# Patient Record
Sex: Female | Born: 1945
Health system: Southern US, Community
[De-identification: ages and names within clinical notes are randomized; demographics above are authoritative.]

## PROBLEM LIST (undated history)

## (undated) DIAGNOSIS — Z8719 Personal history of other diseases of the digestive system: Secondary | ICD-10-CM

## (undated) DIAGNOSIS — R112 Nausea with vomiting, unspecified: Secondary | ICD-10-CM

## (undated) DIAGNOSIS — E669 Obesity, unspecified: Secondary | ICD-10-CM

## (undated) DIAGNOSIS — G20A1 Parkinson's disease without dyskinesia, without mention of fluctuations: Secondary | ICD-10-CM

## (undated) DIAGNOSIS — K219 Gastro-esophageal reflux disease without esophagitis: Secondary | ICD-10-CM

## (undated) DIAGNOSIS — R911 Solitary pulmonary nodule: Secondary | ICD-10-CM

## (undated) DIAGNOSIS — R32 Unspecified urinary incontinence: Secondary | ICD-10-CM

## (undated) DIAGNOSIS — F419 Anxiety disorder, unspecified: Secondary | ICD-10-CM

## (undated) DIAGNOSIS — K224 Dyskinesia of esophagus: Secondary | ICD-10-CM

## (undated) DIAGNOSIS — E785 Hyperlipidemia, unspecified: Secondary | ICD-10-CM

## (undated) DIAGNOSIS — G20 Parkinson's disease: Secondary | ICD-10-CM

## (undated) DIAGNOSIS — F329 Major depressive disorder, single episode, unspecified: Secondary | ICD-10-CM

## (undated) DIAGNOSIS — I10 Essential (primary) hypertension: Secondary | ICD-10-CM

## (undated) DIAGNOSIS — J45909 Unspecified asthma, uncomplicated: Secondary | ICD-10-CM

## (undated) DIAGNOSIS — M1990 Unspecified osteoarthritis, unspecified site: Secondary | ICD-10-CM

## (undated) DIAGNOSIS — G14 Postpolio syndrome: Secondary | ICD-10-CM

## (undated) DIAGNOSIS — G4730 Sleep apnea, unspecified: Secondary | ICD-10-CM

## (undated) DIAGNOSIS — F32A Depression, unspecified: Secondary | ICD-10-CM

## (undated) DIAGNOSIS — Z9889 Other specified postprocedural states: Secondary | ICD-10-CM

## (undated) DIAGNOSIS — B029 Zoster without complications: Secondary | ICD-10-CM

## (undated) DIAGNOSIS — C801 Malignant (primary) neoplasm, unspecified: Secondary | ICD-10-CM

## (undated) HISTORY — DX: Anxiety disorder, unspecified: F41.9

## (undated) HISTORY — DX: Solitary pulmonary nodule: R91.1

## (undated) HISTORY — DX: Essential (primary) hypertension: I10

## (undated) HISTORY — PX: APPENDECTOMY: SHX54

## (undated) HISTORY — DX: Gastro-esophageal reflux disease without esophagitis: K21.9

## (undated) HISTORY — PX: CHOLECYSTECTOMY: SHX55

## (undated) HISTORY — DX: Parkinson's disease: G20

## (undated) HISTORY — DX: Parkinson's disease without dyskinesia, without mention of fluctuations: G20.A1

## (undated) HISTORY — DX: Zoster without complications: B02.9

## (undated) HISTORY — DX: Unspecified urinary incontinence: R32

## (undated) HISTORY — DX: Hyperlipidemia, unspecified: E78.5

## (undated) HISTORY — PX: HALLUX VALGUS CORRECTION: SUR315

## (undated) HISTORY — DX: Postpolio syndrome: G14

## (undated) HISTORY — DX: Unspecified osteoarthritis, unspecified site: M19.90

## (undated) HISTORY — PX: FOOT SURGERY: SHX648

## (undated) HISTORY — PX: ABDOMINAL HYSTERECTOMY: SHX81

## (undated) HISTORY — DX: Dyskinesia of esophagus: K22.4

## (undated) HISTORY — DX: Obesity, unspecified: E66.9

## (undated) HISTORY — PX: ROTATOR CUFF REPAIR: SHX139

## (undated) HISTORY — DX: Sleep apnea, unspecified: G47.30

---
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
## 2001-01-21 HISTORY — PX: COLONOSCOPY: SHX174

## 2003-06-12 ENCOUNTER — Other Ambulatory Visit: Payer: Self-pay

## 2003-11-03 ENCOUNTER — Ambulatory Visit: Payer: Self-pay | Admitting: Endocrinology

## 2004-03-22 ENCOUNTER — Ambulatory Visit: Payer: Self-pay | Admitting: Unknown Physician Specialty

## 2004-04-04 ENCOUNTER — Ambulatory Visit: Payer: Self-pay | Admitting: Physician Assistant

## 2004-05-23 ENCOUNTER — Ambulatory Visit: Payer: Self-pay | Admitting: Unknown Physician Specialty

## 2005-02-12 ENCOUNTER — Ambulatory Visit: Payer: Self-pay | Admitting: Gastroenterology

## 2005-03-12 ENCOUNTER — Ambulatory Visit: Payer: Self-pay | Admitting: Unknown Physician Specialty

## 2005-03-12 ENCOUNTER — Other Ambulatory Visit: Payer: Self-pay

## 2005-04-04 ENCOUNTER — Other Ambulatory Visit: Payer: Self-pay

## 2005-04-04 ENCOUNTER — Ambulatory Visit: Payer: Self-pay | Admitting: Unknown Physician Specialty

## 2005-04-05 ENCOUNTER — Inpatient Hospital Stay: Payer: Self-pay | Admitting: Internal Medicine

## 2005-04-18 ENCOUNTER — Ambulatory Visit: Payer: Self-pay | Admitting: Gastroenterology

## 2005-08-07 ENCOUNTER — Emergency Department: Payer: Self-pay

## 2005-08-07 ENCOUNTER — Other Ambulatory Visit: Payer: Self-pay

## 2006-04-08 ENCOUNTER — Ambulatory Visit: Payer: Self-pay | Admitting: Internal Medicine

## 2007-01-06 ENCOUNTER — Ambulatory Visit: Payer: Self-pay | Admitting: Specialist

## 2007-01-22 DIAGNOSIS — B029 Zoster without complications: Secondary | ICD-10-CM

## 2007-01-22 HISTORY — DX: Zoster without complications: B02.9

## 2007-05-20 ENCOUNTER — Ambulatory Visit: Payer: Self-pay | Admitting: Internal Medicine

## 2007-10-21 ENCOUNTER — Ambulatory Visit: Payer: Self-pay | Admitting: Orthopedic Surgery

## 2008-03-30 ENCOUNTER — Ambulatory Visit: Payer: Self-pay | Admitting: Internal Medicine

## 2008-06-15 ENCOUNTER — Ambulatory Visit: Payer: Self-pay | Admitting: Orthopedic Surgery

## 2008-06-23 ENCOUNTER — Ambulatory Visit: Payer: Self-pay | Admitting: Orthopedic Surgery

## 2008-06-23 HISTORY — PX: KNEE ARTHROSCOPY: SUR90

## 2009-02-24 ENCOUNTER — Observation Stay: Payer: Self-pay | Admitting: *Deleted

## 2009-02-27 ENCOUNTER — Ambulatory Visit: Payer: Self-pay | Admitting: Internal Medicine

## 2009-04-04 ENCOUNTER — Ambulatory Visit: Payer: Self-pay | Admitting: Internal Medicine

## 2010-04-17 ENCOUNTER — Ambulatory Visit: Payer: Self-pay | Admitting: Family Medicine

## 2011-01-22 DIAGNOSIS — C801 Malignant (primary) neoplasm, unspecified: Secondary | ICD-10-CM

## 2011-01-22 HISTORY — DX: Malignant (primary) neoplasm, unspecified: C80.1

## 2011-02-05 ENCOUNTER — Ambulatory Visit: Payer: Self-pay | Admitting: Orthopedic Surgery

## 2011-03-04 ENCOUNTER — Ambulatory Visit: Payer: Self-pay | Admitting: Orthopedic Surgery

## 2011-03-04 LAB — BASIC METABOLIC PANEL
Anion Gap: 5 — ABNORMAL LOW (ref 7–16)
BUN: 16 mg/dL (ref 7–18)
Calcium, Total: 9.1 mg/dL (ref 8.5–10.1)
Chloride: 99 mmol/L (ref 98–107)
Co2: 33 mmol/L — ABNORMAL HIGH (ref 21–32)
Creatinine: 0.75 mg/dL (ref 0.60–1.30)
EGFR (African American): >60
EGFR (Non-African Amer.): >60
Glucose: 91 mg/dL (ref 65–99)
Osmolality: 275 (ref 275–301)
Potassium: 3.3 mmol/L — ABNORMAL LOW (ref 3.5–5.1)
Sodium: 137 mmol/L (ref 136–145)

## 2011-03-04 LAB — SEDIMENTATION RATE: Erythrocyte Sed Rate: 23 mm/hr (ref 0–30)

## 2011-03-04 LAB — MRSA PCR SCREENING

## 2011-03-04 LAB — CBC
HCT: 38.2 % (ref 35.0–47.0)
HGB: 12.5 g/dL (ref 12.0–16.0)
MCH: 28.5 pg (ref 26.0–34.0)
MCHC: 32.7 g/dL (ref 32.0–36.0)
MCV: 87 fL (ref 80–100)
Platelet: 301 10*3/uL (ref 150–440)
RBC: 4.39 10*6/uL (ref 3.80–5.20)
RDW: 12.3 % (ref 11.5–14.5)
WBC: 5.1 10*3/uL (ref 3.6–11.0)

## 2011-03-04 LAB — APTT: Activated PTT: 30.9 secs (ref 23.6–35.9)

## 2011-03-04 LAB — PROTIME-INR
INR: 0.9
Prothrombin Time: 12.4 secs (ref 11.5–14.7)

## 2011-03-12 ENCOUNTER — Inpatient Hospital Stay: Payer: Self-pay | Admitting: Orthopedic Surgery

## 2011-03-12 LAB — CREATININE, SERUM
Creatinine: 0.78 mg/dL (ref 0.60–1.30)
EGFR (African American): >60
EGFR (Non-African Amer.): >60

## 2011-03-12 LAB — HEMOGLOBIN: HGB: 10.9 g/dL — ABNORMAL LOW (ref 12.0–16.0)

## 2011-03-12 LAB — POTASSIUM: Potassium: 3.9 mmol/L (ref 3.5–5.1)

## 2011-03-13 LAB — BASIC METABOLIC PANEL
Anion Gap: 8 (ref 7–16)
BUN: 10 mg/dL (ref 7–18)
Calcium, Total: 7.8 mg/dL — ABNORMAL LOW (ref 8.5–10.1)
Chloride: 101 mmol/L (ref 98–107)
Co2: 29 mmol/L (ref 21–32)
Creatinine: 0.65 mg/dL (ref 0.60–1.30)
EGFR (African American): >60
EGFR (Non-African Amer.): >60
Glucose: 119 mg/dL — ABNORMAL HIGH (ref 65–99)
Osmolality: 276 (ref 275–301)
Potassium: 3.2 mmol/L — ABNORMAL LOW (ref 3.5–5.1)
Sodium: 138 mmol/L (ref 136–145)

## 2011-03-13 LAB — PLATELET COUNT: Platelet: 200 10*3/uL (ref 150–440)

## 2011-03-14 ENCOUNTER — Encounter: Payer: Self-pay | Admitting: Internal Medicine

## 2011-03-14 LAB — PATHOLOGY REPORT

## 2011-03-14 LAB — POTASSIUM: Potassium: 3.4 mmol/L — ABNORMAL LOW (ref 3.5–5.1)

## 2011-03-15 LAB — POTASSIUM: Potassium: 3.8 mmol/L (ref 3.5–5.1)

## 2011-03-22 ENCOUNTER — Encounter: Payer: Self-pay | Admitting: Internal Medicine

## 2011-03-26 LAB — COMPREHENSIVE METABOLIC PANEL
Albumin: 3.2 g/dL — ABNORMAL LOW (ref 3.4–5.0)
Alkaline Phosphatase: 103 U/L (ref 50–136)
Anion Gap: 7 (ref 7–16)
BUN: 15 mg/dL (ref 7–18)
Bilirubin,Total: 0.3 mg/dL (ref 0.2–1.0)
Calcium, Total: 9.2 mg/dL (ref 8.5–10.1)
Chloride: 99 mmol/L (ref 98–107)
Co2: 32 mmol/L (ref 21–32)
Creatinine: 0.75 mg/dL (ref 0.60–1.30)
EGFR (African American): >60
EGFR (Non-African Amer.): >60
Glucose: 90 mg/dL (ref 65–99)
Osmolality: 276 (ref 275–301)
Potassium: 4 mmol/L (ref 3.5–5.1)
SGOT(AST): 20 U/L (ref 15–37)
SGPT (ALT): 20 U/L
Sodium: 138 mmol/L (ref 136–145)
Total Protein: 6.5 g/dL (ref 6.4–8.2)

## 2011-04-22 ENCOUNTER — Encounter: Payer: Self-pay | Admitting: Internal Medicine

## 2011-05-07 ENCOUNTER — Ambulatory Visit: Payer: Self-pay | Admitting: Family Medicine

## 2011-05-22 ENCOUNTER — Encounter: Payer: Self-pay | Admitting: Internal Medicine

## 2011-06-18 ENCOUNTER — Ambulatory Visit: Payer: Self-pay

## 2011-06-22 ENCOUNTER — Encounter: Payer: Self-pay | Admitting: Internal Medicine

## 2011-08-22 HISTORY — PX: ESOPHAGOGASTRODUODENOSCOPY: SHX1529

## 2011-08-30 ENCOUNTER — Ambulatory Visit: Payer: Self-pay | Admitting: Gastroenterology

## 2011-09-02 LAB — PATHOLOGY REPORT

## 2012-01-22 HISTORY — PX: URINARY SURGERY: SHX2626

## 2012-05-06 ENCOUNTER — Ambulatory Visit: Payer: Self-pay | Admitting: Family Medicine

## 2012-11-05 DIAGNOSIS — K219 Gastro-esophageal reflux disease without esophagitis: Secondary | ICD-10-CM

## 2012-11-05 DIAGNOSIS — R911 Solitary pulmonary nodule: Secondary | ICD-10-CM

## 2012-11-05 HISTORY — DX: Solitary pulmonary nodule: R91.1

## 2012-11-05 HISTORY — DX: Gastro-esophageal reflux disease without esophagitis: K21.9

## 2012-12-21 HISTORY — PX: CATARACT EXTRACTION: SUR2

## 2013-01-21 DIAGNOSIS — J45909 Unspecified asthma, uncomplicated: Secondary | ICD-10-CM

## 2013-01-21 HISTORY — DX: Unspecified asthma, uncomplicated: J45.909

## 2013-05-06 ENCOUNTER — Ambulatory Visit: Payer: Self-pay | Admitting: Family Medicine

## 2014-01-24 DIAGNOSIS — S82035D Nondisplaced transverse fracture of left patella, subsequent encounter for closed fracture with routine healing: Secondary | ICD-10-CM | POA: Diagnosis not present

## 2014-01-31 DIAGNOSIS — H1032 Unspecified acute conjunctivitis, left eye: Secondary | ICD-10-CM | POA: Diagnosis not present

## 2014-03-09 DIAGNOSIS — R000 Tachycardia, unspecified: Secondary | ICD-10-CM | POA: Diagnosis not present

## 2014-03-09 DIAGNOSIS — I10 Essential (primary) hypertension: Secondary | ICD-10-CM | POA: Diagnosis not present

## 2014-03-09 DIAGNOSIS — R002 Palpitations: Secondary | ICD-10-CM | POA: Diagnosis not present

## 2014-03-09 DIAGNOSIS — F331 Major depressive disorder, recurrent, moderate: Secondary | ICD-10-CM | POA: Diagnosis not present

## 2014-03-09 DIAGNOSIS — G4700 Insomnia, unspecified: Secondary | ICD-10-CM | POA: Diagnosis not present

## 2014-03-09 DIAGNOSIS — R5383 Other fatigue: Secondary | ICD-10-CM | POA: Diagnosis not present

## 2014-03-09 DIAGNOSIS — Z0000 Encounter for general adult medical examination without abnormal findings: Secondary | ICD-10-CM | POA: Diagnosis not present

## 2014-03-10 DIAGNOSIS — R002 Palpitations: Secondary | ICD-10-CM | POA: Diagnosis not present

## 2014-03-10 DIAGNOSIS — R0602 Shortness of breath: Secondary | ICD-10-CM | POA: Diagnosis not present

## 2014-03-10 DIAGNOSIS — R079 Chest pain, unspecified: Secondary | ICD-10-CM | POA: Diagnosis not present

## 2014-03-10 DIAGNOSIS — R000 Tachycardia, unspecified: Secondary | ICD-10-CM | POA: Diagnosis not present

## 2014-03-11 DIAGNOSIS — L570 Actinic keratosis: Secondary | ICD-10-CM | POA: Diagnosis not present

## 2014-03-11 DIAGNOSIS — Z85828 Personal history of other malignant neoplasm of skin: Secondary | ICD-10-CM | POA: Diagnosis not present

## 2014-03-11 DIAGNOSIS — L700 Acne vulgaris: Secondary | ICD-10-CM | POA: Diagnosis not present

## 2014-03-11 DIAGNOSIS — Z08 Encounter for follow-up examination after completed treatment for malignant neoplasm: Secondary | ICD-10-CM | POA: Diagnosis not present

## 2014-03-22 HISTORY — PX: RETINAL DETACHMENT SURGERY: SHX105

## 2014-03-28 DIAGNOSIS — I10 Essential (primary) hypertension: Secondary | ICD-10-CM | POA: Diagnosis not present

## 2014-03-28 DIAGNOSIS — M150 Primary generalized (osteo)arthritis: Secondary | ICD-10-CM | POA: Diagnosis not present

## 2014-03-28 DIAGNOSIS — G4700 Insomnia, unspecified: Secondary | ICD-10-CM | POA: Diagnosis not present

## 2014-03-28 DIAGNOSIS — F331 Major depressive disorder, recurrent, moderate: Secondary | ICD-10-CM | POA: Diagnosis not present

## 2014-03-28 DIAGNOSIS — R000 Tachycardia, unspecified: Secondary | ICD-10-CM | POA: Diagnosis not present

## 2014-03-28 DIAGNOSIS — N959 Unspecified menopausal and perimenopausal disorder: Secondary | ICD-10-CM | POA: Diagnosis not present

## 2014-04-01 DIAGNOSIS — L853 Xerosis cutis: Secondary | ICD-10-CM | POA: Diagnosis not present

## 2014-04-01 DIAGNOSIS — B078 Other viral warts: Secondary | ICD-10-CM | POA: Diagnosis not present

## 2014-04-01 DIAGNOSIS — S20409A Unspecified superficial injuries of unspecified back wall of thorax, initial encounter: Secondary | ICD-10-CM | POA: Diagnosis not present

## 2014-04-01 DIAGNOSIS — Z08 Encounter for follow-up examination after completed treatment for malignant neoplasm: Secondary | ICD-10-CM | POA: Diagnosis not present

## 2014-04-01 DIAGNOSIS — D2271 Melanocytic nevi of right lower limb, including hip: Secondary | ICD-10-CM | POA: Diagnosis not present

## 2014-04-01 DIAGNOSIS — L570 Actinic keratosis: Secondary | ICD-10-CM | POA: Diagnosis not present

## 2014-04-01 DIAGNOSIS — Z85828 Personal history of other malignant neoplasm of skin: Secondary | ICD-10-CM | POA: Diagnosis not present

## 2014-04-01 DIAGNOSIS — L578 Other skin changes due to chronic exposure to nonionizing radiation: Secondary | ICD-10-CM | POA: Diagnosis not present

## 2014-04-01 DIAGNOSIS — L821 Other seborrheic keratosis: Secondary | ICD-10-CM | POA: Diagnosis not present

## 2014-04-20 DIAGNOSIS — H43811 Vitreous degeneration, right eye: Secondary | ICD-10-CM | POA: Diagnosis not present

## 2014-04-29 DIAGNOSIS — H338 Other retinal detachments: Secondary | ICD-10-CM | POA: Diagnosis not present

## 2014-05-02 DIAGNOSIS — H3322 Serous retinal detachment, left eye: Secondary | ICD-10-CM | POA: Diagnosis not present

## 2014-05-02 DIAGNOSIS — H33051 Total retinal detachment, right eye: Secondary | ICD-10-CM | POA: Diagnosis not present

## 2014-05-02 DIAGNOSIS — H3341 Traction detachment of retina, right eye: Secondary | ICD-10-CM | POA: Diagnosis not present

## 2014-05-02 DIAGNOSIS — I10 Essential (primary) hypertension: Secondary | ICD-10-CM | POA: Diagnosis not present

## 2014-05-15 NOTE — Op Note (Signed)
PATIENT NAME:  Susan Shah, Susan Shah MR#:  [id number] DATE OF BIRTH:  04-02-1945  DATE OF PROCEDURE:  03/12/2011  PREOPERATIVE DIAGNOSIS: Severe left knee osteoarthritis.   POSTOPERATIVE DIAGNOSIS: Severe left knee osteoarthritis.   PROCEDURE: Left total knee replacement.   SURGEON: Laurene Footman, MD  ASSISTANT: Dorthula Matas, PA-C.   ANESTHESIA: General.   IMPLANTS: [place] system with a GMK primary tibial tray size 2, a left PS femoral component with a size 1 patella and a size 2, 14 mm PS tibial insert. All components cemented with Simplex P bone cement.   DESCRIPTION OF PROCEDURE: Patient was brought to the Operating Room and after adequate anesthesia was obtained the leg was prepped and draped in the usual sterile fashion with tourniquet applied to the upper thigh. After appropriate patient identification and timeout procedures were completed, the leg was exsanguinated with use of an Esmarch and the tourniquet raised. Following this, a midline skin incision was made with the knee in flexion and a medial parapatellar arthrotomy carried out. Inspection of the knee revealed sclerosis of the medial compartment and extensive wear and exposed bone on both the lateral facet of the patella and trochlea. The lateral compartment also had significant involvement and there was moderate synovitis within the joint. After having completed this initial evaluation the anterior cruciate ligament was excised along with the infrapatellar fat pad and the tibial cut carried out after obtaining adequate exposure releasing the anterior horn of the menisci. The proximal tibial cutting guide from the MyKnee system was utilized and based on bony landmarks off the template drill holes were made proximally and pins placed. The proximal tibia cut was carried out after checking it against the bone model and appeared appropriate. Proximal tibia cut carried out and the tibial bone removed. The femur was approached in a  similar fashion exposing the proximal femur, again with moderate synovitis present. The distal femoral pins were placed as well as anterior and distal and the distal femoral cut carried out. The femur was preoperatively templated to a 3 and this appeared appropriate. The 3 cutting block was applied. Anterior and posterior and chamfer cuts carried out. At this point, the residual posterior horns of the menisci were excised along with the posterior cruciate ligament which appeared somewhat deficient. The tibia sized to a size 2 consistent with templating and the trial placed with the appropriate rotation based on pin left in place from the initial cutting block. With the release of the posterior cruciate ligament and a pre-existing significant lateral collateral laxity a 14 mm spacer was utilized and this gave good stability with trialing. The notch cut was made for the trochlea of the femur and distal femoral drill holes made. At this point, these trial components were removed. The patella was cut with the patellar guide and sized to a size 1 with the trial fitting well. All trial components having been removed at this point the tourniquet was let down to check for hemostasis. Capsular bleeding was noted and cauterized. The tourniquet was raised again for cement fixation. The bony surfaces were thoroughly irrigated and dried. The tibial component was cemented into place first with excess cement removed followed by placing of the 14 mm PS tibial insert with a setscrew with the system. The femoral component was then cemented into place with that leg held in extension. The cement set with the patellar button clamped in place again using bone cement. After the cement was completely hard excess cement was removed with  use of a small osteotome and the knee again thoroughly irrigated. The knee was stable through a range of motion. Full extension was obtained just passively. At the start of the case there had been some  hyperextension of the knee and the knee was out to full extension but not hyperextending. The patella tracked well and the arthrotomy was then closed using a heavy quill suture followed by 2-0 quill subcutaneously and skin staples. Xeroform, 4 x 4's, Webril, Polar Care, Ace wrap, and immobilizer were applied and the patient was sent to recovery in stable condition.   TOTAL TOURNIQUET TIME: 88 minutes.   ESTIMATED BLOOD LOSS: 50 mL.  IV FLUID: 1000.   URINE OUTPUT: 130.   COMPLICATIONS: None.   SPECIMEN: Cut ends of bone. ____________________________ Laurene Footman, MD mjm:cms D: 03/12/2011 20:41:06 ET T: 03/13/2011 08:46:20 ET  JOB#: [id number] cc: Laurene Footman, MD, <Dictator> Laurene Footman MD ELECTRONICALLY SIGNED 03/13/2011 12:39

## 2014-05-15 NOTE — Discharge Summary (Signed)
PATIENT NAME:  Susan Shah, Susan Shah MR#:  [id number] DATE OF BIRTH:  07-Feb-1945  DATE OF ADMISSION:  03/12/2011 DATE OF DISCHARGE:  03/15/2011  ADMITTING DIAGNOSIS: Status post left total knee replacement for degenerative arthritis.   DISCHARGE DIAGNOSES:  1. Status post left total knee replacement for degenerative arthritis.  2. Sore throat.  3. Hypokalemia, improved.   ATTENDING: Dr. Hessie Knows with [facility] orthopedics.   PROCEDURE: On 02/19 the patient underwent left total knee arthroplasty by Dr. Rudene Christians with assistant Dorthula Matas, PA-C.   ANESTHESIA: General with femoral nerve block.   ESTIMATED BLOOD LOSS: 50 mL.  TOURNIQUET TIME: 88 minutes.   SPECIMENS SENT: Cut ends of bone.   OPERATIVE FINDINGS: Severe tricompartmental degenerative joint disease with sclerosis medially. Also inflammatory component.   IMPLANTS: [place] MyKnee .  DRAINS: No drains were placed.   COMPLICATIONS: No complications occurred.   PATIENT HISTORY: Susan Shah is a 69 year old with persistent left knee pain secondary to osteoarthritis. She has had steroid injection and Synvisc in the past without relief as well as physical therapy. Again, she had only minimal relief although she states she feels like she is getting somewhat stronger. Dr. Rudene Christians had discussed with physical therapist that Susan Shah has a total knee replacement. She will probably need to go to rehab because of her history of polio with some profound right leg weakness and hip abduction weakness on the left. She may need parallel bar training to be able to get ambulatory again with crutches.   ALLERGIES AND ADVERSE REACTIONS: [place].   PAST MEDICAL HISTORY:  1. Hypertension. 2. Anxiety.  3. Hyperlipidemia.  4. Shingles.  5. History of polio with profound right leg weakness.   PHYSICAL EXAMINATION: HEART: Regular rate and rhythm. LUNGS: Clear to auscultation. LEFT KNEE: Range of motion 0 to 90 degrees with significant pain. She  has varus deformity that is passively correctable. She has intact distal pulses of the left foot that are 1+. No clonus sign. No unnecessary edema. Sensation to the foot is intact. Of note, she has no pain with hip rotation.   HOSPITAL COURSE: Patient underwent aforementioned procedure on 02/19 and was transferred to the postanesthesia care unit then the orthopedic floor in stable condition. On first day postop patient was tolerating her diet already. She was treated with Xarelto, TED hose and compression boots for deep vein thrombosis prophylaxis. Patient's potassium was somewhat low and she had to be supplemented  couple of days with some more potassium. She is regularly on 10 mEq a day at home. Her potassium would improve to within normal limits and would be 3.8 on the 22nd. Her hemoglobin had been 10.9 on the 19th but this was not followed because the patient is a Jehovah's Witness and would refuse a blood transfusion. Patient really had a stable hospital course. She passed some stool on the 22nd. She worked multiple times with physical therapy while here and ambulated up to 150 feet but was not able to do stairs.   I did see the patient on the 22nd. She was in no acute distress, awake, alert, lying in bed. Her left knee dressing was intact with only a little bit of bloody drainage. Her calf was not significantly tender. Distally she was able to move her foot well but had a bit of numbness which she does have a history of.   CONDITION AT DISCHARGE: Stable.   DISPOSITION: [facility].   DISCHARGE MEDICATIONS:  1. Oxycodone 5 to 10 mg  every four hours as needed for pain.  2. Ambien 5 mg oral at bedtime as needed for insomnia.  3. Dulcolax 10 mg rectal daily as needed for constipation.  4. Milk of magnesia 30 mL oral twice a day as needed for constipation.  5. Antacid DS suspension 30 mL oral every six hours as needed for heartburn.  6. Senokot-S 1 tablet oral twice a day. 7. Oxybutynin ER  tablet 10 mg oral daily. 8. Lopressor 25 mg oral twice a day. 9. Hydrochlorothiazide/losartan 12.5/50 mg 2 tablets per oral every morning. 10. Simvastatin 20 mg oral at bedtime. 11. Vitamin B complex with iron and intrinsic factor 1 capsule oral twice a day with meals.  12. Citalopram 20 mg oral daily. 13. Zetia 10 mg oral at bedtime.  14. Celebrex 200 mg per oral twice a day as needed for pain.  15. Nexium 40 mg oral daily  16. Lotrisone cream apply to affected area twice a day as needed for rash.  17. Tylenol 500 to 1000 mg every six hours as needed for pain or fever.  18. K-Dur 10 mEq daily per oral. 19. Xarelto 10 mg daily, stop in 10 days from 02/21.  20. Menthol lozenge 1 to 2 q.6 hours as needed for cough or sore throat.   DISCHARGE INSTRUCTIONS AND FOLLOW UP:  1. Sterile dressing changes to the left knee as needed for drainage.  2. Universal skin precautions.  3. TED hose thigh-high are to be worn daily four weeks from surgery date.  4. Apply Polar Care to left knee while patient is sitting or lying down.  5. Regular diet.  6. Physical therapy consult. Weight-bearing as tolerated on the surgical leg.  7. The patient is to follow up at [facility] on 03/05 at 2:15 p.m. for left knee staple removal. If there are any questions, please call our office.  ____________________________ Jerrel Ivory. Jadie Comas, [place] jrp:cms D: 03/15/2011 08:00:12 ET T: 03/15/2011 08:13:53 ET JOB#: [id number] cc: Jerrel Ivory. Charlett Nose, [place], <Dictator> [place] ELECTRONICALLY SIGNED 03/15/2011 15:41

## 2014-05-23 DIAGNOSIS — I10 Essential (primary) hypertension: Secondary | ICD-10-CM | POA: Diagnosis not present

## 2014-05-23 DIAGNOSIS — F331 Major depressive disorder, recurrent, moderate: Secondary | ICD-10-CM | POA: Diagnosis not present

## 2014-05-23 DIAGNOSIS — G479 Sleep disorder, unspecified: Secondary | ICD-10-CM | POA: Diagnosis not present

## 2014-05-23 DIAGNOSIS — Z0001 Encounter for general adult medical examination with abnormal findings: Secondary | ICD-10-CM | POA: Diagnosis not present

## 2014-05-23 DIAGNOSIS — E782 Mixed hyperlipidemia: Secondary | ICD-10-CM | POA: Diagnosis not present

## 2014-05-23 DIAGNOSIS — K219 Gastro-esophageal reflux disease without esophagitis: Secondary | ICD-10-CM | POA: Diagnosis not present

## 2014-05-24 DIAGNOSIS — H33001 Unspecified retinal detachment with retinal break, right eye: Secondary | ICD-10-CM | POA: Diagnosis not present

## 2014-05-25 DIAGNOSIS — H33051 Total retinal detachment, right eye: Secondary | ICD-10-CM | POA: Diagnosis not present

## 2014-05-25 DIAGNOSIS — H3521 Other non-diabetic proliferative retinopathy, right eye: Secondary | ICD-10-CM | POA: Diagnosis not present

## 2014-05-25 DIAGNOSIS — Z961 Presence of intraocular lens: Secondary | ICD-10-CM | POA: Diagnosis not present

## 2014-06-01 DIAGNOSIS — Z1231 Encounter for screening mammogram for malignant neoplasm of breast: Secondary | ICD-10-CM | POA: Diagnosis not present

## 2014-06-07 DIAGNOSIS — G479 Sleep disorder, unspecified: Secondary | ICD-10-CM | POA: Diagnosis not present

## 2014-06-07 DIAGNOSIS — G4733 Obstructive sleep apnea (adult) (pediatric): Secondary | ICD-10-CM | POA: Diagnosis not present

## 2014-06-15 DIAGNOSIS — M898X1 Other specified disorders of bone, shoulder: Secondary | ICD-10-CM | POA: Diagnosis not present

## 2014-06-15 DIAGNOSIS — M25521 Pain in right elbow: Secondary | ICD-10-CM | POA: Diagnosis not present

## 2014-06-15 DIAGNOSIS — Z961 Presence of intraocular lens: Secondary | ICD-10-CM | POA: Diagnosis not present

## 2014-06-15 DIAGNOSIS — H33051 Total retinal detachment, right eye: Secondary | ICD-10-CM | POA: Diagnosis not present

## 2014-06-15 DIAGNOSIS — H26491 Other secondary cataract, right eye: Secondary | ICD-10-CM | POA: Diagnosis not present

## 2014-06-15 DIAGNOSIS — M19021 Primary osteoarthritis, right elbow: Secondary | ICD-10-CM | POA: Diagnosis not present

## 2014-06-16 DIAGNOSIS — F331 Major depressive disorder, recurrent, moderate: Secondary | ICD-10-CM | POA: Diagnosis not present

## 2014-06-16 DIAGNOSIS — I10 Essential (primary) hypertension: Secondary | ICD-10-CM | POA: Diagnosis not present

## 2014-06-16 DIAGNOSIS — G4733 Obstructive sleep apnea (adult) (pediatric): Secondary | ICD-10-CM | POA: Diagnosis not present

## 2014-06-16 DIAGNOSIS — M19011 Primary osteoarthritis, right shoulder: Secondary | ICD-10-CM | POA: Diagnosis not present

## 2014-06-21 DIAGNOSIS — M7581 Other shoulder lesions, right shoulder: Secondary | ICD-10-CM | POA: Diagnosis not present

## 2014-06-21 DIAGNOSIS — M7701 Medial epicondylitis, right elbow: Secondary | ICD-10-CM | POA: Diagnosis not present

## 2014-07-05 DIAGNOSIS — G4733 Obstructive sleep apnea (adult) (pediatric): Secondary | ICD-10-CM | POA: Diagnosis not present

## 2014-07-07 DIAGNOSIS — F331 Major depressive disorder, recurrent, moderate: Secondary | ICD-10-CM | POA: Diagnosis not present

## 2014-07-07 DIAGNOSIS — G4700 Insomnia, unspecified: Secondary | ICD-10-CM | POA: Diagnosis not present

## 2014-07-07 DIAGNOSIS — G4733 Obstructive sleep apnea (adult) (pediatric): Secondary | ICD-10-CM | POA: Diagnosis not present

## 2014-07-07 DIAGNOSIS — I10 Essential (primary) hypertension: Secondary | ICD-10-CM | POA: Diagnosis not present

## 2014-07-07 DIAGNOSIS — M150 Primary generalized (osteo)arthritis: Secondary | ICD-10-CM | POA: Diagnosis not present

## 2014-07-15 DIAGNOSIS — Z961 Presence of intraocular lens: Secondary | ICD-10-CM | POA: Diagnosis not present

## 2014-07-15 DIAGNOSIS — H33051 Total retinal detachment, right eye: Secondary | ICD-10-CM | POA: Diagnosis not present

## 2014-07-20 DIAGNOSIS — R0602 Shortness of breath: Secondary | ICD-10-CM | POA: Diagnosis not present

## 2014-07-27 DIAGNOSIS — F329 Major depressive disorder, single episode, unspecified: Secondary | ICD-10-CM | POA: Diagnosis not present

## 2014-07-27 DIAGNOSIS — I10 Essential (primary) hypertension: Secondary | ICD-10-CM | POA: Diagnosis not present

## 2014-07-27 DIAGNOSIS — E669 Obesity, unspecified: Secondary | ICD-10-CM | POA: Diagnosis not present

## 2014-07-27 DIAGNOSIS — K219 Gastro-esophageal reflux disease without esophagitis: Secondary | ICD-10-CM | POA: Diagnosis not present

## 2014-08-01 DIAGNOSIS — G4733 Obstructive sleep apnea (adult) (pediatric): Secondary | ICD-10-CM | POA: Diagnosis not present

## 2014-08-08 DIAGNOSIS — Z961 Presence of intraocular lens: Secondary | ICD-10-CM | POA: Diagnosis not present

## 2014-08-08 DIAGNOSIS — H26491 Other secondary cataract, right eye: Secondary | ICD-10-CM | POA: Diagnosis not present

## 2014-08-08 DIAGNOSIS — H338 Other retinal detachments: Secondary | ICD-10-CM | POA: Diagnosis not present

## 2014-08-18 DIAGNOSIS — I10 Essential (primary) hypertension: Secondary | ICD-10-CM | POA: Diagnosis not present

## 2014-08-18 DIAGNOSIS — G4733 Obstructive sleep apnea (adult) (pediatric): Secondary | ICD-10-CM | POA: Diagnosis not present

## 2014-08-18 DIAGNOSIS — G4700 Insomnia, unspecified: Secondary | ICD-10-CM | POA: Diagnosis not present

## 2014-09-13 DIAGNOSIS — H26491 Other secondary cataract, right eye: Secondary | ICD-10-CM | POA: Diagnosis not present

## 2014-10-31 DIAGNOSIS — Z23 Encounter for immunization: Secondary | ICD-10-CM | POA: Diagnosis not present

## 2014-12-08 DIAGNOSIS — J45991 Cough variant asthma: Secondary | ICD-10-CM | POA: Diagnosis not present

## 2014-12-08 DIAGNOSIS — G14 Postpolio syndrome: Secondary | ICD-10-CM | POA: Diagnosis not present

## 2014-12-08 DIAGNOSIS — G4700 Insomnia, unspecified: Secondary | ICD-10-CM | POA: Diagnosis not present

## 2014-12-08 DIAGNOSIS — G4733 Obstructive sleep apnea (adult) (pediatric): Secondary | ICD-10-CM | POA: Diagnosis not present

## 2014-12-28 DIAGNOSIS — I10 Essential (primary) hypertension: Secondary | ICD-10-CM | POA: Diagnosis not present

## 2014-12-28 DIAGNOSIS — F331 Major depressive disorder, recurrent, moderate: Secondary | ICD-10-CM | POA: Diagnosis not present

## 2014-12-28 DIAGNOSIS — E782 Mixed hyperlipidemia: Secondary | ICD-10-CM | POA: Diagnosis not present

## 2014-12-28 DIAGNOSIS — G4700 Insomnia, unspecified: Secondary | ICD-10-CM | POA: Diagnosis not present

## 2015-01-10 DIAGNOSIS — H903 Sensorineural hearing loss, bilateral: Secondary | ICD-10-CM | POA: Diagnosis not present

## 2015-02-01 DIAGNOSIS — Z0001 Encounter for general adult medical examination with abnormal findings: Secondary | ICD-10-CM | POA: Diagnosis not present

## 2015-02-01 DIAGNOSIS — I10 Essential (primary) hypertension: Secondary | ICD-10-CM | POA: Diagnosis not present

## 2015-02-01 DIAGNOSIS — E782 Mixed hyperlipidemia: Secondary | ICD-10-CM | POA: Diagnosis not present

## 2015-02-01 DIAGNOSIS — E559 Vitamin D deficiency, unspecified: Secondary | ICD-10-CM | POA: Diagnosis not present

## 2015-02-06 DIAGNOSIS — R399 Unspecified symptoms and signs involving the genitourinary system: Secondary | ICD-10-CM | POA: Diagnosis not present

## 2015-02-06 DIAGNOSIS — N390 Urinary tract infection, site not specified: Secondary | ICD-10-CM | POA: Diagnosis not present

## 2015-02-15 DIAGNOSIS — G4733 Obstructive sleep apnea (adult) (pediatric): Secondary | ICD-10-CM | POA: Diagnosis not present

## 2015-02-22 DIAGNOSIS — R399 Unspecified symptoms and signs involving the genitourinary system: Secondary | ICD-10-CM | POA: Diagnosis not present

## 2015-04-13 DIAGNOSIS — G4733 Obstructive sleep apnea (adult) (pediatric): Secondary | ICD-10-CM | POA: Diagnosis not present

## 2015-04-13 DIAGNOSIS — G14 Postpolio syndrome: Secondary | ICD-10-CM | POA: Diagnosis not present

## 2015-04-13 DIAGNOSIS — J45991 Cough variant asthma: Secondary | ICD-10-CM | POA: Diagnosis not present

## 2015-05-30 DIAGNOSIS — Z0001 Encounter for general adult medical examination with abnormal findings: Secondary | ICD-10-CM | POA: Diagnosis not present

## 2015-05-30 DIAGNOSIS — E782 Mixed hyperlipidemia: Secondary | ICD-10-CM | POA: Diagnosis not present

## 2015-05-30 DIAGNOSIS — G4700 Insomnia, unspecified: Secondary | ICD-10-CM | POA: Diagnosis not present

## 2015-05-30 DIAGNOSIS — I10 Essential (primary) hypertension: Secondary | ICD-10-CM | POA: Diagnosis not present

## 2015-05-30 DIAGNOSIS — N393 Stress incontinence (female) (male): Secondary | ICD-10-CM | POA: Diagnosis not present

## 2015-05-30 DIAGNOSIS — F331 Major depressive disorder, recurrent, moderate: Secondary | ICD-10-CM | POA: Diagnosis not present

## 2015-07-28 DIAGNOSIS — M25542 Pain in joints of left hand: Secondary | ICD-10-CM | POA: Diagnosis not present

## 2015-07-28 DIAGNOSIS — M1812 Unilateral primary osteoarthritis of first carpometacarpal joint, left hand: Secondary | ICD-10-CM | POA: Diagnosis not present

## 2015-07-28 DIAGNOSIS — G5602 Carpal tunnel syndrome, left upper limb: Secondary | ICD-10-CM | POA: Diagnosis not present

## 2015-08-04 DIAGNOSIS — M545 Low back pain: Secondary | ICD-10-CM | POA: Diagnosis not present

## 2015-08-07 DIAGNOSIS — E786 Lipoprotein deficiency: Secondary | ICD-10-CM | POA: Diagnosis not present

## 2015-08-07 DIAGNOSIS — M1990 Unspecified osteoarthritis, unspecified site: Secondary | ICD-10-CM | POA: Diagnosis not present

## 2015-08-07 DIAGNOSIS — E669 Obesity, unspecified: Secondary | ICD-10-CM | POA: Diagnosis not present

## 2015-08-07 DIAGNOSIS — G14 Postpolio syndrome: Secondary | ICD-10-CM | POA: Diagnosis not present

## 2015-08-07 DIAGNOSIS — K219 Gastro-esophageal reflux disease without esophagitis: Secondary | ICD-10-CM | POA: Diagnosis not present

## 2015-08-07 DIAGNOSIS — E785 Hyperlipidemia, unspecified: Secondary | ICD-10-CM | POA: Diagnosis not present

## 2015-08-07 DIAGNOSIS — I10 Essential (primary) hypertension: Secondary | ICD-10-CM | POA: Diagnosis not present

## 2015-08-07 DIAGNOSIS — F329 Major depressive disorder, single episode, unspecified: Secondary | ICD-10-CM | POA: Diagnosis not present

## 2015-08-11 DIAGNOSIS — S233XXA Sprain of ligaments of thoracic spine, initial encounter: Secondary | ICD-10-CM | POA: Diagnosis not present

## 2015-08-11 DIAGNOSIS — M9903 Segmental and somatic dysfunction of lumbar region: Secondary | ICD-10-CM | POA: Diagnosis not present

## 2015-08-11 DIAGNOSIS — S335XXA Sprain of ligaments of lumbar spine, initial encounter: Secondary | ICD-10-CM | POA: Diagnosis not present

## 2015-08-11 DIAGNOSIS — M9902 Segmental and somatic dysfunction of thoracic region: Secondary | ICD-10-CM | POA: Diagnosis not present

## 2015-08-15 DIAGNOSIS — M9903 Segmental and somatic dysfunction of lumbar region: Secondary | ICD-10-CM | POA: Diagnosis not present

## 2015-08-15 DIAGNOSIS — S233XXA Sprain of ligaments of thoracic spine, initial encounter: Secondary | ICD-10-CM | POA: Diagnosis not present

## 2015-08-15 DIAGNOSIS — M9902 Segmental and somatic dysfunction of thoracic region: Secondary | ICD-10-CM | POA: Diagnosis not present

## 2015-08-15 DIAGNOSIS — S335XXA Sprain of ligaments of lumbar spine, initial encounter: Secondary | ICD-10-CM | POA: Diagnosis not present

## 2015-08-29 DIAGNOSIS — M9903 Segmental and somatic dysfunction of lumbar region: Secondary | ICD-10-CM | POA: Diagnosis not present

## 2015-08-29 DIAGNOSIS — S335XXA Sprain of ligaments of lumbar spine, initial encounter: Secondary | ICD-10-CM | POA: Diagnosis not present

## 2015-08-29 DIAGNOSIS — S233XXA Sprain of ligaments of thoracic spine, initial encounter: Secondary | ICD-10-CM | POA: Diagnosis not present

## 2015-08-29 DIAGNOSIS — M9902 Segmental and somatic dysfunction of thoracic region: Secondary | ICD-10-CM | POA: Diagnosis not present

## 2015-09-04 DIAGNOSIS — R1310 Dysphagia, unspecified: Secondary | ICD-10-CM | POA: Diagnosis not present

## 2015-09-04 DIAGNOSIS — I10 Essential (primary) hypertension: Secondary | ICD-10-CM | POA: Diagnosis not present

## 2015-09-04 DIAGNOSIS — N393 Stress incontinence (female) (male): Secondary | ICD-10-CM | POA: Diagnosis not present

## 2015-09-04 DIAGNOSIS — K219 Gastro-esophageal reflux disease without esophagitis: Secondary | ICD-10-CM | POA: Diagnosis not present

## 2015-09-05 ENCOUNTER — Other Ambulatory Visit: Payer: Self-pay | Admitting: Nurse Practitioner

## 2015-09-05 DIAGNOSIS — S233XXA Sprain of ligaments of thoracic spine, initial encounter: Secondary | ICD-10-CM | POA: Diagnosis not present

## 2015-09-05 DIAGNOSIS — M9902 Segmental and somatic dysfunction of thoracic region: Secondary | ICD-10-CM | POA: Diagnosis not present

## 2015-09-05 DIAGNOSIS — S335XXA Sprain of ligaments of lumbar spine, initial encounter: Secondary | ICD-10-CM | POA: Diagnosis not present

## 2015-09-05 DIAGNOSIS — M9903 Segmental and somatic dysfunction of lumbar region: Secondary | ICD-10-CM | POA: Diagnosis not present

## 2015-09-05 DIAGNOSIS — K219 Gastro-esophageal reflux disease without esophagitis: Secondary | ICD-10-CM

## 2015-09-11 ENCOUNTER — Ambulatory Visit: Payer: Self-pay

## 2015-09-12 DIAGNOSIS — E2839 Other primary ovarian failure: Secondary | ICD-10-CM | POA: Diagnosis not present

## 2015-09-12 DIAGNOSIS — M79645 Pain in left finger(s): Secondary | ICD-10-CM | POA: Diagnosis not present

## 2015-09-12 DIAGNOSIS — S60052A Contusion of left little finger without damage to nail, initial encounter: Secondary | ICD-10-CM | POA: Diagnosis not present

## 2015-09-12 DIAGNOSIS — Z1231 Encounter for screening mammogram for malignant neoplasm of breast: Secondary | ICD-10-CM | POA: Diagnosis not present

## 2015-09-18 ENCOUNTER — Ambulatory Visit: Admission: RE | Admit: 2015-09-18 | Payer: Medicare Other | Source: Ambulatory Visit

## 2015-09-18 ENCOUNTER — Ambulatory Visit
Admission: RE | Admit: 2015-09-18 | Discharge: 2015-09-18 | Disposition: A | Discharge status: Home / Self Care | Payer: Medicare Other | Source: Ambulatory Visit | Attending: Nurse Practitioner | Admitting: Nurse Practitioner

## 2015-09-18 DIAGNOSIS — K219 Gastro-esophageal reflux disease without esophagitis: Secondary | ICD-10-CM | POA: Diagnosis not present

## 2015-09-18 DIAGNOSIS — R05 Cough: Secondary | ICD-10-CM | POA: Diagnosis not present

## 2015-09-18 DIAGNOSIS — K228 Other specified diseases of esophagus: Secondary | ICD-10-CM | POA: Insufficient documentation

## 2015-09-18 DIAGNOSIS — K449 Diaphragmatic hernia without obstruction or gangrene: Secondary | ICD-10-CM | POA: Insufficient documentation

## 2015-09-19 ENCOUNTER — Other Ambulatory Visit: Payer: Self-pay

## 2015-09-20 DIAGNOSIS — G4733 Obstructive sleep apnea (adult) (pediatric): Secondary | ICD-10-CM | POA: Diagnosis not present

## 2015-09-29 DIAGNOSIS — R262 Difficulty in walking, not elsewhere classified: Secondary | ICD-10-CM | POA: Diagnosis not present

## 2015-09-29 DIAGNOSIS — K219 Gastro-esophageal reflux disease without esophagitis: Secondary | ICD-10-CM | POA: Diagnosis not present

## 2015-09-29 DIAGNOSIS — K5900 Constipation, unspecified: Secondary | ICD-10-CM | POA: Diagnosis not present

## 2015-09-29 DIAGNOSIS — R1310 Dysphagia, unspecified: Secondary | ICD-10-CM | POA: Diagnosis not present

## 2015-09-29 DIAGNOSIS — I10 Essential (primary) hypertension: Secondary | ICD-10-CM | POA: Diagnosis not present

## 2015-10-12 DIAGNOSIS — Z23 Encounter for immunization: Secondary | ICD-10-CM | POA: Diagnosis not present

## 2015-10-12 DIAGNOSIS — G4733 Obstructive sleep apnea (adult) (pediatric): Secondary | ICD-10-CM | POA: Diagnosis not present

## 2015-12-27 DIAGNOSIS — G4733 Obstructive sleep apnea (adult) (pediatric): Secondary | ICD-10-CM | POA: Diagnosis not present

## 2016-01-02 DIAGNOSIS — I10 Essential (primary) hypertension: Secondary | ICD-10-CM | POA: Diagnosis not present

## 2016-01-02 DIAGNOSIS — J45991 Cough variant asthma: Secondary | ICD-10-CM | POA: Diagnosis not present

## 2016-01-02 DIAGNOSIS — K219 Gastro-esophageal reflux disease without esophagitis: Secondary | ICD-10-CM | POA: Diagnosis not present

## 2016-01-02 DIAGNOSIS — F331 Major depressive disorder, recurrent, moderate: Secondary | ICD-10-CM | POA: Diagnosis not present

## 2016-01-02 DIAGNOSIS — J309 Allergic rhinitis, unspecified: Secondary | ICD-10-CM | POA: Diagnosis not present

## 2016-01-05 DIAGNOSIS — Z961 Presence of intraocular lens: Secondary | ICD-10-CM | POA: Diagnosis not present

## 2016-01-05 DIAGNOSIS — H35371 Puckering of macula, right eye: Secondary | ICD-10-CM | POA: Diagnosis not present

## 2016-01-05 DIAGNOSIS — H338 Other retinal detachments: Secondary | ICD-10-CM | POA: Diagnosis not present

## 2016-01-19 DIAGNOSIS — Z961 Presence of intraocular lens: Secondary | ICD-10-CM | POA: Diagnosis not present

## 2016-01-19 DIAGNOSIS — H33051 Total retinal detachment, right eye: Secondary | ICD-10-CM | POA: Diagnosis not present

## 2016-01-22 DIAGNOSIS — M791 Myalgia: Secondary | ICD-10-CM | POA: Diagnosis not present

## 2016-01-22 DIAGNOSIS — J101 Influenza due to other identified influenza virus with other respiratory manifestations: Secondary | ICD-10-CM | POA: Diagnosis not present

## 2016-02-09 DIAGNOSIS — J0100 Acute maxillary sinusitis, unspecified: Secondary | ICD-10-CM | POA: Diagnosis not present

## 2016-02-15 DIAGNOSIS — R0602 Shortness of breath: Secondary | ICD-10-CM | POA: Diagnosis not present

## 2016-02-15 DIAGNOSIS — J45991 Cough variant asthma: Secondary | ICD-10-CM | POA: Diagnosis not present

## 2016-02-15 DIAGNOSIS — G4733 Obstructive sleep apnea (adult) (pediatric): Secondary | ICD-10-CM | POA: Diagnosis not present

## 2016-02-28 DIAGNOSIS — R0602 Shortness of breath: Secondary | ICD-10-CM | POA: Diagnosis not present

## 2016-04-09 DIAGNOSIS — K449 Diaphragmatic hernia without obstruction or gangrene: Secondary | ICD-10-CM | POA: Diagnosis not present

## 2016-04-09 DIAGNOSIS — I10 Essential (primary) hypertension: Secondary | ICD-10-CM | POA: Diagnosis not present

## 2016-04-09 DIAGNOSIS — F331 Major depressive disorder, recurrent, moderate: Secondary | ICD-10-CM | POA: Diagnosis not present

## 2016-04-09 DIAGNOSIS — E782 Mixed hyperlipidemia: Secondary | ICD-10-CM | POA: Diagnosis not present

## 2016-04-09 DIAGNOSIS — G14 Postpolio syndrome: Secondary | ICD-10-CM | POA: Diagnosis not present

## 2016-04-10 DIAGNOSIS — I10 Essential (primary) hypertension: Secondary | ICD-10-CM

## 2016-04-10 DIAGNOSIS — E669 Obesity, unspecified: Secondary | ICD-10-CM

## 2016-04-10 DIAGNOSIS — K219 Gastro-esophageal reflux disease without esophagitis: Secondary | ICD-10-CM

## 2016-04-10 DIAGNOSIS — G14 Postpolio syndrome: Secondary | ICD-10-CM

## 2016-04-10 DIAGNOSIS — F419 Anxiety disorder, unspecified: Secondary | ICD-10-CM

## 2016-04-10 DIAGNOSIS — B029 Zoster without complications: Secondary | ICD-10-CM

## 2016-04-10 DIAGNOSIS — E7800 Pure hypercholesterolemia, unspecified: Secondary | ICD-10-CM

## 2016-04-10 DIAGNOSIS — M1990 Unspecified osteoarthritis, unspecified site: Secondary | ICD-10-CM

## 2016-04-10 DIAGNOSIS — E785 Hyperlipidemia, unspecified: Secondary | ICD-10-CM | POA: Insufficient documentation

## 2016-04-10 DIAGNOSIS — R911 Solitary pulmonary nodule: Secondary | ICD-10-CM

## 2016-04-10 DIAGNOSIS — K224 Dyskinesia of esophagus: Secondary | ICD-10-CM

## 2016-04-10 DIAGNOSIS — R32 Unspecified urinary incontinence: Secondary | ICD-10-CM

## 2016-04-11 ENCOUNTER — Telehealth: Payer: Self-pay

## 2016-04-11 ENCOUNTER — Encounter: Payer: Self-pay | Admitting: General Surgery

## 2016-04-11 ENCOUNTER — Ambulatory Visit (INDEPENDENT_AMBULATORY_CARE_PROVIDER_SITE_OTHER): Payer: Medicare Other | Admitting: General Surgery

## 2016-04-11 VITALS — BP 172/77 | HR 91 | Temp 98.3°F | Ht 63.0 in | Wt 186.6 lb

## 2016-04-11 DIAGNOSIS — K449 Diaphragmatic hernia without obstruction or gangrene: Secondary | ICD-10-CM | POA: Diagnosis not present

## 2016-04-11 DIAGNOSIS — K224 Dyskinesia of esophagus: Secondary | ICD-10-CM | POA: Diagnosis not present

## 2016-04-11 DIAGNOSIS — K219 Gastro-esophageal reflux disease without esophagitis: Secondary | ICD-10-CM | POA: Diagnosis not present

## 2016-04-11 DIAGNOSIS — G8929 Other chronic pain: Secondary | ICD-10-CM

## 2016-04-11 DIAGNOSIS — R1013 Epigastric pain: Secondary | ICD-10-CM

## 2016-04-11 NOTE — Patient Instructions (Signed)
We will schedule the Esophageal Manometry study and call you with the date and time of the appointment. Later today. We will also send a referral to Dr Clydene Laming at [facility] from their office will call you to make an appointment. We will call you with a follow up appointment pending the results of your study. Please call our office if you have questions or concerns.

## 2016-04-11 NOTE — Progress Notes (Signed)
Patient ID: Susan Shah, female   DOB: Jul 08, 1945, 71 y.o.   MRN: [id number]  CC: Epigastric Pain  HPI Susan Shah is a 71 y.o. female who presents to clinic today for evaluation of epigastric pain and a hiatal hernia. Patient has had a long workup over many years and by many providers for her epigastric pain. She's been managed for her GERD. [facility]. Patient reports that over the last many months the intensity and frequency of her epigastric pain has worsened. Her usual PPI stopped providing relief and she was transitioned to a new her PPI. She states she cannot afford the new her PPI. She has occasional vomiting associated with her chest pain with a burning sensation through the back of her throat when that happens. She is currently symptom free without any pain, fevers, chills, nausea, vomiting, chest pain, shortness of breath, diarrhea, constipation. She does have a significant past medical history of polio. Patient had a previous workup for this done approximately 5 years ago which showed some questionable esophageal dysmotility thought to be related to her polio. She's here today because she wants to be fixed and she is tired of dealing with the chest pains. She's also had numerous cardiac workups that it been negative.  HPI  Past Medical History:  Diagnosis Date  . Anxiety   . Esophageal dysmotility   . GERD (gastroesophageal reflux disease) 11/05/2012  . Hyperlipidemia   . Hypertension   . Incontinence of urine   . Lung nodule   . OA (osteoarthritis)   . Obesity   . Post-polio syndrome   . Pulmonary nodule 11/05/2012   [facility]  . Shingles 2009    Past Surgical History:  Procedure Laterality Date  . ABDOMINAL HYSTERECTOMY     Total  . APPENDECTOMY    . CATARACT EXTRACTION Bilateral 12/2012  . CHOLECYSTECTOMY    . COLONOSCOPY  2003  . ESOPHAGOGASTRODUODENOSCOPY  08/2011  . FOOT SURGERY    . HALLUX VALGUS CORRECTION    . KNEE ARTHROSCOPY Left 06/23/2008  . RETINAL  DETACHMENT SURGERY Right 03/2014  . ROTATOR CUFF REPAIR Bilateral   . URINARY SURGERY  2014   [place]    Family History  Problem Relation Age of Onset  . Heart disease Mother   . Heart disease Father   . Heart disease Brother   . Stroke Maternal Grandmother   . Heart disease Maternal Grandfather   . Heart attack Brother     Social History Social History  Substance Use Topics  . Smoking status: Former Research scientist (life sciences)  . Smokeless tobacco: Never Used  . Alcohol use No    Allergies  Allergen Reactions  . Carisoprodol Hives    Current Outpatient Prescriptions  Medication Sig Dispense Refill  . aspirin EC 81 MG tablet Take 81 mg by mouth daily.    . calcium-vitamin D (OSCAL WITH D) 500-200 MG-UNIT tablet Take 1 tablet by mouth daily with breakfast.    . citalopram (CELEXA) 20 MG tablet Take 20 mg by mouth daily.    Marland Kitchen dexlansoprazole (DEXILANT) 60 MG capsule Take 60 mg by mouth daily.    Marland Kitchen losartan-hydrochlorothiazide (HYZAAR) 100-25 MG tablet Take 1 tablet by mouth daily.    . metoCLOPramide (REGLAN) 5 MG/5ML solution Take by mouth 4 (four) times daily -  before meals and at bedtime.    . metoprolol tartrate (LOPRESSOR) 25 MG tablet Take 25 mg by mouth 2 (two) times daily.    Mar[place] Kitchen oxybutynin (DITROPAN-XL) 10 MG  24 hr tablet Take 10 mg by mouth at bedtime.    . potassium chloride SA (K-DUR,KLOR-CON) 20 MEQ tablet Take 20 mEq by mouth daily.    . ranitidine (ZANTAC) 150 MG tablet Take 150 mg by mouth 2 (two) times daily.    . simvastatin (ZOCOR) 40 MG tablet Take 40 mg by mouth daily at 6 PM.     No current facility-administered medications for this visit.      Review of Systems A Multi-point review of systems was asked and was negative except for the findings documented in the history of present illness  Physical Exam Blood pressure (!) 172/77, pulse 91, temperature 98.3 F (36.8 C), temperature source Oral, height 5\' 3"  (1.6 m), weight 84.6 kg (186 lb 9.6 oz). CONSTITUTIONAL:  No acute distress. EYES: Pupils are equal, round, and reactive to light, Sclera are non-icteric. EARS, NOSE, MOUTH AND THROAT: The oropharynx is clear. The oral mucosa is pink and moist. Hearing is intact to voice. LYMPH NODES:  Lymph nodes in the neck are normal. RESPIRATORY:  Lungs are clear. There is normal respiratory effort, with equal breath sounds bilaterally, and without pathologic use of accessory muscles. CARDIOVASCULAR: Heart is regular without murmurs, gallops, or rubs. GI: The abdomen is soft, nontender, and nondistended. There are no palpable masses. There is no hepatosplenomegaly. There are normal bowel sounds in all quadrants. GU: Rectal deferred.   MUSCULOSKELETAL: Right lower extremity in a brace secondary to her polio   SKIN: Turgor is good and there are no pathologic skin lesions or ulcers. NEUROLOGIC: Motor and sensation is grossly normal. Cranial nerves are grossly intact. PSYCH:  Oriented to person, place and time. Affect is normal.  Data Reviewed Most recent imaging is from 6 months ago which shows evidence of a moderate sized hiatal hernia but without reflux seen on the exam. She's not had any labs for over a year. I have personally reviewed the patient's imaging, laboratory findings and medical records.    Assessment    Chronic epigastric pain    Plan    71 year old female with chronic epigastric pain in the setting of gastroesophageal reflux disease, hiatal hernia, polio. Discussed at length with the patient the perceived problem of reflux disease and hiatal hernia and the possible surgeries for fixing it. Also discussed at length that the hiatal hernia repair with a Nissen fundoplication that I am trying to do requires a normal functioning esophagus. Discussed that her most recent manometry, that was years ago, did not show normal functioning esophagus. At this point discussed the importance of repeating her esophageal evaluation to determine whether or not that is  part of the problem. Discussed that should he be found that all she needs a hiatal hernia repair and antireflux surgery but I am more than capable of performing it here. However, if she has any esophageal dysmotility as part of the problem she needs to be evaluated and treated at a tertiary care center. From today's clinic she'll be set up for repeat manometry, pH study, referral to [facility] surgeons for further evaluation as to whether or not surgery can be of benefit or not. All questions answered to the patient's satisfaction. She'll follow up on an as-needed basis pending manometry studies.     Time spent with the patient was 45 minutes, with more than 50% of the time spent in face-to-face education, counseling and care coordination.     Clayburn Pert, MD FACS General Surgeon 04/11/2016, 11:23 AM

## 2016-04-11 NOTE — Telephone Encounter (Signed)
Spoke with patient at this time. Esophageal Manometry with PH study is scheduled 04/24/16. The prep instructions were discussed and number was provided to call the day before for her arrival time. Patient verbalized all instructions.

## 2016-04-16 ENCOUNTER — Telehealth: Payer: Self-pay

## 2016-04-16 NOTE — Telephone Encounter (Signed)
I have faxed a referral to Dr. Rodney Cruise. [facility] at [place] Phone #: [phone] Fax #: [phone] & received a confirmation.    I will follow up within 3-5 days to make sure the appointments have been scheduled.

## 2016-04-17 DIAGNOSIS — M1812 Unilateral primary osteoarthritis of first carpometacarpal joint, left hand: Secondary | ICD-10-CM | POA: Diagnosis not present

## 2016-04-22 NOTE — Telephone Encounter (Signed)
I have faxed a referral to [facility] Phone #: [phone] Fax #: [phone] & received a confirmation.    I will follow up within 3-5 days to make sure the appointments have been scheduled.

## 2016-04-24 ENCOUNTER — Encounter
Admission: RE | Disposition: A | Discharge status: Home / Self Care | Payer: Self-pay | Source: Ambulatory Visit | Attending: Gastroenterology

## 2016-04-24 ENCOUNTER — Ambulatory Visit
Admission: RE | Admit: 2016-04-24 | Discharge: 2016-04-24 | Disposition: A | Discharge status: Home / Self Care | Payer: Medicare Other | Source: Ambulatory Visit | Attending: Gastroenterology | Admitting: Gastroenterology

## 2016-04-24 DIAGNOSIS — K219 Gastro-esophageal reflux disease without esophagitis: Secondary | ICD-10-CM | POA: Diagnosis not present

## 2016-04-24 HISTORY — PX: 24 HOUR PH STUDY: SHX5419

## 2016-04-24 HISTORY — PX: ESOPHAGEAL MANOMETRY: SHX5429

## 2016-04-24 SURGERY — MONITORING, ESOPHAGEAL PH, 24 HOUR

## 2016-04-24 MED ORDER — LIDOCAINE HCL 2 % EX GEL: 1.0000 "application " | Freq: Once | CUTANEOUS | Status: DC

## 2016-04-24 MED ORDER — LIDOCAINE HCL 2 % EX GEL
CUTANEOUS | Status: DC | PRN
  Administered 2016-04-24: 1

## 2016-04-24 MED ORDER — BUTAMBEN-TETRACAINE-BENZOCAINE 2-2-14 % EX AERO
1.0000 | INHALATION_SPRAY | Freq: Once | CUTANEOUS | Status: AC
  Administered 2016-04-24: 1 via TOPICAL

## 2016-04-24 SURGICAL SUPPLY — 2 items
FACESHIELD LNG OPTICON STERILE (SAFETY) IMPLANT
GLOVE BIO SURGEON STRL SZ8 (GLOVE) ×4 IMPLANT

## 2016-04-24 NOTE — Telephone Encounter (Signed)
Appointment has been scheduled for 05/28/16 at 10:20

## 2016-04-25 ENCOUNTER — Encounter: Payer: Self-pay | Admitting: Gastroenterology

## 2016-05-03 ENCOUNTER — Telehealth: Payer: Self-pay | Admitting: General Practice

## 2016-05-03 NOTE — Telephone Encounter (Signed)
Patient is calling to get her results for her Esophageal Manometry that was done on 04/24/16. Please call patient and advice.

## 2016-05-09 ENCOUNTER — Encounter
Admission: RE | Admit: 2016-05-09 | Discharge: 2016-05-09 | Disposition: A | Discharge status: Home / Self Care | Payer: Medicare Other | Source: Ambulatory Visit | Attending: Orthopedic Surgery | Admitting: Orthopedic Surgery

## 2016-05-09 DIAGNOSIS — R911 Solitary pulmonary nodule: Secondary | ICD-10-CM | POA: Insufficient documentation

## 2016-05-09 DIAGNOSIS — R9431 Abnormal electrocardiogram [ECG] [EKG]: Secondary | ICD-10-CM | POA: Insufficient documentation

## 2016-05-09 DIAGNOSIS — Z01810 Encounter for preprocedural cardiovascular examination: Secondary | ICD-10-CM | POA: Diagnosis not present

## 2016-05-09 DIAGNOSIS — K224 Dyskinesia of esophagus: Secondary | ICD-10-CM | POA: Insufficient documentation

## 2016-05-09 DIAGNOSIS — M1990 Unspecified osteoarthritis, unspecified site: Secondary | ICD-10-CM | POA: Insufficient documentation

## 2016-05-09 DIAGNOSIS — G14 Postpolio syndrome: Secondary | ICD-10-CM | POA: Diagnosis not present

## 2016-05-09 DIAGNOSIS — Z01812 Encounter for preprocedural laboratory examination: Secondary | ICD-10-CM | POA: Diagnosis not present

## 2016-05-09 DIAGNOSIS — E669 Obesity, unspecified: Secondary | ICD-10-CM | POA: Diagnosis not present

## 2016-05-09 DIAGNOSIS — R32 Unspecified urinary incontinence: Secondary | ICD-10-CM | POA: Diagnosis not present

## 2016-05-09 DIAGNOSIS — E785 Hyperlipidemia, unspecified: Secondary | ICD-10-CM | POA: Insufficient documentation

## 2016-05-09 DIAGNOSIS — K449 Diaphragmatic hernia without obstruction or gangrene: Secondary | ICD-10-CM | POA: Insufficient documentation

## 2016-05-09 DIAGNOSIS — I10 Essential (primary) hypertension: Secondary | ICD-10-CM | POA: Diagnosis not present

## 2016-05-09 HISTORY — DX: Nausea with vomiting, unspecified: R11.2

## 2016-05-09 HISTORY — DX: Malignant (primary) neoplasm, unspecified: C80.1

## 2016-05-09 HISTORY — DX: Depression, unspecified: F32.A

## 2016-05-09 HISTORY — DX: Other specified postprocedural states: Z98.890

## 2016-05-09 HISTORY — DX: Personal history of other diseases of the digestive system: Z87.19

## 2016-05-09 HISTORY — DX: Major depressive disorder, single episode, unspecified: F32.9

## 2016-05-09 HISTORY — DX: Unspecified asthma, uncomplicated: J45.909

## 2016-05-09 LAB — CBC
HCT: 35.2 % (ref 35.0–47.0)
Hemoglobin: 11.8 g/dL — ABNORMAL LOW (ref 12.0–16.0)
MCH: 28.5 pg (ref 26.0–34.0)
MCHC: 33.7 g/dL (ref 32.0–36.0)
MCV: 84.7 fL (ref 80.0–100.0)
Platelets: 333 10*3/uL (ref 150–440)
RBC: 4.16 MIL/uL (ref 3.80–5.20)
RDW: 13.8 % (ref 11.5–14.5)
WBC: 6.9 10*3/uL (ref 3.6–11.0)

## 2016-05-09 LAB — BASIC METABOLIC PANEL
Anion gap: 7 (ref 5–15)
BUN: 13 mg/dL (ref 6–20)
CO2: 28 mmol/L (ref 22–32)
Calcium: 9 mg/dL (ref 8.9–10.3)
Chloride: 101 mmol/L (ref 101–111)
Creatinine, Ser: 0.64 mg/dL (ref 0.44–1.00)
GFR calc Af Amer: >60 mL/min (ref >60)
GFR calc non Af Amer: >60 mL/min (ref >60)
Glucose, Bld: 83 mg/dL (ref 65–99)
Potassium: 3.6 mmol/L (ref 3.5–5.1)
Sodium: 136 mmol/L (ref 135–145)

## 2016-05-09 NOTE — Patient Instructions (Signed)
  Your procedure is scheduled on: Thursday May 16, 2016. Report to Same Day Surgery. To find out your arrival time please call [phone] between 1PM - 3PM on Wednesday May 15, 2016.  Remember: Instructions that are not followed completely may result in serious medical risk, up to and including death, or upon the discretion of your surgeon and anesthesiologist your surgery may need to be rescheduled.    _x___ 1. Do not eat food or drink liquids after midnight. No gum chewing or hard candies.     ____ 2. No Alcohol for 24 hours before or after surgery.   ____ 3. Bring all medications with you on the day of surgery if instructed.    __x__ 4. Notify your doctor if there is any change in your medical condition     (cold, fever, infections).    _____ 5. No smoking 24 hours prior to surgery.     Do not wear jewelry, make-up, hairpins, clips or nail polish.  Do not wear lotions, powders, or perfumes.   Do not shave 48 hours prior to surgery. Men may shave face and neck.  Do not bring valuables to the hospital.    [facility] is not responsible for any belongings or valuables.               Contacts, dentures or bridgework may not be worn into surgery.  Leave your suitcase in the car. After surgery it may be brought to your room.  For patients admitted to the hospital, discharge time is determined by your treatment team.   Patients discharged the day of surgery will not be allowed to drive home.    Please read over the following fact sheets that you were given:   [facility] Preparing for Surgery  _x_ Take these medicines the morning of surgery with A SIP OF WATER:    1. buPROPion (WELLBUTRIN XL)   2. metoCLOPramide (REGLAN)  3. metoprolol (LOPRESSOR)  4. pantoprazole (PROTONIX)    ____ Fleet Enema (as directed)   _x___ Use CHG Soap as directed on instruction sheet  ____ Use inhalers on the day of surgery and bring to hospital day of surgery  ____ Stop metformin 2 days  prior to surgery    ____ Take 1/2 of usual insulin dose the night before surgery and none on the morning of  surgery.   _x___ Stop aspirin today.  _x___ Stop Anti-inflammatories such DB:ZMCEYEMVVK (VOLTAREN) Advil, Aleve, Ibuprofen, Motrin, Naproxen, Naprosyn, Goodies powders or aspirin  products. OK to take Tylenol.   ____ Stop supplements until after surgery.    _x___ Bring C-Pap to the hospital.

## 2016-05-09 NOTE — Pre-Procedure Instructions (Signed)
EKG COMPARED WITH 2013 

## 2016-05-15 MED ORDER — SCOPOLAMINE 1 MG/3DAYS TD PT72
1.0000 | MEDICATED_PATCH | Freq: Once | TRANSDERMAL | Status: DC
  Administered 2016-05-16: 1.5 mg via TRANSDERMAL

## 2016-05-15 MED ORDER — CEFAZOLIN SODIUM-DEXTROSE 2-4 GM/100ML-% IV SOLN
2.0000 g | Freq: Once | INTRAVENOUS | Status: AC
  Administered 2016-05-16: 2 g via INTRAVENOUS

## 2016-05-16 ENCOUNTER — Encounter
Admission: RE | Disposition: A | Discharge status: Home / Self Care | Payer: Self-pay | Source: Ambulatory Visit | Attending: Orthopedic Surgery

## 2016-05-16 ENCOUNTER — Encounter: Payer: Self-pay | Admitting: *Deleted

## 2016-05-16 ENCOUNTER — Ambulatory Visit
Admission: RE | Admit: 2016-05-16 | Discharge: 2016-05-16 | Disposition: A | Discharge status: Home / Self Care | Payer: Medicare Other | Source: Ambulatory Visit | Attending: Orthopedic Surgery | Admitting: Orthopedic Surgery

## 2016-05-16 ENCOUNTER — Ambulatory Visit: Payer: Medicare Other | Admitting: Anesthesiology

## 2016-05-16 DIAGNOSIS — Z8612 Personal history of poliomyelitis: Secondary | ICD-10-CM | POA: Diagnosis not present

## 2016-05-16 DIAGNOSIS — Z79899 Other long term (current) drug therapy: Secondary | ICD-10-CM | POA: Diagnosis not present

## 2016-05-16 DIAGNOSIS — F419 Anxiety disorder, unspecified: Secondary | ICD-10-CM | POA: Diagnosis not present

## 2016-05-16 DIAGNOSIS — Z87891 Personal history of nicotine dependence: Secondary | ICD-10-CM | POA: Insufficient documentation

## 2016-05-16 DIAGNOSIS — K219 Gastro-esophageal reflux disease without esophagitis: Secondary | ICD-10-CM | POA: Diagnosis not present

## 2016-05-16 DIAGNOSIS — M1812 Unilateral primary osteoarthritis of first carpometacarpal joint, left hand: Secondary | ICD-10-CM | POA: Insufficient documentation

## 2016-05-16 DIAGNOSIS — F329 Major depressive disorder, single episode, unspecified: Secondary | ICD-10-CM | POA: Insufficient documentation

## 2016-05-16 DIAGNOSIS — Z7982 Long term (current) use of aspirin: Secondary | ICD-10-CM | POA: Diagnosis not present

## 2016-05-16 DIAGNOSIS — R079 Chest pain, unspecified: Secondary | ICD-10-CM | POA: Diagnosis not present

## 2016-05-16 DIAGNOSIS — I10 Essential (primary) hypertension: Secondary | ICD-10-CM | POA: Diagnosis not present

## 2016-05-16 DIAGNOSIS — E7800 Pure hypercholesterolemia, unspecified: Secondary | ICD-10-CM | POA: Insufficient documentation

## 2016-05-16 DIAGNOSIS — R05 Cough: Secondary | ICD-10-CM | POA: Diagnosis not present

## 2016-05-16 HISTORY — PX: CARPOMETACARPAL (CMC) FUSION OF THUMB: SHX6290

## 2016-05-16 SURGERY — CARPOMETACARPAL (CMC) FUSION OF THUMB
Anesthesia: General | Site: Thumb | Laterality: Left | Wound class: Clean

## 2016-05-16 MED ORDER — HYDROCODONE-ACETAMINOPHEN 5-325 MG PO TABS
ORAL_TABLET | ORAL | Status: DC
Start: 2016-05-16 — End: 2016-05-16
  Filled 2016-05-16: qty 1

## 2016-05-16 MED ORDER — ONDANSETRON HCL 4 MG/2ML IJ SOLN: 4.0000 mg | Freq: Once | INTRAMUSCULAR | Status: DC | PRN

## 2016-05-16 MED ORDER — HYDROCODONE-ACETAMINOPHEN 5-325 MG PO TABS: 1.0000 | ORAL_TABLET | Freq: Four times a day (QID) | ORAL | 0 refills | Status: DC | PRN

## 2016-05-16 MED ORDER — HYDROMORPHONE HCL 1 MG/ML IJ SOLN
0.2500 mg | INTRAMUSCULAR | Status: DC | PRN
  Administered 2016-05-16 (×4): 0.25 mg via INTRAVENOUS

## 2016-05-16 MED ORDER — NEOMYCIN-POLYMYXIN B GU 40-200000 IR SOLN
Status: AC
  Filled 2016-05-16: qty 2

## 2016-05-16 MED ORDER — FENTANYL CITRATE (PF) 100 MCG/2ML IJ SOLN
INTRAMUSCULAR | Status: AC
  Filled 2016-05-16: qty 2

## 2016-05-16 MED ORDER — GELATIN ABSORBABLE 12-7 MM EX MISC
CUTANEOUS | Status: DC | PRN
  Administered 2016-05-16: 1

## 2016-05-16 MED ORDER — FENTANYL CITRATE (PF) 100 MCG/2ML IJ SOLN
INTRAMUSCULAR | Status: AC
  Administered 2016-05-16: 25 ug via INTRAVENOUS
  Filled 2016-05-16: qty 2

## 2016-05-16 MED ORDER — SEVOFLURANE IN SOLN
RESPIRATORY_TRACT | Status: AC
  Filled 2016-05-16: qty 250

## 2016-05-16 MED ORDER — HYDROCODONE-ACETAMINOPHEN 5-325 MG PO TABS
1.0000 | ORAL_TABLET | ORAL | Status: DC | PRN
  Administered 2016-05-16: 1 via ORAL

## 2016-05-16 MED ORDER — GELATIN ABSORBABLE 12-7 MM EX MISC
CUTANEOUS | Status: AC
  Filled 2016-05-16: qty 1

## 2016-05-16 MED ORDER — BUPIVACAINE HCL (PF) 0.5 % IJ SOLN
INTRAMUSCULAR | Status: AC
  Filled 2016-05-16: qty 30

## 2016-05-16 MED ORDER — ONDANSETRON HCL 4 MG PO TABS: 4.0000 mg | ORAL_TABLET | Freq: Four times a day (QID) | ORAL | Status: DC | PRN

## 2016-05-16 MED ORDER — ONDANSETRON HCL 4 MG/2ML IJ SOLN
INTRAMUSCULAR | Status: DC | PRN
  Administered 2016-05-16: 4 mg via INTRAVENOUS

## 2016-05-16 MED ORDER — FENTANYL CITRATE (PF) 100 MCG/2ML IJ SOLN
INTRAMUSCULAR | Status: DC | PRN
  Administered 2016-05-16: 25 ug via INTRAVENOUS
  Administered 2016-05-16: 50 ug via INTRAVENOUS
  Administered 2016-05-16: 25 ug via INTRAVENOUS

## 2016-05-16 MED ORDER — CEFAZOLIN SODIUM-DEXTROSE 2-4 GM/100ML-% IV SOLN
INTRAVENOUS | Status: AC
  Filled 2016-05-16: qty 100

## 2016-05-16 MED ORDER — FENTANYL CITRATE (PF) 100 MCG/2ML IJ SOLN
25.0000 ug | INTRAMUSCULAR | Status: DC | PRN
  Administered 2016-05-16: 50 ug via INTRAVENOUS
  Administered 2016-05-16: 25 ug via INTRAVENOUS
  Administered 2016-05-16: 50 ug via INTRAVENOUS
  Administered 2016-05-16: 25 ug via INTRAVENOUS
  Administered 2016-05-16: 50 ug via INTRAVENOUS

## 2016-05-16 MED ORDER — EPHEDRINE SULFATE 50 MG/ML IJ SOLN
INTRAMUSCULAR | Status: DC | PRN
  Administered 2016-05-16: 10 mg via INTRAVENOUS

## 2016-05-16 MED ORDER — LIDOCAINE HCL (CARDIAC) 20 MG/ML IV SOLN
INTRAVENOUS | Status: DC | PRN
  Administered 2016-05-16: 60 mg via INTRAVENOUS

## 2016-05-16 MED ORDER — DEXAMETHASONE SODIUM PHOSPHATE 10 MG/ML IJ SOLN
INTRAMUSCULAR | Status: DC | PRN
  Administered 2016-05-16: 10 mg via INTRAVENOUS

## 2016-05-16 MED ORDER — PROPOFOL 10 MG/ML IV BOLUS
INTRAVENOUS | Status: AC
  Filled 2016-05-16: qty 20

## 2016-05-16 MED ORDER — FENTANYL CITRATE (PF) 100 MCG/2ML IJ SOLN
INTRAMUSCULAR | Status: AC
  Administered 2016-05-16: 50 ug via INTRAVENOUS
  Filled 2016-05-16: qty 2

## 2016-05-16 MED ORDER — LIDOCAINE HCL (PF) 2 % IJ SOLN
INTRAMUSCULAR | Status: AC
  Filled 2016-05-16: qty 2

## 2016-05-16 MED ORDER — BUPIVACAINE HCL (PF) 0.5 % IJ SOLN
INTRAMUSCULAR | Status: DC | PRN
  Administered 2016-05-16: 8 mL

## 2016-05-16 MED ORDER — PROPOFOL 10 MG/ML IV BOLUS
INTRAVENOUS | Status: DC | PRN
  Administered 2016-05-16: 150 mg via INTRAVENOUS

## 2016-05-16 MED ORDER — ACETAMINOPHEN 10 MG/ML IV SOLN
INTRAVENOUS | Status: DC | PRN
  Administered 2016-05-16: 1000 mg via INTRAVENOUS

## 2016-05-16 MED ORDER — METOCLOPRAMIDE HCL 10 MG PO TABS: 5.0000 mg | ORAL_TABLET | Freq: Three times a day (TID) | ORAL | Status: DC | PRN

## 2016-05-16 MED ORDER — FENTANYL CITRATE (PF) 100 MCG/2ML IJ SOLN: 50.0000 ug | Freq: Once | INTRAMUSCULAR | Status: DC

## 2016-05-16 MED ORDER — DEXAMETHASONE SODIUM PHOSPHATE 10 MG/ML IJ SOLN
INTRAMUSCULAR | Status: AC
  Filled 2016-05-16: qty 1

## 2016-05-16 MED ORDER — NEOMYCIN-POLYMYXIN B GU 40-200000 IR SOLN
Status: DC | PRN
  Administered 2016-05-16: 2 mL

## 2016-05-16 MED ORDER — LACTATED RINGERS IV SOLN
INTRAVENOUS | Status: DC
  Administered 2016-05-16: 10:00:00 via INTRAVENOUS

## 2016-05-16 MED ORDER — ONDANSETRON HCL 4 MG/2ML IJ SOLN: 4.0000 mg | Freq: Four times a day (QID) | INTRAMUSCULAR | Status: DC | PRN

## 2016-05-16 MED ORDER — SCOPOLAMINE 1 MG/3DAYS TD PT72
MEDICATED_PATCH | TRANSDERMAL | Status: AC
  Administered 2016-05-16: 1.5 mg via TRANSDERMAL
  Filled 2016-05-16: qty 1

## 2016-05-16 MED ORDER — METOCLOPRAMIDE HCL 5 MG/ML IJ SOLN: 5.0000 mg | Freq: Three times a day (TID) | INTRAMUSCULAR | Status: DC | PRN

## 2016-05-16 MED ORDER — EPHEDRINE SULFATE 50 MG/ML IJ SOLN
INTRAMUSCULAR | Status: AC
  Filled 2016-05-16: qty 1

## 2016-05-16 MED ORDER — ACETAMINOPHEN 10 MG/ML IV SOLN
INTRAVENOUS | Status: AC
  Filled 2016-05-16: qty 100

## 2016-05-16 MED ORDER — SODIUM CHLORIDE 0.9 % IV SOLN: INTRAVENOUS | Status: DC

## 2016-05-16 MED ORDER — HYDROMORPHONE HCL 1 MG/ML IJ SOLN
INTRAMUSCULAR | Status: AC
  Administered 2016-05-16: 0.25 mg via INTRAVENOUS
  Filled 2016-05-16: qty 1

## 2016-05-16 SURGICAL SUPPLY — 37 items
BANDAGE ELASTIC 3 LF NS (GAUZE/BANDAGES/DRESSINGS) ×2 IMPLANT
BANDAGE STRETCH 3X4.1 STRL (GAUZE/BANDAGES/DRESSINGS) ×2 IMPLANT
BLADE OSC/SAGITTAL 5.5X25 (BLADE) ×2 IMPLANT
CAST PADDING 3X4FT ST 30246 (SOFTGOODS) ×1
CUFF TOURN 18 STER (MISCELLANEOUS) ×2 IMPLANT
DRAPE FLUOR MINI C-ARM 54X84 (DRAPES) ×2 IMPLANT
ELECT CAUTERY BLADE 6.4 (BLADE) ×2 IMPLANT
GAUZE PETRO XEROFOAM 1X8 (MISCELLANEOUS) ×2 IMPLANT
GAUZE SPONGE 4X4 12PLY STRL (GAUZE/BANDAGES/DRESSINGS) ×2 IMPLANT
GAUZE XEROFORM 4X4 STRL (GAUZE/BANDAGES/DRESSINGS) ×2 IMPLANT
GLOVE BIOGEL PI IND STRL 6.5 (GLOVE) ×2 IMPLANT
GLOVE BIOGEL PI IND STRL 9 (GLOVE) ×1 IMPLANT
GLOVE BIOGEL PI INDICATOR 6.5 (GLOVE) ×2
GLOVE BIOGEL PI INDICATOR 9 (GLOVE) ×1
GLOVE SURG SYN 9.0  PF PI (GLOVE) ×1
GLOVE SURG SYN 9.0 PF PI (GLOVE) ×1 IMPLANT
GOWN SRG 2XL LVL 4 RGLN SLV (GOWNS) ×1 IMPLANT
GOWN STRL NON-REIN 2XL LVL4 (GOWNS) ×1
GOWN STRL REUS W/ TWL LRG LVL3 (GOWN DISPOSABLE) ×1 IMPLANT
GOWN STRL REUS W/TWL LRG LVL3 (GOWN DISPOSABLE) ×1
KIT RM TURNOVER STRD PROC AR (KITS) ×2 IMPLANT
NDL SAFETY 25GX1.5 (NEEDLE) ×2 IMPLANT
NS IRRIG 500ML POUR BTL (IV SOLUTION) ×2 IMPLANT
PACK EXTREMITY ARMC (MISCELLANEOUS) ×2 IMPLANT
PAD CAST CTTN 3X4 STRL (SOFTGOODS) ×1 IMPLANT
SPLINT CAST 1 STEP 3X12 (MISCELLANEOUS) ×2 IMPLANT
SPLINT WRIST M LT TX990308 (SOFTGOODS) IMPLANT
SPONGE SURGIFOAM ABS GEL 12-7 (HEMOSTASIS) ×2 IMPLANT
SUT ETHILON 4-0 (SUTURE) ×1
SUT ETHILON 4-0 FS2 18XMFL BLK (SUTURE) ×1
SUT VIC AB 0 CT2 27 (SUTURE) ×2 IMPLANT
SUT VIC AB 3-0 SH 27 (SUTURE) ×1
SUT VIC AB 3-0 SH 27X BRD (SUTURE) ×1 IMPLANT
SUTURE ETHLN 4-0 FS2 18XMF BLK (SUTURE) ×1 IMPLANT
SYSTEM IMPLANT TIGHTROPE MINI (Anchor) ×2 IMPLANT
WIRE Z .035 C-WIRE SPADE TIP (WIRE) IMPLANT
WIRE Z .062 C-WIRE SPADE TIP (WIRE) IMPLANT

## 2016-05-16 NOTE — Anesthesia Procedure Notes (Signed)
Procedure Name: LMA Insertion Date/Time: 05/16/2016 11:30 AM Performed by: Dionne Bucy Pre-anesthesia Checklist: Patient identified, Patient being monitored, Timeout performed, Emergency Drugs available and Suction available Patient Re-evaluated:Patient Re-evaluated prior to inductionOxygen Delivery Method: Circle system utilized Preoxygenation: Pre-oxygenation with 100% oxygen Intubation Type: IV induction Ventilation: Mask ventilation without difficulty LMA: LMA inserted LMA Size: 4.0 Tube type: Oral Number of attempts: 1 Placement Confirmation: positive ETCO2 and breath sounds checked- equal and bilateral Tube secured with: Tape Dental Injury: Teeth and Oropharynx as per pre-operative assessment

## 2016-05-16 NOTE — Anesthesia Post-op Follow-up Note (Cosign Needed)
Anesthesia QCDR form completed.        

## 2016-05-16 NOTE — H&P (Signed)
Reviewed paper H+P, will be scanned into chart. Patient examined No changes noted.  

## 2016-05-16 NOTE — Discharge Instructions (Addendum)
Keep arm elevated as much as possible. Pain medicine as directed. Work on finger range of motion but don't worry about moving the thumb this weekend. If any problems, call physician office.  Numbing medicine was injected at the end of surgery and  some numbness to the thumb   AMBULATORY SURGERY  DISCHARGE INSTRUCTIONS   1) The drugs that you were given will stay in your system until tomorrow so for the next 24 hours you should not:  A) Drive an automobile B) Make any legal decisions C) Drink any alcoholic beverage   2) You may resume regular meals tomorrow.  Today it is better to start with liquids and gradually work up to solid foods.  You may eat anything you prefer, but it is better to start with liquids, then soup and crackers, and gradually work up to solid foods.   3) Please notify your doctor immediately if you have any unusual bleeding, trouble breathing, redness and pain at the surgery site, drainage, fever, or pain not relieved by medication.    4) Additional Instructions: STOOL SOFTENERS IF TAKING PAIN MEDICATION ON ANY REGULAR BASIS. May use Tylenol/ibuprofen in between doses or along with dose of pain  Medication to enhance the effects.   Please contact your physician with any problems or Same Day Surgery at [phone], Monday through Friday 6 am to 4 pm, or [place] at [facility] number at [phone].

## 2016-05-16 NOTE — Anesthesia Preprocedure Evaluation (Signed)
Anesthesia Evaluation  Patient identified by MRN, date of birth, ID band Patient awake    Reviewed: Allergy & Precautions, H&P , NPO status , Patient's Chart, lab work & pertinent test results, reviewed documented beta blocker date and time   History of Anesthesia Complications (+) PONV and history of anesthetic complications  Airway Mallampati: III  TM Distance: >3 FB Neck ROM: full    Dental  (+) Caps, Missing, Chipped, Poor Dentition   Pulmonary neg shortness of breath, asthma , neg sleep apnea, neg COPD, neg recent URI, former smoker,  RML nodule, being watched          Cardiovascular Exercise Tolerance: Good hypertension, (-) angina(-) CAD, (-) Past MI, (-) Cardiac Stents and (-) CABG (-) dysrhythmias (-) Valvular Problems/Murmurs     Neuro/Psych PSYCHIATRIC DISORDERS (Depression) negative neurological ROS     GI/Hepatic Neg liver ROS, hiatal hernia, GERD  ,  Endo/Other  negative endocrine ROS  Renal/GU negative Renal ROS  negative genitourinary   Musculoskeletal   Abdominal   Peds  Hematology negative hematology ROS (+)   Anesthesia Other Findings Past Medical History: No date: Anxiety 2015: Asthma     Comment: mild, seasonal allergy triggered. 2013: Cancer [facility])     Comment: skin cancer  on left hand No date: Depression No date: Esophageal dysmotility 11/05/2012: GERD (gastroesophageal reflux disease) No date: History of hiatal hernia No date: Hyperlipidemia No date: Hypertension No date: Incontinence of urine No date: Lung nodule No date: OA (osteoarthritis) No date: Obesity No date: PONV (postoperative nausea and vomiting) No date: Post-polio syndrome     Comment: contracted at 64 months old 11/05/2012: Pulmonary nodule     Comment: RML 2009: Shingles   Reproductive/Obstetrics negative OB ROS                             Anesthesia Physical Anesthesia Plan  ASA:  II  Anesthesia Plan: General   Post-op Pain Management:    Induction:   Airway Management Planned:   Additional Equipment:   Intra-op Plan:   Post-operative Plan:   Informed Consent: I have reviewed the patients History and Physical, chart, labs and discussed the procedure including the risks, benefits and alternatives for the proposed anesthesia with the patient or authorized representative who has indicated his/her understanding and acceptance.   Dental Advisory Given  Plan Discussed with: Anesthesiologist, CRNA and Surgeon  Anesthesia Plan Comments:         Anesthesia Quick Evaluation

## 2016-05-16 NOTE — OR Nursing (Signed)
Worked with patient to determine if she could use her brace/crutches on both arms. She was able to maneuver keeping the left brace/crutch just under her left arm and not holding on with her surgical hand.

## 2016-05-16 NOTE — Op Note (Signed)
05/16/2016  12:52 PM  PATIENT:  Susan Shah  71 y.o. female  PRE-OPERATIVE DIAGNOSIS:  PRIMARY OSTEOARTHRITIS OF FIRST CARPOMETACARPAL JOINT OF LEFT HAND  POST-OPERATIVE DIAGNOSIS:  PRIMARY OSTEOARTHRITIS OF FIRST CARPOMETACARPAL JOINT OF LEFT HAND  PROCEDURE:  Procedure(s): CARPOMETACARPAL (CMC) FUSION OF THUMB (Left) arthroplasty not fusion  SURGEON: Laurene Footman, MD  ASSISTANTS: none  ANESTHESIA:   general  EBL:  Total I/O In: -  Out: 3 [Blood:3]  BLOOD ADMINISTERED:none  DRAINS: none   LOCAL MEDICATIONS USED:  MARCAINE     SPECIMEN:  No Specimen  DISPOSITION OF SPECIMEN:  N/A  COUNTS:  YES  TOURNIQUET:   60 minutes at 250 mmHg  IMPLANTS: Mini tight rope  DICTATION: .Dragon Dictation patient brought the operating room and after adequate anesthesia was obtained the left arm was prepped and draped in sterile fashion. After patient identification and timeout procedures were completed tourniquet was raised. Incision was made on the radial side of the wrist at the base of the first metacarpal extending proximally. Care was taken to preserve skin nurse. The capsule was identified and opened and the trapezium exposed. There is a great deal deformity present and spurring. The trapezium was quartered and removed with large spurs still left and these were removed before the close the case. With the trapezium removed and in particular large spur between the first and second metacarpals the wire could be passed for tight rope fixation. A small incision was made at the distal second metacarpal and a start hole made in the bone in the midportion for application of the guide within C-clamp this was applied and the wire passed without difficulty from the base of the first metacarpal into the second metacarpal the midline the implant was then passed and the tight rope anchor sutures placed and tightened proximally at the distal second metacarpal this was done with the thumb in about 45  of abduction and extension after modifying this to get the appropriate soft tissue tension the sutures were tightened and multiple sutures placed with the sutures cut distally the stable medical CMC joint was stable to the traction and compression under fluoroscopic views. The defect from the trapezium to be was filled with Gelfoam capsule was repaired with 3-0 Vicryl followed by 4-0 nylon for the skin. 8 cc of half percent Sensorcaine were infiltrated around the incision for postop analgesia. Xeroform 4 x 4 web roll and a thumb spica splint were then applied and tourniquet let down.  PLAN OF CARE: Discharge to home after PACU  PATIENT DISPOSITION:  PACU - hemodynamically stable.

## 2016-05-16 NOTE — Transfer of Care (Signed)
Immediate Anesthesia Transfer of Care Note  Patient: Susan Shah  Procedure(s) Performed: Procedure(s): CARPOMETACARPAL ([place]) FUSION OF THUMB (Left)  Patient Location: PACU  Anesthesia Type:General  Level of Consciousness: awake  Airway & Oxygen Therapy: Patient Spontanous Breathing and Patient connected to face mask oxygen  Post-op Assessment: Report given to RN and Post -op Vital signs reviewed and stable  Post vital signs: Reviewed and stable  Last Vitals:  Vitals:   05/16/16 0909  BP: (!) 106/91  Pulse: 76  Resp: 20  Temp: 36.1 C    Last Pain:  Vitals:   05/16/16 0909  TempSrc: Tympanic  PainSc: 4          Complications: No apparent anesthesia complications

## 2016-05-17 ENCOUNTER — Encounter: Payer: Self-pay | Admitting: Orthopedic Surgery

## 2016-05-17 NOTE — Anesthesia Postprocedure Evaluation (Signed)
Anesthesia Post Note  Patient: Susan Shah  Procedure(s) Performed: Procedure(s) (LRB): CARPOMETACARPAL ([place]) FUSION OF THUMB (Left)  Patient location during evaluation: PACU Anesthesia Type: General Level of consciousness: awake and alert Pain management: pain level controlled Vital Signs Assessment: post-procedure vital signs reviewed and stable Respiratory status: spontaneous breathing, nonlabored ventilation, respiratory function stable and patient connected to nasal cannula oxygen Cardiovascular status: blood pressure returned to baseline and stable Postop Assessment: no signs of nausea or vomiting Anesthetic complications: no     Last Vitals:  Vitals:   05/16/16 1428 05/16/16 1442  BP: (!) 161/80 (!) 166/58  Pulse: 76 81  Resp: 14 16  Temp: 37.1 C 37.3 C    Last Pain:  Vitals:   05/17/16 0830  TempSrc:   PainSc: 1                  Martha Clan

## 2016-05-21 DIAGNOSIS — K224 Dyskinesia of esophagus: Secondary | ICD-10-CM | POA: Diagnosis not present

## 2016-05-28 DIAGNOSIS — G14 Postpolio syndrome: Secondary | ICD-10-CM | POA: Diagnosis not present

## 2016-05-28 DIAGNOSIS — K449 Diaphragmatic hernia without obstruction or gangrene: Secondary | ICD-10-CM | POA: Diagnosis not present

## 2016-05-28 DIAGNOSIS — K219 Gastro-esophageal reflux disease without esophagitis: Secondary | ICD-10-CM | POA: Diagnosis not present

## 2016-05-28 DIAGNOSIS — Z87891 Personal history of nicotine dependence: Secondary | ICD-10-CM | POA: Diagnosis not present

## 2016-05-30 ENCOUNTER — Telehealth: Payer: Self-pay

## 2016-05-30 NOTE — Telephone Encounter (Signed)
Left message for patient to call office.  Per Dr.Woodham- patient is not a candidate for surgery, however he would like for her to see her Gastroenterologist due to her esophagus issues.  We will need to send the referral once she lets [place] know who she has seen in the past.

## 2016-05-30 NOTE — Telephone Encounter (Signed)
Spoke with patient at this time. She is currently seeing Dr.Kounor Physiological scientist at [facility]) at this time.   Patient stated she will be follow with the above doctor.

## 2016-06-13 ENCOUNTER — Ambulatory Visit: Payer: Medicare Other | Admitting: Occupational Therapy

## 2016-06-18 ENCOUNTER — Ambulatory Visit: Payer: Medicare Other | Admitting: Occupational Therapy

## 2016-06-19 ENCOUNTER — Ambulatory Visit: Payer: Medicare Other | Attending: Orthopedic Surgery | Admitting: Occupational Therapy

## 2016-06-19 DIAGNOSIS — M25632 Stiffness of left wrist, not elsewhere classified: Secondary | ICD-10-CM

## 2016-06-19 DIAGNOSIS — M79642 Pain in left hand: Secondary | ICD-10-CM | POA: Diagnosis not present

## 2016-06-19 DIAGNOSIS — M25642 Stiffness of left hand, not elsewhere classified: Secondary | ICD-10-CM | POA: Diagnosis not present

## 2016-06-19 DIAGNOSIS — M6281 Muscle weakness (generalized): Secondary | ICD-10-CM | POA: Insufficient documentation

## 2016-06-19 NOTE — Therapy (Signed)
[facility] [address] [place], [address] Phone: [phone]   Fax:  [phone]  Occupational Therapy Evaluation  Patient Details  Name: Susan Shah MRN: [id number] Date of Birth: 12-31-1945 Referring Provider: Rudene Christians  Encounter Date: 06/19/2016      OT End of Session - 06/19/16 2132    Visit Number 1   Number of Visits 12   Date for OT Re-Evaluation 07/31/16   OT Start Time 1330   OT Stop Time 1416   OT Time Calculation (min) 46 min   Activity Tolerance Patient tolerated treatment well   Behavior During Therapy [facility] for tasks assessed/performed      Past Medical History:  Diagnosis Date  . Anxiety   . Asthma 2015   mild, seasonal allergy triggered.  . Cancer ([place]) 2013   skin cancer  on left hand  . Depression   . Esophageal dysmotility   . GERD (gastroesophageal reflux disease) 11/05/2012  . History of hiatal hernia   . Hyperlipidemia   . Hypertension   . Incontinence of urine   . Lung nodule   . OA (osteoarthritis)   . Obesity   . PONV (postoperative nausea and vomiting)   . Post-polio syndrome    contracted at 71 months old  . Pulmonary nodule 11/05/2012   RML  . Shingles 2009    Past Surgical History:  Procedure Laterality Date  . [place] STUDY N/A 04/24/2016   Procedure: 98 HOUR PH STUDY;  Surgeon: Lucilla Lame, MD;  Location: [facility] ENDOSCOPY;  Service: Endoscopy;  Laterality: N/A;  . ABDOMINAL HYSTERECTOMY     Total  . APPENDECTOMY    . CARPOMETACARPAL ([place]) FUSION OF THUMB Left 05/16/2016   Procedure: CARPOMETACARPAL [facility]) FUSION OF THUMB;  Surgeon: Hessie Knows, MD;  Location: [facility] ORS;  Service: Orthopedics;  Laterality: Left;  . CATARACT EXTRACTION Bilateral 12/2012  . CHOLECYSTECTOMY    . COLONOSCOPY  2003  . ESOPHAGEAL MANOMETRY N/A 04/24/2016   Procedure: ESOPHAGEAL MANOMETRY (EM);  Surgeon: Lucilla Lame, MD;  Location: [facility] ENDOSCOPY;  Service: Endoscopy;  Laterality:  N/A;  . ESOPHAGOGASTRODUODENOSCOPY  08/2011  . FOOT SURGERY    . HALLUX VALGUS CORRECTION    . HIP SURGERY Right 1950   tendon release r/t polio  . KNEE ARTHROSCOPY Left 06/23/2008  . RETINAL DETACHMENT SURGERY Right 03/2014  . ROTATOR CUFF REPAIR Bilateral   . URINARY SURGERY  2014   [place]    There were no vitals filed for this visit.      Subjective Assessment - 06/19/16 1340    Subjective  Surgery on 4/26 - stitches come out on 11th - scar doing okay - started some scar massage- had therapy a lot - some nerve pain   Patient Stated Goals Want to get the use of my L hand -  and pain better - need to get better thing , using crutches , open packages, tying my shoe on my R foot   Currently in Pain? No/denies           [facility] OT Assessment - 06/19/16 0001      Assessment   Diagnosis L thumb arthroplasty    Referring Provider menz   Onset Date 05/16/16     Precautions   Required Braces or Orthoses Other Brace/Splint  prefabe thumb spica      Home  Environment   Lives With Spouse     Prior Function  Vocation Retired   Paramedic dominant , yard work , shopping, tablet, crossword puzzle ,  cooking,      AROM   Right Wrist Extension 75 Degrees   Right Wrist Flexion 80 Degrees   Right Wrist Ulnar Deviation 40 Degrees   Left Wrist Extension 60 Degrees   Left Wrist Flexion 55 Degrees   Left Wrist Radial Deviation 10 Degrees   Left Wrist Ulnar Deviation 33 Degrees     Right Hand AROM   R Thumb MCP 0-60 45 Degrees   R Thumb IP 0-80 70 Degrees   R Thumb Radial ABduction/ADduction 0-55 45   R Thumb Palmar ABduction/ADduction 0-45 46     Left Hand AROM   L Thumb MCP 0-60 20 Degrees   L Thumb IP 0-80 50 Degrees   L Thumb Radial ADduction/ABduction 0-55 33   L Thumb Palmar ADduction/ABduction 0-45 35   L Thumb Opposition to Index --  opposition to DIP of 5th         Fluido therapy done - AROM for thumb and wrist -  L hand to increase ROM prior to  review of HEP   Heat   AROM for thumb in all planes , circle  Opposition - picking up 1 cm foam block - alternate digits Wrist AROM flexion , ext, RD and UD  10 reps   2 -3 x day  Splint when up and about                OT Education - 06/19/16 2132    Education provided Yes   Education Details findings of eval and HEP    Person(s) Educated Patient   Methods Explanation;Demonstration;Tactile cues;Verbal cues;Handout   Comprehension Verbal cues required;Returned demonstration;Verbalized understanding          OT Short Term Goals - 06/19/16 2143      OT SHORT TERM GOAL #1   Title Pain on PRWHE improve with more than 12 points o   Baseline Pain on PRWHE at eval 22/50    Time 4   Period Weeks   Status New     OT SHORT TERM GOAL #2   Title AROM for L thumb MP and IP flexion , PA and RA improve with more than 5 degrees to do buttons and shoelaces with more ease    Baseline see flowsheet    Time 4   Period Weeks   Status New     OT SHORT TERM GOAL #3   Title L wrist AROM improve to WNL to push up from chair, turn doorknob , use hand in bathing more than 50%    Baseline see flowsheet    Time 4   Period Weeks   Status New     OT SHORT TERM GOAL #4   Title Pt to be ind in HEP to increase ROM , decrease pain , and be able to wean out of  prefab thumb spica    Baseline thumb spica in most all the time - except when sitting    Time 2   Period Weeks   Status New           OT Long Term Goals - 06/19/16 2145      OT LONG TERM GOAL #1   Title L thumb strength increase for pt to use thumb in ADL's with no increase in symptoms    Baseline in prefab thumb spica at the moment - not using thumb  Time 6   Period Weeks   Status New     OT LONG TERM GOAL #2   Title Grip in L hand increase to more than 50% compare to R to carry more than 5 lbs , squeeze washcloth    Baseline NT - pt 5 wks s/p   Time 6   Period Weeks   Status New     OT LONG TERM GOAL #3    Title L prehension strength increase to more than 50% compare to R hand to carry plate, cut with knife and fork, ty shoes   Baseline NT - pt 5 wks s/p    Time 6   Period Weeks   Status New               Plan - 06/19/16 2133    Clinical Impression Statement Pt present 5 wks s/p L thumb arthroplasty , scar healing very well - pt show decrease AROM and PROM in all planes for thumb , hyper extention at MP with thumb RA and PA , decrease strength in L hand and increase pain - limitiing her functional use of L hand in ADL' and IADLs - pt do use bilateral crutches - at moment using prefab thumb spica - would need to be really carefull weaning pt out of that in next 7 wks    Occupational performance deficits (Please refer to evaluation for details): ADL's;IADL's;Play;Leisure;Other  using crutches   Rehab Potential Good   OT Frequency 2x / week   OT Duration 6 weeks   OT Treatment/Interventions Self-care/ADL training;Fluidtherapy;Splinting;Patient/family education;Therapeutic exercises;Ultrasound;Passive range of motion;Manual Therapy;Parrafin   Plan assess progress with HEP    Clinical Decision Making Multiple treatment options, significant modification of task necessary   OT Home Exercise Plan see pt instruction   Consulted and Agree with Plan of Care Patient      Patient will benefit from skilled therapeutic intervention in order to improve the following deficits and impairments:  Impaired flexibility, Decreased range of motion, Decreased coordination, Increased edema, Pain, Impaired UE functional use, Decreased strength, Decreased scar mobility, Decreased knowledge of precautions  Visit Diagnosis: Stiffness of left hand, not elsewhere classified - Plan: Ot plan of care cert/re-cert  Stiffness of left wrist, not elsewhere classified - Plan: Ot plan of care cert/re-cert  Pain in left hand - Plan: Ot plan of care cert/re-cert  Muscle weakness (generalized) - Plan: Ot plan of care  cert/re-cert      G-Codes - 19/14/78 2140    Functional Assessment Tool Used (Outpatient only) PRWHE , ROM , grip and prehension strength - clinical judgement   Functional Limitation Self care   Self Care Current Status (G9562) At least 40 percent but less than 60 percent impaired, limited or restricted   Self Care Goal Status (Z3086) At least 1 percent but less than 20 percent impaired, limited or restricted      Problem List Patient Active Problem List   Diagnosis Date Noted  . Hiatal hernia 04/11/2016  . Anxiety   . OA (osteoarthritis)   . Hyperlipidemia   . Hypertension   . Post-polio syndrome   . Lung nodule   . Esophageal dysmotility   . Incontinence of urine   . Obesity   . GERD (gastroesophageal reflux disease) 11/05/2012  . Pulmonary nodule 11/05/2012  . Shingles 01/22/2007    Rosalyn Gess OTR/L,CLT 06/19/2016, 9:53 PM  [facility] [address], [place], [address]  Phone: [phone]   Fax:  [phone]  Name: Susan Shah MRN: [id number] Date of Birth: 24-Aug-1945

## 2016-06-19 NOTE — Patient Instructions (Signed)
HEat   AROM for thumb in all planes , circle  Opposition - picking up 1 cm foam block - alternate digits Wrist AROM flexion , ext, RD and UD  10 reps   2 -3 x day  Splint when up and about

## 2016-06-26 ENCOUNTER — Ambulatory Visit: Payer: Medicare Other | Attending: Orthopedic Surgery | Admitting: Occupational Therapy

## 2016-06-26 DIAGNOSIS — M6281 Muscle weakness (generalized): Secondary | ICD-10-CM

## 2016-06-26 DIAGNOSIS — M79642 Pain in left hand: Secondary | ICD-10-CM | POA: Insufficient documentation

## 2016-06-26 DIAGNOSIS — M25642 Stiffness of left hand, not elsewhere classified: Secondary | ICD-10-CM | POA: Diagnosis not present

## 2016-06-26 DIAGNOSIS — M25632 Stiffness of left wrist, not elsewhere classified: Secondary | ICD-10-CM | POA: Insufficient documentation

## 2016-06-26 NOTE — Patient Instructions (Signed)
Same HEP add  Pt to do at home massage webspace prior to thumb CMC PA and RA  PROM for thumb IP and MP flexion   gentle pull only    cont with Wrist AROM flexion , ext, RD and UD  Add isometric strengthening for wrist neutral  Wrist extention , flexion ,RD , UD  10 reps   2 -3 x day  Splint when up and about

## 2016-06-26 NOTE — Therapy (Signed)
[place] [facility] [address], [place], [address] Phone: [phone]   Fax:  [phone]  Occupational Therapy Treatment  Patient Details  Name: Susan Shah MRN: [id number] Date of Birth: Jun 15, 1945 Referring Provider: Rudene Christians  Encounter Date: 06/26/2016      OT End of Session - 06/26/16 1459    Visit Number 2   Number of Visits 12   Date for OT Re-Evaluation 07/31/16   OT Start Time 1335   OT Stop Time 1425   OT Time Calculation (min) 50 min   Activity Tolerance Patient tolerated treatment well   Behavior During Therapy [facility] for tasks assessed/performed      Past Medical History:  Diagnosis Date  . Anxiety   . Asthma 2015   mild, seasonal allergy triggered.  . Cancer ([place]) 2013   skin cancer  on left hand  . Depression   . Esophageal dysmotility   . GERD (gastroesophageal reflux disease) 11/05/2012  . History of hiatal hernia   . Hyperlipidemia   . Hypertension   . Incontinence of urine   . Lung nodule   . OA (osteoarthritis)   . Obesity   . PONV (postoperative nausea and vomiting)   . Post-polio syndrome    contracted at 80 months old  . Pulmonary nodule 11/05/2012   RML  . Shingles 2009    Past Surgical History:  Procedure Laterality Date  . [place] STUDY N/A 04/24/2016   Procedure: 46 HOUR PH STUDY;  Surgeon: Lucilla Lame, MD;  Location: [facility] ENDOSCOPY;  Service: Endoscopy;  Laterality: N/A;  . ABDOMINAL HYSTERECTOMY     Total  . APPENDECTOMY    . CARPOMETACARPAL ([place]) FUSION OF THUMB Left 05/16/2016   Procedure: CARPOMETACARPAL [facility]) FUSION OF THUMB;  Surgeon: Hessie Knows, MD;  Location: [facility] ORS;  Service: Orthopedics;  Laterality: Left;  . CATARACT EXTRACTION Bilateral 12/2012  . CHOLECYSTECTOMY    . COLONOSCOPY  2003  . ESOPHAGEAL MANOMETRY N/A 04/24/2016   Procedure: ESOPHAGEAL MANOMETRY (EM);  Surgeon: Lucilla Lame, MD;  Location: [facility] ENDOSCOPY;  Service: Endoscopy;  Laterality:  N/A;  . ESOPHAGOGASTRODUODENOSCOPY  08/2011  . FOOT SURGERY    . HALLUX VALGUS CORRECTION    . HIP SURGERY Right 1950   tendon release r/t polio  . KNEE ARTHROSCOPY Left 06/23/2008  . RETINAL DETACHMENT SURGERY Right 03/2014  . ROTATOR CUFF REPAIR Bilateral   . URINARY SURGERY  2014   [place]    There were no vitals filed for this visit.      Subjective Assessment - 06/26/16 1457    Subjective  Doing okay - I am this Thursday 6 wks s/p - pain only little with exercises - but then goes away - still weargin splint when up and about - use crutch - need to be carefull   Patient Stated Goals Want to get the use of my L hand -  and pain better - need to get better thing , using crutches , open packages, tying my shoe on my R foot   Currently in Pain? No/denies            [facility] OT Assessment - 06/26/16 0001      AROM   Left Wrist Extension 70 Degrees   Left Wrist Flexion 55 Degrees  70 after heat     Left Hand AROM   L Thumb MCP 0-60 25 Degrees   L Thumb IP 0-80 60 Degrees  L Thumb Radial ADduction/ABduction 0-55 35   L Thumb Palmar ADduction/ABduction 0-45 38                  OT Treatments/Exercises (OP) - 06/26/16 0001      LUE Fluidotherapy   Number Minutes Fluidotherapy 10 Minutes   LUE Fluidotherapy Location Hand;Wrist   Comments AT SOC to increase ROM and decrease pain - AROM for wrist nad thumb in all planes       Assess progress in thumb and wrist AROM   Fluido therapy done - AROM for thumb and wrist in all planes   -  L hand wrist flexion increase greatly   Soft tissue mobs done to thumb webspace - and gentle PA and RA of thumb CMC  MC spread and CT spread  Pt to do at home massage webspace prior to thumb CMC PA and RA  PROM for thumb IP and MP flexion  Add to HEP - gentle pull only    AROM for thumb blocked IP and MP flexion  10 reps  Opposition - picking up 1 cm foam block - alternate digits Wrist AROM flexion , ext, RD and UD  Add  isometric strengthening for wrist neutral  Wrist extention , flexion ,RD , UD  10 reps   2 -3 x day  Splint when up and about             OT Education - 06/26/16 1459    Education provided Yes   Education Details HEP update   Person(s) Educated Patient   Methods Explanation;Demonstration;Tactile cues;Verbal cues   Comprehension Verbal cues required          OT Short Term Goals - 06/19/16 2143      OT SHORT TERM GOAL #1   Title Pain on PRWHE improve with more than 12 points o   Baseline Pain on PRWHE at eval 22/50    Time 4   Period Weeks   Status New     OT SHORT TERM GOAL #2   Title AROM for L thumb MP and IP flexion , PA and RA improve with more than 5 degrees to do buttons and shoelaces with more ease    Baseline see flowsheet    Time 4   Period Weeks   Status New     OT SHORT TERM GOAL #3   Title L wrist AROM improve to WNL to push up from chair, turn doorknob , use hand in bathing more than 50%    Baseline see flowsheet    Time 4   Period Weeks   Status New     OT SHORT TERM GOAL #4   Title Pt to be ind in HEP to increase ROM , decrease pain , and be able to wean out of  prefab thumb spica    Baseline thumb spica in most all the time - except when sitting    Time 2   Period Weeks   Status New           OT Long Term Goals - 06/19/16 2145      OT LONG TERM GOAL #1   Title L thumb strength increase for pt to use thumb in ADL's with no increase in symptoms    Baseline in prefab thumb spica at the moment - not using thumb    Time 6   Period Weeks   Status New     OT LONG TERM GOAL #2   Title  Grip in L hand increase to more than 50% compare to R to carry more than 5 lbs , squeeze washcloth    Baseline NT - pt 5 wks s/p   Time 6   Period Weeks   Status New     OT LONG TERM GOAL #3   Title L prehension strength increase to more than 50% compare to R hand to carry plate, cut with knife and fork, ty shoes   Baseline NT - pt 5 wks s/p    Time 6    Period Weeks   Status New               Plan - 06/26/16 1459    Clinical Impression Statement Pt is about 6 wks s/p from L thumb arthrplasty -AROM in thumb and wrist improving - intiated isometric strengthening to wrist this date - thumb PROM initiated - pt to return next week for inititiating strengthening to thumb - pt to cont to keep pain under control - work on ROM and strength -thumb prefabe thumb spica on with crutch use    Occupational performance deficits (Please refer to evaluation for details): ADL's;IADL's;Play;Leisure;Other   OT Frequency 2x / week   OT Duration 6 weeks   OT Treatment/Interventions Self-care/ADL training;Fluidtherapy;Splinting;Patient/family education;Therapeutic exercises;Ultrasound;Passive range of motion;Manual Therapy;Parrafin   Plan intiate isometric strengthening to thumb - assess progress    OT Home Exercise Plan see pt instruction   Consulted and Agree with Plan of Care Patient      Patient will benefit from skilled therapeutic intervention in order to improve the following deficits and impairments:  Impaired flexibility, Decreased range of motion, Decreased coordination, Increased edema, Pain, Impaired UE functional use, Decreased strength, Decreased scar mobility, Decreased knowledge of precautions  Visit Diagnosis: Stiffness of left hand, not elsewhere classified  Stiffness of left wrist, not elsewhere classified  Pain in left hand  Muscle weakness (generalized)    Problem List Patient Active Problem List   Diagnosis Date Noted  . Hiatal hernia 04/11/2016  . Anxiety   . OA (osteoarthritis)   . Hyperlipidemia   . Hypertension   . Post-polio syndrome   . Lung nodule   . Esophageal dysmotility   . Incontinence of urine   . Obesity   . GERD (gastroesophageal reflux disease) 11/05/2012  . Pulmonary nodule 11/05/2012  . Shingles 01/22/2007    Rosalyn Gess OTR/L,CLT 06/26/2016, 3:05 PM  [place] [facility] [address], [place], [address] Phone: [phone]   Fax:  [phone]  Name: Susan Shah MRN: [id number] Date of Birth: August 09, 1945

## 2016-06-27 ENCOUNTER — Ambulatory Visit: Payer: Medicare Other | Admitting: Occupational Therapy

## 2016-07-04 ENCOUNTER — Ambulatory Visit: Payer: Medicare Other | Admitting: Occupational Therapy

## 2016-07-04 DIAGNOSIS — M25632 Stiffness of left wrist, not elsewhere classified: Secondary | ICD-10-CM

## 2016-07-04 DIAGNOSIS — M25642 Stiffness of left hand, not elsewhere classified: Secondary | ICD-10-CM

## 2016-07-04 DIAGNOSIS — M6281 Muscle weakness (generalized): Secondary | ICD-10-CM

## 2016-07-04 DIAGNOSIS — M79642 Pain in left hand: Secondary | ICD-10-CM | POA: Diagnosis not present

## 2016-07-04 NOTE — Patient Instructions (Signed)
Cont still with HEP  Heat   PROM for thumb MP flexion   Add rubber band for thumb PA and RA - 10 reps no pain  Light blue easy putty for grip , lat and 3 point grip  10 reps  No pain   Hammer 16oz hold close to head or 1 lbs for wrist in all planes   no  Pain   10 reps  Add to HEP for 10 reps 2-3 x day  In 3 days can do 2 sets of all - 2 x day if not increase pain  And in 5-6 days if no pain 3 sets  Wean in [facility] neoprene splint using hand in  House - no with crutch - then thumb spica

## 2016-07-04 NOTE — Therapy (Signed)
[place] [facility] [address] [place], [address] Phone: [phone]   Fax:  [phone]  Occupational Therapy Treatment  Patient Details  Name: Susan Shah MRN: [id number] Date of Birth: 1945/09/15 Referring Provider: Rudene Christians  Encounter Date: 07/04/2016      OT End of Session - 07/04/16 1821    Visit Number 3   Number of Visits 12   Date for OT Re-Evaluation 07/31/16   OT Start Time 1779   OT Stop Time 1558   OT Time Calculation (min) 42 min   Activity Tolerance Patient tolerated treatment well   Behavior During Therapy [facility] for tasks assessed/performed      Past Medical History:  Diagnosis Date  . Anxiety   . Asthma 2015   mild, seasonal allergy triggered.  . Cancer ([place]) 2013   skin cancer  on left hand  . Depression   . Esophageal dysmotility   . GERD (gastroesophageal reflux disease) 11/05/2012  . History of hiatal hernia   . Hyperlipidemia   . Hypertension   . Incontinence of urine   . Lung nodule   . OA (osteoarthritis)   . Obesity   . PONV (postoperative nausea and vomiting)   . Post-polio syndrome    contracted at 63 months old  . Pulmonary nodule 11/05/2012   RML  . Shingles 2009    Past Surgical History:  Procedure Laterality Date  . [place] STUDY N/A 04/24/2016   Procedure: 38 HOUR PH STUDY;  Surgeon: Lucilla Lame, MD;  Location: [facility] ENDOSCOPY;  Service: Endoscopy;  Laterality: N/A;  . ABDOMINAL HYSTERECTOMY     Total  . APPENDECTOMY    . CARPOMETACARPAL ([place]) FUSION OF THUMB Left 05/16/2016   Procedure: CARPOMETACARPAL [facility]) FUSION OF THUMB;  Surgeon: Hessie Knows, MD;  Location: [facility] ORS;  Service: Orthopedics;  Laterality: Left;  . CATARACT EXTRACTION Bilateral 12/2012  . CHOLECYSTECTOMY    . COLONOSCOPY  2003  . ESOPHAGEAL MANOMETRY N/A 04/24/2016   Procedure: ESOPHAGEAL MANOMETRY (EM);  Surgeon: Lucilla Lame, MD;  Location: [facility] ENDOSCOPY;  Service: Endoscopy;  Laterality:  N/A;  . ESOPHAGOGASTRODUODENOSCOPY  08/2011  . FOOT SURGERY    . HALLUX VALGUS CORRECTION    . HIP SURGERY Right 1950   tendon release r/t polio  . KNEE ARTHROSCOPY Left 06/23/2008  . RETINAL DETACHMENT SURGERY Right 03/2014  . ROTATOR CUFF REPAIR Bilateral   . URINARY SURGERY  2014   [place]    There were no vitals filed for this visit.      Subjective Assessment - 07/04/16 1539    Subjective  Seen Dr Rudene Christians - release and to cont with therapy with you - doing okay - still not trusting my hand  - using most of the time when up my hard splint - do use crutches in the house - more weakness than pain    Patient Stated Goals Want to get the use of my L hand -  and pain better - need to get better thing , using crutches , open packages, tying my shoe on my R foot   Currently in Pain? No/denies            [facility] OT Assessment - 07/04/16 0001      AROM   Left Wrist Extension 75 Degrees   Left Wrist Flexion 68 Degrees   Left Wrist Radial Deviation 15 Degrees   Left Wrist Ulnar Deviation 33 Degrees  Strength   Right Hand Grip (lbs) 38   Right Hand Lateral Pinch 12 lbs   Right Hand 3 Point Pinch 13 lbs   Left Hand Grip (lbs) 25   Left Hand Lateral Pinch 5 lbs   Left Hand 3 Point Pinch 6 lbs     Left Hand AROM   L Thumb MCP 0-60 32 Degrees                  OT Treatments/Exercises (OP) - 07/04/16 0001      LUE Paraffin   Number Minutes Paraffin 10 Minutes   LUE Paraffin Location Hand   Comments prior to manual and ther ext to increase MP flexion of thumb        Assess progress in thumb and wrist AROM  As  Well as grip and prehension strength   paraffin done for decrease stiffness , increase MP flexion of thumb   Soft tissue mobs done to thumb webspace - and gentle PA and RA of thumb CMC  MC spread and CT spread  Pt to do at home massage webspace prior to thumb CMC PA and RA  PROM for thumb MP flexion    Opposition - picking up 1 cm foam block -  alternate digits Add rubber band for thumb PA and RA - 10 reps no pain  Light blue easy putty for grip , lat and 3 point grip  10 reps  No pain   Hammer 16oz hold close to head or 1 lbs for wrist in all planes   no  Pain   10 reps  Add to HEP for 10 reps 2-3 x day  In 3 days can do 2 sets of all - 2 x day if not increase pain  And in 5-6 days if no pain 3 sets  Wean in [facility] neoprene splint using hand in  House - no with crutch - then thumb spica            OT Education - 07/04/16 1821    Education provided Yes   Education Details HEP update - more strengthening    Person(s) Educated Patient   Methods Explanation;Demonstration;Tactile cues;Verbal cues;Handout   Comprehension Verbal cues required;Returned demonstration;Verbalized understanding          OT Short Term Goals - 06/19/16 2143      OT SHORT TERM GOAL #1   Title Pain on PRWHE improve with more than 12 points o   Baseline Pain on PRWHE at eval 22/50    Time 4   Period Weeks   Status New     OT SHORT TERM GOAL #2   Title AROM for L thumb MP and IP flexion , PA and RA improve with more than 5 degrees to do buttons and shoelaces with more ease    Baseline see flowsheet    Time 4   Period Weeks   Status New     OT SHORT TERM GOAL #3   Title L wrist AROM improve to WNL to push up from chair, turn doorknob , use hand in bathing more than 50%    Baseline see flowsheet    Time 4   Period Weeks   Status New     OT SHORT TERM GOAL #4   Title Pt to be ind in HEP to increase ROM , decrease pain , and be able to wean out of  prefab thumb spica    Baseline thumb spica in most all the time -  except when sitting    Time 2   Period Weeks   Status New           OT Long Term Goals - 06/19/16 2145      OT LONG TERM GOAL #1   Title L thumb strength increase for pt to use thumb in ADL's with no increase in symptoms    Baseline in prefab thumb spica at the moment - not using thumb    Time 6   Period Weeks    Status New     OT LONG TERM GOAL #2   Title Grip in L hand increase to more than 50% compare to R to carry more than 5 lbs , squeeze washcloth    Baseline NT - pt 5 wks s/p   Time 6   Period Weeks   Status New     OT LONG TERM GOAL #3   Title L prehension strength increase to more than 50% compare to R hand to carry plate, cut with knife and fork, ty shoes   Baseline NT - pt 5 wks s/p    Time 6   Period Weeks   Status New               Plan - 07/04/16 1822    Clinical Impression Statement Pt cont to make progress in ROM at wrist and thumb - MP flexion still impaired - no pain at rest - add strengthening in all planes for thumb and wrist - able to do putty , PA and RA , and 1 lbs for wrist - no pain - pt to work on increase sets over next week - and return for reassement and upgrade of HEP    Occupational performance deficits (Please refer to evaluation for details): ADL's;IADL's;Play;Leisure   Rehab Potential Good   OT Frequency 1x / week   OT Duration 4 weeks   OT Treatment/Interventions Self-care/ADL training;Fluidtherapy;Splinting;Patient/family education;Therapeutic exercises;Ultrasound;Passive range of motion;Manual Therapy;Parrafin   Plan assess how did with strengthenign at home - pain    OT Home Exercise Plan see pt instruction   Consulted and Agree with Plan of Care Patient      Patient will benefit from skilled therapeutic intervention in order to improve the following deficits and impairments:  Impaired flexibility, Decreased range of motion, Decreased coordination, Increased edema, Pain, Impaired UE functional use, Decreased strength, Decreased scar mobility, Decreased knowledge of precautions  Visit Diagnosis: Stiffness of left hand, not elsewhere classified  Stiffness of left wrist, not elsewhere classified  Pain in left hand  Muscle weakness (generalized)    Problem List Patient Active Problem List   Diagnosis Date Noted  . Hiatal hernia 04/11/2016   . Anxiety   . OA (osteoarthritis)   . Hyperlipidemia   . Hypertension   . Post-polio syndrome   . Lung nodule   . Esophageal dysmotility   . Incontinence of urine   . Obesity   . GERD (gastroesophageal reflux disease) 11/05/2012  . Pulmonary nodule 11/05/2012  . Shingles 01/22/2007    Rosalyn Gess OTR/L,CLT 07/04/2016, 6:24 PM  [place] [facility] [address], [place], [address] Phone: [phone]   Fax:  [phone]  Name: IVYANNA SIBERT MRN: [id number] Date of Birth: 1945/07/29

## 2016-07-08 DIAGNOSIS — K449 Diaphragmatic hernia without obstruction or gangrene: Secondary | ICD-10-CM | POA: Diagnosis not present

## 2016-07-08 DIAGNOSIS — K219 Gastro-esophageal reflux disease without esophagitis: Secondary | ICD-10-CM | POA: Diagnosis not present

## 2016-07-12 ENCOUNTER — Ambulatory Visit: Payer: Medicare Other | Admitting: Occupational Therapy

## 2016-07-12 DIAGNOSIS — M79642 Pain in left hand: Secondary | ICD-10-CM | POA: Diagnosis not present

## 2016-07-12 DIAGNOSIS — M25632 Stiffness of left wrist, not elsewhere classified: Secondary | ICD-10-CM | POA: Diagnosis not present

## 2016-07-12 DIAGNOSIS — M25642 Stiffness of left hand, not elsewhere classified: Secondary | ICD-10-CM | POA: Diagnosis not present

## 2016-07-12 DIAGNOSIS — M6281 Muscle weakness (generalized): Secondary | ICD-10-CM | POA: Diagnosis not present

## 2016-07-12 NOTE — Patient Instructions (Signed)
  rubber band for thumb PA and RA - 10 reps no pain  Upgrade to teal putty  for grip , lat and 3 point grip  10 reps   Hammer 16oz hold close to head or 1 lbs for wrist in all planes   10 reps   Pt to do 10 reps 2 x day - for hammer or 1 lbs can do 2 sets and increase to 3 sets in 3 days  But putty and thumb do 1 set 2 x day  And in  3 days can do 2 sets - 2 x day if not increase pain    Wean in [facility] neoprene splint using hand in  House - not with crutch

## 2016-07-12 NOTE — Therapy (Signed)
[place] [address], [place], [address] Phone: [phone]   Fax:  [phone]  Occupational Therapy Treatment  Patient Details  Name: Susan Shah MRN: [id number] Date of Birth: 1945/10/26 Referring Provider: Rudene Christians  Encounter Date: 07/12/2016      OT End of Session - 07/12/16 1255    Visit Number 4   Number of Visits 12   Date for OT Re-Evaluation 07/31/16   OT Start Time 1155   OT Stop Time 1238   OT Time Calculation (min) 43 min   Activity Tolerance Patient tolerated treatment well   Behavior During Therapy [facility] for tasks assessed/performed      Past Medical History:  Diagnosis Date  . Anxiety   . Asthma 2015   mild, seasonal allergy triggered.  . Cancer ([place]) 2013   skin cancer  on left hand  . Depression   . Esophageal dysmotility   . GERD (gastroesophageal reflux disease) 11/05/2012  . History of hiatal hernia   . Hyperlipidemia   . Hypertension   . Incontinence of urine   . Lung nodule   . OA (osteoarthritis)   . Obesity   . PONV (postoperative nausea and vomiting)   . Post-polio syndrome    contracted at 54 months old  . Pulmonary nodule 11/05/2012   RML  . Shingles 2009    Past Surgical History:  Procedure Laterality Date  . [place] STUDY N/A 04/24/2016   Procedure: 31 HOUR PH STUDY;  Surgeon: Lucilla Lame, MD;  Location: [facility] ENDOSCOPY;  Service: Endoscopy;  Laterality: N/A;  . ABDOMINAL HYSTERECTOMY     Total  . APPENDECTOMY    . CARPOMETACARPAL ([place]) FUSION OF THUMB Left 05/16/2016   Procedure: CARPOMETACARPAL [facility]) FUSION OF THUMB;  Surgeon: Hessie Knows, MD;  Location: [facility] ORS;  Service: Orthopedics;  Laterality: Left;  . CATARACT EXTRACTION Bilateral 12/2012  . CHOLECYSTECTOMY    . COLONOSCOPY  2003  . ESOPHAGEAL MANOMETRY N/A 04/24/2016   Procedure: ESOPHAGEAL MANOMETRY (EM);  Surgeon: Lucilla Lame, MD;  Location: [facility] ENDOSCOPY;  Service: Endoscopy;  Laterality:  N/A;  . ESOPHAGOGASTRODUODENOSCOPY  08/2011  . FOOT SURGERY    . HALLUX VALGUS CORRECTION    . HIP SURGERY Right 1950   tendon release r/t polio  . KNEE ARTHROSCOPY Left 06/23/2008  . RETINAL DETACHMENT SURGERY Right 03/2014  . ROTATOR CUFF REPAIR Bilateral   . URINARY SURGERY  2014   [place]    There were no vitals filed for this visit.      Subjective Assessment - 07/12/16 1248    Subjective  Doing okay - can cut my food now, use it some in kitchen - nothing heavy - do laundry - tried my crutch the other day with soft splint but it hurt - so still hard one with crutch - did not do the 1 lbs weigth HEP  - forgot    Patient Stated Goals Want to get the use of my L hand -  and pain better - need to get better thing , using crutches , open packages, tying my shoe on my R foot   Currently in Pain? No/denies            [facility] OT Assessment - 07/12/16 0001      Strength   Left Hand Grip (lbs) 30   Left Hand Lateral Pinch 6 lbs   Left Hand 3 Point Pinch 6 lbs  Measured  grip and prehension strength - see flowsheet Assess scar and ROM in wrist and thumb   Opposition - to base of 5th this date  rubber band for thumb PA and RA - 10 reps no pain - pt did not do RA  Upgrade to teal putty  for grip , lat and 3 point grip  10 reps  No pain   Hammer 16oz hold close to head or 1 lbs for wrist in all planes   no  Pain   10 reps  Review HEP again - because pt did not do RA for rubberband or 1 lbs weigth/hammer   Pt to do 10 reps 2 x day - for hammer or 1 lbs can do 2 sets and increase to 3 sets in 3 days  But putty and thumb do 1 set 2 x day  And in  3 days can do 2 sets - 2 x day if not increase pain    Wean in [facility] neoprene splint using hand in  House - not with crutch                       OT Education - 07/12/16 1254    Education provided Yes   Education Details HEP upgrade and review   Person(s) Educated Patient   Methods  Explanation;Demonstration;Tactile cues;Verbal cues;Handout   Comprehension Verbal cues required;Returned demonstration          OT Short Term Goals - 07/12/16 1257      OT SHORT TERM GOAL #1   Title Pain on PRWHE improve with more than 12 points o   Baseline Pain on PRWHE at eval 22/50 - no pain at rest    Time 2   Period Weeks   Status On-going     OT SHORT TERM GOAL #2   Title AROM for L thumb MP and IP flexion , PA and RA improve with more than 5 degrees to do buttons and shoelaces with more ease    Baseline progress in ROM - assess next session use    Time 2   Period Weeks   Status On-going     OT SHORT TERM GOAL #3   Title L wrist AROM improve to WNL to push up from chair, turn doorknob , use hand in bathing more than 50%    Baseline will assess push up from chair - others she did meet    Time 2   Period Weeks   Status On-going     OT SHORT TERM GOAL #4   Title Pt to be ind in HEP to increase ROM , decrease pain , and be able to wean out of  prefab thumb spica    Status Achieved           OT Long Term Goals - 07/12/16 1258      OT LONG TERM GOAL #1   Title L thumb strength increase for pt to use thumb in ADL's with no increase in symptoms    Baseline progressing    Time 3   Period Weeks   Status On-going     OT LONG TERM GOAL #2   Title Grip in L hand increase to more than 50% compare to R to carry more than 5 lbs , squeeze washcloth    Baseline progressing - 30 L , R 38   Time 3   Period Weeks   Status On-going     OT LONG TERM  GOAL #3   Title L prehension strength increase to more than 50% compare to R hand to carry plate, cut with knife and fork, ty shoes   Baseline see flowsheet   Time 3   Period Weeks   Status On-going               Plan - 07/12/16 1255    Clinical Impression Statement Pt showed good progress in ROM thumb and wrist - increase grip - but did not do strengthning for wrist - review again and pt to do at home and increase  sets if not pain - cont to increase strength - cont thumb spica with crutch use    Occupational performance deficits (Please refer to evaluation for details): ADL's;IADL's;Play;Leisure   Rehab Potential Good   OT Frequency 1x / week   OT Duration 4 weeks   OT Treatment/Interventions Self-care/ADL training;Fluidtherapy;Splinting;Patient/family education;Therapeutic exercises;Ultrasound;Passive range of motion;Manual Therapy;Parrafin   Plan assess progress    Clinical Decision Making Several treatment options, min-mod task modification necessary   OT Home Exercise Plan see pt instruction   Consulted and Agree with Plan of Care Patient      Patient will benefit from skilled therapeutic intervention in order to improve the following deficits and impairments:  Impaired flexibility, Decreased range of motion, Decreased coordination, Increased edema, Pain, Impaired UE functional use, Decreased strength, Decreased scar mobility, Decreased knowledge of precautions  Visit Diagnosis: Stiffness of left hand, not elsewhere classified  Stiffness of left wrist, not elsewhere classified  Pain in left hand  Muscle weakness (generalized)    Problem List Patient Active Problem List   Diagnosis Date Noted  . Hiatal hernia 04/11/2016  . Anxiety   . OA (osteoarthritis)   . Hyperlipidemia   . Hypertension   . Post-polio syndrome   . Lung nodule   . Esophageal dysmotility   . Incontinence of urine   . Obesity   . GERD (gastroesophageal reflux disease) 11/05/2012  . Pulmonary nodule 11/05/2012  . Shingles 01/22/2007    Rosalyn Gess OTR/L,CLT 07/12/2016, 12:59 PM  [place] [facility] [address], [place], [address] Phone: [phone]   Fax:  [phone]  Name: Susan Shah MRN: [id number] Date of Birth: [birth year]

## 2016-07-19 ENCOUNTER — Ambulatory Visit: Payer: Medicare Other | Admitting: Occupational Therapy

## 2016-07-19 DIAGNOSIS — M25632 Stiffness of left wrist, not elsewhere classified: Secondary | ICD-10-CM | POA: Diagnosis not present

## 2016-07-19 DIAGNOSIS — M6281 Muscle weakness (generalized): Secondary | ICD-10-CM | POA: Diagnosis not present

## 2016-07-19 DIAGNOSIS — M25642 Stiffness of left hand, not elsewhere classified: Secondary | ICD-10-CM | POA: Diagnosis not present

## 2016-07-19 DIAGNOSIS — M79642 Pain in left hand: Secondary | ICD-10-CM

## 2016-07-19 NOTE — Therapy (Signed)
[place] [facility] [address], [place], [address] Phone: [phone]   Fax:  [phone]  Occupational Therapy Treatment  Patient Details  Name: Susan Shah MRN: [id number] Date of Birth: December 08, 1945 Referring Provider: Rudene Christians  Encounter Date: 07/19/2016      OT End of Session - 07/19/16 1544    Visit Number 5   Number of Visits 12   Date for OT Re-Evaluation 07/31/16   OT Start Time 0942   OT Stop Time 1030   OT Time Calculation (min) 48 min   Activity Tolerance Patient tolerated treatment well   Behavior During Therapy [facility] for tasks assessed/performed      Past Medical History:  Diagnosis Date  . Anxiety   . Asthma 2015   mild, seasonal allergy triggered.  . Cancer ([place]) 2013   skin cancer  on left hand  . Depression   . Esophageal dysmotility   . GERD (gastroesophageal reflux disease) 11/05/2012  . History of hiatal hernia   . Hyperlipidemia   . Hypertension   . Incontinence of urine   . Lung nodule   . OA (osteoarthritis)   . Obesity   . PONV (postoperative nausea and vomiting)   . Post-polio syndrome    contracted at 36 months old  . Pulmonary nodule 11/05/2012   RML  . Shingles 2009    Past Surgical History:  Procedure Laterality Date  . [place] STUDY N/A 04/24/2016   Procedure: 72 HOUR PH STUDY;  Surgeon: Lucilla Lame, MD;  Location: [facility] ENDOSCOPY;  Service: Endoscopy;  Laterality: N/A;  . ABDOMINAL HYSTERECTOMY     Total  . APPENDECTOMY    . CARPOMETACARPAL ([place]) FUSION OF THUMB Left 05/16/2016   Procedure: CARPOMETACARPAL [facility]) FUSION OF THUMB;  Surgeon: Hessie Knows, MD;  Location: [facility] ORS;  Service: Orthopedics;  Laterality: Left;  . CATARACT EXTRACTION Bilateral 12/2012  . CHOLECYSTECTOMY    . COLONOSCOPY  2003  . ESOPHAGEAL MANOMETRY N/A 04/24/2016   Procedure: ESOPHAGEAL MANOMETRY (EM);  Surgeon: Lucilla Lame, MD;  Location: [facility] ENDOSCOPY;  Service: Endoscopy;  Laterality:  N/A;  . ESOPHAGOGASTRODUODENOSCOPY  08/2011  . FOOT SURGERY    . HALLUX VALGUS CORRECTION    . HIP SURGERY Right 1950   tendon release r/t polio  . KNEE ARTHROSCOPY Left 06/23/2008  . RETINAL DETACHMENT SURGERY Right 03/2014  . ROTATOR CUFF REPAIR Bilateral   . URINARY SURGERY  2014   [place]    There were no vitals filed for this visit.      Subjective Assessment - 07/19/16 1538    Subjective  I can use my crutches with the soft splint - and am using my hand more - not really any pain or issues using it - little spot in my palm at base of 4th digit - do you know what it is    Patient Stated Goals Want to get the use of my L hand -  and pain better - need to get better thing , using crutches , open packages, tying my shoe on my R foot   Currently in Pain? No/denies            [facility] OT Assessment - 07/19/16 0001      Strength   Right Hand Grip (lbs) 38   Right Hand Lateral Pinch 12 lbs   Right Hand 3 Point Pinch 13 lbs   Left Hand Grip (lbs) 35   Left Hand Lateral  Pinch 7 lbs   Left Hand 3 Point Pinch 9 lbs     Left Hand AROM   L Thumb MCP 0-60 45 Degrees          Measured  grip and prehension strength, ROM for thumb and wrist  - see flowsheet Assess scar and ROM in wrist and thumb   Opposition - to base of 5th  rubber band for thumb PA and RA - 10 reps no pain - cont while doing putty HEP  Teal putty  for grip , lat and 3 point grip  10 reps  No pain  Upgrade to 2 lbs  For wrist ext, flexion , RD , UD ,sup ,pro   no Pain  10 reps    Pt to do 10 reps 2 x day -  And can increase to  2 sets in  3 days if no increase pain  2 x day                    OT Education - 07/19/16 1544    Education provided Yes   Education Details discharge instructions    Person(s) Educated Patient   Methods Explanation;Demonstration;Tactile cues;Verbal cues   Comprehension Verbal cues required;Returned demonstration;Verbalized understanding           OT Short Term Goals - 07/19/16 1545      OT SHORT TERM GOAL #1   Title Pain on PRWHE improve with more than 12 points o   Baseline Pain on PRWHE at eval 22/50 - and now 5/50   Status Achieved     OT SHORT TERM GOAL #2   Title AROM for L thumb MP and IP flexion , PA and RA improve with more than 5 degrees to do buttons and shoelaces with more ease    Status Achieved     OT SHORT TERM GOAL #3   Title L wrist AROM improve to WNL to push up from chair, turn doorknob , use hand in bathing more than 50%    Status Achieved     OT SHORT TERM GOAL #4   Title Pt to be ind in HEP to increase ROM , decrease pain , and be able to wean out of  prefab thumb spica    Status Achieved           OT Long Term Goals - 07/19/16 1546      OT LONG TERM GOAL #1   Title L thumb strength increase for pt to use thumb in ADL's with no increase in symptoms    Status Achieved     OT LONG TERM GOAL #2   Title Grip in L hand increase to more than 50% compare to R to carry more than 5 lbs , squeeze washcloth    Baseline see flowsheet    Status Achieved     OT LONG TERM GOAL #3   Title L prehension strength increase to more than 50% compare to R hand to carry plate, cut with knife and fork, ty shoes   Baseline see flowsheet   Status Achieved               Plan - 07/19/16 1545    Clinical Impression Statement Pt made great progress since start- show better strength , increase ROM - no pain report - and able to do most all tasks - she able to use [facility] neoprene splint with crutch - did pad her handle - pt had some irritations  over 4th [facility] - pt can be discharge  with HEP    OT Treatment/Interventions Self-care/ADL training;Fluidtherapy;Splinting;Patient/family education;Therapeutic exercises;Ultrasound;Passive range of motion;Manual Therapy;Parrafin   Plan discharge with HEP    OT Home Exercise Plan see pt instruction   Consulted and Agree with Plan of Care Patient      Patient will benefit from  skilled therapeutic intervention in order to improve the following deficits and impairments:     Visit Diagnosis: Stiffness of left hand, not elsewhere classified  Stiffness of left wrist, not elsewhere classified  Pain in left hand  Muscle weakness (generalized)    Problem List Patient Active Problem List   Diagnosis Date Noted  . Hiatal hernia 04/11/2016  . Anxiety   . OA (osteoarthritis)   . Hyperlipidemia   . Hypertension   . Post-polio syndrome   . Lung nodule   . Esophageal dysmotility   . Incontinence of urine   . Obesity   . GERD (gastroesophageal reflux disease) 11/05/2012  . Pulmonary nodule 11/05/2012  . Shingles 01/22/2007    Rosalyn Gess OTR/L,CLT 07/19/2016, 3:57 PM  [place] [facility] [address] [place], [address] Phone: [phone]   Fax:  [phone]  Name: Susan Shah MRN: [id number] Date of Birth: [birth year]

## 2016-07-19 NOTE — Patient Instructions (Signed)
Pt to cont with  2 lbs weight for wrist in all planes  And teal putty for gripping , 3 point and lat grip  -  2 x day - can increase in 3 days to 2 sets  Cont with HEP for another 2 wks - and then stop gradually - Cont with joint protection

## 2016-07-22 DIAGNOSIS — K440 Diaphragmatic hernia with obstruction, without gangrene: Secondary | ICD-10-CM | POA: Diagnosis not present

## 2016-07-22 DIAGNOSIS — K219 Gastro-esophageal reflux disease without esophagitis: Secondary | ICD-10-CM | POA: Diagnosis not present

## 2016-07-22 DIAGNOSIS — K449 Diaphragmatic hernia without obstruction or gangrene: Secondary | ICD-10-CM | POA: Diagnosis not present

## 2016-07-30 DIAGNOSIS — Z87891 Personal history of nicotine dependence: Secondary | ICD-10-CM | POA: Diagnosis not present

## 2016-07-30 DIAGNOSIS — I959 Hypotension, unspecified: Secondary | ICD-10-CM | POA: Diagnosis not present

## 2016-07-30 DIAGNOSIS — K219 Gastro-esophageal reflux disease without esophagitis: Secondary | ICD-10-CM | POA: Diagnosis not present

## 2016-07-30 DIAGNOSIS — K224 Dyskinesia of esophagus: Secondary | ICD-10-CM | POA: Diagnosis not present

## 2016-07-30 DIAGNOSIS — R1012 Left upper quadrant pain: Secondary | ICD-10-CM | POA: Diagnosis not present

## 2016-07-30 DIAGNOSIS — K449 Diaphragmatic hernia without obstruction or gangrene: Secondary | ICD-10-CM | POA: Diagnosis not present

## 2016-08-14 DIAGNOSIS — G4733 Obstructive sleep apnea (adult) (pediatric): Secondary | ICD-10-CM | POA: Diagnosis not present

## 2016-08-19 DIAGNOSIS — G4733 Obstructive sleep apnea (adult) (pediatric): Secondary | ICD-10-CM | POA: Diagnosis not present

## 2016-08-19 DIAGNOSIS — K449 Diaphragmatic hernia without obstruction or gangrene: Secondary | ICD-10-CM | POA: Diagnosis not present

## 2016-08-19 DIAGNOSIS — J45991 Cough variant asthma: Secondary | ICD-10-CM | POA: Diagnosis not present

## 2016-08-19 DIAGNOSIS — K219 Gastro-esophageal reflux disease without esophagitis: Secondary | ICD-10-CM | POA: Diagnosis not present

## 2016-08-20 DIAGNOSIS — E669 Obesity, unspecified: Secondary | ICD-10-CM | POA: Diagnosis not present

## 2016-08-20 DIAGNOSIS — G14 Postpolio syndrome: Secondary | ICD-10-CM | POA: Diagnosis not present

## 2016-08-20 DIAGNOSIS — M1990 Unspecified osteoarthritis, unspecified site: Secondary | ICD-10-CM | POA: Diagnosis not present

## 2016-08-20 DIAGNOSIS — E785 Hyperlipidemia, unspecified: Secondary | ICD-10-CM | POA: Diagnosis not present

## 2016-08-20 DIAGNOSIS — K219 Gastro-esophageal reflux disease without esophagitis: Secondary | ICD-10-CM | POA: Diagnosis not present

## 2016-08-20 DIAGNOSIS — F329 Major depressive disorder, single episode, unspecified: Secondary | ICD-10-CM | POA: Diagnosis not present

## 2016-08-20 DIAGNOSIS — E786 Lipoprotein deficiency: Secondary | ICD-10-CM | POA: Diagnosis not present

## 2016-08-20 DIAGNOSIS — I10 Essential (primary) hypertension: Secondary | ICD-10-CM | POA: Diagnosis not present

## 2016-09-03 DIAGNOSIS — K219 Gastro-esophageal reflux disease without esophagitis: Secondary | ICD-10-CM | POA: Diagnosis not present

## 2016-09-03 DIAGNOSIS — R1012 Left upper quadrant pain: Secondary | ICD-10-CM | POA: Diagnosis not present

## 2016-09-03 DIAGNOSIS — Z01818 Encounter for other preprocedural examination: Secondary | ICD-10-CM | POA: Diagnosis not present

## 2016-09-03 DIAGNOSIS — K449 Diaphragmatic hernia without obstruction or gangrene: Secondary | ICD-10-CM | POA: Diagnosis not present

## 2016-09-10 DIAGNOSIS — G4730 Sleep apnea, unspecified: Secondary | ICD-10-CM | POA: Diagnosis present

## 2016-09-10 DIAGNOSIS — Z87891 Personal history of nicotine dependence: Secondary | ICD-10-CM | POA: Diagnosis not present

## 2016-09-10 DIAGNOSIS — J45909 Unspecified asthma, uncomplicated: Secondary | ICD-10-CM | POA: Diagnosis present

## 2016-09-10 DIAGNOSIS — K219 Gastro-esophageal reflux disease without esophagitis: Secondary | ICD-10-CM | POA: Diagnosis not present

## 2016-09-10 DIAGNOSIS — Z6832 Body mass index (BMI) 32.0-32.9, adult: Secondary | ICD-10-CM | POA: Diagnosis not present

## 2016-09-10 DIAGNOSIS — E669 Obesity, unspecified: Secondary | ICD-10-CM | POA: Diagnosis present

## 2016-09-10 DIAGNOSIS — K449 Diaphragmatic hernia without obstruction or gangrene: Secondary | ICD-10-CM | POA: Diagnosis not present

## 2016-09-10 DIAGNOSIS — E7800 Pure hypercholesterolemia, unspecified: Secondary | ICD-10-CM | POA: Diagnosis present

## 2016-09-10 DIAGNOSIS — I10 Essential (primary) hypertension: Secondary | ICD-10-CM | POA: Diagnosis present

## 2016-09-17 ENCOUNTER — Encounter: Payer: Self-pay | Admitting: Emergency Medicine

## 2016-09-17 ENCOUNTER — Emergency Department
Admission: EM | Admit: 2016-09-17 | Discharge: 2016-09-17 | Disposition: A | Discharge status: Home / Self Care | Payer: Medicare Other | Attending: Emergency Medicine | Admitting: Emergency Medicine

## 2016-09-17 ENCOUNTER — Emergency Department: Payer: Medicare Other

## 2016-09-17 DIAGNOSIS — Z87891 Personal history of nicotine dependence: Secondary | ICD-10-CM | POA: Diagnosis not present

## 2016-09-17 DIAGNOSIS — J45909 Unspecified asthma, uncomplicated: Secondary | ICD-10-CM | POA: Insufficient documentation

## 2016-09-17 DIAGNOSIS — Z7982 Long term (current) use of aspirin: Secondary | ICD-10-CM | POA: Insufficient documentation

## 2016-09-17 DIAGNOSIS — I10 Essential (primary) hypertension: Secondary | ICD-10-CM | POA: Diagnosis not present

## 2016-09-17 DIAGNOSIS — Z79899 Other long term (current) drug therapy: Secondary | ICD-10-CM | POA: Diagnosis not present

## 2016-09-17 DIAGNOSIS — M79605 Pain in left leg: Secondary | ICD-10-CM | POA: Diagnosis not present

## 2016-09-17 DIAGNOSIS — M79662 Pain in left lower leg: Secondary | ICD-10-CM | POA: Diagnosis not present

## 2016-09-17 NOTE — Discharge Instructions (Signed)
Continue to monitor left leg pain if symptoms persist follow up with her primary care doctor if symptoms acutely worsen return to the emergency department.  The results of your ultrasound were negative. There were no findings of a DVT in her left lower extremity.

## 2016-09-17 NOTE — ED Provider Notes (Signed)
[facility] Emergency Department Provider Note   ____________________________________________   I have reviewed the triage vital signs and the nursing notes.   HISTORY  Chief Complaint Leg Pain    HPI Susan Shah is a 71 y.o. female presents to the emergency department with left lower leg and calf pain. Patient was seen at the [facility] Urgent care where she was referred to the emergency department to be evaluated for possible DVT. Patient recently admitted at [facility] for hernia repair. Patient offers no other risk factors contributing to likelihood of DVT in the left lower extremity.   Patient denies fever, chills, headache, vision changes, chest pain, chest tightness, shortness of breath, abdominal pain, nausea and vomiting.  Past Medical History:  Diagnosis Date  . Anxiety   . Asthma 2015   mild, seasonal allergy triggered.  . Cancer ([place]) 2013   skin cancer  on left hand  . Depression   . Esophageal dysmotility   . GERD (gastroesophageal reflux disease) 11/05/2012  . History of hiatal hernia   . Hyperlipidemia   . Hypertension   . Incontinence of urine   . Lung nodule   . OA (osteoarthritis)   . Obesity   . PONV (postoperative nausea and vomiting)   . Post-polio syndrome    contracted at 33 months old  . Pulmonary nodule 11/05/2012   RML  . Shingles 2009    Patient Active Problem List   Diagnosis Date Noted  . Hiatal hernia 04/11/2016  . Anxiety   . OA (osteoarthritis)   . Hyperlipidemia   . Hypertension   . Post-polio syndrome   . Lung nodule   . Esophageal dysmotility   . Incontinence of urine   . Obesity   . GERD (gastroesophageal reflux disease) 11/05/2012  . Pulmonary nodule 11/05/2012  . Shingles 01/22/2007    Past Surgical History:  Procedure Laterality Date  . [place] STUDY N/A 04/24/2016   Procedure: 71 HOUR PH STUDY;  Surgeon: Lucilla Lame, MD;  Location: [facility] ENDOSCOPY;  Service: Endoscopy;   Laterality: N/A;  . ABDOMINAL HYSTERECTOMY     Total  . APPENDECTOMY    . CARPOMETACARPAL ([place]) FUSION OF THUMB Left 05/16/2016   Procedure: CARPOMETACARPAL [facility]) FUSION OF THUMB;  Surgeon: Hessie Knows, MD;  Location: [facility] ORS;  Service: Orthopedics;  Laterality: Left;  . CATARACT EXTRACTION Bilateral 12/2012  . CHOLECYSTECTOMY    . COLONOSCOPY  2003  . ESOPHAGEAL MANOMETRY N/A 04/24/2016   Procedure: ESOPHAGEAL MANOMETRY (EM);  Surgeon: Lucilla Lame, MD;  Location: [facility] ENDOSCOPY;  Service: Endoscopy;  Laterality: N/A;  . ESOPHAGOGASTRODUODENOSCOPY  08/2011  . FOOT SURGERY    . HALLUX VALGUS CORRECTION    . HIP SURGERY Right 1950   tendon release r/t polio  . KNEE ARTHROSCOPY Left 06/23/2008  . RETINAL DETACHMENT SURGERY Right 03/2014  . ROTATOR CUFF REPAIR Bilateral   . URINARY SURGERY  2014   [place]    Prior to Admission medications   Medication Sig Start Date End Date Taking? Authorizing Provider  aspirin EC 81 MG tablet Take 81 mg by mouth at bedtime.     [provider]  buPROPion (WELLBUTRIN XL) 150 MG 24 hr tablet Take 150 mg by mouth daily.    [provider]  Cholecalciferol (VITAMIN D) 2000 units tablet Take 2,000 Units by mouth daily.    [provider]  cyanocobalamin 500 MCG tablet Take 500 mcg by mouth daily.    [provider]  diclofenac (VOLTAREN) 50 MG EC tablet Take 50 mg by mouth 2 (two) times daily as needed for mild pain.    [provider]  escitalopram (LEXAPRO) 10 MG tablet Take 10 mg by mouth at bedtime.    [provider]  HYDROcodone-acetaminophen (NORCO) 5-325 MG tablet Take 1-2 tablets by mouth every 6 (six) hours as needed. 05/16/16   Hessie Knows, MD  HYDROcodone-acetaminophen (NORCO) 5-325 MG tablet Take 1-2 tablets by mouth every 6 (six) hours as needed for moderate pain. 05/16/16   Hessie Knows, MD  loratadine (CLARITIN) 10 MG tablet Take 10 mg by mouth daily as needed for allergies.     [provider]  losartan-hydrochlorothiazide (HYZAAR) 100-25 MG tablet Take 1 tablet by mouth daily. In am.    [provider]  metoCLOPramide (REGLAN) 5 MG tablet Take 5 mg by mouth 2 (two) times daily.    [provider]  metoprolol (LOPRESSOR) 50 MG tablet Take 50 mg by mouth 2 (two) times daily with a meal.    [provider]  oxybutynin (DITROPAN-XL) 10 MG 24 hr tablet Take 20 mg by mouth daily.     [provider]  pantoprazole (PROTONIX) 40 MG tablet Take 40 mg by mouth 2 (two) times daily before a meal.    [provider]  potassium chloride SA (K-DUR,KLOR-CON) 20 MEQ tablet Take 20 mEq by mouth daily.    [provider]  ranitidine (ZANTAC) 150 MG tablet Take 150 mg by mouth daily as needed for heartburn.     [provider]  simvastatin (ZOCOR) 20 MG tablet Take 20 mg by mouth daily at 6 PM.    [provider]  traZODone (DESYREL) 50 MG tablet Take 50 mg by mouth at bedtime. 1-2 tablets as needed for sleep.    [provider]    Allergies Soma [carisoprodol]  Family History  Problem Relation Age of Onset  . Heart disease Mother   . Heart disease Father   . Heart disease Brother   . Stroke Maternal Grandmother   . Heart disease Maternal Grandfather   . Heart attack Brother     Social History Social History  Substance Use Topics  . Smoking status: Former Smoker    Quit date: 05/09/1968  . Smokeless tobacco: Never Used  . Alcohol use No    Review of Systems Constitutional: Negative for fever/chills Eyes: No visual changes. ENT:  Negative for sore throat and for difficulty swallowing Cardiovascular: Denies chest pain. Respiratory: Denies cough. Denies shortness of breath. Gastrointestinal: No abdominal pain.  No nausea, vomiting, diarrhea. Genitourinary: Negative for dysuria. Musculoskeletal: Positive for left lower leg pain. Skin: Negative for rash. Neurological: Negative for  headaches.  ____________________________________________   PHYSICAL EXAM:  VITAL SIGNS: ED Triage Vitals  Enc Vitals Group     BP 09/17/16 1318 (!) 174/65     Pulse Rate 09/17/16 1317 65     Resp 09/17/16 1317 18     Temp 09/17/16 1317 98.5 F (36.9 C)     Temp Source 09/17/16 1317 Oral     SpO2 09/17/16 1317 96 %     Weight 09/17/16 1316 180 lb (81.6 kg)     Height 09/17/16 1316 5\' 2"  (1.575 m)     Head Circumference --      Peak Flow --      Pain Score 09/17/16 1316 0     Pain Loc --      Pain Edu? --  Excl. in [place]? --     Constitutional: Alert and oriented. Well appearing and in no acute distress.  Eyes: Conjunctivae are normal. PERRL. EOMI  Head: Normocephalic and atraumatic. ENT:      Ears: Canals clear. TMs intact bilaterally.      Nose: No congestion/rhinnorhea.      Mouth/Throat: Mucous membranes are moist.  Neck:Supple. No thyromegaly. No stridor.  Cardiovascular: Normal rate, regular rhythm. Normal S1 and S2.  Good peripheral circulation. Respiratory: Normal respiratory effort without tachypnea or retractions. Lungs CTAB. No wheezes/rales/rhonchi. Good air entry to the bases with no decreased or absent breath sounds. Hematological/Lymphatic/Immunological: No cervical lymphadenopathy. Cardiovascular: Normal rate, regular rhythm. Normal distal pulses. Gastrointestinal: Bowel sounds 4 quadrants. Soft and nontender to palpation. Musculoskeletal:  Left lower leg and calf pain. Intact sensation and movement of the left lower extremity. Nontender with normal range of motion in all extremities. Neurologic: Normal speech and language. No gross focal neurologic deficits are appreciated. Skin:  Skin is warm, dry and intact. No rash noted. Psychiatric: Mood and affect are normal. Speech and behavior are normal. Patient exhibits appropriate insight and judgement.  ____________________________________________   LABS (all labs ordered are listed, but only abnormal results  are displayed)  Labs Reviewed - No data to display ____________________________________________  EKG  none  ____________________________________________  RADIOLOGY [place] IMG LOWER UNILATERAL LEFT  FINDINGS: Contralateral Common Femoral Vein: Respiratory phasicity is normal and symmetric with the symptomatic side. No evidence of thrombus. Normal compressibility.  Common Femoral Vein: No evidence of thrombus. Normal compressibility, respiratory phasicity and response to augmentation.  Saphenofemoral Junction: No evidence of thrombus. Normal compressibility and flow on color Doppler imaging.  Profunda Femoral Vein: No evidence of thrombus. Normal compressibility and flow on color Doppler imaging.  Femoral Vein: No evidence of thrombus. Normal compressibility, respiratory phasicity and response to augmentation.  Popliteal Vein: No evidence of thrombus. Normal compressibility, respiratory phasicity and response to augmentation.  Calf Veins: No evidence of thrombus. Normal compressibility and flow on color Doppler imaging.  Venous Reflux: None.  Other Findings: None.  IMPRESSION: No evidence of left lower extremity deep venous thrombosis. ____________________________________________   PROCEDURES  Procedure(s) performed: no    Critical Care performed: no ____________________________________________   INITIAL IMPRESSION / ASSESSMENT AND PLAN / ED COURSE  Pertinent labs & imaging results that were available during my care of the patient were reviewed by me and considered in my medical decision making (see chart for details).  Patient presents to emergency department with left lower leg and calf pain that developed after having hernia repair. History, physical exam findings and imaging are reassuring left lower extreme pain is not associated with a DVT. Ultrasound imaging was negative for DVT. Recommended patient continue to monitor left lower extreme pain, advised her  to follow-up with her primary care if pain persisted. If pain significantly worsened return to the emergency department. Patient informed of clinical course, understand medical decision-making process, and agree with plan. ____________________________________________   FINAL CLINICAL IMPRESSION(S) / ED DIAGNOSES  Final diagnoses:  Left leg pain       NEW MEDICATIONS STARTED DURING THIS VISIT:  New Prescriptions   No medications on file     Note:  This document was prepared using Dragon voice recognition software and may include unintentional dictation errors.    Jerolyn Shin, PA-C 09/17/16 1501    Earleen Newport, MD 09/17/16 [phone]

## 2016-09-17 NOTE — ED Triage Notes (Signed)
Pt went to [facility] for left calf pain. Surgery 2 days ago for hernia. Sent for r/o DVt. No pain at all currently. Unlabored respirations. Skin warm and dry.

## 2016-09-17 NOTE — ED Notes (Signed)
See triage note  Developed pain to left calf area  States she had recent surgery  Denies any pain at present  No redness

## 2016-09-17 NOTE — ED Triage Notes (Signed)
FIRST NURSE NOTE-sent for r/o dvt.

## 2016-09-24 DIAGNOSIS — Z8719 Personal history of other diseases of the digestive system: Secondary | ICD-10-CM | POA: Diagnosis not present

## 2016-09-24 DIAGNOSIS — K5900 Constipation, unspecified: Secondary | ICD-10-CM | POA: Diagnosis not present

## 2016-09-24 DIAGNOSIS — Z48815 Encounter for surgical aftercare following surgery on the digestive system: Secondary | ICD-10-CM | POA: Diagnosis not present

## 2016-10-24 DIAGNOSIS — Z23 Encounter for immunization: Secondary | ICD-10-CM | POA: Diagnosis not present

## 2016-11-12 DIAGNOSIS — N393 Stress incontinence (female) (male): Secondary | ICD-10-CM | POA: Diagnosis not present

## 2016-11-12 DIAGNOSIS — I10 Essential (primary) hypertension: Secondary | ICD-10-CM | POA: Diagnosis not present

## 2016-11-12 DIAGNOSIS — K449 Diaphragmatic hernia without obstruction or gangrene: Secondary | ICD-10-CM | POA: Diagnosis not present

## 2016-11-12 DIAGNOSIS — K219 Gastro-esophageal reflux disease without esophagitis: Secondary | ICD-10-CM | POA: Diagnosis not present

## 2016-11-25 DIAGNOSIS — R5383 Other fatigue: Secondary | ICD-10-CM | POA: Diagnosis not present

## 2016-11-25 DIAGNOSIS — I10 Essential (primary) hypertension: Secondary | ICD-10-CM | POA: Diagnosis not present

## 2016-11-25 DIAGNOSIS — E782 Mixed hyperlipidemia: Secondary | ICD-10-CM | POA: Diagnosis not present

## 2016-11-25 DIAGNOSIS — Z0001 Encounter for general adult medical examination with abnormal findings: Secondary | ICD-10-CM | POA: Diagnosis not present

## 2016-12-09 DIAGNOSIS — K449 Diaphragmatic hernia without obstruction or gangrene: Secondary | ICD-10-CM | POA: Diagnosis not present

## 2016-12-09 DIAGNOSIS — K219 Gastro-esophageal reflux disease without esophagitis: Secondary | ICD-10-CM | POA: Diagnosis not present

## 2016-12-09 DIAGNOSIS — G4733 Obstructive sleep apnea (adult) (pediatric): Secondary | ICD-10-CM | POA: Diagnosis not present

## 2016-12-09 DIAGNOSIS — J45991 Cough variant asthma: Secondary | ICD-10-CM | POA: Diagnosis not present

## 2016-12-27 DIAGNOSIS — Z1231 Encounter for screening mammogram for malignant neoplasm of breast: Secondary | ICD-10-CM | POA: Diagnosis not present

## 2017-01-22 ENCOUNTER — Ambulatory Visit: Payer: Self-pay

## 2017-01-29 ENCOUNTER — Ambulatory Visit (INDEPENDENT_AMBULATORY_CARE_PROVIDER_SITE_OTHER): Payer: Medicare Other

## 2017-01-29 DIAGNOSIS — G4733 Obstructive sleep apnea (adult) (pediatric): Secondary | ICD-10-CM

## 2017-02-06 ENCOUNTER — Other Ambulatory Visit: Payer: Self-pay

## 2017-02-06 MED ORDER — SIMVASTATIN 20 MG PO TABS: 20.0000 mg | ORAL_TABLET | Freq: Every day | ORAL | 1 refills | Status: DC

## 2017-02-07 ENCOUNTER — Other Ambulatory Visit: Payer: Self-pay | Admitting: Internal Medicine

## 2017-02-24 DIAGNOSIS — S67194A Crushing injury of right ring finger, initial encounter: Secondary | ICD-10-CM | POA: Diagnosis not present

## 2017-02-24 DIAGNOSIS — M189 Osteoarthritis of first carpometacarpal joint, unspecified: Secondary | ICD-10-CM | POA: Diagnosis not present

## 2017-02-24 DIAGNOSIS — S60041A Contusion of right ring finger without damage to nail, initial encounter: Secondary | ICD-10-CM | POA: Diagnosis not present

## 2017-03-04 ENCOUNTER — Other Ambulatory Visit: Payer: Self-pay | Admitting: Internal Medicine

## 2017-03-05 ENCOUNTER — Other Ambulatory Visit: Payer: Self-pay

## 2017-03-05 MED ORDER — METOPROLOL TARTRATE 50 MG PO TABS: 50.0000 mg | ORAL_TABLET | Freq: Two times a day (BID) | ORAL | 3 refills | Status: DC

## 2017-03-05 NOTE — Progress Notes (Signed)
95 percentile pressure 10   95th percentile leak .21    apnea-hypopnea index  3.7 /hr   total days used  >4 hr 62 days  total days used <4 hr 71 days  Total compliance 47.2 percent  Pt stated she hears music thru the cpap.

## 2017-03-07 ENCOUNTER — Other Ambulatory Visit: Payer: Self-pay

## 2017-03-07 MED ORDER — BUPROPION HCL ER (XL) 150 MG PO TB24: 150.0000 mg | ORAL_TABLET | ORAL | 1 refills | Status: DC

## 2017-03-13 ENCOUNTER — Ambulatory Visit (INDEPENDENT_AMBULATORY_CARE_PROVIDER_SITE_OTHER): Payer: Medicare Other | Admitting: Nurse Practitioner

## 2017-03-13 ENCOUNTER — Encounter: Payer: Self-pay | Admitting: Nurse Practitioner

## 2017-03-13 VITALS — BP 135/66 | HR 69 | Resp 16 | Ht 63.0 in | Wt 176.4 lb

## 2017-03-13 DIAGNOSIS — F329 Major depressive disorder, single episode, unspecified: Secondary | ICD-10-CM | POA: Diagnosis not present

## 2017-03-13 DIAGNOSIS — F32A Depression, unspecified: Secondary | ICD-10-CM

## 2017-03-13 DIAGNOSIS — I10 Essential (primary) hypertension: Secondary | ICD-10-CM | POA: Diagnosis not present

## 2017-03-13 DIAGNOSIS — R32 Unspecified urinary incontinence: Secondary | ICD-10-CM | POA: Diagnosis not present

## 2017-03-13 DIAGNOSIS — K219 Gastro-esophageal reflux disease without esophagitis: Secondary | ICD-10-CM

## 2017-03-13 NOTE — Progress Notes (Signed)
[facility] [place], [place] [address]  Internal MEDICINE  Office Visit Note  Patient Name: Susan Shah  [id number]  [id number]  Date of Service: 03/23/2017  Chief Complaint  Patient presents with  . Hypertension    Hypertension  This is a chronic problem. The current episode started more than 1 year ago. The problem is unchanged. The problem is controlled. Associated symptoms include anxiety. Pertinent negatives include no chest pain, headaches, neck pain, palpitations or shortness of breath. There are no associated agents to hypertension. Risk factors for coronary artery disease include dyslipidemia and post-menopausal state. Past treatments include angiotensin blockers, diuretics and lifestyle changes. The current treatment provides moderate improvement. There are no compliance problems.     Pt is here for routine follow up.    Current Medication: Outpatient Encounter Medications as of 03/13/2017  Medication Sig  . aspirin EC 81 MG tablet Take 81 mg by mouth at bedtime.   Marland Kitchen buPROPion (WELLBUTRIN XL) 150 MG 24 hr tablet Take 1 tablet (150 mg total) by mouth every morning.  . Cholecalciferol (VITAMIN D) 2000 units tablet Take 2,000 Units by mouth daily.  . cyanocobalamin 500 MCG tablet Take 500 mcg by mouth daily.  . diclofenac (VOLTAREN) 50 MG EC tablet Take 50 mg by mouth 2 (two) times daily as needed for mild pain.  Marland Kitchen escitalopram (LEXAPRO) 10 MG tablet Take 10 mg by mouth at bedtime.  Marland Kitchen loratadine (CLARITIN) 10 MG tablet Take 10 mg by mouth daily as needed for allergies.  Marland Kitchen losartan-hydrochlorothiazide (HYZAAR) 100-25 MG tablet Take 1 tablet by mouth daily. In am.  . meclizine (ANTIVERT) 25 MG tablet Take 25 mg by mouth 3 (three) times daily.  . metoCLOPramide (REGLAN) 5 MG tablet Take 5 mg by mouth 2 (two) times daily.  . metoprolol tartrate (LOPRESSOR) 50 MG tablet Take 1 tablet (50 mg total) by mouth 2 (two) times daily with a meal.  . oxybutynin  (DITROPAN-XL) 10 MG 24 hr tablet TAKE TWO TABLETS BY MOUTH DAILY  . pantoprazole (PROTONIX) 40 MG tablet TAKE ONE TABLET BY MOUTH TWICE DAILY  . potassium chloride SA (K-DUR,KLOR-CON) 20 MEQ tablet Take 20 mEq by mouth daily.  . ranitidine (ZANTAC) 150 MG tablet Take 150 mg by mouth daily as needed for heartburn.   . simvastatin (ZOCOR) 20 MG tablet Take 1 tablet (20 mg total) by mouth daily at 6 PM.  . traZODone (DESYREL) 50 MG tablet Take 50 mg by mouth at bedtime. 1-2 tablets as needed for sleep.  . [DISCONTINUED] HYDROcodone-acetaminophen (NORCO) 5-325 MG tablet Take 1-2 tablets by mouth every 6 (six) hours as needed. (Patient not taking: Reported on 03/13/2017)  . [DISCONTINUED] HYDROcodone-acetaminophen (NORCO) 5-325 MG tablet Take 1-2 tablets by mouth every 6 (six) hours as needed for moderate pain. (Patient not taking: Reported on 03/13/2017)   No facility-administered encounter medications on file as of 03/13/2017.     Surgical History: Past Surgical History:  Procedure Laterality Date  . [place] STUDY N/A 04/24/2016   Procedure: 92 HOUR PH STUDY;  Surgeon: Lucilla Lame, MD;  Location: [facility] ENDOSCOPY;  Service: Endoscopy;  Laterality: N/A;  . ABDOMINAL HYSTERECTOMY     Total  . APPENDECTOMY    . CARPOMETACARPAL ([place]) FUSION OF THUMB Left 05/16/2016   Procedure: CARPOMETACARPAL [employer] [facility] [employer]) FUSION OF THUMB;  Surgeon: Hessie Knows, MD;  Location: [facility] ORS;  Service: Orthopedics;  Laterality: Left;  . CATARACT EXTRACTION Bilateral 12/2012  . CHOLECYSTECTOMY    .  COLONOSCOPY  2003  . ESOPHAGEAL MANOMETRY N/A 04/24/2016   Procedure: ESOPHAGEAL MANOMETRY (EM);  Surgeon: Lucilla Lame, MD;  Location: [facility] ENDOSCOPY;  Service: Endoscopy;  Laterality: N/A;  . ESOPHAGOGASTRODUODENOSCOPY  08/2011  . FOOT SURGERY    . HALLUX VALGUS CORRECTION    . HIP SURGERY Right 1950   tendon release r/t polio  . KNEE ARTHROSCOPY Left 06/23/2008  . RETINAL DETACHMENT SURGERY Right 03/2014  . ROTATOR CUFF REPAIR  Bilateral   . URINARY SURGERY  2014   [place]    Medical History: Past Medical History:  Diagnosis Date  . Anxiety   . Asthma 2015   mild, seasonal allergy triggered.  . Cancer ([place]) 2013   skin cancer  on left hand  . Depression   . Esophageal dysmotility   . GERD (gastroesophageal reflux disease) 11/05/2012  . History of hiatal hernia   . Hyperlipidemia   . Hypertension   . Incontinence of urine   . Lung nodule   . OA (osteoarthritis)   . Obesity   . PONV (postoperative nausea and vomiting)   . Post-polio syndrome    contracted at 27 months old  . Pulmonary nodule 11/05/2012   RML  . Shingles 2009    Family History: Family History  Problem Relation Age of Onset  . Heart disease Mother   . Heart disease Father   . Heart disease Brother   . Stroke Maternal Grandmother   . Heart disease Maternal Grandfather   . Heart attack Brother     Social History   Socioeconomic History  . Marital status: Married    Spouse name: Not on file  . Number of children: Not on file  . Years of education: Not on file  . Highest education level: Not on file  Social Needs  . Financial resource strain: Not on file  . Food insecurity - worry: Not on file  . Food insecurity - inability: Not on file  . Transportation needs - medical: Not on file  . Transportation needs - non-medical: Not on file  Occupational History  . Not on file  Tobacco Use  . Smoking status: Former Smoker    Last attempt to quit: 05/09/1968    Years since quitting: 48.9  . Smokeless tobacco: Never Used  Substance and Sexual Activity  . Alcohol use: No  . Drug use: No  . Sexual activity: Not on file  Other Topics Concern  . Not on file  Social History Narrative  . Not on file      Review of Systems  Constitutional: Negative for activity change, chills, fatigue and unexpected weight change.  HENT: Negative for congestion, postnasal drip, rhinorrhea, sneezing and sore throat.   Eyes: Negative.   Negative for redness.  Respiratory: Negative for cough, chest tightness and shortness of breath.   Cardiovascular: Negative for chest pain and palpitations.  Gastrointestinal: Negative for abdominal distention, abdominal pain, constipation, diarrhea, nausea and vomiting.  Endocrine: Negative for cold intolerance, heat intolerance, polydipsia, polyphagia and polyuria.  Genitourinary: Negative for dysuria and frequency.  Musculoskeletal: Negative for arthralgias, back pain, joint swelling and neck pain.  Skin: Negative for rash.  Allergic/Immunologic: Negative for environmental allergies.  Neurological: Negative for tremors, numbness and headaches.  Hematological: Negative for adenopathy. Does not bruise/bleed easily.  Psychiatric/Behavioral: Negative for behavioral problems (Depression), sleep disturbance and suicidal ideas. The patient is not nervous/anxious.     Today's Vitals   03/13/17 1119  BP: 135/66  Pulse: 69  Resp:  16  SpO2: 94%  Weight: 176 lb 6.4 oz (80 kg)  Height: 5\' 3"  (1.6 m)    Physical Exam  Constitutional: She is oriented to person, place, and time. She appears well-developed and well-nourished. No distress.  HENT:  Head: Normocephalic and atraumatic.  Mouth/Throat: Oropharynx is clear and moist. No oropharyngeal exudate.  Eyes: EOM are normal. Pupils are equal, round, and reactive to light.  Neck: Normal range of motion. Neck supple. No JVD present. No tracheal deviation present. No thyromegaly present.  Cardiovascular: Normal rate, regular rhythm and normal heart sounds. Exam reveals no gallop and no friction rub.  No murmur heard. Pulmonary/Chest: Effort normal and breath sounds normal. No respiratory distress. She has no wheezes. She has no rales. She exhibits no tenderness.  Abdominal: Soft. Bowel sounds are normal. There is no tenderness.  Musculoskeletal: Normal range of motion.  Lymphadenopathy:    She has no cervical adenopathy.  Neurological: She is  alert and oriented to person, place, and time. No cranial nerve deficit.  Skin: Skin is warm and dry. She is not diaphoretic.  Psychiatric: She has a normal mood and affect. Her behavior is normal. Judgment and thought content normal.  Nursing note and vitals reviewed.  Assessment/Plan: 1. Essential hypertension Stable. Continue bp medication as prescribed.   2. Gastroesophageal reflux disease without esophagitis Stable. Continue pantoprazole as prescribed.   3. Urinary incontinence, unspecified type Doing well on oxybutinin. Continue as prescribed .  4. Depression, unspecified depression type Stable. Continue taking lexapro as prescribed.   General Counseling: Susan Shah verbalizes understanding of the findings of todays visit and agrees with plan of treatment. I have discussed any further diagnostic evaluation that may be needed or ordered today. We also reviewed her medications today. she has been encouraged to call the office with any questions or concerns that should arise related to todays visit.  This patient was seen by Leretha Pol, FNP- C in Collaboration with Dr Lavera Guise as a part of collaborative care agreement    Time spent: 74 Minutes   Dr Lavera Guise Internal medicine

## 2017-03-23 DIAGNOSIS — F32A Depression, unspecified: Secondary | ICD-10-CM | POA: Insufficient documentation

## 2017-03-23 DIAGNOSIS — F329 Major depressive disorder, single episode, unspecified: Secondary | ICD-10-CM | POA: Insufficient documentation

## 2017-04-10 ENCOUNTER — Telehealth: Payer: Self-pay

## 2017-04-10 NOTE — Telephone Encounter (Signed)
Patient called stating her cpap machine was making loud noises. I advised pt to call [facility] [phone] and schd appt for next Wednesday If needed/ [place]

## 2017-04-14 DIAGNOSIS — H9319 Tinnitus, unspecified ear: Secondary | ICD-10-CM | POA: Diagnosis not present

## 2017-04-14 DIAGNOSIS — H903 Sensorineural hearing loss, bilateral: Secondary | ICD-10-CM | POA: Diagnosis not present

## 2017-04-14 DIAGNOSIS — R296 Repeated falls: Secondary | ICD-10-CM | POA: Diagnosis not present

## 2017-04-23 ENCOUNTER — Telehealth: Payer: Self-pay | Admitting: Internal Medicine

## 2017-04-23 NOTE — Telephone Encounter (Signed)
Per [facility], patient bipap machine has been damaged and insurance will not cover cost and per pt will pay out of pocket expenses for repair/ [facility]

## 2017-04-29 ENCOUNTER — Other Ambulatory Visit: Payer: Self-pay | Admitting: Internal Medicine

## 2017-06-09 ENCOUNTER — Ambulatory Visit (INDEPENDENT_AMBULATORY_CARE_PROVIDER_SITE_OTHER): Payer: Medicare Other | Admitting: Internal Medicine

## 2017-06-09 ENCOUNTER — Encounter: Payer: Self-pay | Admitting: Internal Medicine

## 2017-06-09 VITALS — BP 144/71 | HR 62 | Resp 16 | Ht 63.0 in | Wt 170.0 lb

## 2017-06-09 DIAGNOSIS — J4520 Mild intermittent asthma, uncomplicated: Secondary | ICD-10-CM

## 2017-06-09 DIAGNOSIS — K219 Gastro-esophageal reflux disease without esophagitis: Secondary | ICD-10-CM | POA: Diagnosis not present

## 2017-06-09 DIAGNOSIS — Z9989 Dependence on other enabling machines and devices: Secondary | ICD-10-CM

## 2017-06-09 DIAGNOSIS — G4733 Obstructive sleep apnea (adult) (pediatric): Secondary | ICD-10-CM | POA: Diagnosis not present

## 2017-06-09 NOTE — Patient Instructions (Signed)

## 2017-06-09 NOTE — Progress Notes (Signed)
[facility] [place], [place] [address]  Pulmonary Sleep Medicine   Office Visit Note  Patient Name: Susan Shah DOB: 02-Sep-1945 MRN [id number]  Date of Service: 06/09/2017  Complaints/HPI:   Doing very well no admissions to the hospital.  The last CPAP download that she had actually was not as good as previously.  Her numbers were down to 47% compliance.  She states this is because she had a problem with the machine which had to be sent back for repairs.  She states she is going to get the machine back now and she is going to use it more frequently.  Her asthma has been under fairly good control.  She has not had any flare-ups she has not been admitted to the hospital.  The reflux is also under control  ROS  General: (-) fever, (-) chills, (-) night sweats, (-) weakness Skin: (-) rashes, (-) itching,. Eyes: (-) visual changes, (-) redness, (-) itching. Nose and Sinuses: (-) nasal stuffiness or itchiness, (-) postnasal drip, (-) nosebleeds, (-) sinus trouble. Mouth and Throat: (-) sore throat, (-) hoarseness. Neck: (-) swollen glands, (-) enlarged thyroid, (-) neck pain. Respiratory: - cough, (-) bloody sputum, + shortness of breath, - wheezing. Cardiovascular: - ankle swelling, (-) chest pain. Lymphatic: (-) lymph node enlargement. Neurologic: (-) numbness, (-) tingling. Psychiatric: (-) anxiety, (-) depression   Current Medication: Outpatient Encounter Medications as of 06/09/2017  Medication Sig  . aspirin EC 81 MG tablet Take 81 mg by mouth at bedtime.   Marland Kitchen buPROPion (WELLBUTRIN XL) 150 MG 24 hr tablet Take 1 tablet (150 mg total) by mouth every morning.  . Cholecalciferol (VITAMIN D) 2000 units tablet Take 2,000 Units by mouth daily.  . cyanocobalamin 500 MCG tablet Take 500 mcg by mouth daily.  . diclofenac (VOLTAREN) 50 MG EC tablet Take 50 mg by mouth 2 (two) times daily as needed for mild pain.  Marland Kitchen escitalopram (LEXAPRO) 10 MG tablet Take 10 mg  by mouth at bedtime.  Marland Kitchen loratadine (CLARITIN) 10 MG tablet Take 10 mg by mouth daily as needed for allergies.  Marland Kitchen losartan-hydrochlorothiazide (HYZAAR) 100-25 MG tablet Take 1 tablet by mouth daily. In am.  . meclizine (ANTIVERT) 25 MG tablet Take 25 mg by mouth 3 (three) times daily.  . metoCLOPramide (REGLAN) 5 MG tablet TAKE 1 TABLET BY MOUTH TWICE DAILY BEFORE A MEAL AND ONE AT BEDTIME  . metoprolol tartrate (LOPRESSOR) 50 MG tablet Take 1 tablet (50 mg total) by mouth 2 (two) times daily with a meal.  . oxybutynin (DITROPAN-XL) 10 MG 24 hr tablet TAKE TWO TABLETS BY MOUTH DAILY  . pantoprazole (PROTONIX) 40 MG tablet TAKE ONE TABLET BY MOUTH TWICE DAILY  . potassium chloride SA (K-DUR,KLOR-CON) 20 MEQ tablet Take 20 mEq by mouth daily.  . ranitidine (ZANTAC) 150 MG tablet Take 150 mg by mouth daily as needed for heartburn.   . simvastatin (ZOCOR) 20 MG tablet Take 1 tablet (20 mg total) by mouth daily at 6 PM.  . traZODone (DESYREL) 50 MG tablet Take 50 mg by mouth at bedtime. 1-2 tablets as needed for sleep.   No facility-administered encounter medications on file as of 06/09/2017.     Surgical History: Past Surgical History:  Procedure Laterality Date  . [place] STUDY N/A 04/24/2016   Procedure: 22 HOUR PH STUDY;  Surgeon: Lucilla Lame, MD;  Location: [facility] ENDOSCOPY;  Service: Endoscopy;  Laterality: N/A;  . ABDOMINAL HYSTERECTOMY  Total  . APPENDECTOMY    . CARPOMETACARPAL ([place]) FUSION OF THUMB Left 05/16/2016   Procedure: CARPOMETACARPAL [facility]) FUSION OF THUMB;  Surgeon: Hessie Knows, MD;  Location: [facility] ORS;  Service: Orthopedics;  Laterality: Left;  . CATARACT EXTRACTION Bilateral 12/2012  . CHOLECYSTECTOMY    . COLONOSCOPY  2003  . ESOPHAGEAL MANOMETRY N/A 04/24/2016   Procedure: ESOPHAGEAL MANOMETRY (EM);  Surgeon: Lucilla Lame, MD;  Location: [facility] ENDOSCOPY;  Service: Endoscopy;  Laterality: N/A;  . ESOPHAGOGASTRODUODENOSCOPY  08/2011  . FOOT SURGERY    . HALLUX VALGUS  CORRECTION    . HIP SURGERY Right 1950   tendon release r/t polio  . KNEE ARTHROSCOPY Left 06/23/2008  . RETINAL DETACHMENT SURGERY Right 03/2014  . ROTATOR CUFF REPAIR Bilateral   . URINARY SURGERY  2014   [place]    Medical History: Past Medical History:  Diagnosis Date  . Anxiety   . Asthma 2015   mild, seasonal allergy triggered.  . Cancer ([place]) 2013   skin cancer  on left hand  . Depression   . Esophageal dysmotility   . GERD (gastroesophageal reflux disease) 11/05/2012  . History of hiatal hernia   . Hyperlipidemia   . Hypertension   . Incontinence of urine   . Lung nodule   . OA (osteoarthritis)   . Obesity   . PONV (postoperative nausea and vomiting)   . Post-polio syndrome    contracted at 12 months old  . Pulmonary nodule 11/05/2012   RML  . Shingles 2009    Family History: Family History  Problem Relation Age of Onset  . Heart disease Mother   . Heart disease Father   . Heart disease Brother   . Stroke Maternal Grandmother   . Heart disease Maternal Grandfather   . Heart attack Brother     Social History: Social History   Socioeconomic History  . Marital status: Married    Spouse name: Not on file  . Number of children: Not on file  . Years of education: Not on file  . Highest education level: Not on file  Occupational History  . Not on file  Social Needs  . Financial resource strain: Not on file  . Food insecurity:    Worry: Not on file    Inability: Not on file  . Transportation needs:    Medical: Not on file    Non-medical: Not on file  Tobacco Use  . Smoking status: Former Smoker    Last attempt to quit: 05/09/1968    Years since quitting: 49.1  . Smokeless tobacco: Never Used  Substance and Sexual Activity  . Alcohol use: No  . Drug use: No  . Sexual activity: Not on file  Lifestyle  . Physical activity:    Days per week: Not on file    Minutes per session: Not on file  . Stress: Not on file  Relationships  . Social  connections:    Talks on phone: Not on file    Gets together: Not on file    Attends religious service: Not on file    Active member of club or organization: Not on file    Attends meetings of clubs or organizations: Not on file    Relationship status: Not on file  . Intimate partner violence:    Fear of current or ex partner: Not on file    Emotionally abused: Not on file    Physically abused: Not on file    Forced sexual  activity: Not on file  Other Topics Concern  . Not on file  Social History Narrative  . Not on file    Vital Signs: Blood pressure (!) 144/71, pulse 62, resp. rate 16, height 5\' 3"  (1.6 m), weight 170 lb (77.1 kg), SpO2 98 %.  Examination: General Appearance: The patient is well-developed, well-nourished, and in no distress. Skin: Gross inspection of skin unremarkable. Head: normocephalic, no gross deformities. Eyes: no gross deformities noted. ENT: ears appear grossly normal no exudates. Neck: Supple. No thyromegaly. No LAD. Respiratory: no rhonchi noted. Cardiovascular: Normal S1 and S2 without murmur or rub. Extremities: No cyanosis. pulses are equal. Neurologic: Alert and oriented. No involuntary movements.  LABS: No results found for this or any previous visit (from the past 2160 hour(s)).  Radiology: US Venous Img Lower Unilateral Left  Result Date: 09/17/2016 CLINICAL DATA:  72 year old female with acute calf pain since last night. Recent hernia surgery. Varicose veins EXAM: LEFT LOWER EXTREMITY VENOUS DOPPLER ULTRASOUND TECHNIQUE: Gray-scale sonography with graded compression, as well as color Doppler and duplex ultrasound were performed to evaluate the lower extremity deep venous systems from the level of the common femoral vein and including the common femoral, femoral, profunda femoral, popliteal and calf veins including the posterior tibial, peroneal and gastrocnemius veins when visible. The superficial great saphenous vein was also interrogated.  Spectral Doppler was utilized to evaluate flow at rest and with distal augmentation maneuvers in the common femoral, femoral and popliteal veins. COMPARISON:  Left knee radiographs 03/12/2011 FINDINGS: Contralateral Common Femoral Vein: Respiratory phasicity is normal and symmetric with the symptomatic side. No evidence of thrombus. Normal compressibility. Common Femoral Vein: No evidence of thrombus. Normal compressibility, respiratory phasicity and response to augmentation. Saphenofemoral Junction: No evidence of thrombus. Normal compressibility and flow on color Doppler imaging. Profunda Femoral Vein: No evidence of thrombus. Normal compressibility and flow on color Doppler imaging. Femoral Vein: No evidence of thrombus. Normal compressibility, respiratory phasicity and response to augmentation. Popliteal Vein: No evidence of thrombus. Normal compressibility, respiratory phasicity and response to augmentation. Calf Veins: No evidence of thrombus. Normal compressibility and flow on color Doppler imaging. Venous Reflux:  None. Other Findings:  None. IMPRESSION: No evidence of left lower extremity deep venous thrombosis. Electronically Signed   By: Genevie Ann M.D.   On: 09/17/2016 14:24    No results found.  No results found.    Assessment and Plan: Patient Active Problem List   Diagnosis Date Noted  . Depression 03/23/2017  . Hiatal hernia 04/11/2016  . Anxiety   . OA (osteoarthritis)   . Hyperlipidemia   . Hypertension   . Post-polio syndrome   . Lung nodule   . Esophageal dysmotility   . Incontinence of urine   . Obesity   . GERD (gastroesophageal reflux disease) 11/05/2012  . Pulmonary nodule 11/05/2012  . Shingles 01/22/2007    1. OSA on CPAP therapy which will be continued hopefully the next compliance report will be better.  Continue current pressures at this time.  And as already mentioned follow-up on down blood for 2. ASthma will schedule for follow-up pulmonary function study to  be done.  Patient is overdue since her last study was done. 3. GERD stable controlled at this time   General Counseling: I have discussed the findings of the evaluation and examination with [facility].  I have also discussed any further diagnostic evaluation thatmay be needed or ordered today. Susan Shah verbalizes understanding of the findings of todays visit. We also  reviewed her medications today and discussed drug interactions and side effects including but not limited excessive drowsiness and altered mental states. We also discussed that there is always a risk not just to her but also people around her. she has been encouraged to call the office with any questions or concerns that should arise related to todays visit.    Time spent: 49min  I have personally obtained a history, examined the patient, evaluated laboratory and imaging results, formulated the assessment and plan and placed orders.    [name], MD [facility] Pulmonary and Critical Care Sleep medicine

## 2017-06-18 ENCOUNTER — Ambulatory Visit: Payer: Self-pay | Admitting: Internal Medicine

## 2017-06-23 ENCOUNTER — Other Ambulatory Visit: Payer: Self-pay | Admitting: Internal Medicine

## 2017-07-02 ENCOUNTER — Ambulatory Visit (INDEPENDENT_AMBULATORY_CARE_PROVIDER_SITE_OTHER): Payer: Medicare Other | Admitting: Internal Medicine

## 2017-07-02 DIAGNOSIS — J4520 Mild intermittent asthma, uncomplicated: Secondary | ICD-10-CM

## 2017-07-02 LAB — PULMONARY FUNCTION TEST

## 2017-07-08 ENCOUNTER — Other Ambulatory Visit: Payer: Self-pay | Admitting: Internal Medicine

## 2017-07-15 ENCOUNTER — Other Ambulatory Visit: Payer: Self-pay | Admitting: Neurology

## 2017-07-15 ENCOUNTER — Ambulatory Visit: Payer: Self-pay | Admitting: Nurse Practitioner

## 2017-07-15 DIAGNOSIS — R29898 Other symptoms and signs involving the musculoskeletal system: Secondary | ICD-10-CM | POA: Diagnosis not present

## 2017-07-15 DIAGNOSIS — G8311 Monoplegia of lower limb affecting right dominant side: Secondary | ICD-10-CM | POA: Insufficient documentation

## 2017-07-15 DIAGNOSIS — Z8679 Personal history of other diseases of the circulatory system: Secondary | ICD-10-CM

## 2017-07-15 DIAGNOSIS — E519 Thiamine deficiency, unspecified: Secondary | ICD-10-CM | POA: Diagnosis not present

## 2017-07-15 DIAGNOSIS — Z79899 Other long term (current) drug therapy: Secondary | ICD-10-CM | POA: Diagnosis not present

## 2017-07-15 DIAGNOSIS — M5412 Radiculopathy, cervical region: Secondary | ICD-10-CM | POA: Diagnosis not present

## 2017-07-15 DIAGNOSIS — E559 Vitamin D deficiency, unspecified: Secondary | ICD-10-CM | POA: Diagnosis not present

## 2017-07-15 DIAGNOSIS — E538 Deficiency of other specified B group vitamins: Secondary | ICD-10-CM | POA: Diagnosis not present

## 2017-07-15 DIAGNOSIS — R51 Headache: Secondary | ICD-10-CM | POA: Diagnosis not present

## 2017-07-17 ENCOUNTER — Other Ambulatory Visit: Payer: Self-pay

## 2017-07-17 MED ORDER — OXYBUTYNIN CHLORIDE ER 10 MG PO TB24: 20.0000 mg | ORAL_TABLET | Freq: Every day | ORAL | 3 refills | Status: DC

## 2017-07-22 ENCOUNTER — Ambulatory Visit
Admission: RE | Admit: 2017-07-22 | Discharge: 2017-07-22 | Disposition: A | Discharge status: Home / Self Care | Payer: Medicare Other | Source: Ambulatory Visit | Attending: Neurology | Admitting: Neurology

## 2017-07-22 DIAGNOSIS — R51 Headache: Secondary | ICD-10-CM | POA: Diagnosis not present

## 2017-07-22 DIAGNOSIS — Z8679 Personal history of other diseases of the circulatory system: Secondary | ICD-10-CM | POA: Diagnosis not present

## 2017-07-22 DIAGNOSIS — I672 Cerebral atherosclerosis: Secondary | ICD-10-CM | POA: Diagnosis not present

## 2017-07-22 MED ORDER — IOHEXOL 350 MG/ML SOLN
75.0000 mL | Freq: Once | INTRAVENOUS | Status: AC | PRN
  Administered 2017-07-22: 75 mL via INTRAVENOUS

## 2017-07-22 NOTE — Procedures (Signed)
[place], [address]  DATE OF SERVICE: July 02, 2017  Complete Pulmonary Function Testing Interpretation:  FINDINGS:   the forced vital capacity is normal.  The FEV1 is normal.  The FEV1 FVC ratio is normal.  Post bronchodilator there is no significant change in the FEV1.  Of the total lung capacity is normal as measured by plethysmography.  Residual volume is decreased residual volume total lung capacity ratio is decreased the FRC is decreased the DLCO is severely decreased.  IMPRESSION:    Spirometry and lung volumes are within normal limits.  DLCO is severely decreased.  Factors affecting the DLCO include ongoing tobacco use interstitial lung disease COPD and pulmonary circulatory disease.  Clinical correlation is recommended  [name], MD [facility] Pulmonary Critical Care Medicine Sleep Medicine

## 2017-07-23 ENCOUNTER — Ambulatory Visit: Payer: Self-pay

## 2017-07-26 DIAGNOSIS — L298 Other pruritus: Secondary | ICD-10-CM | POA: Diagnosis not present

## 2017-07-26 DIAGNOSIS — L309 Dermatitis, unspecified: Secondary | ICD-10-CM | POA: Diagnosis not present

## 2017-07-30 ENCOUNTER — Ambulatory Visit: Payer: Self-pay

## 2017-07-30 ENCOUNTER — Ambulatory Visit: Payer: Medicare Other

## 2017-07-30 DIAGNOSIS — E786 Lipoprotein deficiency: Secondary | ICD-10-CM | POA: Diagnosis not present

## 2017-07-30 DIAGNOSIS — R29898 Other symptoms and signs involving the musculoskeletal system: Secondary | ICD-10-CM | POA: Diagnosis not present

## 2017-07-30 DIAGNOSIS — G14 Postpolio syndrome: Secondary | ICD-10-CM | POA: Diagnosis not present

## 2017-07-30 DIAGNOSIS — M1990 Unspecified osteoarthritis, unspecified site: Secondary | ICD-10-CM | POA: Diagnosis not present

## 2017-07-30 DIAGNOSIS — K219 Gastro-esophageal reflux disease without esophagitis: Secondary | ICD-10-CM | POA: Diagnosis not present

## 2017-07-30 DIAGNOSIS — I10 Essential (primary) hypertension: Secondary | ICD-10-CM | POA: Diagnosis not present

## 2017-07-30 DIAGNOSIS — F329 Major depressive disorder, single episode, unspecified: Secondary | ICD-10-CM | POA: Diagnosis not present

## 2017-07-30 DIAGNOSIS — E785 Hyperlipidemia, unspecified: Secondary | ICD-10-CM | POA: Diagnosis not present

## 2017-07-30 DIAGNOSIS — E669 Obesity, unspecified: Secondary | ICD-10-CM | POA: Diagnosis not present

## 2017-08-01 ENCOUNTER — Other Ambulatory Visit: Payer: Self-pay | Admitting: Internal Medicine

## 2017-08-05 ENCOUNTER — Encounter: Payer: Self-pay | Admitting: Occupational Therapy

## 2017-08-05 ENCOUNTER — Ambulatory Visit: Payer: Medicare Other | Attending: Neurology | Admitting: Occupational Therapy

## 2017-08-05 ENCOUNTER — Ambulatory Visit
Admission: RE | Admit: 2017-08-05 | Discharge: 2017-08-05 | Disposition: A | Discharge status: Home / Self Care | Payer: Medicare Other | Source: Ambulatory Visit | Attending: Neurology | Admitting: Neurology

## 2017-08-05 ENCOUNTER — Other Ambulatory Visit: Payer: Self-pay

## 2017-08-05 DIAGNOSIS — R29898 Other symptoms and signs involving the musculoskeletal system: Secondary | ICD-10-CM | POA: Diagnosis present

## 2017-08-05 DIAGNOSIS — R208 Other disturbances of skin sensation: Secondary | ICD-10-CM | POA: Insufficient documentation

## 2017-08-05 DIAGNOSIS — M5412 Radiculopathy, cervical region: Secondary | ICD-10-CM | POA: Diagnosis present

## 2017-08-05 DIAGNOSIS — M542 Cervicalgia: Secondary | ICD-10-CM | POA: Diagnosis not present

## 2017-08-05 DIAGNOSIS — M4802 Spinal stenosis, cervical region: Secondary | ICD-10-CM | POA: Insufficient documentation

## 2017-08-05 DIAGNOSIS — M6281 Muscle weakness (generalized): Secondary | ICD-10-CM | POA: Diagnosis not present

## 2017-08-05 NOTE — Patient Instructions (Signed)
Modify padding on crutches  Cubital tunnel elbow sleeve for when sitting and when sleeping  Wrist splint for night time   Contrast to be done 2 x day  And Med and Ulnar N glides - 5 reps - 2 x day

## 2017-08-05 NOTE — Therapy (Signed)
[place] [facility] [address], [place], [address] Phone: [phone]   Fax:  [phone]  Occupational Therapy Evaluation  Patient Details  Name: Susan Shah MRN: [id number] Date of Birth: 10/30/1945 Referring Provider: Manuella Ghazi   Encounter Date: 08/05/2017  OT End of Session - 08/05/17 1047    Visit Number  1    Number of Visits  4    Date for OT Re-Evaluation  09/02/17    OT Start Time  0952    OT Stop Time  1042    OT Time Calculation (min)  50 min    Activity Tolerance  Patient tolerated treatment well    Behavior During Therapy  [facility] for tasks assessed/performed       Past Medical History:  Diagnosis Date  . Anxiety   . Asthma 2015   mild, seasonal allergy triggered.  . Cancer ([place]) 2013   skin cancer  on left hand  . Depression   . Esophageal dysmotility   . GERD (gastroesophageal reflux disease) 11/05/2012  . History of hiatal hernia   . Hyperlipidemia   . Hypertension   . Incontinence of urine   . Lung nodule   . OA (osteoarthritis)   . Obesity   . PONV (postoperative nausea and vomiting)   . Post-polio syndrome    contracted at 69 months old  . Pulmonary nodule 11/05/2012   RML  . Shingles 2009    Past Surgical History:  Procedure Laterality Date  . [place] STUDY N/A 04/24/2016   Procedure: 46 HOUR PH STUDY;  Surgeon: Lucilla Lame, MD;  Location: [facility] ENDOSCOPY;  Service: Endoscopy;  Laterality: N/A;  . ABDOMINAL HYSTERECTOMY     Total  . APPENDECTOMY    . CARPOMETACARPAL ([place]) FUSION OF THUMB Left 05/16/2016   Procedure: CARPOMETACARPAL [facility]) FUSION OF THUMB;  Surgeon: Hessie Knows, MD;  Location: [facility] ORS;  Service: Orthopedics;  Laterality: Left;  . CATARACT EXTRACTION Bilateral 12/2012  . CHOLECYSTECTOMY    . COLONOSCOPY  2003  . ESOPHAGEAL MANOMETRY N/A 04/24/2016   Procedure: ESOPHAGEAL MANOMETRY (EM);  Surgeon: Lucilla Lame, MD;  Location: [facility] ENDOSCOPY;  Service: Endoscopy;   Laterality: N/A;  . ESOPHAGOGASTRODUODENOSCOPY  08/2011  . FOOT SURGERY    . HALLUX VALGUS CORRECTION    . HIP SURGERY Right 1950   tendon release r/t polio  . KNEE ARTHROSCOPY Left 06/23/2008  . RETINAL DETACHMENT SURGERY Right 03/2014  . ROTATOR CUFF REPAIR Bilateral   . URINARY SURGERY  2014   [place]    There were no vitals filed for this visit.  Subjective Assessment - 08/05/17 1048    Subjective   I had polio for years- and using my crutches for about the last 15-20 yrs- my arms got gradually weaker the last year or 2 - dropping things- or open jars - realing on my hands more - numbness in the morning in my hands - R worse and in the pinkie -     Patient Stated Goals  I want to keep my arm and hand strength - need it because my L leg is getting worse and depend more on my crutches    Currently in Pain?  No/denies        [facility] OT Assessment - 08/05/17 0001      Assessment   Medical Diagnosis  Bilateral UE weaknes     Referring Provider  Manuella Ghazi    Onset Date/Surgical Date  --  gradually     Hand Dominance  Right    Next MD Visit  -- 6 months     Prior Therapy  -- L thumb [facility] arthroplasty 5/18      Precautions   Required Braces or Orthoses  -- Polio as child- braces on R leg      Home  Environment   Lives With  Spouse      Prior Function   Vocation  Retired    Leisure  R hand dominant - cook, Haematologist , read,       Consulting civil engineer   Overall Strength Comments  Bilateral UE AROM and strength WNL - 5/5 shoulders thru wrist  in all planes     Right Hand Grip (lbs)  46    Right Hand Lateral Pinch  12 lbs    Right Hand 3 Point Pinch  15 lbs    Left Hand Grip (lbs)  49    Left Hand Lateral Pinch  10 lbs    Left Hand 3 Point Pinch  10 lbs           Modify padding on crutches  Cubital tunnel elbow sleeve made for pt - to wear  when sitting and when sleeping  Wrist splint for night time- fitted for R hand - prefab pt ed on wearing    Contrast to be done 2 x day  And  Med and Ulnar N glides - 5 reps - 2 x day  - ed pt on performing             OT Education - 08/05/17 1047    Education Details  findings , modifications     Person(s) Educated  Patient    Methods  Explanation;Demonstration;Tactile cues    Comprehension  Returned demonstration          OT Long Term Goals - 08/05/17 1148      OT LONG TERM GOAL #1   Title  Pt to be independent in homeprogram and modifications to report decrease symptoms in pins and needles/numbness in am     Baseline  see eval for findings     Time  4    Period  Weeks    Status  New    Target Date  09/02/17            Plan - 08/05/17 1140    Clinical Impression Statement  Pt present this date to OT eval with referral for increase bilateral UE weakness- upon evaluation - pt AROM and strength in bilateral UE shoulder to wrist is WNL and 5/5 - and her grip is above her age on L and midrange on R  - prehension decrease on L but pt had last year [facility] arthroplast - pt report some numbness or pins and needles in am -and  R hand worse than L - and 5th digit - appear pt do sleep in fetal position with arms , and lean on her R elbow a lot - seen in clinic - and then pushing thru her crutches over CT and [place] - tender over CT and [place] - R worse than L -and Tender over Cubital tunnel on R - but negative Tinel - discuss with pt posiitioning of arms when sleeping , wearing sleeve on R elbow and wrist splint - and no lean on elbows - also applied extra padding on crutches - - she is getting cervical MRI later today - Never conduction did show mod CTS bilateral -  pt to do HEP and modifications for week and follow up next week with me     Occupational performance deficits (Please refer to evaluation for details):  ADL's;IADL's    Rehab Potential  Fair    Current Impairments/barriers affecting progress:  polio- long term crutch use more than 15-20 yrs - pressure thru CT and [place]     OT Frequency  1x /  week    OT Duration  4 weeks    OT Treatment/Interventions  Self-care/ADL training;Patient/family education;Splinting;Ultrasound;Contrast Bath;Manual Therapy    Plan  reassess progress with homeprogram after week     Clinical Decision Making  Limited treatment options, no task modification necessary    OT Home Exercise Plan  see pt instruction    Consulted and Agree with Plan of Care  Patient       Patient will benefit from skilled therapeutic intervention in order to improve the following deficits and impairments:  Decreased strength, Impaired sensation, Impaired UE functional use  Visit Diagnosis: Other disturbances of skin sensation - Plan: Ot plan of care cert/re-cert    Problem List Patient Active Problem List   Diagnosis Date Noted  . Depression 03/23/2017  . Hiatal hernia 04/11/2016  . Anxiety   . OA (osteoarthritis)   . Hyperlipidemia   . Hypertension   . Post-polio syndrome   . Lung nodule   . Esophageal dysmotility   . Incontinence of urine   . Obesity   . GERD (gastroesophageal reflux disease) 11/05/2012  . Pulmonary nodule 11/05/2012  . Shingles 01/22/2007    Rosalyn Gess OTR/l,CLT 08/05/2017, 11:53 AM  [place] [facility] [address], [place], [address] Phone: [phone]   Fax:  [phone]  Name: DALYAH PLA MRN: [id number] Date of Birth: 09-May-1945

## 2017-08-06 ENCOUNTER — Ambulatory Visit: Payer: Self-pay

## 2017-08-12 ENCOUNTER — Ambulatory Visit: Payer: Medicare Other | Admitting: Occupational Therapy

## 2017-08-12 DIAGNOSIS — R208 Other disturbances of skin sensation: Secondary | ICD-10-CM | POA: Diagnosis not present

## 2017-08-12 DIAGNOSIS — M6281 Muscle weakness (generalized): Secondary | ICD-10-CM

## 2017-08-12 NOTE — Patient Instructions (Signed)
Pt to cont with same HEP for 3 wks

## 2017-08-12 NOTE — Therapy (Signed)
[facility] [address] [place], [address] Phone: [phone]   Fax:  [phone]  Occupational Therapy Treatment  Patient Details  Name: Susan Shah MRN: [id number] Date of Birth: June 27, 1945 Referring Provider: Manuella Ghazi   Encounter Date: 08/12/2017  OT End of Session - 08/12/17 1115    Visit Number  2    Number of Visits  4    Date for OT Re-Evaluation  09/02/17    OT Start Time  1038    OT Stop Time  1102    OT Time Calculation (min)  24 min    Activity Tolerance  Patient tolerated treatment well    Behavior During Therapy  [facility] for tasks assessed/performed       Past Medical History:  Diagnosis Date  . Anxiety   . Asthma 2015   mild, seasonal allergy triggered.  . Cancer ([place]) 2013   skin cancer  on left hand  . Depression   . Esophageal dysmotility   . GERD (gastroesophageal reflux disease) 11/05/2012  . History of hiatal hernia   . Hyperlipidemia   . Hypertension   . Incontinence of urine   . Lung nodule   . OA (osteoarthritis)   . Obesity   . PONV (postoperative nausea and vomiting)   . Post-polio syndrome    contracted at 58 months old  . Pulmonary nodule 11/05/2012   RML  . Shingles 2009    Past Surgical History:  Procedure Laterality Date  . [place] STUDY N/A 04/24/2016   Procedure: 6 HOUR PH STUDY;  Surgeon: Lucilla Lame, MD;  Location: [facility] ENDOSCOPY;  Service: Endoscopy;  Laterality: N/A;  . ABDOMINAL HYSTERECTOMY     Total  . APPENDECTOMY    . CARPOMETACARPAL ([place]) FUSION OF THUMB Left 05/16/2016   Procedure: CARPOMETACARPAL [facility]) FUSION OF THUMB;  Surgeon: Hessie Knows, MD;  Location: [facility] ORS;  Service: Orthopedics;  Laterality: Left;  . CATARACT EXTRACTION Bilateral 12/2012  . CHOLECYSTECTOMY    . COLONOSCOPY  2003  . ESOPHAGEAL MANOMETRY N/A 04/24/2016   Procedure: ESOPHAGEAL MANOMETRY (EM);  Surgeon: Lucilla Lame, MD;  Location: [facility] ENDOSCOPY;  Service: Endoscopy;   Laterality: N/A;  . ESOPHAGOGASTRODUODENOSCOPY  08/2011  . FOOT SURGERY    . HALLUX VALGUS CORRECTION    . HIP SURGERY Right 1950   tendon release r/t polio  . KNEE ARTHROSCOPY Left 06/23/2008  . RETINAL DETACHMENT SURGERY Right 03/2014  . ROTATOR CUFF REPAIR Bilateral   . URINARY SURGERY  2014   [place]    There were no vitals filed for this visit.  Subjective Assessment - 08/12/17 1113    Subjective   Doing better- my numbness in the R hand and L better in my pinkie - more strength - and tenderness in hands better- the xtra padding on crutches helps, wrist splint at night time -and the elbow pad you made for my R elbow - sleeping with it and then trying not to lean on my elbow when sitting - all is helping - did not hear results from MRI yet     Patient Stated Goals  I want to keep my arm and hand strength - need it because my L leg is getting worse and depend more on my crutches    Currently in Pain?  No/denies         [facility] OT Assessment - 08/12/17 0001      Strength   Right Hand Grip (  lbs)  59    Right Hand Lateral Pinch  13 lbs    Right Hand 3 Point Pinch  16 lbs    Left Hand Grip (lbs)  52    Left Hand Lateral Pinch  10 lbs    Left Hand 3 Point Pinch  12 lbs      Pt still positive Tinel on R cubital tunnel -but less per pt and less tenderness  cont with elbow pads  Tenderness over CT and Guyons canal improve - still little bit  Grip and prehension - improve on R more than L  Great progress    cont with xtra padding on crutches  - weight more thru palm now and not guyons canal and CT  Cubital tunnel elbow sleeve to cont with on R elbow  - to wear  when sitting and when sleeping  Wrist splint for night time-- prefab pt ed on wearing  - night time R hand   Contrast to be done 2 x day still  And Med and Ulnar N glides - 5 reps - 2 x day  - ed pt on performing  Review with pt - needed min A -                OT Education - 08/12/17 1114     Education Details  Review HEP again for modifications, nerve glides and wearing of splints     Person(s) Educated  Patient    Methods  Explanation;Demonstration    Comprehension  Verbalized understanding;Returned demonstration          OT Long Term Goals - 08/05/17 1148      OT LONG TERM GOAL #1   Title  Pt to be independent in homeprogram and modifications to report decrease symptoms in pins and needles/numbness in am     Baseline  see eval for findings     Time  4    Period  Weeks    Status  New    Target Date  09/02/17            Plan - 08/12/17 1115    Clinical Impression Statement  Pt made great progress this past week in numbness, weakness and tenderness  in R hand /elbow more than L hand  -pt crutches handles was padded to protect Guyons canal, pt wearing R wrist splint for CT  and elbow pad for ulnar N compression  - and then cont with nerve glides- pt to cont with same HEP for 3 wks and will return for reassessment     Occupational performance deficits (Please refer to evaluation for details):  ADL's;IADL's    Rehab Potential  Fair    Current Impairments/barriers affecting progress:  polio- long term crutch use more than 15-20 yrs - pressure thru CT and Guyons canal     OT Frequency  Biweekly    OT Duration  4 weeks    OT Treatment/Interventions  Self-care/ADL training;Patient/family education;Splinting;Ultrasound;Contrast Bath;Manual Therapy    Plan  reassess progress with homeprogram after 3  weeks    Clinical Decision Making  Limited treatment options, no task modification necessary    OT Home Exercise Plan  see pt instruction    Consulted and Agree with Plan of Care  Patient       Patient will benefit from skilled therapeutic intervention in order to improve the following deficits and impairments:  Decreased strength, Impaired sensation, Impaired UE functional use  Visit Diagnosis: Other disturbances of skin sensation  Muscle weakness  (generalized)    Problem List Patient Active Problem List   Diagnosis Date Noted  . Depression 03/23/2017  . Hiatal hernia 04/11/2016  . Anxiety   . OA (osteoarthritis)   . Hyperlipidemia   . Hypertension   . Post-polio syndrome   . Lung nodule   . Esophageal dysmotility   . Incontinence of urine   . Obesity   . GERD (gastroesophageal reflux disease) 11/05/2012  . Pulmonary nodule 11/05/2012  . Shingles 01/22/2007    Rosalyn Gess OTR/L,CLT 08/12/2017, 11:18 AM  [facility] [address], [place], [address] Phone: [phone]   Fax:  [phone]  Name: Susan Shah MRN: [id number] Date of Birth: Jun 06, 1945

## 2017-08-13 ENCOUNTER — Ambulatory Visit (INDEPENDENT_AMBULATORY_CARE_PROVIDER_SITE_OTHER): Payer: Medicare Other

## 2017-08-13 DIAGNOSIS — G4733 Obstructive sleep apnea (adult) (pediatric): Secondary | ICD-10-CM

## 2017-08-13 NOTE — Progress Notes (Signed)
Pt wants cpap changed to 10 cwp on auto and states she cannot use I looked back on last readings and changed to 10 cwp she will come back for follow up of that pressure

## 2017-08-17 DIAGNOSIS — R300 Dysuria: Secondary | ICD-10-CM | POA: Diagnosis not present

## 2017-08-24 ENCOUNTER — Observation Stay
Admission: EM | Admit: 2017-08-24 | Discharge: 2017-08-25 | Disposition: A | Discharge status: Home / Self Care | Payer: Medicare Other | Attending: Internal Medicine | Admitting: Internal Medicine

## 2017-08-24 ENCOUNTER — Encounter: Payer: Self-pay | Admitting: Emergency Medicine

## 2017-08-24 ENCOUNTER — Emergency Department: Payer: Medicare Other

## 2017-08-24 ENCOUNTER — Other Ambulatory Visit: Payer: Self-pay

## 2017-08-24 DIAGNOSIS — R079 Chest pain, unspecified: Secondary | ICD-10-CM | POA: Diagnosis present

## 2017-08-24 DIAGNOSIS — J45909 Unspecified asthma, uncomplicated: Secondary | ICD-10-CM | POA: Insufficient documentation

## 2017-08-24 DIAGNOSIS — K224 Dyskinesia of esophagus: Secondary | ICD-10-CM | POA: Diagnosis not present

## 2017-08-24 DIAGNOSIS — D649 Anemia, unspecified: Secondary | ICD-10-CM | POA: Diagnosis not present

## 2017-08-24 DIAGNOSIS — M1990 Unspecified osteoarthritis, unspecified site: Secondary | ICD-10-CM | POA: Insufficient documentation

## 2017-08-24 DIAGNOSIS — E785 Hyperlipidemia, unspecified: Secondary | ICD-10-CM | POA: Diagnosis not present

## 2017-08-24 DIAGNOSIS — Z79899 Other long term (current) drug therapy: Secondary | ICD-10-CM | POA: Insufficient documentation

## 2017-08-24 DIAGNOSIS — E860 Dehydration: Secondary | ICD-10-CM | POA: Insufficient documentation

## 2017-08-24 DIAGNOSIS — F419 Anxiety disorder, unspecified: Secondary | ICD-10-CM | POA: Diagnosis not present

## 2017-08-24 DIAGNOSIS — I10 Essential (primary) hypertension: Secondary | ICD-10-CM | POA: Diagnosis not present

## 2017-08-24 DIAGNOSIS — E876 Hypokalemia: Secondary | ICD-10-CM | POA: Insufficient documentation

## 2017-08-24 DIAGNOSIS — R0789 Other chest pain: Secondary | ICD-10-CM | POA: Diagnosis not present

## 2017-08-24 DIAGNOSIS — Z85828 Personal history of other malignant neoplasm of skin: Secondary | ICD-10-CM | POA: Insufficient documentation

## 2017-08-24 DIAGNOSIS — K219 Gastro-esophageal reflux disease without esophagitis: Secondary | ICD-10-CM | POA: Diagnosis not present

## 2017-08-24 DIAGNOSIS — Z7982 Long term (current) use of aspirin: Secondary | ICD-10-CM | POA: Diagnosis not present

## 2017-08-24 DIAGNOSIS — I200 Unstable angina: Secondary | ICD-10-CM

## 2017-08-24 DIAGNOSIS — R0602 Shortness of breath: Secondary | ICD-10-CM | POA: Diagnosis not present

## 2017-08-24 DIAGNOSIS — F329 Major depressive disorder, single episode, unspecified: Secondary | ICD-10-CM | POA: Insufficient documentation

## 2017-08-24 DIAGNOSIS — Z87891 Personal history of nicotine dependence: Secondary | ICD-10-CM | POA: Diagnosis not present

## 2017-08-24 LAB — CBC WITH DIFFERENTIAL/PLATELET
Basophils Absolute: 0 10*3/uL (ref 0–0.1)
Basophils Relative: 1 %
Eosinophils Absolute: 0.2 10*3/uL (ref 0–0.7)
Eosinophils Relative: 2 %
HCT: 33.8 % — ABNORMAL LOW (ref 35.0–47.0)
Hemoglobin: 11.4 g/dL — ABNORMAL LOW (ref 12.0–16.0)
Lymphocytes Relative: 30 %
Lymphs Abs: 2.7 10*3/uL (ref 1.0–3.6)
MCH: 27.8 pg (ref 26.0–34.0)
MCHC: 33.8 g/dL (ref 32.0–36.0)
MCV: 82.2 fL (ref 80.0–100.0)
Monocytes Absolute: 0.9 10*3/uL (ref 0.2–0.9)
Monocytes Relative: 10 %
Neutro Abs: 5.3 10*3/uL (ref 1.4–6.5)
Neutrophils Relative %: 57 %
Platelets: 352 10*3/uL (ref 150–440)
RBC: 4.11 MIL/uL (ref 3.80–5.20)
RDW: 14 % (ref 11.5–14.5)
WBC: 9.1 10*3/uL (ref 3.6–11.0)

## 2017-08-24 NOTE — ED Triage Notes (Signed)
EMS pt to rm 26 from home with report of chest pain that stared earlier in the day in her epigastric region. Now has moved in to both her arms. + nausea, +shob, +diaphoresis with the pain.

## 2017-08-24 NOTE — ED Provider Notes (Signed)
[facility] Emergency Department Provider Note  ____________________________________________   First MD Initiated Contact with Patient 08/24/17 2309     (approximate)  I have reviewed the triage vital signs and the nursing notes.   HISTORY  Chief Complaint Chest Pain   HPI Susan Shah is a 72 y.o. female who comes to the emergency department via EMS with substernal pressure-like chest pain radiating to right and left shoulders into her jaw that began several hours ago.  Associated with shortness of breath and nausea.  She has no cardiac history although she does have a history of hypertension and dyslipidemia.  She took 324 mg of aspirin prior to EMS arrival.  EMS gave her 1 dose of nitroglycerin which did improve her symptoms.  Some nausea but no vomiting.  Pain is nonexertional.  It was not ripping or tearing and did not go straight to her back.  She does have a history of a hiatal hernia and gastric reflux.  Nothing seems to make the symptoms better or worse.  There are moderate severity.    Past Medical History:  Diagnosis Date  . Anxiety   . Asthma 2015   mild, seasonal allergy triggered.  . Cancer ([place]) 2013   skin cancer  on left hand  . Depression   . Esophageal dysmotility   . GERD (gastroesophageal reflux disease) 11/05/2012  . History of hiatal hernia   . Hyperlipidemia   . Hypertension   . Incontinence of urine   . Lung nodule   . OA (osteoarthritis)   . Obesity   . PONV (postoperative nausea and vomiting)   . Post-polio syndrome    contracted at 40 months old  . Pulmonary nodule 11/05/2012   RML  . Shingles 2009    Patient Active Problem List   Diagnosis Date Noted  . Chest pain 08/25/2017  . Depression 03/23/2017  . Hiatal hernia 04/11/2016  . Anxiety   . OA (osteoarthritis)   . Hyperlipidemia   . Hypertension   . Post-polio syndrome   . Lung nodule   . Esophageal dysmotility   . Incontinence of urine   . Obesity   .  GERD (gastroesophageal reflux disease) 11/05/2012  . Pulmonary nodule 11/05/2012  . Shingles 01/22/2007    Past Surgical History:  Procedure Laterality Date  . [place] STUDY N/A 04/24/2016   Procedure: 60 HOUR PH STUDY;  Surgeon: Lucilla Lame, MD;  Location: [facility] ENDOSCOPY;  Service: Endoscopy;  Laterality: N/A;  . ABDOMINAL HYSTERECTOMY     Total  . APPENDECTOMY    . CARPOMETACARPAL ([place]) FUSION OF THUMB Left 05/16/2016   Procedure: CARPOMETACARPAL [facility]) FUSION OF THUMB;  Surgeon: Hessie Knows, MD;  Location: [facility] ORS;  Service: Orthopedics;  Laterality: Left;  . CATARACT EXTRACTION Bilateral 12/2012  . CHOLECYSTECTOMY    . COLONOSCOPY  2003  . ESOPHAGEAL MANOMETRY N/A 04/24/2016   Procedure: ESOPHAGEAL MANOMETRY (EM);  Surgeon: Lucilla Lame, MD;  Location: [facility] ENDOSCOPY;  Service: Endoscopy;  Laterality: N/A;  . ESOPHAGOGASTRODUODENOSCOPY  08/2011  . FOOT SURGERY    . HALLUX VALGUS CORRECTION    . HIP SURGERY Right 1950   tendon release r/t polio  . KNEE ARTHROSCOPY Left 06/23/2008  . RETINAL DETACHMENT SURGERY Right 03/2014  . ROTATOR CUFF REPAIR Bilateral   . URINARY SURGERY  2014   [place]    Prior to Admission medications   Medication Sig Start Date End Date Taking? Authorizing Provider  aspirin EC 81 MG  tablet Take 81 mg by mouth at bedtime.    Yes [provider]  buPROPion (WELLBUTRIN XL) 150 MG 24 hr tablet Take 1 tablet (150 mg total) by mouth every morning. 03/07/17  Yes Boscia, Greer Ee, NP  cetirizine (ZYRTEC) 10 MG tablet Take 10 mg by mouth daily.   Yes [provider]  Cholecalciferol (VITAMIN D) 2000 units tablet Take 2,000 Units by mouth daily.   Yes [provider]  cyanocobalamin 500 MCG tablet Take 500 mcg by mouth daily.   Yes [provider]  diclofenac (VOLTAREN) 50 MG EC tablet Take 50 mg by mouth 2 (two) times daily as needed for mild pain.   Yes [provider]  escitalopram (LEXAPRO) 10 MG tablet TAKE ONE  TABLET BY MOUTH EVERY DAY AT 5-7 PM 07/08/17  Yes Boscia, Heather E, NP  losartan-hydrochlorothiazide (HYZAAR) 100-25 MG tablet Take 1 tablet by mouth daily. In am.   Yes [provider]  meclizine (ANTIVERT) 25 MG tablet Take 25 mg by mouth 3 (three) times daily.   Yes [provider]  metoCLOPramide (REGLAN) 5 MG tablet TAKE ONE TABLET BY MOUTH TWICE DAILY BEFORE MEALS AND ONE AT BEDTIME 08/01/17  Yes Lavera Guise, MD  metoprolol tartrate (LOPRESSOR) 50 MG tablet Take 1 tablet (50 mg total) by mouth 2 (two) times daily with a meal. 03/05/17  Yes Boscia, Heather E, NP  oxybutynin (DITROPAN-XL) 10 MG 24 hr tablet Take 2 tablets (20 mg total) by mouth daily. 07/17/17  Yes Boscia, Heather E, NP  pantoprazole (PROTONIX) 40 MG tablet TAKE ONE TABLET BY MOUTH TWICE DAILY 03/04/17  Yes Boscia, Heather E, NP  potassium chloride SA (K-DUR,KLOR-CON) 20 MEQ tablet TAKE ONE TABLET BY MOUTH EVERY DAY 06/23/17  Yes Boscia, Heather E, NP  simvastatin (ZOCOR) 20 MG tablet Take 1 tablet (20 mg total) by mouth daily at 6 PM. 02/06/17  Yes Boscia, Heather E, NP  traZODone (DESYREL) 50 MG tablet Take 50 mg by mouth at bedtime. 1-2 tablets as needed for sleep.   Yes [provider]  loratadine (CLARITIN) 10 MG tablet Take 10 mg by mouth daily as needed for allergies.    [provider]  ranitidine (ZANTAC) 150 MG tablet Take 150 mg by mouth daily as needed for heartburn.     [provider]    Allergies Soma [carisoprodol] and Iodinated diagnostic agents  Family History  Problem Relation Age of Onset  . Heart disease Mother   . Heart disease Father   . Heart disease Brother   . Stroke Maternal Grandmother   . Heart disease Maternal Grandfather   . Heart attack Brother     Social History Social History   Tobacco Use  . Smoking status: Former Smoker    Last attempt to quit: 05/09/1968    Years since quitting: 49.3  . Smokeless tobacco: Never Used  Substance Use  Topics  . Alcohol use: No  . Drug use: No    Review of Systems Constitutional: No fever/chills Eyes: No visual changes. ENT: No sore throat. Cardiovascular: Positive for chest pain. Respiratory: Positive for shortness of breath. Gastrointestinal: No abdominal pain.  Positive for nausea, no vomiting.  No diarrhea.  No constipation. Genitourinary: Negative for dysuria. Musculoskeletal: Negative for back pain. Skin: Negative for rash. Neurological: Negative for headaches, focal weakness or numbness.   ____________________________________________   PHYSICAL EXAM:  VITAL SIGNS: ED Triage Vitals  Enc Vitals Group     BP  Pulse      Resp      Temp      Temp src      SpO2      Weight      Height      Head Circumference      Peak Flow      Pain Score      Pain Loc      Pain Edu?      Excl. in [place]?     Constitutional: Alert and oriented x4 somewhat anxious appearing nontoxic no diaphoresis Eyes: PERRL EOMI. Head: Atraumatic. Nose: No congestion/rhinnorhea. Mouth/Throat: No trismus Neck: No stridor.   Cardiovascular: Normal rate, regular rhythm. Grossly normal heart sounds.  Good peripheral circulation. Respiratory: Normal respiratory effort.  No retractions. Lungs CTAB and moving good air Gastrointestinal: Soft nontender Musculoskeletal: No lower extremity edema   Neurologic:  Normal speech and language. No gross focal neurologic deficits are appreciated. Skin:  Skin is warm, dry and intact. No rash noted. Psychiatric: Mood and affect are normal. Speech and behavior are normal.    ____________________________________________   DIFFERENTIAL includes but not limited to  Acute coronary syndrome, gastritis, gastric reflux, pulmonary embolism, pancreatitis ____________________________________________   LABS (all labs ordered are listed, but only abnormal results are displayed)  Labs Reviewed  COMPREHENSIVE METABOLIC PANEL - Abnormal; Notable for the following  components:      Result Value   Potassium 3.1 (*)    Glucose, Bld 105 (*)    Calcium 8.7 (*)    Total Protein 6.4 (*)    Albumin 3.4 (*)    All other components within normal limits  CBC WITH DIFFERENTIAL/PLATELET - Abnormal; Notable for the following components:   Hemoglobin 11.4 (*)    HCT 33.8 (*)    All other components within normal limits  BRAIN NATRIURETIC PEPTIDE  TROPONIN I  LIPASE, BLOOD  TROPONIN I  TROPONIN I  MAGNESIUM  CALCIUM, IONIZED  PREALBUMIN    Lab work reviewed by me with no signs of acute ischemia x1 __________________________________________  EKG  ED ECG REPORT I, Darel Hong, the attending physician, personally viewed and interpreted this ECG.  Date: 08/25/2017 EKG Time:  Rate: 55 Rhythm: Sinus bradycardia QRS Axis: normal Intervals: normal ST/T Wave abnormalities: T wave inversion V2 V3 and biphasic T wave in V4 which is consistent with old EKG Narrative Interpretation: no evidence of acute ischemia  ____________________________________________  RADIOLOGY  Chest x-ray reviewed by me with no acute disease ____________________________________________   PROCEDURES  Procedure(s) performed: no  Procedures  Critical Care performed: no  ____________________________________________   INITIAL IMPRESSION / ASSESSMENT AND PLAN / ED COURSE  Pertinent labs & imaging results that were available during my care of the patient were reviewed by me and considered in my medical decision making (see chart for details).   As part of my medical decision making, I reviewed the following data within the [place] History obtained from family if available, nursing notes, old chart and ekg, as well as notes from prior ED visits.  Patient is 50 with hypertension and dyslipidemia and pressure-like chest pain radiating to her jaw and bilateral shoulders associated with nausea.  She has a high pretest probability for acute coronary  syndrome.  Already given aspirin at home.  As nitroglycerin did work will put on a nitroglycerin patch now.  EKG is abnormal but unchanged from previous.  At this point I do believe the patient requires inpatient admission for full cardiac  rule out and risk stratification.  Defer heparin at this time.      ____________________________________________   FINAL CLINICAL IMPRESSION(S) / ED DIAGNOSES  Final diagnoses:  Unstable angina pectoris ([place])      NEW MEDICATIONS STARTED DURING THIS VISIT:  New Prescriptions   No medications on file     Note:  This document was prepared using Dragon voice recognition software and may include unintentional dictation errors.     Darel Hong, MD 08/25/17 [phone]

## 2017-08-25 ENCOUNTER — Encounter: Payer: Self-pay | Admitting: Internal Medicine

## 2017-08-25 DIAGNOSIS — R0789 Other chest pain: Secondary | ICD-10-CM | POA: Diagnosis not present

## 2017-08-25 DIAGNOSIS — R079 Chest pain, unspecified: Secondary | ICD-10-CM | POA: Diagnosis not present

## 2017-08-25 DIAGNOSIS — E669 Obesity, unspecified: Secondary | ICD-10-CM | POA: Diagnosis not present

## 2017-08-25 DIAGNOSIS — K219 Gastro-esophageal reflux disease without esophagitis: Secondary | ICD-10-CM | POA: Diagnosis not present

## 2017-08-25 DIAGNOSIS — I959 Hypotension, unspecified: Secondary | ICD-10-CM | POA: Diagnosis not present

## 2017-08-25 LAB — COMPREHENSIVE METABOLIC PANEL
ALT: 11 U/L (ref 0–44)
AST: 18 U/L (ref 15–41)
Albumin: 3.4 g/dL — ABNORMAL LOW (ref 3.5–5.0)
Alkaline Phosphatase: 87 U/L (ref 38–126)
Anion gap: 7 (ref 5–15)
BUN: 16 mg/dL (ref 8–23)
CO2: 28 mmol/L (ref 22–32)
Calcium: 8.7 mg/dL — ABNORMAL LOW (ref 8.9–10.3)
Chloride: 102 mmol/L (ref 98–111)
Creatinine, Ser: 0.66 mg/dL (ref 0.44–1.00)
GFR calc Af Amer: >60 mL/min (ref >60)
GFR calc non Af Amer: >60 mL/min (ref >60)
Glucose, Bld: 105 mg/dL — ABNORMAL HIGH (ref 70–99)
Potassium: 3.1 mmol/L — ABNORMAL LOW (ref 3.5–5.1)
Sodium: 137 mmol/L (ref 135–145)
Total Bilirubin: 0.3 mg/dL (ref 0.3–1.2)
Total Protein: 6.4 g/dL — ABNORMAL LOW (ref 6.5–8.1)

## 2017-08-25 LAB — NM MYOCAR MULTI W/SPECT W/WALL MOTION / EF
Estimated workload: 1 METS
Exercise duration (min): 1 min
Exercise duration (sec): 1 s
LV dias vol: 38 mL (ref 46–106)
LV sys vol: 10 mL
MPHR: 149 {beats}/min
Peak HR: 109 {beats}/min
Percent HR: 73 %
Rest HR: 84 {beats}/min
SDS: 0
SRS: 0
SSS: 0
TID: 0.9

## 2017-08-25 LAB — TROPONIN I
Troponin I: <0.03 ng/mL (ref <0.03)
Troponin I: <0.03 ng/mL (ref <0.03)
Troponin I: <0.03 ng/mL (ref <0.03)

## 2017-08-25 LAB — MAGNESIUM: Magnesium: 1.9 mg/dL (ref 1.7–2.4)

## 2017-08-25 LAB — LIPID PANEL
Cholesterol: 126 mg/dL (ref 0–200)
HDL: 50 mg/dL (ref >40)
LDL Cholesterol: 56 mg/dL (ref 0–99)
Total CHOL/HDL Ratio: 2.5 RATIO
Triglycerides: 98 mg/dL (ref <150)
VLDL: 20 mg/dL (ref 0–40)

## 2017-08-25 LAB — BRAIN NATRIURETIC PEPTIDE: B Natriuretic Peptide: 65 pg/mL (ref 0.0–100.0)

## 2017-08-25 LAB — PREALBUMIN: Prealbumin: 18.3 mg/dL (ref 18–38)

## 2017-08-25 LAB — LIPASE, BLOOD: Lipase: 22 U/L (ref 11–51)

## 2017-08-25 MED ORDER — TRAZODONE HCL 50 MG PO TABS: 50.0000 mg | ORAL_TABLET | Freq: Every evening | ORAL | Status: DC | PRN

## 2017-08-25 MED ORDER — METOPROLOL TARTRATE 50 MG PO TABS: 50.0000 mg | ORAL_TABLET | Freq: Two times a day (BID) | ORAL | Status: DC

## 2017-08-25 MED ORDER — GI COCKTAIL ~~LOC~~
30.0000 mL | Freq: Once | ORAL | Status: AC
  Administered 2017-08-25: 30 mL via ORAL
  Filled 2017-08-25: qty 30

## 2017-08-25 MED ORDER — TECHNETIUM TC 99M TETROFOSMIN IV KIT
13.9800 | PACK | Freq: Once | INTRAVENOUS | Status: AC | PRN
  Administered 2017-08-25: 13.98 via INTRAVENOUS

## 2017-08-25 MED ORDER — TECHNETIUM TC 99M TETROFOSMIN IV KIT
30.0000 | PACK | Freq: Once | INTRAVENOUS | Status: AC | PRN
  Administered 2017-08-25: 32.65 via INTRAVENOUS

## 2017-08-25 MED ORDER — LOSARTAN POTASSIUM 50 MG PO TABS: 100.0000 mg | ORAL_TABLET | Freq: Every day | ORAL | Status: DC

## 2017-08-25 MED ORDER — POTASSIUM CHLORIDE CRYS ER 20 MEQ PO TBCR
20.0000 meq | EXTENDED_RELEASE_TABLET | Freq: Every day | ORAL | Status: DC
  Administered 2017-08-25: 20 meq via ORAL
  Filled 2017-08-25: qty 1

## 2017-08-25 MED ORDER — ESCITALOPRAM OXALATE 10 MG PO TABS
10.0000 mg | ORAL_TABLET | Freq: Every day | ORAL | Status: DC
  Filled 2017-08-25: qty 1

## 2017-08-25 MED ORDER — LACTATED RINGERS IV SOLN
INTRAVENOUS | Status: AC
  Administered 2017-08-25: 03:00:00 via INTRAVENOUS

## 2017-08-25 MED ORDER — METOPROLOL TARTRATE 25 MG PO TABS: 25.0000 mg | ORAL_TABLET | Freq: Two times a day (BID) | ORAL | 0 refills | Status: DC

## 2017-08-25 MED ORDER — LOSARTAN POTASSIUM-HCTZ 100-25 MG PO TABS: 1.0000 | ORAL_TABLET | Freq: Every day | ORAL | Status: DC

## 2017-08-25 MED ORDER — PANTOPRAZOLE SODIUM 40 MG PO TBEC
40.0000 mg | DELAYED_RELEASE_TABLET | Freq: Two times a day (BID) | ORAL | Status: DC
  Administered 2017-08-25: 40 mg via ORAL
  Filled 2017-08-25: qty 1

## 2017-08-25 MED ORDER — NITROGLYCERIN 0.4 MG SL SUBL: 0.4000 mg | SUBLINGUAL_TABLET | SUBLINGUAL | Status: DC | PRN

## 2017-08-25 MED ORDER — SENNOSIDES-DOCUSATE SODIUM 8.6-50 MG PO TABS: 1.0000 | ORAL_TABLET | Freq: Every evening | ORAL | Status: DC | PRN

## 2017-08-25 MED ORDER — BUPROPION HCL ER (XL) 150 MG PO TB24
150.0000 mg | ORAL_TABLET | ORAL | Status: DC
  Administered 2017-08-25: 150 mg via ORAL
  Filled 2017-08-25: qty 1

## 2017-08-25 MED ORDER — HYDROCHLOROTHIAZIDE 25 MG PO TABS: 25.0000 mg | ORAL_TABLET | Freq: Every day | ORAL | Status: DC

## 2017-08-25 MED ORDER — OXYBUTYNIN CHLORIDE ER 10 MG PO TB24
20.0000 mg | ORAL_TABLET | Freq: Every day | ORAL | Status: DC
  Filled 2017-08-25: qty 2

## 2017-08-25 MED ORDER — MECLIZINE HCL 25 MG PO TABS
25.0000 mg | ORAL_TABLET | Freq: Three times a day (TID) | ORAL | Status: DC | PRN
  Filled 2017-08-25: qty 1

## 2017-08-25 MED ORDER — NITROGLYCERIN 2 % TD OINT
1.0000 [in_us] | TOPICAL_OINTMENT | Freq: Once | TRANSDERMAL | Status: AC
  Administered 2017-08-25: 1 [in_us] via TOPICAL
  Filled 2017-08-25: qty 1

## 2017-08-25 MED ORDER — PREDNISONE 5 MG PO TABS: ORAL_TABLET | ORAL | 0 refills | Status: DC

## 2017-08-25 MED ORDER — FAMOTIDINE 20 MG PO TABS
20.0000 mg | ORAL_TABLET | Freq: Two times a day (BID) | ORAL | Status: DC
  Administered 2017-08-25: 20 mg via ORAL
  Filled 2017-08-25: qty 1

## 2017-08-25 MED ORDER — ASPIRIN 81 MG PO CHEW
81.0000 mg | CHEWABLE_TABLET | Freq: Every day | ORAL | Status: DC
  Administered 2017-08-25: 81 mg via ORAL
  Filled 2017-08-25: qty 1

## 2017-08-25 MED ORDER — VITAMIN D3 25 MCG (1000 UNIT) PO TABS
2000.0000 [IU] | ORAL_TABLET | Freq: Every day | ORAL | Status: DC
  Administered 2017-08-25: 2000 [IU] via ORAL
  Filled 2017-08-25: qty 2

## 2017-08-25 MED ORDER — METOCLOPRAMIDE HCL 5 MG PO TABS
5.0000 mg | ORAL_TABLET | Freq: Three times a day (TID) | ORAL | Status: DC
  Administered 2017-08-25: 5 mg via ORAL
  Filled 2017-08-25: qty 1

## 2017-08-25 MED ORDER — METHYLPREDNISOLONE SODIUM SUCC 40 MG IJ SOLR
40.0000 mg | INTRAMUSCULAR | Status: AC
  Administered 2017-08-25: 40 mg via INTRAVENOUS
  Filled 2017-08-25: qty 1

## 2017-08-25 MED ORDER — ONDANSETRON HCL 4 MG/2ML IJ SOLN: 4.0000 mg | Freq: Four times a day (QID) | INTRAMUSCULAR | Status: DC | PRN

## 2017-08-25 MED ORDER — MORPHINE SULFATE (PF) 2 MG/ML IV SOLN
2.0000 mg | INTRAVENOUS | Status: DC | PRN
Start: 2017-08-25 — End: 2017-08-25
  Administered 2017-08-25 (×2): 2 mg via INTRAVENOUS
  Filled 2017-08-25 (×2): qty 1

## 2017-08-25 MED ORDER — POTASSIUM CHLORIDE CRYS ER 20 MEQ PO TBCR
40.0000 meq | EXTENDED_RELEASE_TABLET | Freq: Once | ORAL | Status: AC
  Administered 2017-08-25: 40 meq via ORAL
  Filled 2017-08-25: qty 2

## 2017-08-25 MED ORDER — KETOROLAC TROMETHAMINE 30 MG/ML IJ SOLN
30.0000 mg | INTRAMUSCULAR | Status: AC
  Administered 2017-08-25: 30 mg via INTRAVENOUS
  Filled 2017-08-25: qty 1

## 2017-08-25 MED ORDER — ENOXAPARIN SODIUM 40 MG/0.4ML ~~LOC~~ SOLN
40.0000 mg | SUBCUTANEOUS | Status: DC
  Administered 2017-08-25: 40 mg via SUBCUTANEOUS
  Filled 2017-08-25: qty 0.4

## 2017-08-25 MED ORDER — ENOXAPARIN SODIUM 40 MG/0.4ML ~~LOC~~ SOLN: 40.0000 mg | SUBCUTANEOUS | Status: DC

## 2017-08-25 MED ORDER — VITAMIN B-12 1000 MCG PO TABS
500.0000 ug | ORAL_TABLET | Freq: Every day | ORAL | Status: DC
  Administered 2017-08-25: 500 ug via ORAL
  Filled 2017-08-25: qty 1

## 2017-08-25 MED ORDER — SIMVASTATIN 20 MG PO TABS: 20.0000 mg | ORAL_TABLET | Freq: Every day | ORAL | Status: DC

## 2017-08-25 MED ORDER — ACETAMINOPHEN 325 MG PO TABS: 650.0000 mg | ORAL_TABLET | ORAL | Status: DC | PRN

## 2017-08-25 MED ORDER — REGADENOSON 0.4 MG/5ML IV SOLN
0.4000 mg | Freq: Once | INTRAVENOUS | Status: AC
  Administered 2017-08-25: 0.4 mg via INTRAVENOUS
  Filled 2017-08-25: qty 5

## 2017-08-25 MED ORDER — BISACODYL 5 MG PO TBEC: 5.0000 mg | DELAYED_RELEASE_TABLET | Freq: Every day | ORAL | Status: DC | PRN

## 2017-08-25 MED ORDER — METOPROLOL TARTRATE 25 MG PO TABS
25.0000 mg | ORAL_TABLET | Freq: Two times a day (BID) | ORAL | Status: DC
  Administered 2017-08-25: 25 mg via ORAL
  Filled 2017-08-25: qty 1

## 2017-08-25 NOTE — Care Management Obs Status (Signed)
[facility] NOTIFICATION   Patient Details  Name: Susan Shah MRN: [id number] Date of Birth: 04-26-1945   Medicare Observation Status Notification Given:  No Discharge order placed in < 24hr of being placed on observation    Katrina Stack, RN 08/25/2017, 4:53 PM

## 2017-08-25 NOTE — Progress Notes (Addendum)
Looked up Definity (perflutren) since patient has a dye allergy. Definity is derived from human plasma. Patient is a Restaurant manager, fast food.  On-call MS paged for advisement. Echo order changed for no Definity.

## 2017-08-25 NOTE — Discharge Summary (Signed)
[facility] [place] NAME: Susan Shah    MR#:  [id number]  DATE OF BIRTH:  10-Apr-1945  DATE OF ADMISSION:  08/24/2017 ADMITTING PHYSICIAN: Arta Silence, MD  DATE OF DISCHARGE: 08/25/2017  PRIMARY CARE PHYSICIAN: Ronnell Freshwater, NP    ADMISSION DIAGNOSIS:  Unstable angina pectoris ([place]) [I20.0]  DISCHARGE DIAGNOSIS:  Active Problems:   Chest pain   SECONDARY DIAGNOSIS:   Past Medical History:  Diagnosis Date  . Anxiety   . Asthma 2015   mild, seasonal allergy triggered.  . Cancer ([place]) 2013   skin cancer  on left hand  . Depression   . Esophageal dysmotility   . GERD (gastroesophageal reflux disease) 11/05/2012  . History of hiatal hernia   . Hyperlipidemia   . Hypertension   . Incontinence of urine   . Lung nodule   . OA (osteoarthritis)   . Obesity   . PONV (postoperative nausea and vomiting)   . Post-polio syndrome    contracted at 15 months old  . Pulmonary nodule 11/05/2012   RML  . Shingles 2009    HOSPITAL COURSE:   1.  Chest wall pain.  Pain over the sternum with palpation.  Patient stress test was a low risk scan.  Cardiac enzymes were negative x3.  I gave a dose of Toradol and pain subsided a little bit.  Give a dose of Solu-Medrol give a quick prednisone taper. 2.  Relative hypotension cut back on metoprolol on hold lisinopril HCT until follow-up appointment. 3.  GERD continue medications 4.  Obesity.  Weight loss needed 5.  Depression continue psychiatric medications 6.  Hyperlipidemia unspecified on Zocor   DISCHARGE CONDITIONS:   Satisfactory  CONSULTS OBTAINED:  Treatment Team:  Arta Silence, MD  DRUG ALLERGIES:   Allergies  Allergen Reactions  . Soma [Carisoprodol] Hives  . Iodinated Diagnostic Agents Rash    DISCHARGE MEDICATIONS:   Allergies as of 08/25/2017      Reactions   Soma [carisoprodol] Hives   Iodinated Diagnostic Agents Rash      Medication List    STOP  taking these medications   losartan-hydrochlorothiazide 100-25 MG tablet Commonly known as:  HYZAAR     TAKE these medications   aspirin EC 81 MG tablet Take 81 mg by mouth at bedtime.   buPROPion 150 MG 24 hr tablet Commonly known as:  WELLBUTRIN XL Take 1 tablet (150 mg total) by mouth every morning.   cetirizine 10 MG tablet Commonly known as:  ZYRTEC Take 10 mg by mouth daily.   diclofenac 50 MG EC tablet Commonly known as:  VOLTAREN Take 50 mg by mouth 2 (two) times daily as needed for mild pain.   escitalopram 10 MG tablet Commonly known as:  LEXAPRO TAKE ONE TABLET BY MOUTH EVERY DAY AT 5-7 PM   loratadine 10 MG tablet Commonly known as:  CLARITIN Take 10 mg by mouth daily as needed for allergies.   meclizine 25 MG tablet Commonly known as:  ANTIVERT Take 25 mg by mouth 3 (three) times daily.   metoCLOPramide 5 MG tablet Commonly known as:  REGLAN TAKE ONE TABLET BY MOUTH TWICE DAILY BEFORE MEALS AND ONE AT BEDTIME   metoprolol tartrate 25 MG tablet Commonly known as:  LOPRESSOR Take 1 tablet (25 mg total) by mouth 2 (two) times daily with a meal. What changed:    medication strength  how much to take   oxybutynin 10 MG 24  hr tablet Commonly known as:  DITROPAN-XL Take 2 tablets (20 mg total) by mouth daily.   pantoprazole 40 MG tablet Commonly known as:  PROTONIX TAKE ONE TABLET BY MOUTH TWICE DAILY   potassium chloride SA 20 MEQ tablet Commonly known as:  K-DUR,KLOR-CON TAKE ONE TABLET BY MOUTH EVERY DAY   predniSONE 5 MG tablet Commonly known as:  DELTASONE Four tabs po day 1; 3 tabs po day2; 2 tabs po day3; 1 tab po day4   ranitidine 150 MG tablet Commonly known as:  ZANTAC Take 150 mg by mouth daily as needed for heartburn.   simvastatin 20 MG tablet Commonly known as:  ZOCOR Take 1 tablet (20 mg total) by mouth daily at 6 PM.   traZODone 50 MG tablet Commonly known as:  DESYREL Take 50 mg by mouth at bedtime. 1-2 tablets as needed  for sleep.   vitamin B-12 500 MCG tablet Commonly known as:  CYANOCOBALAMIN Take 500 mcg by mouth daily.   Vitamin D 2000 units tablet Take 2,000 Units by mouth daily.        DISCHARGE INSTRUCTIONS:    Follow-up PMD 6 days   If you experience worsening of your admission symptoms, develop shortness of breath, life threatening emergency, suicidal or homicidal thoughts you must seek medical attention immediately by calling 911 or calling your MD immediately  if symptoms less severe.  You Must read complete instructions/literature along with all the possible adverse reactions/side effects for all the Medicines you take and that have been prescribed to you. Take any new Medicines after you have completely understood and accept all the possible adverse reactions/side effects.   Please note  You were cared for by a hospitalist during your hospital stay. If you have any questions about your discharge medications or the care you received while you were in the hospital after you are discharged, you can call the unit and asked to speak with the hospitalist on call if the hospitalist that took care of you is not available. Once you are discharged, your primary care physician will handle any further medical issues. Please note that NO REFILLS for any discharge medications will be authorized once you are discharged, as it is imperative that you return to your primary care physician (or establish a relationship with a primary care physician if you do not have one) for your aftercare needs so that they can reassess your need for medications and monitor your lab values.    Today   CHIEF COMPLAINT:   Chief Complaint  Patient presents with  . Chest Pain    HISTORY OF PRESENT ILLNESS:  Susan Shah  is a 72 y.o. female came in with chest pain   VITAL SIGNS:  Blood pressure (!) 100/48, pulse 89, temperature 98.5 F (36.9 C), temperature source Oral, resp. rate 18, height 5\' 3"  (1.6 m), weight  81.6 kg (180 lb), SpO2 93 %.   PHYSICAL EXAMINATION:  GENERAL:  72 y.o.-year-old patient lying in the bed with no acute distress.  EYES: Pupils equal, round, reactive to light and accommodation. No scleral icterus. Extraocular muscles intact.  HEENT: Head atraumatic, normocephalic. Oropharynx and nasopharynx clear.  NECK:  Supple, no jugular venous distention. No thyroid enlargement, no tenderness.  LUNGS: Normal breath sounds bilaterally, no wheezing, rales,rhonchi or crepitation. No use of accessory muscles of respiration.  CARDIOVASCULAR: S1, S2 normal. No murmurs, rubs, or gallops.  ABDOMEN: Soft, non-tender, non-distended. Bowel sounds present. No organomegaly or mass.  EXTREMITIES: 2+ pedal edema,  no cyanosis, or clubbing.  NEUROLOGIC: Cranial nerves II through XII are intact. Muscle strength 5/5 in all extremities. Sensation intact. Gait not checked.  PSYCHIATRIC: The patient is alert and oriented x 3.  SKIN: No obvious rash, lesion, or ulcer.   DATA REVIEW:   CBC Recent Labs  Lab 08/24/17 2331  WBC 9.1  HGB 11.4*  HCT 33.8*  PLT 352    Chemistries  Recent Labs  Lab 08/24/17 2331 08/25/17 0637  NA 137  --   K 3.1*  --   CL 102  --   CO2 28  --   GLUCOSE 105*  --   BUN 16  --   CREATININE 0.66  --   CALCIUM 8.7*  --   MG  --  1.9  AST 18  --   ALT 11  --   ALKPHOS 87  --   BILITOT 0.3  --     Cardiac Enzymes Recent Labs  Lab 08/25/17 1302  TROPONINI <0.03     RADIOLOGY:  Nm Myocar Multi W/spect W/wall Motion / Ef  Result Date: 08/25/2017  The study is normal.  This is a low risk study.  The left ventricular ejection fraction is normal (55-65%).  There was no ST segment deviation noted during stress.  Negative lexiscan stress LV appears well preserved. No evidence of ischemia. Low risk study.   Dg Chest Port 1 View  Result Date: 08/24/2017 CLINICAL DATA:  Acute onset of generalized chest pain. EXAM: PORTABLE CHEST 1 VIEW COMPARISON:  Chest  radiograph performed 02/24/2009 FINDINGS: The lungs are well-aerated and clear. There is no evidence of focal opacification, pleural effusion or pneumothorax. The cardiomediastinal silhouette is within normal limits. No acute osseous abnormalities are seen. IMPRESSION: No acute cardiopulmonary process seen. Electronically Signed   By: Garald Balding M.D.   On: 08/24/2017 23:30      Management plans discussed with the patient, family and they are in agreement.  CODE STATUS:     Code Status Orders  (From admission, onward)        Start     Ordered   08/25/17 0212  Full code  Continuous     08/25/17 0211    Code Status History    Date Active Date Inactive Code Status Order ID Comments User Context   05/16/2016 1455 05/16/2016 1851 Full Code [id number]  Hessie Knows, MD Inpatient   05/16/2016 1455 05/16/2016 1455 Full Code [id number]  Hessie Knows, MD Inpatient    Advance Directive Documentation     Most Recent Value  Type of Advance Directive  Healthcare Power of Attorney  Pre-existing out of facility DNR order (yellow form or pink MOST form)  -  "MOST" Form in Place?  -      TOTAL TIME TAKING CARE OF THIS PATIENT: 35 minutes.    Loletha Grayer M.D on 08/25/2017 at 3:31 PM  Between 7am to 6pm - Pager - [phone]  After 6pm go to [email or web address] - password [email or web address]  [facility] Office  [phone]  CC: Primary care physician; Ronnell Freshwater, NP

## 2017-08-25 NOTE — H&P (Signed)
[facility] [place] NAME: Susan Shah    MR#:  [id number]  DATE OF BIRTH:  October 03, 1945  DATE OF ADMISSION:  08/24/2017  PRIMARY CARE PHYSICIAN: Ronnell Freshwater, NP   REQUESTING/REFERRING PHYSICIAN: Darel Hong, MD  CHIEF COMPLAINT:   Chief Complaint  Patient presents with  . Chest Pain    HISTORY OF PRESENT ILLNESS:  Susan Shah  is a 72 y.o. female with a known history of HTN, HLD p/w 1d Hx CP. Pt states she developed CP after waking up on Sunday (08/24/2017) morning. She states she had woken up, but was still resting in bed when the pain started (~1000AM). Pain characterized as a midchest ache, constant, w/ radiation to the L jaw, as well as soreness of the B/L shoulders. Denies radiation to arm or back. Pain was initially mild, but became progressively worse over the course of the day. She states she spent most of the day resting. She states the pain was worse with deep inspiration, and increased when she was lying back. She denies associated SOB, palpitations, diaphoresis, N/V, LH/LOC. Pain had no relation to PO intake. It became severe in the evening (~2230PM), prompting pt's husband to call EMS. Pt still c/o pain at the time of my assessment, but is in no distress.  PAST MEDICAL HISTORY:   Past Medical History:  Diagnosis Date  . Anxiety   . Asthma 2015   mild, seasonal allergy triggered.  . Cancer ([place]) 2013   skin cancer  on left hand  . Depression   . Esophageal dysmotility   . GERD (gastroesophageal reflux disease) 11/05/2012  . History of hiatal hernia   . Hyperlipidemia   . Hypertension   . Incontinence of urine   . Lung nodule   . OA (osteoarthritis)   . Obesity   . PONV (postoperative nausea and vomiting)   . Post-polio syndrome    contracted at 3 months old  . Pulmonary nodule 11/05/2012   RML  . Shingles 2009    PAST SURGICAL HISTORY:   Past Surgical History:  Procedure Laterality Date  . [place] STUDY N/A 04/24/2016   Procedure: 87 HOUR PH STUDY;  Surgeon: Lucilla Lame, MD;  Location: [facility] ENDOSCOPY;  Service: Endoscopy;  Laterality: N/A;  . ABDOMINAL HYSTERECTOMY     Total  . APPENDECTOMY    . CARPOMETACARPAL ([place]) FUSION OF THUMB Left 05/16/2016   Procedure: CARPOMETACARPAL [facility]) FUSION OF THUMB;  Surgeon: Hessie Knows, MD;  Location: [facility] ORS;  Service: Orthopedics;  Laterality: Left;  . CATARACT EXTRACTION Bilateral 12/2012  . CHOLECYSTECTOMY    . COLONOSCOPY  2003  . ESOPHAGEAL MANOMETRY N/A 04/24/2016   Procedure: ESOPHAGEAL MANOMETRY (EM);  Surgeon: Lucilla Lame, MD;  Location: [facility] ENDOSCOPY;  Service: Endoscopy;  Laterality: N/A;  . ESOPHAGOGASTRODUODENOSCOPY  08/2011  . FOOT SURGERY    . HALLUX VALGUS CORRECTION    . HIP SURGERY Right 1950   tendon release r/t polio  . KNEE ARTHROSCOPY Left 06/23/2008  . RETINAL DETACHMENT SURGERY Right 03/2014  . ROTATOR CUFF REPAIR Bilateral   . URINARY SURGERY  2014   [place]    SOCIAL HISTORY:   Social History   Tobacco Use  . Smoking status: Former Smoker    Last attempt to quit: 05/09/1968    Years since quitting: 49.3  . Smokeless tobacco: Never Used  Substance Use Topics  . Alcohol use: No    FAMILY HISTORY:  Family History  Problem Relation Age of Onset  . Heart disease Mother   . Heart disease Father   . Heart disease Brother   . Stroke Maternal Grandmother   . Heart disease Maternal Grandfather   . Heart attack Brother     DRUG ALLERGIES:   Allergies  Allergen Reactions  . Soma [Carisoprodol] Hives  . Iodinated Diagnostic Agents Rash    REVIEW OF SYSTEMS:   Review of Systems  Constitutional: Negative for chills, diaphoresis, fever, malaise/fatigue and weight loss.  HENT: Negative for congestion, ear pain, hearing loss, nosebleeds, sinus pain, sore throat and tinnitus.   Eyes: Negative for blurred vision, double vision and photophobia.  Respiratory: Negative for cough, hemoptysis, sputum  production, shortness of breath and wheezing.   Cardiovascular: Positive for chest pain. Negative for palpitations, orthopnea, claudication, leg swelling and PND.  Gastrointestinal: Negative for abdominal pain, blood in stool, constipation, diarrhea, heartburn, melena, nausea and vomiting.  Genitourinary: Negative for dysuria, frequency, hematuria and urgency.  Musculoskeletal: Negative for back pain, joint pain, myalgias and neck pain.  Skin: Negative for itching and rash.  Neurological: Negative for dizziness, tingling, tremors, sensory change, speech change, focal weakness, seizures, loss of consciousness, weakness and headaches.  Psychiatric/Behavioral: Negative for memory loss. The patient does not have insomnia.    MEDICATIONS AT HOME:   Prior to Admission medications   Medication Sig Start Date End Date Taking? Authorizing Provider  aspirin EC 81 MG tablet Take 81 mg by mouth at bedtime.    Yes [provider]  buPROPion (WELLBUTRIN XL) 150 MG 24 hr tablet Take 1 tablet (150 mg total) by mouth every morning. 03/07/17  Yes Boscia, Greer Ee, NP  cetirizine (ZYRTEC) 10 MG tablet Take 10 mg by mouth daily.   Yes [provider]  Cholecalciferol (VITAMIN D) 2000 units tablet Take 2,000 Units by mouth daily.   Yes [provider]  cyanocobalamin 500 MCG tablet Take 500 mcg by mouth daily.   Yes [provider]  diclofenac (VOLTAREN) 50 MG EC tablet Take 50 mg by mouth 2 (two) times daily as needed for mild pain.   Yes [provider]  escitalopram (LEXAPRO) 10 MG tablet TAKE ONE TABLET BY MOUTH EVERY DAY AT 5-7 PM 07/08/17  Yes Boscia, Heather E, NP  losartan-hydrochlorothiazide (HYZAAR) 100-25 MG tablet Take 1 tablet by mouth daily. In am.   Yes [provider]  meclizine (ANTIVERT) 25 MG tablet Take 25 mg by mouth 3 (three) times daily.   Yes [provider]  metoCLOPramide (REGLAN) 5 MG tablet TAKE ONE TABLET BY MOUTH TWICE DAILY  BEFORE MEALS AND ONE AT BEDTIME 08/01/17  Yes Lavera Guise, MD  metoprolol tartrate (LOPRESSOR) 50 MG tablet Take 1 tablet (50 mg total) by mouth 2 (two) times daily with a meal. 03/05/17  Yes Boscia, Heather E, NP  oxybutynin (DITROPAN-XL) 10 MG 24 hr tablet Take 2 tablets (20 mg total) by mouth daily. 07/17/17  Yes Boscia, Heather E, NP  pantoprazole (PROTONIX) 40 MG tablet TAKE ONE TABLET BY MOUTH TWICE DAILY 03/04/17  Yes Boscia, Heather E, NP  potassium chloride SA (K-DUR,KLOR-CON) 20 MEQ tablet TAKE ONE TABLET BY MOUTH EVERY DAY 06/23/17  Yes Boscia, Heather E, NP  simvastatin (ZOCOR) 20 MG tablet Take 1 tablet (20 mg total) by mouth daily at 6 PM. 02/06/17  Yes Boscia, Heather E, NP  traZODone (DESYREL) 50 MG tablet Take 50 mg by mouth at bedtime. 1-2 tablets as needed  for sleep.   Yes [provider]  loratadine (CLARITIN) 10 MG tablet Take 10 mg by mouth daily as needed for allergies.    [provider]  ranitidine (ZANTAC) 150 MG tablet Take 150 mg by mouth daily as needed for heartburn.     [provider]      VITAL SIGNS:  Blood pressure (!) 93/54, pulse 72, temperature 98.4 F (36.9 C), temperature source Oral, resp. rate 16, height 5\' 3"  (1.6 m), weight 81.6 kg (180 lb), SpO2 92 %.  PHYSICAL EXAMINATION:  Physical Exam  Constitutional: She is oriented to person, place, and time. She appears well-developed and well-nourished. She is active and cooperative.  Non-toxic appearance. She does not have a sickly appearance. She does not appear ill. No distress. She is not intubated.  HENT:  Head: Normocephalic and atraumatic.  Mouth/Throat: Oropharynx is clear and moist. No oropharyngeal exudate.  Eyes: Conjunctivae, EOM and lids are normal. No scleral icterus.  Neck: Neck supple. No JVD present. No thyromegaly present.  Cardiovascular: Normal rate, regular rhythm, S1 normal, S2 normal and normal heart sounds.  No extrasystoles are present. Exam reveals no gallop,  no S3, no S4, no distant heart sounds and no friction rub.  No murmur heard. Pulmonary/Chest: Effort normal. No accessory muscle usage or stridor. No apnea, no tachypnea and no bradypnea. She is not intubated. No respiratory distress. She has decreased breath sounds in the right upper field, the right middle field, the right lower field, the left upper field, the left middle field and the left lower field. She has no wheezes. She has no rhonchi. She has no rales.  Abdominal: Soft. Bowel sounds are normal. She exhibits no distension. There is no tenderness. There is no rebound and no guarding.  Musculoskeletal: Normal range of motion.       Right lower leg: Normal. She exhibits no tenderness and no edema.       Left lower leg: Normal. She exhibits no tenderness and no edema.  Lymphadenopathy:    She has no cervical adenopathy.  Neurological: She is alert and oriented to person, place, and time. She is not disoriented.  Skin: Skin is warm, dry and intact. No rash noted. She is not diaphoretic. No erythema. No pallor.  Psychiatric: She has a normal mood and affect. Her speech is normal and behavior is normal. Judgment and thought content normal. Her mood appears not anxious. She is not agitated. Cognition and memory are normal.   LABORATORY PANEL:   CBC Recent Labs  Lab 08/24/17 2331  WBC 9.1  HGB 11.4*  HCT 33.8*  PLT 352   ------------------------------------------------------------------------------------------------------------------  Chemistries  Recent Labs  Lab 08/24/17 2331  NA 137  K 3.1*  CL 102  CO2 28  GLUCOSE 105*  BUN 16  CREATININE 0.66  CALCIUM 8.7*  AST 18  ALT 11  ALKPHOS 87  BILITOT 0.3   ------------------------------------------------------------------------------------------------------------------  Cardiac Enzymes Recent Labs  Lab 08/24/17 2331  TROPONINI <0.03    ------------------------------------------------------------------------------------------------------------------  RADIOLOGY:  Dg Chest Port 1 View  Result Date: 08/24/2017 CLINICAL DATA:  Acute onset of generalized chest pain. EXAM: PORTABLE CHEST 1 VIEW COMPARISON:  Chest radiograph performed 02/24/2009 FINDINGS: The lungs are well-aerated and clear. There is no evidence of focal opacification, pleural effusion or pneumothorax. The cardiomediastinal silhouette is within normal limits. No acute osseous abnormalities are seen. IMPRESSION: No acute cardiopulmonary process seen. Electronically Signed   By: Garald Balding M.D.   On: 08/24/2017 23:30  IMPRESSION AND PLAN:   A/P: 75F CP, suspect unstable angina. Dehydration, hypokalemia, hyperglycemia, hypocalcemia, hypoalbuminemia, normocytic anemia. -Trop-I (-) x1, 2x rpt pending -CXR (-) acute cardiopulmonary process -EKG (-) acute ST elevation -Obs Tele, continuous cardiac monitoring -Continuous pulse ox -ASA81 -O2 -Morphine, NTG PRN -NPO for non-ambulatory nuclear pharmacological stress test -Echo pending -Already on statin, beta blocker (lopressor not cardioprotective though), ARB -IVF -Replete K+ -Mag level pending -Ionized calcium pending -Prealbumin pending -Normocytic anemia likely anemia of chronic disease; no evidence of acute blood loss at present time -c/w home meds/formulary subs -NPO for stress -Lovenox -Full code -Observation, < 2 midnights   All the records are reviewed and case discussed with ED provider. Management plans discussed with the patient, family and they are in agreement.  CODE STATUS: Full code  TOTAL TIME TAKING CARE OF THIS PATIENT: 60 minutes.    Arta Silence M.D on 08/25/2017 at 2:44 AM  Between 7am to 6pm - Pager - [phone]  After 6pm go to [email or web address] - Proofreader  [facility] [place] [facility]  Office  [phone]  CC: Primary care physician; Ronnell Freshwater, NP   Note: This dictation was prepared with Dragon dictation along with smaller phrase technology. Any transcriptional errors that result from this process are unintentional.

## 2017-08-26 LAB — CALCIUM, IONIZED: Calcium, Ionized, Serum: 4.7 mg/dL (ref 4.5–5.6)

## 2017-08-28 ENCOUNTER — Encounter: Payer: Self-pay | Admitting: Nurse Practitioner

## 2017-08-28 ENCOUNTER — Ambulatory Visit (INDEPENDENT_AMBULATORY_CARE_PROVIDER_SITE_OTHER): Payer: Medicare Other | Admitting: Nurse Practitioner

## 2017-08-28 VITALS — BP 123/67 | HR 76 | Resp 16 | Ht 63.0 in | Wt 187.0 lb

## 2017-08-28 DIAGNOSIS — I10 Essential (primary) hypertension: Secondary | ICD-10-CM

## 2017-08-28 DIAGNOSIS — D649 Anemia, unspecified: Secondary | ICD-10-CM | POA: Insufficient documentation

## 2017-08-28 DIAGNOSIS — E876 Hypokalemia: Secondary | ICD-10-CM | POA: Insufficient documentation

## 2017-08-28 DIAGNOSIS — R0782 Intercostal pain: Secondary | ICD-10-CM | POA: Diagnosis not present

## 2017-08-28 DIAGNOSIS — D508 Other iron deficiency anemias: Secondary | ICD-10-CM | POA: Diagnosis not present

## 2017-08-28 DIAGNOSIS — G250 Essential tremor: Secondary | ICD-10-CM | POA: Insufficient documentation

## 2017-08-28 MED ORDER — PRAMIPEXOLE DIHYDROCHLORIDE 0.125 MG PO TABS: 0.1250 mg | ORAL_TABLET | Freq: Two times a day (BID) | ORAL | 1 refills | Status: DC

## 2017-08-28 NOTE — Progress Notes (Signed)
[facility] [place], [place] [address]  Internal MEDICINE  Office Visit Note  Patient Name: Susan Shah  [id number]  [id number]  Date of Service: 08/28/2017  Pt is here for recent hospital follow up.   Chief Complaint  Patient presents with  . Hospitalization Follow-up    Pt went to the hospital for chest pain and pain in both shoulders      The patient was seen in ER 08/24/2017 and admitted overnight due to chest pain. ECG was borderline, without evidence of acute abnormalities. Cardiac enzymes were negative X3. Her potassium was low at 3.1. She was given extra dose of PO potassium and is now taking her regular dose of potassium. Her blood pressure was low. Her losartan was d/c and metoprolol was decreased from 50mg  to 25mg  twice daily. Blood pressure and heart rate are well controlled. Upon discharge, she was diagnosed with musculoskeletal chest pain. She was given IM shot of toradal which helped to relieve her pain. She was sent home on prednisone taper and she has one more day of this left. She is fatigued, but otherwise, she feels much better.  She is concerned about tremor in both arms and hands. This is affecting her arms when active. Has not affected her ability to eat or drink or dress herself. Resolved when her arms are at rest. She as noted that this is getting worse. Family members are drawing attention to this.     Current Medication: Outpatient Encounter Medications as of 08/28/2017  Medication Sig  . aspirin EC 81 MG tablet Take 81 mg by mouth at bedtime.   Marland Kitchen buPROPion (WELLBUTRIN XL) 150 MG 24 hr tablet Take 1 tablet (150 mg total) by mouth every morning.  . cetirizine (ZYRTEC) 10 MG tablet Take 10 mg by mouth daily.  . Cholecalciferol (VITAMIN D) 2000 units tablet Take 2,000 Units by mouth daily.  . cyanocobalamin 500 MCG tablet Take 500 mcg by mouth daily.  . diclofenac (VOLTAREN) 50 MG EC tablet Take 50 mg by mouth 2 (two) times daily as  needed for mild pain.  Marland Kitchen escitalopram (LEXAPRO) 10 MG tablet TAKE ONE TABLET BY MOUTH EVERY DAY AT 5-7 PM  . loratadine (CLARITIN) 10 MG tablet Take 10 mg by mouth daily as needed for allergies.  Marland Kitchen meclizine (ANTIVERT) 25 MG tablet Take 25 mg by mouth 3 (three) times daily.  . metoCLOPramide (REGLAN) 5 MG tablet TAKE ONE TABLET BY MOUTH TWICE DAILY BEFORE MEALS AND ONE AT BEDTIME  . metoprolol tartrate (LOPRESSOR) 25 MG tablet Take 1 tablet (25 mg total) by mouth 2 (two) times daily with a meal.  . oxybutynin (DITROPAN-XL) 10 MG 24 hr tablet Take 2 tablets (20 mg total) by mouth daily.  . pantoprazole (PROTONIX) 40 MG tablet TAKE ONE TABLET BY MOUTH TWICE DAILY  . potassium chloride SA (K-DUR,KLOR-CON) 20 MEQ tablet TAKE ONE TABLET BY MOUTH EVERY DAY  . pramipexole (MIRAPEX) 0.125 MG tablet Take 1 tablet (0.125 mg total) by mouth 2 (two) times daily at 10 AM and 5 PM.  . predniSONE (DELTASONE) 5 MG tablet Four tabs po day 1; 3 tabs po day2; 2 tabs po day3; 1 tab po day4  . ranitidine (ZANTAC) 150 MG tablet Take 150 mg by mouth daily as needed for heartburn.   . simvastatin (ZOCOR) 20 MG tablet Take 1 tablet (20 mg total) by mouth daily at 6 PM.  . traZODone (DESYREL) 50 MG tablet Take 50 mg by  mouth at bedtime. 1-2 tablets as needed for sleep.   No facility-administered encounter medications on file as of 08/28/2017.     Surgical History: Past Surgical History:  Procedure Laterality Date  . [place] STUDY N/A 04/24/2016   Procedure: 50 HOUR PH STUDY;  Surgeon: Lucilla Lame, MD;  Location: [facility] ENDOSCOPY;  Service: Endoscopy;  Laterality: N/A;  . ABDOMINAL HYSTERECTOMY     Total  . APPENDECTOMY    . CARPOMETACARPAL ([place]) FUSION OF THUMB Left 05/16/2016   Procedure: CARPOMETACARPAL [facility]) FUSION OF THUMB;  Surgeon: Hessie Knows, MD;  Location: [facility] ORS;  Service: Orthopedics;  Laterality: Left;  . CATARACT EXTRACTION Bilateral 12/2012  . CHOLECYSTECTOMY    . COLONOSCOPY  2003  . ESOPHAGEAL  MANOMETRY N/A 04/24/2016   Procedure: ESOPHAGEAL MANOMETRY (EM);  Surgeon: Lucilla Lame, MD;  Location: [facility] ENDOSCOPY;  Service: Endoscopy;  Laterality: N/A;  . ESOPHAGOGASTRODUODENOSCOPY  08/2011  . FOOT SURGERY    . HALLUX VALGUS CORRECTION    . HIP SURGERY Right 1950   tendon release r/t polio  . KNEE ARTHROSCOPY Left 06/23/2008  . RETINAL DETACHMENT SURGERY Right 03/2014  . ROTATOR CUFF REPAIR Bilateral   . URINARY SURGERY  2014   [place]    Medical History: Past Medical History:  Diagnosis Date  . Anxiety   . Asthma 2015   mild, seasonal allergy triggered.  . Cancer ([place]) 2013   skin cancer  on left hand  . Depression   . Esophageal dysmotility   . GERD (gastroesophageal reflux disease) 11/05/2012  . History of hiatal hernia   . Hyperlipidemia   . Hypertension   . Incontinence of urine   . Lung nodule   . OA (osteoarthritis)   . Obesity   . PONV (postoperative nausea and vomiting)   . Post-polio syndrome    contracted at 54 months old  . Pulmonary nodule 11/05/2012   RML  . Shingles 2009    Family History: Family History  Problem Relation Age of Onset  . Heart disease Mother   . Heart disease Father   . Heart disease Brother   . Stroke Maternal Grandmother   . Heart disease Maternal Grandfather   . Heart attack Brother     Social History   Socioeconomic History  . Marital status: Married    Spouse name: Not on file  . Number of children: Not on file  . Years of education: Not on file  . Highest education level: Not on file  Occupational History  . Not on file  Social Needs  . Financial resource strain: Not on file  . Food insecurity:    Worry: Not on file    Inability: Not on file  . Transportation needs:    Medical: Not on file    Non-medical: Not on file  Tobacco Use  . Smoking status: Former Smoker    Last attempt to quit: 05/09/1968    Years since quitting: 49.3  . Smokeless tobacco: Never Used  Substance and Sexual Activity  .  Alcohol use: No  . Drug use: No  . Sexual activity: Not on file  Lifestyle  . Physical activity:    Days per week: Not on file    Minutes per session: Not on file  . Stress: Not on file  Relationships  . Social connections:    Talks on phone: Not on file    Gets together: Not on file    Attends religious service: Not on file  Active member of club or organization: Not on file    Attends meetings of clubs or organizations: Not on file    Relationship status: Not on file  . Intimate partner violence:    Fear of current or ex partner: Not on file    Emotionally abused: Not on file    Physically abused: Not on file    Forced sexual activity: Not on file  Other Topics Concern  . Not on file  Social History Narrative  . Not on file      Review of Systems  Constitutional: Negative for activity change, chills, fatigue and unexpected weight change.  HENT: Negative for congestion, postnasal drip, rhinorrhea, sneezing and sore throat.   Eyes: Negative.  Negative for redness.  Respiratory: Negative for cough, chest tightness, shortness of breath and wheezing.   Cardiovascular: Positive for chest pain. Negative for palpitations.  Gastrointestinal: Negative for abdominal pain, constipation, diarrhea, nausea and vomiting.  Endocrine: Negative for cold intolerance, heat intolerance, polydipsia, polyphagia and polyuria.  Genitourinary: Negative.  Negative for dysuria and frequency.  Musculoskeletal: Negative for arthralgias, back pain, joint swelling and neck pain.  Skin: Negative for rash.  Allergic/Immunologic: Negative for environmental allergies.  Neurological: Positive for tremors. Negative for numbness.  Hematological: Negative for adenopathy. Does not bruise/bleed easily.  Psychiatric/Behavioral: Positive for sleep disturbance. Negative for behavioral problems (Depression) and suicidal ideas. The patient is nervous/anxious.     Today's Vitals   08/28/17 1410  BP: 123/67   Pulse: 76  Resp: 16  SpO2: 96%  Weight: 187 lb (84.8 kg)  Height: 5\' 3"  (1.6 m)    Physical Exam  Constitutional: She is oriented to person, place, and time. She appears well-developed and well-nourished. No distress.  HENT:  Head: Normocephalic and atraumatic.  Nose: Nose normal.  Mouth/Throat: Oropharynx is clear and moist. No oropharyngeal exudate.  Eyes: Pupils are equal, round, and reactive to light. Conjunctivae and EOM are normal.  Neck: Normal range of motion. Neck supple. No JVD present. No tracheal deviation present. No thyromegaly present.  Cardiovascular: Normal rate, regular rhythm, normal heart sounds and intact distal pulses. Exam reveals no gallop and no friction rub.  No murmur heard. Pulmonary/Chest: Effort normal and breath sounds normal. No respiratory distress. She has no wheezes. She has no rales. She exhibits no tenderness.  Abdominal: Soft. Bowel sounds are normal. There is no tenderness.  Musculoskeletal: Normal range of motion.  Lymphadenopathy:    She has no cervical adenopathy.  Neurological: She is alert and oriented to person, place, and time. No cranial nerve deficit.  The patient has mild, fine tremor present in both outstretched hands and arms. Will resolve when her arms are at rest.   Skin: Skin is warm and dry. She is not diaphoretic.  Psychiatric: She has a normal mood and affect. Her behavior is normal. Judgment and thought content normal.  Nursing note and vitals reviewed.  Assessment/Plan: 1. Intercostal pain Reviewed labs, imaging studies, and progress notes from ER visit and admission. Treated for intercostal pain with NSAID and prednisone taper. Improved pain. Finish prednisone taper. Reassess at next visit.   2. Essential hypertension D/c losartan and decreased metoprolol dose per hospital. bp doing well. No changes today. Monitor closely.  3. Hypokalemia Continue po potassium supplement. Check bmp and adjust dosing as indicated  -  Basic Metabolic Panel (BMET)  4. Other iron deficiency anemia Mildly low hgb during hospital stay. Recheck CBC and treat as indicated.  - CBC  5.  Benign essential tremor Start mirapex 0.125mg  twice daily. Reassess at next visit.  - pramipexole (MIRAPEX) 0.125 MG tablet; Take 1 tablet (0.125 mg total) by mouth 2 (two) times daily at 10 AM and 5 PM.  Dispense: 60 tablet; Refill: 1  General Counseling: Orlene verbalizes understanding of the findings of todays visit and agrees with plan of treatment. I have discussed any further diagnostic evaluation that may be needed or ordered today. We also reviewed her medications today. she has been encouraged to call the office with any questions or concerns that should arise related to todays visit.    Counseling:  This patient was seen by Leretha Pol FNP Collaboration with Dr Lavera Guise as a part of collaborative care agreement  Orders Placed This Encounter  Procedures  . Basic Metabolic Panel (BMET)  . CBC      I have reviewed all medical records from hospital follow up including radiology reports and consults from other physicians. Appropriate follow up diagnostics will be scheduled as needed. Patient/ Family understands the plan of treatment. Time spent 25 minutes.   Dr Lavera Guise, MD Internal Medicine

## 2017-09-02 ENCOUNTER — Ambulatory Visit: Payer: Medicare Other | Attending: Neurology | Admitting: Occupational Therapy

## 2017-09-03 ENCOUNTER — Other Ambulatory Visit: Payer: Self-pay

## 2017-09-03 MED ORDER — SIMVASTATIN 20 MG PO TABS: 20.0000 mg | ORAL_TABLET | Freq: Every day | ORAL | 1 refills | Status: DC

## 2017-09-09 ENCOUNTER — Other Ambulatory Visit: Payer: Self-pay

## 2017-09-09 NOTE — Progress Notes (Signed)
Faxed cologuard order to [employer] [facility] 

## 2017-09-11 ENCOUNTER — Ambulatory Visit: Payer: Self-pay | Admitting: Internal Medicine

## 2017-09-12 ENCOUNTER — Other Ambulatory Visit: Payer: Self-pay | Admitting: Internal Medicine

## 2017-09-12 MED ORDER — RANITIDINE HCL 150 MG PO TABS: 150.0000 mg | ORAL_TABLET | Freq: Every day | ORAL | 1 refills | Status: DC | PRN

## 2017-09-12 MED ORDER — BUPROPION HCL ER (XL) 150 MG PO TB24: 150.0000 mg | ORAL_TABLET | ORAL | 1 refills | Status: DC

## 2017-09-16 ENCOUNTER — Ambulatory Visit: Payer: Self-pay | Admitting: Adult Health

## 2017-09-23 ENCOUNTER — Other Ambulatory Visit: Payer: Self-pay

## 2017-09-23 DIAGNOSIS — G250 Essential tremor: Secondary | ICD-10-CM

## 2017-09-23 MED ORDER — PRAMIPEXOLE DIHYDROCHLORIDE 0.125 MG PO TABS: 0.1250 mg | ORAL_TABLET | Freq: Two times a day (BID) | ORAL | 1 refills | Status: DC

## 2017-09-25 DIAGNOSIS — D508 Other iron deficiency anemias: Secondary | ICD-10-CM | POA: Diagnosis not present

## 2017-09-25 DIAGNOSIS — E876 Hypokalemia: Secondary | ICD-10-CM | POA: Diagnosis not present

## 2017-09-26 LAB — BASIC METABOLIC PANEL
BUN/Creatinine Ratio: 14 (ref 12–28)
BUN: 12 mg/dL (ref 8–27)
CO2: 22 mmol/L (ref 20–29)
Calcium: 9.1 mg/dL (ref 8.7–10.3)
Chloride: 104 mmol/L (ref 96–106)
Creatinine, Ser: 0.86 mg/dL (ref 0.57–1.00)
GFR calc Af Amer: 79 mL/min/{1.73_m2} (ref >59)
GFR calc non Af Amer: 68 mL/min/{1.73_m2} (ref >59)
Glucose: 96 mg/dL (ref 65–99)
Potassium: 4 mmol/L (ref 3.5–5.2)
Sodium: 144 mmol/L (ref 134–144)

## 2017-09-26 LAB — CBC
Hematocrit: 36.6 % (ref 34.0–46.6)
Hemoglobin: 11.7 g/dL (ref 11.1–15.9)
MCH: 27 pg (ref 26.6–33.0)
MCHC: 32 g/dL (ref 31.5–35.7)
MCV: 84 fL (ref 79–97)
Platelets: 344 10*3/uL (ref 150–450)
RBC: 4.34 x10E6/uL (ref 3.77–5.28)
RDW: 12.9 % (ref 12.3–15.4)
WBC: 11 10*3/uL — ABNORMAL HIGH (ref 3.4–10.8)

## 2017-09-30 ENCOUNTER — Other Ambulatory Visit: Payer: Self-pay | Admitting: Internal Medicine

## 2017-10-02 ENCOUNTER — Other Ambulatory Visit: Payer: Self-pay

## 2017-10-02 MED ORDER — METOPROLOL TARTRATE 25 MG PO TABS: 25.0000 mg | ORAL_TABLET | Freq: Two times a day (BID) | ORAL | 3 refills | Status: DC

## 2017-10-10 ENCOUNTER — Other Ambulatory Visit: Payer: Self-pay

## 2017-10-10 MED ORDER — LORATADINE 10 MG PO TABS: 10.0000 mg | ORAL_TABLET | Freq: Every day | ORAL | 5 refills | Status: DC

## 2017-10-16 ENCOUNTER — Other Ambulatory Visit: Payer: Self-pay | Admitting: Internal Medicine

## 2017-10-17 ENCOUNTER — Other Ambulatory Visit: Payer: Self-pay

## 2017-10-17 DIAGNOSIS — G250 Essential tremor: Secondary | ICD-10-CM

## 2017-10-17 MED ORDER — PRAMIPEXOLE DIHYDROCHLORIDE 0.125 MG PO TABS: 0.1250 mg | ORAL_TABLET | Freq: Two times a day (BID) | ORAL | 1 refills | Status: DC

## 2017-10-30 ENCOUNTER — Encounter: Payer: Self-pay | Admitting: Nurse Practitioner

## 2017-10-30 ENCOUNTER — Ambulatory Visit (INDEPENDENT_AMBULATORY_CARE_PROVIDER_SITE_OTHER): Payer: Medicare Other | Admitting: Nurse Practitioner

## 2017-10-30 VITALS — BP 162/81 | HR 67 | Resp 16 | Ht 63.0 in | Wt 190.0 lb

## 2017-10-30 DIAGNOSIS — G250 Essential tremor: Secondary | ICD-10-CM | POA: Diagnosis not present

## 2017-10-30 DIAGNOSIS — F329 Major depressive disorder, single episode, unspecified: Secondary | ICD-10-CM | POA: Diagnosis not present

## 2017-10-30 DIAGNOSIS — I10 Essential (primary) hypertension: Secondary | ICD-10-CM | POA: Diagnosis not present

## 2017-10-30 DIAGNOSIS — Z23 Encounter for immunization: Secondary | ICD-10-CM | POA: Diagnosis not present

## 2017-10-30 DIAGNOSIS — F32A Depression, unspecified: Secondary | ICD-10-CM

## 2017-10-30 MED ORDER — ROPINIROLE HCL 0.25 MG PO TABS: ORAL_TABLET | ORAL | 2 refills | Status: DC

## 2017-10-30 NOTE — Progress Notes (Signed)
[facility] [place], [place] [address]  Internal MEDICINE  Office Visit Note  Patient Name: Susan Shah  [id number]  [id number]  Date of Service: 10/30/2017  Chief Complaint  Patient presents with  . Hypertension  . Hyperlipidemia  . Depression  . Quality Metric Gaps    prevnar    She is concerned about tremor in both arms and hands. This is affecting her arms when active. Has not affected her ability to eat or drink or dress herself. Resolved when her arms are at rest. She as noted that this is getting worse. Family members are drawing attention to this. Started on low dose mirapex at 0.125mg  twice daily. Not helping at all with tremor. However, since starting on this medication, she has started having strong headaches nearly every day. They are relieved with tylenol headache, but this has never been an issue in the past.  She states that she did have a fall yesterday. She slid, and landed on her left knee. She had knee replacement on this knee in 2013. It is bruised and swollen right now. Is able to move it ok and strength is intact .      Current Medication: Outpatient Encounter Medications as of 10/30/2017  Medication Sig Note  . aspirin EC 81 MG tablet Take 81 mg by mouth at bedtime.    Marland Kitchen buPROPion (WELLBUTRIN XL) 150 MG 24 hr tablet Take 1 tablet (150 mg total) by mouth every morning.   . cetirizine (ZYRTEC) 10 MG tablet Take 10 mg by mouth daily.   . Cholecalciferol (VITAMIN D) 2000 units tablet Take 2,000 Units by mouth daily.   . cyanocobalamin 500 MCG tablet Take 500 mcg by mouth daily.   . diclofenac (VOLTAREN) 50 MG EC tablet Take 50 mg by mouth 2 (two) times daily as needed for mild pain.   Marland Kitchen escitalopram (LEXAPRO) 10 MG tablet TAKE ONE TABLET BY MOUTH EVERY DAY AT 5-7 PM   . loratadine (QC LORATADINE ALLERGY RELIEF) 10 MG tablet Take 1 tablet (10 mg total) by mouth daily.   . meclizine (ANTIVERT) 25 MG tablet Take 25 mg by mouth 3  (three) times daily.   . metoCLOPramide (REGLAN) 5 MG tablet TAKE ONE TABLET BY MOUTH TWICE DAILY BEFORE MEALS AND ONE AT BEDTIME   . metoprolol tartrate (LOPRESSOR) 25 MG tablet Take 1 tablet (25 mg total) by mouth 2 (two) times daily with a meal.   . oxybutynin (DITROPAN-XL) 10 MG 24 hr tablet Take 2 tablets (20 mg total) by mouth daily.   . pantoprazole (PROTONIX) 40 MG tablet TAKE ONE TABLET BY MOUTH TWICE DAILY   . potassium chloride SA (K-DUR,KLOR-CON) 20 MEQ tablet TAKE ONE TABLET BY MOUTH EVERY DAY   . predniSONE (DELTASONE) 5 MG tablet Four tabs po day 1; 3 tabs po day2; 2 tabs po day3; 1 tab po day4   . ranitidine (ZANTAC) 150 MG tablet Take 1 tablet (150 mg total) by mouth daily as needed for heartburn.   . simvastatin (ZOCOR) 20 MG tablet Take 1 tablet (20 mg total) by mouth daily at 6 PM.   . traZODone (DESYREL) 50 MG tablet Take 50 mg by mouth at bedtime. 1-2 tablets as needed for sleep.   . [DISCONTINUED] pramipexole (MIRAPEX) 0.125 MG tablet Take 1 tablet (0.125 mg total) by mouth 2 (two) times daily at 10 AM and 5 PM. 10/30/2017: causing headaches  . rOPINIRole (REQUIP) 0.25 MG tablet Take 1 tablet po  BID when needed    No facility-administered encounter medications on file as of 10/30/2017.     Surgical History: Past Surgical History:  Procedure Laterality Date  . [place] STUDY N/A 04/24/2016   Procedure: 72 HOUR PH STUDY;  Surgeon: Lucilla Lame, MD;  Location: [facility] ENDOSCOPY;  Service: Endoscopy;  Laterality: N/A;  . ABDOMINAL HYSTERECTOMY     Total  . APPENDECTOMY    . CARPOMETACARPAL ([place]) FUSION OF THUMB Left 05/16/2016   Procedure: CARPOMETACARPAL [facility]) FUSION OF THUMB;  Surgeon: Hessie Knows, MD;  Location: [facility] ORS;  Service: Orthopedics;  Laterality: Left;  . CATARACT EXTRACTION Bilateral 12/2012  . CHOLECYSTECTOMY    . COLONOSCOPY  2003  . ESOPHAGEAL MANOMETRY N/A 04/24/2016   Procedure: ESOPHAGEAL MANOMETRY (EM);  Surgeon: Lucilla Lame, MD;  Location: [facility]  ENDOSCOPY;  Service: Endoscopy;  Laterality: N/A;  . ESOPHAGOGASTRODUODENOSCOPY  08/2011  . FOOT SURGERY    . HALLUX VALGUS CORRECTION    . HIP SURGERY Right 1950   tendon release r/t polio  . KNEE ARTHROSCOPY Left 06/23/2008  . RETINAL DETACHMENT SURGERY Right 03/2014  . ROTATOR CUFF REPAIR Bilateral   . URINARY SURGERY  2014   [place]    Medical History: Past Medical History:  Diagnosis Date  . Anxiety   . Asthma 2015   mild, seasonal allergy triggered.  . Cancer ([place]) 2013   skin cancer  on left hand  . Depression   . Esophageal dysmotility   . GERD (gastroesophageal reflux disease) 11/05/2012  . History of hiatal hernia   . Hyperlipidemia   . Hypertension   . Incontinence of urine   . Lung nodule   . OA (osteoarthritis)   . Obesity   . PONV (postoperative nausea and vomiting)   . Post-polio syndrome    contracted at 49 months old  . Pulmonary nodule 11/05/2012   RML  . Shingles 2009    Family History: Family History  Problem Relation Age of Onset  . Heart disease Mother   . Heart disease Father   . Heart disease Brother   . Stroke Maternal Grandmother   . Heart disease Maternal Grandfather   . Heart attack Brother     Social History   Socioeconomic History  . Marital status: Married    Spouse name: Not on file  . Number of children: Not on file  . Years of education: Not on file  . Highest education level: Not on file  Occupational History  . Not on file  Social Needs  . Financial resource strain: Not on file  . Food insecurity:    Worry: Not on file    Inability: Not on file  . Transportation needs:    Medical: Not on file    Non-medical: Not on file  Tobacco Use  . Smoking status: Former Smoker    Last attempt to quit: 05/09/1968    Years since quitting: 49.5  . Smokeless tobacco: Never Used  Substance and Sexual Activity  . Alcohol use: No  . Drug use: No  . Sexual activity: Not on file  Lifestyle  . Physical activity:    Days per  week: Not on file    Minutes per session: Not on file  . Stress: Not on file  Relationships  . Social connections:    Talks on phone: Not on file    Gets together: Not on file    Attends religious service: Not on file    Active member of  club or organization: Not on file    Attends meetings of clubs or organizations: Not on file    Relationship status: Not on file  . Intimate partner violence:    Fear of current or ex partner: Not on file    Emotionally abused: Not on file    Physically abused: Not on file    Forced sexual activity: Not on file  Other Topics Concern  . Not on file  Social History Narrative  . Not on file      Review of Systems  Constitutional: Negative for activity change, chills, fatigue and unexpected weight change.  HENT: Negative for congestion, postnasal drip, rhinorrhea, sneezing and sore throat.   Eyes: Negative.  Negative for redness.  Respiratory: Negative for cough, chest tightness, shortness of breath and wheezing.   Cardiovascular: Negative for chest pain and palpitations.  Gastrointestinal: Negative for abdominal pain, constipation, diarrhea, nausea and vomiting.  Endocrine: Negative for cold intolerance, heat intolerance, polydipsia, polyphagia and polyuria.  Genitourinary: Negative.  Negative for dysuria and frequency.  Musculoskeletal: Negative for arthralgias, back pain, joint swelling and neck pain.  Skin: Negative for rash.  Allergic/Immunologic: Negative for environmental allergies.  Neurological: Positive for tremors and headaches. Negative for numbness.       Tremors have not improved with addition of mirapex twice daily. She is reporting a chronic, daily headache since starting the mirapex.   Hematological: Negative for adenopathy. Does not bruise/bleed easily.  Psychiatric/Behavioral: Positive for sleep disturbance. Negative for behavioral problems (Depression) and suicidal ideas. The patient is nervous/anxious.      Today's Vitals    10/30/17 1123  BP: (!) 162/81  Pulse: 67  Resp: 16  SpO2: 97%  Weight: 190 lb (86.2 kg)  Height: 5\' 3"  (1.6 m)   Physical Exam  Constitutional: She is oriented to person, place, and time. She appears well-developed and well-nourished. No distress.  HENT:  Head: Normocephalic and atraumatic.  Nose: Nose normal.  Mouth/Throat: Oropharynx is clear and moist. No oropharyngeal exudate.  Eyes: Pupils are equal, round, and reactive to light. Conjunctivae and EOM are normal.  Neck: Normal range of motion. Neck supple. No JVD present. No tracheal deviation present. No thyromegaly present.  Cardiovascular: Normal rate, regular rhythm, normal heart sounds and intact distal pulses. Exam reveals no gallop and no friction rub.  No murmur heard. Pulmonary/Chest: Effort normal and breath sounds normal. No respiratory distress. She has no wheezes. She has no rales. She exhibits no tenderness.  Abdominal: Soft. Bowel sounds are normal. There is no tenderness.  Musculoskeletal: Normal range of motion.  Lymphadenopathy:    She has no cervical adenopathy.  Neurological: She is alert and oriented to person, place, and time. No cranial nerve deficit.  The patient has mild, fine tremor present in both outstretched hands and arms. Will resolve when her arms are at rest.   Skin: Skin is warm and dry. Capillary refill takes less than 2 seconds. She is not diaphoretic.  Psychiatric: She has a normal mood and affect. Her behavior is normal. Judgment and thought content normal.  Nursing note and vitals reviewed.   Assessment/Plan: 1. Essential hypertension Stable. Continue bp medication as prescribed.   2. Benign essential tremor D/c mirapex. Not helping and likely causing daily headache. Start ropinirole 0.25mg  up to twice daily. Will reassess at next visit.  - rOPINIRole (REQUIP) 0.25 MG tablet; Take 1 tablet po BID when needed  Dispense: 60 tablet; Refill: 2  3. Flu vaccine need - Flu  Vaccine MDCK  QUAD PF  4. Depression, unspecified depression type Stable. Continue lexapro and bupropion every day.   5. Need for vaccination against Streptococcus pneumoniae using pneumococcal conjugate vaccine 13 Prescription for prevnar 13 sent to her pharmacy for administration. - Pneumococcal conjugate vaccine 13-valent IM  General Counseling: Maciel verbalizes understanding of the findings of todays visit and agrees with plan of treatment. I have discussed any further diagnostic evaluation that may be needed or ordered today. We also reviewed her medications today. she has been encouraged to call the office with any questions or concerns that should arise related to todays visit.  This patient was seen by Leretha Pol FNP Collaboration with Dr Lavera Guise as a part of collaborative care agreement  Orders Placed This Encounter  Procedures  . Flu Vaccine MDCK QUAD PF  . Pneumococcal conjugate vaccine 13-valent IM    Meds ordered this encounter  Medications  . rOPINIRole (REQUIP) 0.25 MG tablet    Sig: Take 1 tablet po BID when needed    Dispense:  60 tablet    Refill:  2    Please d/c mirapex.    Order Specific Question:   Supervising Provider    Answer:   Lavera Guise [[id number]]    Time spent: 57 Minutes      Dr Lavera Guise Internal medicine

## 2017-11-10 ENCOUNTER — Other Ambulatory Visit: Payer: Self-pay

## 2017-11-10 MED ORDER — OXYBUTYNIN CHLORIDE ER 10 MG PO TB24: 20.0000 mg | ORAL_TABLET | Freq: Every day | ORAL | 3 refills | Status: DC

## 2017-11-11 ENCOUNTER — Other Ambulatory Visit: Payer: Self-pay | Admitting: Nurse Practitioner

## 2017-11-19 ENCOUNTER — Ambulatory Visit: Payer: Self-pay

## 2017-11-23 DIAGNOSIS — R300 Dysuria: Secondary | ICD-10-CM | POA: Diagnosis not present

## 2017-11-23 DIAGNOSIS — N3001 Acute cystitis with hematuria: Secondary | ICD-10-CM | POA: Diagnosis not present

## 2017-11-26 ENCOUNTER — Other Ambulatory Visit: Payer: Self-pay | Admitting: Nurse Practitioner

## 2017-12-03 ENCOUNTER — Ambulatory Visit: Payer: Medicare Other

## 2017-12-04 DIAGNOSIS — Z23 Encounter for immunization: Secondary | ICD-10-CM | POA: Diagnosis not present

## 2017-12-15 ENCOUNTER — Ambulatory Visit (INDEPENDENT_AMBULATORY_CARE_PROVIDER_SITE_OTHER): Payer: Medicare Other | Admitting: Nurse Practitioner

## 2017-12-15 ENCOUNTER — Other Ambulatory Visit: Payer: Self-pay | Admitting: Nurse Practitioner

## 2017-12-15 ENCOUNTER — Encounter: Payer: Self-pay | Admitting: Nurse Practitioner

## 2017-12-15 VITALS — BP 160/84 | HR 78 | Resp 16 | Ht 63.0 in | Wt 185.0 lb

## 2017-12-15 DIAGNOSIS — Z0000 Encounter for general adult medical examination without abnormal findings: Secondary | ICD-10-CM | POA: Diagnosis not present

## 2017-12-15 DIAGNOSIS — Z1231 Encounter for screening mammogram for malignant neoplasm of breast: Secondary | ICD-10-CM

## 2017-12-15 DIAGNOSIS — I10 Essential (primary) hypertension: Secondary | ICD-10-CM

## 2017-12-15 DIAGNOSIS — Z1239 Encounter for other screening for malignant neoplasm of breast: Secondary | ICD-10-CM

## 2017-12-15 DIAGNOSIS — G250 Essential tremor: Secondary | ICD-10-CM | POA: Diagnosis not present

## 2017-12-15 MED ORDER — ROPINIROLE HCL 0.5 MG PO TABS: ORAL_TABLET | ORAL | 2 refills | Status: DC

## 2017-12-15 NOTE — Progress Notes (Signed)
[facility] [place], [place] [address]  Internal MEDICINE  Office Visit Note  Patient Name: Susan Shah  [id number]  [id number]  Date of Service: 12/17/2017   Pt is here for routine health maintenance examination   Chief Complaint  Patient presents with  . Annual Exam  . Hypertension  . Depression  . Hyperlipidemia  . Gastroesophageal Reflux  . Ear Problem    difficulty hearing       Patient arrives to the clinic for annual wellness visit. Patient states she feels well. She has mild maxillary  sinus pressure with dry non productive cough. Patient has seasonal allergies and takes medication for it. Patient still reports tremors bilaterally in her arms and hands. She denies any side effect from ropinirole. Going to increase the dose to manage the tremor symptoms. Otherwise, patient does not have any additional questions or concerns at this time.    Current Medication: Outpatient Encounter Medications as of 12/15/2017  Medication Sig  . aspirin EC 81 MG tablet Take 81 mg by mouth at bedtime.   Marland Kitchen buPROPion (WELLBUTRIN XL) 150 MG 24 hr tablet Take 1 tablet (150 mg total) by mouth every morning.  . cetirizine (ZYRTEC) 10 MG tablet Take 10 mg by mouth daily.  . Cholecalciferol (VITAMIN D) 2000 units tablet Take 2,000 Units by mouth daily.  . cyanocobalamin 500 MCG tablet Take 500 mcg by mouth daily.  . diclofenac (VOLTAREN) 50 MG EC tablet Take 50 mg by mouth 2 (two) times daily as needed for mild pain.  Marland Kitchen escitalopram (LEXAPRO) 10 MG tablet TAKE ONE TABLET BY MOUTH EVERY DAY AT 5-7 PM  . loratadine (QC LORATADINE ALLERGY RELIEF) 10 MG tablet Take 1 tablet (10 mg total) by mouth daily.  . meclizine (ANTIVERT) 25 MG tablet Take 25 mg by mouth 3 (three) times daily.  . metoCLOPramide (REGLAN) 5 MG tablet TAKE ONE TABLET BY MOUTH TWICE DAILY BEFORE MEALS AND ONE AT BEDTIME  . metoprolol tartrate (LOPRESSOR) 25 MG tablet Take 1 tablet (25 mg total) by  mouth 2 (two) times daily with a meal.  . oxybutynin (DITROPAN-XL) 10 MG 24 hr tablet Take 2 tablets (20 mg total) by mouth daily.  . pantoprazole (PROTONIX) 40 MG tablet TAKE ONE TABLET BY MOUTH TWICE DAILY  . potassium chloride SA (K-DUR,KLOR-CON) 20 MEQ tablet TAKE ONE TABLET BY MOUTH EVERY DAY  . predniSONE (DELTASONE) 5 MG tablet Four tabs po day 1; 3 tabs po day2; 2 tabs po day3; 1 tab po day4  . ranitidine (ZANTAC) 150 MG tablet Take 1 tablet (150 mg total) by mouth daily as needed for heartburn.  Marland Kitchen rOPINIRole (REQUIP) 0.5 MG tablet Take 1 tablet po BID when needed  . simvastatin (ZOCOR) 20 MG tablet Take 1 tablet (20 mg total) by mouth daily at 6 PM.  . traZODone (DESYREL) 50 MG tablet Take 50 mg by mouth at bedtime. 1-2 tablets as needed for sleep.  . [DISCONTINUED] oxybutynin (DITROPAN-XL) 10 MG 24 hr tablet Take 2 tablets (20 mg total) by mouth daily.  . [DISCONTINUED] pantoprazole (PROTONIX) 40 MG tablet TAKE ONE TABLET BY MOUTH TWICE DAILY  . [DISCONTINUED] rOPINIRole (REQUIP) 0.25 MG tablet Take 1 tablet po BID when needed   No facility-administered encounter medications on file as of 12/15/2017.     Surgical History: Past Surgical History:  Procedure Laterality Date  . [place] STUDY N/A 04/24/2016   Procedure: [place] STUDY;  Surgeon: Lucilla Lame,  MD;  Location: [facility] ENDOSCOPY;  Service: Endoscopy;  Laterality: N/A;  . ABDOMINAL HYSTERECTOMY     Total  . APPENDECTOMY    . CARPOMETACARPAL ([place]) FUSION OF THUMB Left 05/16/2016   Procedure: CARPOMETACARPAL [facility]) FUSION OF THUMB;  Surgeon: Hessie Knows, MD;  Location: [facility] ORS;  Service: Orthopedics;  Laterality: Left;  . CATARACT EXTRACTION Bilateral 12/2012  . CHOLECYSTECTOMY    . COLONOSCOPY  2003  . ESOPHAGEAL MANOMETRY N/A 04/24/2016   Procedure: ESOPHAGEAL MANOMETRY (EM);  Surgeon: Lucilla Lame, MD;  Location: [facility] ENDOSCOPY;  Service: Endoscopy;  Laterality: N/A;  . ESOPHAGOGASTRODUODENOSCOPY  08/2011  . FOOT SURGERY     . HALLUX VALGUS CORRECTION    . HIP SURGERY Right 1950   tendon release r/t polio  . KNEE ARTHROSCOPY Left 06/23/2008  . RETINAL DETACHMENT SURGERY Right 03/2014  . ROTATOR CUFF REPAIR Bilateral   . URINARY SURGERY  2014   [place]    Medical History: Past Medical History:  Diagnosis Date  . Anxiety   . Asthma 2015   mild, seasonal allergy triggered.  . Cancer ([place]) 2013   skin cancer  on left hand  . Depression   . Esophageal dysmotility   . GERD (gastroesophageal reflux disease) 11/05/2012  . History of hiatal hernia   . Hyperlipidemia   . Hypertension   . Incontinence of urine   . Lung nodule   . OA (osteoarthritis)   . Obesity   . PONV (postoperative nausea and vomiting)   . Post-polio syndrome    contracted at 87 months old  . Pulmonary nodule 11/05/2012   [facility]  . Shingles 2009    Family History: Family History  Problem Relation Age of Onset  . Heart disease Mother   . Heart disease Father   . Heart disease Brother   . Stroke Maternal Grandmother   . Heart disease Maternal Grandfather   . Heart attack Brother       Review of Systems  Constitutional: Negative for appetite change, chills, fatigue, fever and unexpected weight change.  HENT: Positive for congestion and sinus pressure. Negative for ear discharge, ear pain, facial swelling, rhinorrhea, sinus pain, sore throat, trouble swallowing and voice change.   Eyes: Negative for pain, discharge, itching and visual disturbance.  Respiratory: Positive for cough. Negative for choking, chest tightness, shortness of breath and wheezing.        Dry nonproductive cough  Cardiovascular: Negative for chest pain, palpitations and leg swelling.  Gastrointestinal: Positive for constipation. Negative for abdominal distention, abdominal pain, blood in stool, diarrhea, nausea and vomiting.       Pt takes laxative  Endocrine: Negative for cold intolerance, heat intolerance, polydipsia, polyphagia and polyuria.   Genitourinary: Negative for dysuria, flank pain, frequency, hematuria, pelvic pain, urgency, vaginal discharge and vaginal pain.  Musculoskeletal: Positive for gait problem. Negative for arthralgias, back pain, neck pain and neck stiffness.  Skin: Negative for color change, pallor, rash and wound.  Allergic/Immunologic: Positive for environmental allergies.  Neurological: Positive for dizziness, tremors, weakness, light-headedness and numbness. Negative for syncope, facial asymmetry and headaches.       Dizziness and light headiness in the morning when pt gets up Intermittent numbness in the right hand, resolves on its own without any interventions Bilateral arms tremors, Ropinirole dose is increased Intermittent upper and lower extremities weakness  Hematological: Negative for adenopathy.  Psychiatric/Behavioral: Positive for sleep disturbance.     Vital Signs: BP (!) 160/84   Pulse 78   Resp  16   Ht 5\' 3"  (1.6 m)   Wt 185 lb (83.9 kg)   SpO2 96%   BMI 32.77 kg/m    Physical Exam  Constitutional: She is oriented to person, place, and time. She appears well-developed and well-nourished. No distress.  HENT:  Head: Normocephalic and atraumatic.  Right Ear: External ear normal.  Left Ear: External ear normal.  Nose: Nose normal.  Mouth/Throat: Oropharynx is clear and moist. No oropharyngeal exudate.  Eyes: Pupils are equal, round, and reactive to light. Conjunctivae and EOM are normal. Right eye exhibits no discharge. Left eye exhibits no discharge. No scleral icterus.  Neck: Normal range of motion. Neck supple. No JVD present. No tracheal deviation present. No thyromegaly present.  Cardiovascular: Normal rate, regular rhythm, normal heart sounds and intact distal pulses. Exam reveals no gallop and no friction rub.  No murmur heard. Pulmonary/Chest: Effort normal and breath sounds normal. No respiratory distress. She has no wheezes. She has no rales. She exhibits no mass and no  tenderness. Right breast exhibits no inverted nipple, no mass, no nipple discharge, no skin change and no tenderness. Left breast exhibits no inverted nipple, no mass, no nipple discharge, no skin change and no tenderness. No breast swelling, tenderness, discharge or bleeding. Breasts are symmetrical.  Abdominal: Soft. Bowel sounds are normal. She exhibits no distension. There is no tenderness. There is no guarding.  Musculoskeletal: Normal range of motion. She exhibits deformity. She exhibits no edema or tenderness.  Right leg due to post polio  Lymphadenopathy:    She has no cervical adenopathy.  Neurological: She is alert and oriented to person, place, and time.  Mild, fine tremor present in bilateral upper extremities.   Skin: Skin is warm and dry. Capillary refill takes less than 2 seconds. No rash noted. She is not diaphoretic. No erythema.  Psychiatric: She has a normal mood and affect. Her behavior is normal. Judgment and thought content normal.  Nursing note and vitals reviewed.  Depression screen [facility] 2/9 12/15/2017 12/15/2017 12/15/2017 10/30/2017 08/28/2017  Decreased Interest 0 0 0 0 0  Down, Depressed, Hopeless 0 0 0 0 0  PHQ - 2 Score 0 0 0 0 0    Functional Status Survey: Is the patient deaf or have difficulty hearing?: Yes Does the patient have difficulty seeing, even when wearing glasses/contacts?: Yes Does the patient have difficulty concentrating, remembering, or making decisions?: No Does the patient have difficulty walking or climbing stairs?: Yes Does the patient have difficulty dressing or bathing?: Yes Does the patient have difficulty doing errands alone such as visiting a doctor's office or shopping?: No  MMSE - [place] Exam 12/15/2017  Orientation to time 5  Orientation to Place 5  Registration 3  Attention/ Calculation 5  Recall 3  Language- name 2 objects 2  Language- repeat 1  Language- follow 3 step command 3  Language- read & follow direction  1  Write a sentence 1  Copy design 1  Total score 30    Fall Risk  12/15/2017 12/15/2017 12/15/2017 10/30/2017 08/28/2017  Falls in the past year? 1 1 1  Yes Yes  Comment - - - - -  Number falls in past yr: 1 1 1 2  or more 2 or more  Comment - - - - -  Injury with Fall? 0 0 0 Yes -  Risk for fall due to : - - Impaired balance/gait History of fall(s);Impaired balance/gait -     LABS: Recent Results (from  the past 2160 hour(s))  Basic Metabolic Panel (BMET)     Status: None   Collection Time: 09/25/17  1:15 PM  Result Value Ref Range   Glucose 96 65 - 99 mg/dL   BUN 12 8 - 27 mg/dL   Creatinine, Ser 0.86 0.57 - 1.00 mg/dL   GFR calc non Af Amer 68 >59 mL/min/1.73   GFR calc Af Amer 79 >59 mL/min/1.73   BUN/Creatinine Ratio 14 12 - 28   Sodium 144 134 - 144 mmol/L   Potassium 4.0 3.5 - 5.2 mmol/L   Chloride 104 96 - 106 mmol/L   CO2 22 20 - 29 mmol/L   Calcium 9.1 8.7 - 10.3 mg/dL  CBC     Status: Abnormal   Collection Time: 09/25/17  1:15 PM  Result Value Ref Range   WBC 11.0 (H) 3.4 - 10.8 x10E3/uL   RBC 4.34 3.77 - 5.28 x10E6/uL   Hemoglobin 11.7 11.1 - 15.9 g/dL   Hematocrit 36.6 34.0 - 46.6 %   MCV 84 79 - 97 fL   MCH 27.0 26.6 - 33.0 pg   MCHC 32.0 31.5 - 35.7 g/dL   RDW 12.9 12.3 - 15.4 %   Platelets 344 150 - 450 x10E3/uL    Assessment/Plan: 1. Encounter for Medicare annual wellness exam Annual wellness checkup   2. Essential hypertension Elevated BP, reevaluation next visit and adjustment of medication as needed   4. Benign essential tremor Increased tremors bilaterally in arms and hands, increased dose of ropinirole to 0.5mg  twice daily. Reassess at next visit  - rOPINIRole (REQUIP) 0.5 MG tablet; Take 1 tablet po BID when needed  Dispense: 60 tablet; Refill: 2  2. Screening for breast cancer Mammogram to be scheduled.     General Counseling: Susan Shah verbalizes understanding of the findings of todays visit and agrees with plan of treatment. I have  discussed any further diagnostic evaluation that may be needed or ordered today. We also reviewed her medications today. she has been encouraged to call the office with any questions or concerns that should arise related to todays visit.    Counseling: Patient advised to get up slowly in the mornings due to lightheadedness and dizziness. She instructed slowly to change her positions from lying to sitting up to standing up to prevent falls. Patient verbalizes understanding.  Hypertension Counseling:   The following hypertensive lifestyle modification were recommended and discussed:  1. Limiting alcohol intake to less than 1 oz/day of ethanol:(24 oz of beer or 8 oz of wine or 2 oz of 100-proof whiskey). 2. Take baby ASA 81 mg daily. 3. Importance of regular aerobic exercise and losing weight. 4. Reduce dietary saturated fat and cholesterol intake for overall cardiovascular health. 5. Maintaining adequate dietary potassium, calcium, and magnesium intake. 6. Regular monitoring of the blood pressure. 7. Reduce sodium intake to less than 100 mmol/day (less than 2.3 gm of sodium or less than 6 gm of sodium choride)   This patient was seen by [place] with Dr Lavera Guise as a part of collaborative care agreement  Meds ordered this encounter  Medications  . rOPINIRole (REQUIP) 0.5 MG tablet    Sig: Take 1 tablet po BID when needed    Dispense:  60 tablet    Refill:  2    Please note increased dose    Order Specific Question:   Supervising Provider    Answer:   Lavera Guise [[id number]]    Time spent:  [place], MD  Internal Medicine

## 2017-12-17 ENCOUNTER — Other Ambulatory Visit: Payer: Self-pay

## 2017-12-17 ENCOUNTER — Ambulatory Visit (INDEPENDENT_AMBULATORY_CARE_PROVIDER_SITE_OTHER): Payer: Medicare Other

## 2017-12-17 DIAGNOSIS — G4733 Obstructive sleep apnea (adult) (pediatric): Secondary | ICD-10-CM

## 2017-12-17 DIAGNOSIS — Z1239 Encounter for other screening for malignant neoplasm of breast: Secondary | ICD-10-CM | POA: Insufficient documentation

## 2017-12-17 MED ORDER — POTASSIUM CHLORIDE CRYS ER 20 MEQ PO TBCR: 20.0000 meq | EXTENDED_RELEASE_TABLET | Freq: Every day | ORAL | 1 refills | Status: DC

## 2017-12-17 NOTE — Progress Notes (Signed)
95 percentile pressure 10     95th percentile leak 0.44   apnea index  /hr  apnea-hypopnea index  5.3 /hr   total days used  >4 hr 30 days  total days used <4 hr 0 days  Total compliance 70 percent  Susan Shah is doing much better, she states everything going good. No problems or questions at this time

## 2017-12-22 ENCOUNTER — Other Ambulatory Visit: Payer: Self-pay

## 2017-12-22 MED ORDER — MECLIZINE HCL 25 MG PO TABS: ORAL_TABLET | ORAL | 1 refills | Status: DC

## 2017-12-22 NOTE — Telephone Encounter (Signed)
Pt called she having light headache and vertigo pt had some meclizine at home as heather pt advised that take that and also we send pres and if not feeling better need to been

## 2017-12-29 ENCOUNTER — Ambulatory Visit (INDEPENDENT_AMBULATORY_CARE_PROVIDER_SITE_OTHER): Payer: Medicare Other | Admitting: Internal Medicine

## 2017-12-29 ENCOUNTER — Encounter: Payer: Self-pay | Admitting: Internal Medicine

## 2017-12-29 VITALS — BP 150/86 | HR 70 | Resp 16 | Ht 63.0 in | Wt 183.0 lb

## 2017-12-29 DIAGNOSIS — J4520 Mild intermittent asthma, uncomplicated: Secondary | ICD-10-CM

## 2017-12-29 DIAGNOSIS — I200 Unstable angina: Secondary | ICD-10-CM | POA: Diagnosis not present

## 2017-12-29 DIAGNOSIS — G4733 Obstructive sleep apnea (adult) (pediatric): Secondary | ICD-10-CM

## 2017-12-29 DIAGNOSIS — I10 Essential (primary) hypertension: Secondary | ICD-10-CM

## 2017-12-29 DIAGNOSIS — K219 Gastro-esophageal reflux disease without esophagitis: Secondary | ICD-10-CM | POA: Diagnosis not present

## 2017-12-29 DIAGNOSIS — Z9989 Dependence on other enabling machines and devices: Secondary | ICD-10-CM | POA: Diagnosis not present

## 2017-12-29 NOTE — Patient Instructions (Signed)

## 2017-12-29 NOTE — Progress Notes (Signed)
[facility] [place], [place] [address]  Pulmonary Sleep Medicine   Office Visit Note  Patient Name: Susan Shah DOB: 08-11-1945 MRN [id number]  Date of Service: 12/29/2017  Complaints/HPI: Patient is here for follow-up on CPAP compliance.  On most recent download she has excellent compliance with 30 days of greater than 4 hours of use.  She reports that she is cleaning her machine.  She is replacing filters tubing and mask seal as appropriate.  She reports excellent relief of her symptoms and states that she feels much more awake especially in the afternoons when she would typically become very somnolent.  She will continue to use CPAP at this time.  ROS  General: (-) fever, (-) chills, (-) night sweats, (-) weakness Skin: (-) rashes, (-) itching,. Eyes: (-) visual changes, (-) redness, (-) itching. Nose and Sinuses: (-) nasal stuffiness or itchiness, (-) postnasal drip, (-) nosebleeds, (-) sinus trouble. Mouth and Throat: (-) sore throat, (-) hoarseness. Neck: (-) swollen glands, (-) enlarged thyroid, (-) neck pain. Respiratory: - cough, (-) bloody sputum, - shortness of breath, - wheezing. Cardiovascular: - ankle swelling, (-) chest pain. Lymphatic: (-) lymph node enlargement. Neurologic: (-) numbness, (-) tingling. Psychiatric: (-) anxiety, (-) depression   Current Medication: Outpatient Encounter Medications as of 12/29/2017  Medication Sig  . aspirin EC 81 MG tablet Take 81 mg by mouth at bedtime.   Marland Kitchen buPROPion (WELLBUTRIN XL) 150 MG 24 hr tablet Take 1 tablet (150 mg total) by mouth every morning.  . cetirizine (ZYRTEC) 10 MG tablet Take 10 mg by mouth daily.  . Cholecalciferol (VITAMIN D) 2000 units tablet Take 2,000 Units by mouth daily.  . cyanocobalamin 500 MCG tablet Take 500 mcg by mouth daily.  . diclofenac (VOLTAREN) 50 MG EC tablet Take 50 mg by mouth 2 (two) times daily as needed for mild pain.  Marland Kitchen escitalopram (LEXAPRO) 10 MG tablet  TAKE ONE TABLET BY MOUTH EVERY DAY AT 5-7 PM  . loratadine (QC LORATADINE ALLERGY RELIEF) 10 MG tablet Take 1 tablet (10 mg total) by mouth daily.  . meclizine (ANTIVERT) 25 MG tablet Take 1 tab po 2 to 3 times a daily as need  . metoCLOPramide (REGLAN) 5 MG tablet TAKE ONE TABLET BY MOUTH TWICE DAILY BEFORE MEALS AND ONE AT BEDTIME  . metoprolol tartrate (LOPRESSOR) 25 MG tablet Take 1 tablet (25 mg total) by mouth 2 (two) times daily with a meal.  . oxybutynin (DITROPAN-XL) 10 MG 24 hr tablet Take 2 tablets (20 mg total) by mouth daily.  . pantoprazole (PROTONIX) 40 MG tablet TAKE ONE TABLET BY MOUTH TWICE DAILY  . potassium chloride SA (K-DUR,KLOR-CON) 20 MEQ tablet Take 1 tablet (20 mEq total) by mouth daily.  . predniSONE (DELTASONE) 5 MG tablet Four tabs po day 1; 3 tabs po day2; 2 tabs po day3; 1 tab po day4  . ranitidine (ZANTAC) 150 MG tablet Take 1 tablet (150 mg total) by mouth daily as needed for heartburn.  Marland Kitchen rOPINIRole (REQUIP) 0.5 MG tablet Take 1 tablet po BID when needed  . simvastatin (ZOCOR) 20 MG tablet Take 1 tablet (20 mg total) by mouth daily at 6 PM.  . traZODone (DESYREL) 50 MG tablet Take 50 mg by mouth at bedtime. 1-2 tablets as needed for sleep.   No facility-administered encounter medications on file as of 12/29/2017.     Surgical History: Past Surgical History:  Procedure Laterality Date  . [place]  04/24/2016   Procedure: 24 HOUR PH STUDY;  Surgeon: Lucilla Lame, MD;  Location: [facility] ENDOSCOPY;  Service: Endoscopy;  Laterality: N/A;  . ABDOMINAL HYSTERECTOMY     Total  . APPENDECTOMY    . CARPOMETACARPAL ([place]) FUSION OF THUMB Left 05/16/2016   Procedure: CARPOMETACARPAL [facility]) FUSION OF THUMB;  Surgeon: Hessie Knows, MD;  Location: [facility] ORS;  Service: Orthopedics;  Laterality: Left;  . CATARACT EXTRACTION Bilateral 12/2012  . CHOLECYSTECTOMY    . COLONOSCOPY  2003  . ESOPHAGEAL MANOMETRY N/A 04/24/2016   Procedure: ESOPHAGEAL MANOMETRY (EM);  Surgeon:  Lucilla Lame, MD;  Location: [facility] ENDOSCOPY;  Service: Endoscopy;  Laterality: N/A;  . ESOPHAGOGASTRODUODENOSCOPY  08/2011  . FOOT SURGERY    . HALLUX VALGUS CORRECTION    . HIP SURGERY Right 1950   tendon release r/t polio  . KNEE ARTHROSCOPY Left 06/23/2008  . RETINAL DETACHMENT SURGERY Right 03/2014  . ROTATOR CUFF REPAIR Bilateral   . URINARY SURGERY  2014   [place]    Medical History: Past Medical History:  Diagnosis Date  . Anxiety   . Asthma 2015   mild, seasonal allergy triggered.  . Cancer ([place]) 2013   skin cancer  on left hand  . Depression   . Esophageal dysmotility   . GERD (gastroesophageal reflux disease) 11/05/2012  . History of hiatal hernia   . Hyperlipidemia   . Hypertension   . Incontinence of urine   . Lung nodule   . OA (osteoarthritis)   . Obesity   . PONV (postoperative nausea and vomiting)   . Post-polio syndrome    contracted at 50 months old  . Pulmonary nodule 11/05/2012   RML  . Shingles 2009    Family History: Family History  Problem Relation Age of Onset  . Heart disease Mother   . Heart disease Father   . Heart disease Brother   . Stroke Maternal Grandmother   . Heart disease Maternal Grandfather   . Heart attack Brother     Social History: Social History   Socioeconomic History  . Marital status: Married    Spouse name: Not on file  . Number of children: Not on file  . Years of education: Not on file  . Highest education level: Not on file  Occupational History  . Not on file  Social Needs  . Financial resource strain: Not on file  . Food insecurity:    Worry: Not on file    Inability: Not on file  . Transportation needs:    Medical: Not on file    Non-medical: Not on file  Tobacco Use  . Smoking status: Former Smoker    Last attempt to quit: 05/09/1968    Years since quitting: 49.6  . Smokeless tobacco: Never Used  Substance and Sexual Activity  . Alcohol use: No  . Drug use: No  . Sexual activity: Not on  file  Lifestyle  . Physical activity:    Days per week: Not on file    Minutes per session: Not on file  . Stress: Not on file  Relationships  . Social connections:    Talks on phone: Not on file    Gets together: Not on file    Attends religious service: Not on file    Active member of club or organization: Not on file    Attends meetings of clubs or organizations: Not on file    Relationship status: Not on file  . Intimate partner violence:  Fear of current or ex partner: Not on file    Emotionally abused: Not on file    Physically abused: Not on file    Forced sexual activity: Not on file  Other Topics Concern  . Not on file  Social History Narrative  . Not on file    Vital Signs: Blood pressure (!) 150/86, pulse 70, resp. rate 16, height 5\' 3"  (1.6 m), weight 183 lb (83 kg), SpO2 95 %.  Examination: General Appearance: The patient is well-developed, well-nourished, and in no distress. Skin: Gross inspection of skin unremarkable. Head: normocephalic, no gross deformities. Eyes: no gross deformities noted. ENT: ears appear grossly normal no exudates. Neck: Supple. No thyromegaly. No LAD. Respiratory: clear bilateraly. Cardiovascular: Normal S1 and S2 without murmur or rub. Extremities: No cyanosis. pulses are equal. Neurologic: Alert and oriented. No involuntary movements.  LABS: No results found for this or any previous visit (from the past 2160 hour(s)).  Radiology: Nm Myocar Multi W/spect W/wall Motion / Ef  Result Date: 08/25/2017  The study is normal.  This is a low risk study.  The left ventricular ejection fraction is normal (55-65%).  There was no ST segment deviation noted during stress.  Negative lexiscan stress LV appears well preserved. No evidence of ischemia. Low risk study.   Dg Chest Port 1 View  Result Date: 08/24/2017 CLINICAL DATA:  Acute onset of generalized chest pain. EXAM: PORTABLE CHEST 1 VIEW COMPARISON:  Chest radiograph performed  02/24/2009 FINDINGS: The lungs are well-aerated and clear. There is no evidence of focal opacification, pleural effusion or pneumothorax. The cardiomediastinal silhouette is within normal limits. No acute osseous abnormalities are seen. IMPRESSION: No acute cardiopulmonary process seen. Electronically Signed   By: Garald Balding M.D.   On: 08/24/2017 23:30    No results found.  No results found.    Assessment and Plan: Patient Active Problem List   Diagnosis Date Noted  . Screening for breast cancer 12/17/2017  . Flu vaccine need 10/30/2017  . Need for vaccination against Streptococcus pneumoniae using pneumococcal conjugate vaccine 13 10/30/2017  . Hypokalemia 08/28/2017  . Absolute anemia 08/28/2017  . Benign essential tremor 08/28/2017  . Chest pain 08/25/2017  . Depression 03/23/2017  . Hiatal hernia 04/11/2016  . Anxiety   . OA (osteoarthritis)   . Hyperlipidemia   . Essential hypertension   . Post-polio syndrome   . Lung nodule   . Esophageal dysmotility   . Incontinence of urine   . Obesity   . GERD (gastroesophageal reflux disease) 11/05/2012  . Pulmonary nodule 11/05/2012  . Shingles 01/22/2007    1. OSA on CPAP Encouraged continued compliance with CPAP.  She denies any questions at this time and will continue use.  2. Mild intermittent asthma without complication Stable, continue current treatment patient will continue to use inhalers as prescribed.  3. Essential hypertension Patient's pressure slightly elevated in office today.  We will continue to follow this at future visit.  Encourage patient to discuss with her PCP.  4. Gastroesophageal reflux disease without esophagitis Stable continue current medications as prescribed.  General Counseling: I have discussed the findings of the evaluation and examination with [facility].  I have also discussed any further diagnostic evaluation thatmay be needed or ordered today. Kelse verbalizes understanding of the findings of  todays visit. We also reviewed her medications today and discussed drug interactions and side effects including but not limited excessive drowsiness and altered mental states. We also discussed that there is always  a risk not just to her but also people around her. she has been encouraged to call the office with any questions or concerns that should arise related to todays visit.    Time spent: 25 This patient was seen by Orson Gear AGNP-C in Collaboration with Dr. Devona Konig as a part of collaborative care agreement.   I have personally obtained a history, examined the patient, evaluated laboratory and imaging results, formulated the assessment and plan and placed orders.    [name], MD [facility] Pulmonary and Critical Care Sleep medicine

## 2018-01-07 DIAGNOSIS — R4189 Other symptoms and signs involving cognitive functions and awareness: Secondary | ICD-10-CM | POA: Diagnosis not present

## 2018-01-07 DIAGNOSIS — R42 Dizziness and giddiness: Secondary | ICD-10-CM | POA: Diagnosis not present

## 2018-01-07 DIAGNOSIS — R51 Headache: Secondary | ICD-10-CM | POA: Diagnosis not present

## 2018-01-07 DIAGNOSIS — G8311 Monoplegia of lower limb affecting right dominant side: Secondary | ICD-10-CM | POA: Diagnosis not present

## 2018-01-07 DIAGNOSIS — R29898 Other symptoms and signs involving the musculoskeletal system: Secondary | ICD-10-CM | POA: Diagnosis not present

## 2018-01-07 DIAGNOSIS — Z79899 Other long term (current) drug therapy: Secondary | ICD-10-CM | POA: Diagnosis not present

## 2018-01-15 ENCOUNTER — Ambulatory Visit (INDEPENDENT_AMBULATORY_CARE_PROVIDER_SITE_OTHER): Payer: Medicare Other | Admitting: Adult Health

## 2018-01-15 ENCOUNTER — Encounter: Payer: Self-pay | Admitting: Adult Health

## 2018-01-15 VITALS — BP 160/86 | Temp 98.3°F | Resp 16 | Ht 63.0 in | Wt 188.2 lb

## 2018-01-15 DIAGNOSIS — R42 Dizziness and giddiness: Secondary | ICD-10-CM | POA: Diagnosis not present

## 2018-01-15 DIAGNOSIS — F419 Anxiety disorder, unspecified: Secondary | ICD-10-CM

## 2018-01-15 DIAGNOSIS — I10 Essential (primary) hypertension: Secondary | ICD-10-CM

## 2018-01-15 NOTE — Progress Notes (Signed)
[facility] [place], [place] [address]  Internal MEDICINE  Office Visit Note  Patient Name: Susan Shah  [id number]  [id number]  Date of Service: 01/15/2018  Chief Complaint  Patient presents with  . Dizziness    going on for about 3 weeks     HPI Pt is here for a sick visit.  She reports continued vertigo for 3 weeks.  She has seen neurology and had labs drawn.  She is awaiting a referral for ENT.  Patient reports continued dizziness, she denies any nausea or vomiting.  She also does any chest pain, shortness of breath palpitations or lightheadedness.     Current Medication:  Outpatient Encounter Medications as of 01/15/2018  Medication Sig  . aspirin EC 81 MG tablet Take 81 mg by mouth at bedtime.   Marland Kitchen buPROPion (WELLBUTRIN XL) 150 MG 24 hr tablet Take 1 tablet (150 mg total) by mouth every morning.  . cetirizine (ZYRTEC) 10 MG tablet Take 10 mg by mouth daily.  . Cholecalciferol (VITAMIN D) 2000 units tablet Take 2,000 Units by mouth daily.  . cyanocobalamin 500 MCG tablet Take 500 mcg by mouth daily.  . diclofenac (VOLTAREN) 50 MG EC tablet Take 50 mg by mouth 2 (two) times daily as needed for mild pain.  Marland Kitchen escitalopram (LEXAPRO) 10 MG tablet TAKE ONE TABLET BY MOUTH EVERY DAY AT 5-7 PM  . loratadine (QC LORATADINE ALLERGY RELIEF) 10 MG tablet Take 1 tablet (10 mg total) by mouth daily.  . meclizine (ANTIVERT) 25 MG tablet Take 1 tab po 2 to 3 times a daily as need  . metoCLOPramide (REGLAN) 5 MG tablet TAKE ONE TABLET BY MOUTH TWICE DAILY BEFORE MEALS AND ONE AT BEDTIME  . metoprolol tartrate (LOPRESSOR) 25 MG tablet Take 1 tablet (25 mg total) by mouth 2 (two) times daily with a meal.  . oxybutynin (DITROPAN-XL) 10 MG 24 hr tablet Take 2 tablets (20 mg total) by mouth daily.  . pantoprazole (PROTONIX) 40 MG tablet TAKE ONE TABLET BY MOUTH TWICE DAILY  . potassium chloride SA (K-DUR,KLOR-CON) 20 MEQ tablet Take 1 tablet (20 mEq total) by  mouth daily.  . predniSONE (DELTASONE) 5 MG tablet Four tabs po day 1; 3 tabs po day2; 2 tabs po day3; 1 tab po day4  . ranitidine (ZANTAC) 150 MG tablet Take 1 tablet (150 mg total) by mouth daily as needed for heartburn.  Marland Kitchen rOPINIRole (REQUIP) 0.5 MG tablet Take 1 tablet po BID when needed  . simvastatin (ZOCOR) 20 MG tablet Take 1 tablet (20 mg total) by mouth daily at 6 PM.  . traZODone (DESYREL) 50 MG tablet Take 50 mg by mouth at bedtime. 1-2 tablets as needed for sleep.   No facility-administered encounter medications on file as of 01/15/2018.       Medical History: Past Medical History:  Diagnosis Date  . Anxiety   . Asthma 2015   mild, seasonal allergy triggered.  . Cancer ([place]) 2013   skin cancer  on left hand  . Depression   . Esophageal dysmotility   . GERD (gastroesophageal reflux disease) 11/05/2012  . History of hiatal hernia   . Hyperlipidemia   . Hypertension   . Incontinence of urine   . Lung nodule   . OA (osteoarthritis)   . Obesity   . PONV (postoperative nausea and vomiting)   . Post-polio syndrome    contracted at 55 months old  . Pulmonary nodule 11/05/2012  RML  . Shingles 2009     Vital Signs: BP (!) 160/86   Temp 98.3 F (36.8 C)   Resp 16   Ht 5\' 3"  (1.6 m)   Wt 188 lb 3.2 oz (85.4 kg)   BMI 33.34 kg/m    Review of Systems  Constitutional: Negative for chills, fatigue and unexpected weight change.  HENT: Negative for congestion, rhinorrhea, sneezing and sore throat.   Eyes: Negative for photophobia, pain and redness.  Respiratory: Negative for cough, chest tightness and shortness of breath.   Cardiovascular: Negative for chest pain and palpitations.  Gastrointestinal: Negative for abdominal pain, constipation, diarrhea, nausea and vomiting.  Endocrine: Negative.   Genitourinary: Negative for dysuria and frequency.  Musculoskeletal: Negative for arthralgias, back pain, joint swelling and neck pain.  Skin: Negative for rash.   Allergic/Immunologic: Negative.   Neurological: Negative for tremors and numbness.  Hematological: Negative for adenopathy. Does not bruise/bleed easily.  Psychiatric/Behavioral: Negative for behavioral problems and sleep disturbance. The patient is not nervous/anxious.     Physical Exam Vitals signs and nursing note reviewed.  Constitutional:      General: She is not in acute distress.    Appearance: She is well-developed. She is not diaphoretic.  HENT:     Head: Normocephalic and atraumatic.     Mouth/Throat:     Pharynx: No oropharyngeal exudate.  Eyes:     Pupils: Pupils are equal, round, and reactive to light.  Neck:     Musculoskeletal: Normal range of motion and neck supple.     Thyroid: No thyromegaly.     Vascular: No JVD.     Trachea: No tracheal deviation.  Cardiovascular:     Rate and Rhythm: Normal rate and regular rhythm.     Heart sounds: Normal heart sounds. No murmur. No friction rub. No gallop.   Pulmonary:     Effort: Pulmonary effort is normal. No respiratory distress.     Breath sounds: Normal breath sounds. No wheezing or rales.  Chest:     Chest wall: No tenderness.  Abdominal:     Palpations: Abdomen is soft.     Tenderness: There is no abdominal tenderness. There is no guarding.  Musculoskeletal: Normal range of motion.  Lymphadenopathy:     Cervical: No cervical adenopathy.  Skin:    General: Skin is warm and dry.  Neurological:     Mental Status: She is alert and oriented to person, place, and time.     Cranial Nerves: No cranial nerve deficit.  Psychiatric:        Behavior: Behavior normal.        Thought Content: Thought content normal.        Judgment: Judgment normal.    Assessment/Plan: 1. Vertigo Placed new referral and see MD at this time.  Encourage patient continue taking medications and to sit down when she is dizzy and avoid falls. - Ambulatory referral to ENT  2. Essential hypertension Patient blood pressure slightly  elevated at today's visit.  She denies any chest pain or shortness of breath.  We will continue to monitor at future visit.  3. Anxiety Stable, patient denies any signs currently.  Continue medication as prescribed.  General Counseling: Kaedence verbalizes understanding of the findings of todays visit and agrees with plan of treatment. I have discussed any further diagnostic evaluation that may be needed or ordered today. We also reviewed her medications today. she has been encouraged to call the office with any questions or  concerns that should arise related to todays visit.   Orders Placed This Encounter  Procedures  . Ambulatory referral to ENT    No orders of the defined types were placed in this encounter.   Time spent: 25 Minutes  This patient was seen by Orson Gear AGNP-C in Collaboration with Dr Lavera Guise as a part of collaborative care agreement.  Kendell Bane AGNP-C Internal Medicine

## 2018-01-15 NOTE — Patient Instructions (Signed)
Vertigo    Vertigo means that you feel like you are moving when you are not. Vertigo can also make you feel like things around you are moving when they are not. This feeling can come and go at any time. Vertigo often goes away on its own.  Follow these instructions at home:  · Avoid making fast movements.  · Avoid driving.  · Avoid using heavy machinery.  · Avoid doing any task or activity that might cause danger to you or other people if you would have a vertigo attack while you are doing it.  · Sit down right away if you feel dizzy or have trouble with your balance.  · Take over-the-counter and prescription medicines only as told by your doctor.  · Follow instructions from your doctor about which positions or movements you should avoid.  · Drink enough fluid to keep your pee (urine) clear or pale yellow.  · Keep all follow-up visits as told by your doctor. This is important.  Contact a doctor if:  · Medicine does not help your vertigo.  · You have a fever.  · Your problems get worse or you have new symptoms.  · Your family or friends see changes in your behavior.  · You feel sick to your stomach (nauseous) or you throw up (vomit).  · You have a “pins and needles” feeling or you are numb in part of your body.  Get help right away if:  · You have trouble moving or talking.  · You are always dizzy.  · You pass out (faint).  · You get very bad headaches.  · You feel weak or have trouble using your hands, arms, or legs.  · You have changes in your hearing.  · You have changes in your seeing (vision).  · You get a stiff neck.  · Bright light starts to bother you.  This information is not intended to replace advice given to you by your health care provider. Make sure you discuss any questions you have with your health care provider.  Document Released: 10/17/2007 Document Revised: 06/15/2015 Document Reviewed: 05/02/2014  Elsevier Interactive Patient Education © 2019 [employer]

## 2018-01-23 DIAGNOSIS — F064 Anxiety disorder due to known physiological condition: Secondary | ICD-10-CM | POA: Diagnosis not present

## 2018-01-23 DIAGNOSIS — H903 Sensorineural hearing loss, bilateral: Secondary | ICD-10-CM | POA: Diagnosis not present

## 2018-01-23 DIAGNOSIS — I10 Essential (primary) hypertension: Secondary | ICD-10-CM | POA: Diagnosis not present

## 2018-01-23 DIAGNOSIS — R42 Dizziness and giddiness: Secondary | ICD-10-CM | POA: Diagnosis not present

## 2018-01-26 ENCOUNTER — Other Ambulatory Visit: Payer: Self-pay

## 2018-01-26 DIAGNOSIS — G250 Essential tremor: Secondary | ICD-10-CM

## 2018-01-26 MED ORDER — METOPROLOL TARTRATE 25 MG PO TABS: 25.0000 mg | ORAL_TABLET | Freq: Two times a day (BID) | ORAL | 3 refills | Status: DC

## 2018-01-26 MED ORDER — ROPINIROLE HCL 0.5 MG PO TABS: ORAL_TABLET | ORAL | 2 refills | Status: DC

## 2018-02-02 ENCOUNTER — Ambulatory Visit (INDEPENDENT_AMBULATORY_CARE_PROVIDER_SITE_OTHER): Payer: Medicare Other | Admitting: Nurse Practitioner

## 2018-02-02 ENCOUNTER — Encounter: Payer: Self-pay | Admitting: Nurse Practitioner

## 2018-02-02 VITALS — BP 171/90 | HR 83 | Resp 16 | Ht 63.0 in | Wt 178.0 lb

## 2018-02-02 DIAGNOSIS — I10 Essential (primary) hypertension: Secondary | ICD-10-CM

## 2018-02-02 DIAGNOSIS — G250 Essential tremor: Secondary | ICD-10-CM

## 2018-02-02 DIAGNOSIS — R42 Dizziness and giddiness: Secondary | ICD-10-CM

## 2018-02-02 MED ORDER — LISINOPRIL 2.5 MG PO TABS: 2.5000 mg | ORAL_TABLET | Freq: Every day | ORAL | 3 refills | Status: DC

## 2018-02-02 NOTE — Progress Notes (Signed)
[facility] [place], [place] [address]  Internal MEDICINE  Office Visit Note  Patient Name: Susan Shah  [id number]  [id number]  Date of Service: 02/08/2018   Pt is here for a sick visit.  Chief Complaint  Patient presents with  . Dizziness    going for few weeks      She reports continued vertigo for 4-6 weeks.  She has seen neurology and had labs drawn.  When labs were essentially normal, she saw ENT provider. She states she was told the dizziness was not related to her inner ear or abnormal "crystals."  She continues to experience the dizziness witout nausea or vomiting. No pain in her ears and no congestion. Does have intermittent headache. States the symptoms are sometimes worse when she changes position. Of note, her blood pressure is elevated today. When reviewing recent visit notes, her blood pressure continues to increase with time.        Current Medication:  Outpatient Encounter Medications as of 02/02/2018  Medication Sig  . aspirin EC 81 MG tablet Take 81 mg by mouth at bedtime.   Marland Kitchen buPROPion (WELLBUTRIN XL) 150 MG 24 hr tablet Take 1 tablet (150 mg total) by mouth every morning.  . cetirizine (ZYRTEC) 10 MG tablet Take 10 mg by mouth daily.  . Cholecalciferol (VITAMIN D) 2000 units tablet Take 2,000 Units by mouth daily.  . cyanocobalamin 500 MCG tablet Take 500 mcg by mouth daily.  . diclofenac (VOLTAREN) 50 MG EC tablet Take 50 mg by mouth 2 (two) times daily as needed for mild pain.  Marland Kitchen escitalopram (LEXAPRO) 10 MG tablet TAKE ONE TABLET BY MOUTH EVERY DAY AT 5-7 PM  . lisinopril (PRINIVIL,ZESTRIL) 2.5 MG tablet Take 1 tablet (2.5 mg total) by mouth daily.  Marland Kitchen loratadine (QC LORATADINE ALLERGY RELIEF) 10 MG tablet Take 1 tablet (10 mg total) by mouth daily.  . meclizine (ANTIVERT) 25 MG tablet Take 1 tab po 2 to 3 times a daily as need  . metoCLOPramide (REGLAN) 5 MG tablet TAKE ONE TABLET BY MOUTH TWICE DAILY BEFORE MEALS AND ONE AT  BEDTIME  . metoprolol tartrate (LOPRESSOR) 25 MG tablet Take 1 tablet (25 mg total) by mouth 2 (two) times daily with a meal.  . oxybutynin (DITROPAN-XL) 10 MG 24 hr tablet Take 2 tablets (20 mg total) by mouth daily.  . pantoprazole (PROTONIX) 40 MG tablet TAKE ONE TABLET BY MOUTH TWICE DAILY  . potassium chloride SA (K-DUR,KLOR-CON) 20 MEQ tablet Take 1 tablet (20 mEq total) by mouth daily.  . predniSONE (DELTASONE) 5 MG tablet Four tabs po day 1; 3 tabs po day2; 2 tabs po day3; 1 tab po day4  . ranitidine (ZANTAC) 150 MG tablet Take 1 tablet (150 mg total) by mouth daily as needed for heartburn.  Marland Kitchen rOPINIRole (REQUIP) 0.5 MG tablet Take 1 tablet po BID when needed  . simvastatin (ZOCOR) 20 MG tablet Take 1 tablet (20 mg total) by mouth daily at 6 PM.  . traZODone (DESYREL) 50 MG tablet Take 50 mg by mouth at bedtime. 1-2 tablets as needed for sleep.   No facility-administered encounter medications on file as of 02/02/2018.       Medical History: Past Medical History:  Diagnosis Date  . Anxiety   . Asthma 2015   mild, seasonal allergy triggered.  . Cancer ([place]) 2013   skin cancer  on left hand  . Depression   . Esophageal dysmotility   .  GERD (gastroesophageal reflux disease) 11/05/2012  . History of hiatal hernia   . Hyperlipidemia   . Hypertension   . Incontinence of urine   . Lung nodule   . OA (osteoarthritis)   . Obesity   . PONV (postoperative nausea and vomiting)   . Post-polio syndrome    contracted at 58 months old  . Pulmonary nodule 11/05/2012   RML  . Shingles 2009     Today's Vitals   02/02/18 1450  BP: (!) 171/90  Pulse: 83  Resp: 16  SpO2: 97%  Weight: 178 lb (80.7 kg)  Height: 5\' 3"  (1.6 m)    Review of Systems  Constitutional: Positive for fatigue. Negative for appetite change, chills, fever and unexpected weight change.  HENT: Negative for congestion, ear discharge, ear pain, facial swelling, rhinorrhea, sinus pressure, sinus pain, sore throat,  trouble swallowing and voice change.   Respiratory: Negative for cough, choking, chest tightness, shortness of breath and wheezing.   Cardiovascular: Negative for chest pain, palpitations and leg swelling.       Elevated blood pressure.   Gastrointestinal: Negative for abdominal distention, abdominal pain, blood in stool, constipation, diarrhea, nausea and vomiting.  Endocrine: Negative for cold intolerance, heat intolerance, polydipsia and polyuria.  Musculoskeletal: Positive for gait problem. Negative for arthralgias, back pain, neck pain and neck stiffness.  Skin: Negative for color change, pallor, rash and wound.  Allergic/Immunologic: Positive for environmental allergies.  Neurological: Positive for dizziness, tremors, weakness, light-headedness and numbness. Negative for syncope, facial asymmetry and headaches.       Dizziness and light headiness in the morning when pt gets up Intermittent numbness in the right hand, resolves on its own without any interventions Bilateral arms tremors, Ropinirole dose is increased Intermittent upper and lower extremities weakness  Hematological: Negative for adenopathy.  Psychiatric/Behavioral: Positive for sleep disturbance.    Physical Exam Vitals signs and nursing note reviewed.  Constitutional:      General: She is not in acute distress.    Appearance: Normal appearance. She is well-developed. She is not diaphoretic.  HENT:     Head: Normocephalic and atraumatic.     Right Ear: External ear normal.     Left Ear: External ear normal.     Nose: Nose normal.     Mouth/Throat:     Pharynx: No oropharyngeal exudate.  Eyes:     General: No scleral icterus.       Right eye: No discharge.        Left eye: No discharge.     Conjunctiva/sclera: Conjunctivae normal.     Pupils: Pupils are equal, round, and reactive to light.  Neck:     Musculoskeletal: Normal range of motion and neck supple.     Thyroid: No thyromegaly.     Vascular: No JVD.      Trachea: No tracheal deviation.  Cardiovascular:     Rate and Rhythm: Normal rate and regular rhythm.     Heart sounds: Normal heart sounds. No murmur. No friction rub. No gallop.   Pulmonary:     Effort: Pulmonary effort is normal. No respiratory distress.     Breath sounds: Normal breath sounds. No wheezing or rales.  Chest:     Chest wall: No mass or tenderness.     Breasts: Breasts are symmetrical.        Right: No inverted nipple, mass, nipple discharge, skin change or tenderness.        Left: No inverted nipple, mass, nipple  discharge, skin change or tenderness.  Abdominal:     General: Bowel sounds are normal. There is no distension.     Palpations: Abdomen is soft.     Tenderness: There is no abdominal tenderness. There is no guarding.  Musculoskeletal: Normal range of motion.        General: Deformity present. No tenderness.     Comments: Right leg due to post polio  Lymphadenopathy:     Cervical: No cervical adenopathy.  Skin:    General: Skin is warm and dry.     Capillary Refill: Capillary refill takes less than 2 seconds.     Findings: No erythema or rash.  Neurological:     Mental Status: She is alert and oriented to person, place, and time. Mental status is at baseline.     Comments: Mild, fine tremor present in bilateral upper extremities.   Psychiatric:        Behavior: Behavior normal.        Thought Content: Thought content normal.        Judgment: Judgment normal.   Assessment/Plan: 1. Essential hypertension Blood pressure gradually increasing with time. Add lisinorpril 2.5mg  daily. DASH diet reviewed and information provided. Will monitor closely. - lisinopril (PRINIVIL,ZESTRIL) 2.5 MG tablet; Take 1 tablet (2.5 mg total) by mouth daily.  Dispense: 30 tablet; Refill: 3  2. Vertigo Reviewed notes from neurology and ENT. Dizziness possibly related to elevated blood pressure. Start lisinopril and monitor closely.   3. Benign essential tremor Improved.  Continue to monitor.   General Counseling: Shayla verbalizes understanding of the findings of todays visit and agrees with plan of treatment. I have discussed any further diagnostic evaluation that may be needed or ordered today. We also reviewed her medications today. she has been encouraged to call the office with any questions or concerns that should arise related to todays visit.    Counseling:  Hypertension Counseling:   The following hypertensive lifestyle modification were recommended and discussed:  1. Limiting alcohol intake to less than 1 oz/day of ethanol:(24 oz of beer or 8 oz of wine or 2 oz of 100-proof whiskey). 2. Take baby ASA 81 mg daily. 3. Importance of regular aerobic exercise and losing weight. 4. Reduce dietary saturated fat and cholesterol intake for overall cardiovascular health. 5. Maintaining adequate dietary potassium, calcium, and magnesium intake. 6. Regular monitoring of the blood pressure. 7. Reduce sodium intake to less than 100 mmol/day (less than 2.3 gm of sodium or less than 6 gm of sodium choride)   This patient was seen by [place] with Dr Lavera Guise as a part of collaborative care agreement  Meds ordered this encounter  Medications  . lisinopril (PRINIVIL,ZESTRIL) 2.5 MG tablet    Sig: Take 1 tablet (2.5 mg total) by mouth daily.    Dispense:  30 tablet    Refill:  3    Order Specific Question:   Supervising Provider    Answer:   Lavera Guise [[id number]]    Time spent: 25 Minutes

## 2018-02-08 DIAGNOSIS — R42 Dizziness and giddiness: Secondary | ICD-10-CM | POA: Insufficient documentation

## 2018-02-16 ENCOUNTER — Other Ambulatory Visit: Payer: Self-pay

## 2018-02-16 ENCOUNTER — Ambulatory Visit (INDEPENDENT_AMBULATORY_CARE_PROVIDER_SITE_OTHER): Payer: Medicare Other | Admitting: Nurse Practitioner

## 2018-02-16 ENCOUNTER — Encounter: Payer: Self-pay | Admitting: Nurse Practitioner

## 2018-02-16 VITALS — BP 140/80 | HR 69 | Resp 19 | Ht 63.0 in | Wt 178.0 lb

## 2018-02-16 DIAGNOSIS — R42 Dizziness and giddiness: Secondary | ICD-10-CM

## 2018-02-16 DIAGNOSIS — R5383 Other fatigue: Secondary | ICD-10-CM | POA: Diagnosis not present

## 2018-02-16 DIAGNOSIS — I10 Essential (primary) hypertension: Secondary | ICD-10-CM | POA: Diagnosis not present

## 2018-02-16 DIAGNOSIS — G250 Essential tremor: Secondary | ICD-10-CM

## 2018-02-16 MED ORDER — OXYBUTYNIN CHLORIDE ER 10 MG PO TB24: 20.0000 mg | ORAL_TABLET | Freq: Every day | ORAL | 3 refills | Status: DC

## 2018-02-16 MED ORDER — LISINOPRIL 5 MG PO TABS: 5.0000 mg | ORAL_TABLET | Freq: Every day | ORAL | 3 refills | Status: DC

## 2018-02-16 NOTE — Progress Notes (Signed)
[facility] [place], [place] [address]  Internal MEDICINE  Office Visit Note  Patient Name: Susan Shah  [id number]  [id number]  Date of Service: 02/16/2018  Chief Complaint  Patient presents with  . Hypertension  . Hyperlipidemia    She reports continued vertigo for 4-6 weeks.  She has seen neurology and had labs drawn.  When labs were essentially normal, she saw ENT provider. She states she was told the dizziness was not related to her inner ear or abnormal "crystals."  She continues to experience the dizziness witout nausea or vomiting. No pain in her ears and no congestion. Does have intermittent headache. States the symptoms are sometimes worse when she changes position. Of note, her blood pressure is elevated today. When reviewing recent visit notes, her blood pressure continues to increase with time. Added lisinopril 2.5mg  at her last visit due to elevation in bp. This has improved some. She states that symptoms of vertigo and dizziness are still present, however, slightly more sporadic. The patient is also reporting increased fatigue. States that she wants to sleep all of the time .      Current Medication: Outpatient Encounter Medications as of 02/16/2018  Medication Sig  . aspirin EC 81 MG tablet Take 81 mg by mouth at bedtime.   Marland Kitchen buPROPion (WELLBUTRIN XL) 150 MG 24 hr tablet Take 1 tablet (150 mg total) by mouth every morning.  . cetirizine (ZYRTEC) 10 MG tablet Take 10 mg by mouth daily.  . Cholecalciferol (VITAMIN D) 2000 units tablet Take 2,000 Units by mouth daily.  . cyanocobalamin 500 MCG tablet Take 500 mcg by mouth daily.  . diclofenac (VOLTAREN) 50 MG EC tablet Take 50 mg by mouth 2 (two) times daily as needed for mild pain.  Marland Kitchen escitalopram (LEXAPRO) 10 MG tablet TAKE ONE TABLET BY MOUTH EVERY DAY AT 5-7 PM  . lisinopril (PRINIVIL,ZESTRIL) 5 MG tablet Take 1 tablet (5 mg total) by mouth daily.  Marland Kitchen loratadine (QC LORATADINE ALLERGY  RELIEF) 10 MG tablet Take 1 tablet (10 mg total) by mouth daily.  . meclizine (ANTIVERT) 25 MG tablet Take 1 tab po 2 to 3 times a daily as need  . metoCLOPramide (REGLAN) 5 MG tablet TAKE ONE TABLET BY MOUTH TWICE DAILY BEFORE MEALS AND ONE AT BEDTIME  . metoprolol tartrate (LOPRESSOR) 25 MG tablet Take 1 tablet (25 mg total) by mouth 2 (two) times daily with a meal.  . oxybutynin (DITROPAN-XL) 10 MG 24 hr tablet Take 2 tablets (20 mg total) by mouth daily.  . pantoprazole (PROTONIX) 40 MG tablet TAKE ONE TABLET BY MOUTH TWICE DAILY  . potassium chloride SA (K-DUR,KLOR-CON) 20 MEQ tablet Take 1 tablet (20 mEq total) by mouth daily.  . predniSONE (DELTASONE) 5 MG tablet Four tabs po day 1; 3 tabs po day2; 2 tabs po day3; 1 tab po day4  . ranitidine (ZANTAC) 150 MG tablet Take 1 tablet (150 mg total) by mouth daily as needed for heartburn.  Marland Kitchen rOPINIRole (REQUIP) 0.5 MG tablet Take 1 tablet po BID when needed  . simvastatin (ZOCOR) 20 MG tablet Take 1 tablet (20 mg total) by mouth daily at 6 PM.  . traZODone (DESYREL) 50 MG tablet Take 50 mg by mouth at bedtime. 1-2 tablets as needed for sleep.  . [DISCONTINUED] lisinopril (PRINIVIL,ZESTRIL) 2.5 MG tablet Take 1 tablet (2.5 mg total) by mouth daily.   No facility-administered encounter medications on file as of 02/16/2018.  Surgical History: Past Surgical History:  Procedure Laterality Date  . [place] STUDY N/A 04/24/2016   Procedure: 80 HOUR PH STUDY;  Surgeon: Lucilla Lame, MD;  Location: [facility] ENDOSCOPY;  Service: Endoscopy;  Laterality: N/A;  . ABDOMINAL HYSTERECTOMY     Total  . APPENDECTOMY    . CARPOMETACARPAL ([place]) FUSION OF THUMB Left 05/16/2016   Procedure: CARPOMETACARPAL [facility]) FUSION OF THUMB;  Surgeon: Hessie Knows, MD;  Location: [facility] ORS;  Service: Orthopedics;  Laterality: Left;  . CATARACT EXTRACTION Bilateral 12/2012  . CHOLECYSTECTOMY    . COLONOSCOPY  2003  . ESOPHAGEAL MANOMETRY N/A 04/24/2016   Procedure: ESOPHAGEAL  MANOMETRY (EM);  Surgeon: Lucilla Lame, MD;  Location: [facility] ENDOSCOPY;  Service: Endoscopy;  Laterality: N/A;  . ESOPHAGOGASTRODUODENOSCOPY  08/2011  . FOOT SURGERY    . HALLUX VALGUS CORRECTION    . HIP SURGERY Right 1950   tendon release r/t polio  . KNEE ARTHROSCOPY Left 06/23/2008  . RETINAL DETACHMENT SURGERY Right 03/2014  . ROTATOR CUFF REPAIR Bilateral   . URINARY SURGERY  2014   [place]    Medical History: Past Medical History:  Diagnosis Date  . Anxiety   . Asthma 2015   mild, seasonal allergy triggered.  . Cancer ([place]) 2013   skin cancer  on left hand  . Depression   . Esophageal dysmotility   . GERD (gastroesophageal reflux disease) 11/05/2012  . History of hiatal hernia   . Hyperlipidemia   . Hypertension   . Incontinence of urine   . Lung nodule   . OA (osteoarthritis)   . Obesity   . PONV (postoperative nausea and vomiting)   . Post-polio syndrome    contracted at 42 months old  . Pulmonary nodule 11/05/2012   RML  . Shingles 2009    Family History: Family History  Problem Relation Age of Onset  . Heart disease Mother   . Heart disease Father   . Heart disease Brother   . Stroke Maternal Grandmother   . Heart disease Maternal Grandfather   . Heart attack Brother     Social History   Socioeconomic History  . Marital status: Married    Spouse name: Not on file  . Number of children: Not on file  . Years of education: Not on file  . Highest education level: Not on file  Occupational History  . Not on file  Social Needs  . Financial resource strain: Not on file  . Food insecurity:    Worry: Not on file    Inability: Not on file  . Transportation needs:    Medical: Not on file    Non-medical: Not on file  Tobacco Use  . Smoking status: Former Smoker    Last attempt to quit: 05/09/1968    Years since quitting: 49.8  . Smokeless tobacco: Never Used  Substance and Sexual Activity  . Alcohol use: No  . Drug use: No  . Sexual activity:  Not on file  Lifestyle  . Physical activity:    Days per week: Not on file    Minutes per session: Not on file  . Stress: Not on file  Relationships  . Social connections:    Talks on phone: Not on file    Gets together: Not on file    Attends religious service: Not on file    Active member of club or organization: Not on file    Attends meetings of clubs or organizations: Not on file  Relationship status: Not on file  . Intimate partner violence:    Fear of current or ex partner: Not on file    Emotionally abused: Not on file    Physically abused: Not on file    Forced sexual activity: Not on file  Other Topics Concern  . Not on file  Social History Narrative  . Not on file      Review of Systems  Constitutional: Positive for fatigue. Negative for appetite change, chills, fever and unexpected weight change.  HENT: Negative for congestion, ear discharge, ear pain, facial swelling, rhinorrhea, sinus pressure, sinus pain, sore throat, trouble swallowing and voice change.   Respiratory: Negative for cough, choking, chest tightness, shortness of breath and wheezing.   Cardiovascular: Negative for chest pain, palpitations and leg swelling.       Elevated blood pressure though improved since most recent visit.   Gastrointestinal: Negative for abdominal distention, abdominal pain, blood in stool, constipation, diarrhea, nausea and vomiting.  Endocrine: Negative for cold intolerance, heat intolerance, polydipsia and polyuria.  Musculoskeletal: Positive for gait problem. Negative for arthralgias, back pain, neck pain and neck stiffness.  Skin: Negative for color change, pallor, rash and wound.  Allergic/Immunologic: Positive for environmental allergies.  Neurological: Positive for dizziness, tremors, weakness, light-headedness and numbness. Negative for syncope, facial asymmetry and headaches.       Dizziness and light headiness in the morning when pt gets up Intermittent numbness in  the right hand, resolves on its own without any interventions Bilateral arms tremors, Ropinirole dose is increased Intermittent upper and lower extremities weakness  Hematological: Negative for adenopathy.  Psychiatric/Behavioral: Positive for sleep disturbance.    Today's Vitals   02/16/18 1417  BP: 140/80  Pulse: 69  Resp: 19  SpO2: 97%  Weight: 178 lb (80.7 kg)  Height: 5\' 3"  (1.6 m)    Physical Exam Vitals signs and nursing note reviewed.  Constitutional:      General: She is not in acute distress.    Appearance: Normal appearance. She is well-developed. She is not diaphoretic.  HENT:     Head: Normocephalic and atraumatic.     Right Ear: External ear normal.     Left Ear: External ear normal.     Nose: Nose normal.     Mouth/Throat:     Pharynx: No oropharyngeal exudate.  Eyes:     General: No scleral icterus.       Right eye: No discharge.        Left eye: No discharge.     Conjunctiva/sclera: Conjunctivae normal.     Pupils: Pupils are equal, round, and reactive to light.  Neck:     Musculoskeletal: Normal range of motion and neck supple.     Thyroid: No thyromegaly.     Vascular: No JVD.     Trachea: No tracheal deviation.  Cardiovascular:     Rate and Rhythm: Normal rate and regular rhythm.     Heart sounds: Normal heart sounds. No murmur. No friction rub. No gallop.   Pulmonary:     Effort: Pulmonary effort is normal. No respiratory distress.     Breath sounds: Normal breath sounds. No wheezing or rales.  Chest:     Chest wall: No mass or tenderness.     Breasts: Breasts are symmetrical.        Right: No inverted nipple, mass, nipple discharge, skin change or tenderness.        Left: No inverted nipple, mass, nipple discharge, skin  change or tenderness.  Abdominal:     General: Bowel sounds are normal. There is no distension.     Palpations: Abdomen is soft.     Tenderness: There is no abdominal tenderness. There is no guarding.  Musculoskeletal:  Normal range of motion.        General: Deformity present. No tenderness.     Comments: Right leg due to post polio  Lymphadenopathy:     Cervical: No cervical adenopathy.  Skin:    General: Skin is warm and dry.     Capillary Refill: Capillary refill takes less than 2 seconds.     Findings: No erythema or rash.  Neurological:     Mental Status: She is alert and oriented to person, place, and time. Mental status is at baseline.     Comments: Mild, fine tremor present in bilateral upper extremities.   Psychiatric:        Behavior: Behavior normal.        Thought Content: Thought content normal.        Judgment: Judgment normal.    Assessment/Plan: 1. Essential hypertension Increase lisinopril to 5mg  daily. Monitor blood pressure at home.  - lisinopril (PRINIVIL,ZESTRIL) 5 MG tablet; Take 1 tablet (5 mg total) by mouth daily.  Dispense: 30 tablet; Refill: 3  2. Other fatigue Check labs, including iron and thyroid panels. Discuss in detail at next visit.   3. Vertigo Possibly related to elevated blood pressure. Increased lisinopril. May take meclizine as needed and as indicated. Will get second opinion with ENT if symptoms persist after good control blood pressure obtained.   4. Benign essential tremor Continue requip as prescribed   General Counseling: Tyiana verbalizes understanding of the findings of todays visit and agrees with plan of treatment. I have discussed any further diagnostic evaluation that may be needed or ordered today. We also reviewed her medications today. she has been encouraged to call the office with any questions or concerns that should arise related to todays visit.   Hypertension Counseling:   The following hypertensive lifestyle modification were recommended and discussed:  1. Limiting alcohol intake to less than 1 oz/day of ethanol:(24 oz of beer or 8 oz of wine or 2 oz of 100-proof whiskey). 2. Take baby ASA 81 mg daily. 3. Importance of regular aerobic  exercise and losing weight. 4. Reduce dietary saturated fat and cholesterol intake for overall cardiovascular health. 5. Maintaining adequate dietary potassium, calcium, and magnesium intake. 6. Regular monitoring of the blood pressure. 7. Reduce sodium intake to less than 100 mmol/day (less than 2.3 gm of sodium or less than 6 gm of sodium choride)   This patient was seen by [place] with Dr Lavera Guise as a part of collaborative care agreement  Meds ordered this encounter  Medications  . lisinopril (PRINIVIL,ZESTRIL) 5 MG tablet    Sig: Take 1 tablet (5 mg total) by mouth daily.    Dispense:  30 tablet    Refill:  3    Please note increased dose    Order Specific Question:   Supervising Provider    Answer:   Lavera Guise [[id number]]    Time spent: 3 Minutes      Dr Lavera Guise Internal medicine

## 2018-03-02 ENCOUNTER — Other Ambulatory Visit: Payer: Self-pay

## 2018-03-02 MED ORDER — SIMVASTATIN 20 MG PO TABS: 20.0000 mg | ORAL_TABLET | Freq: Every day | ORAL | 1 refills | Status: DC

## 2018-03-09 ENCOUNTER — Other Ambulatory Visit: Payer: Self-pay

## 2018-03-09 MED ORDER — RANITIDINE HCL 150 MG PO TABS: 150.0000 mg | ORAL_TABLET | Freq: Every day | ORAL | 1 refills | Status: DC | PRN

## 2018-03-09 MED ORDER — BUPROPION HCL ER (XL) 150 MG PO TB24: 150.0000 mg | ORAL_TABLET | ORAL | 1 refills | Status: DC

## 2018-03-19 ENCOUNTER — Encounter: Payer: Self-pay | Admitting: Nurse Practitioner

## 2018-03-19 ENCOUNTER — Ambulatory Visit (INDEPENDENT_AMBULATORY_CARE_PROVIDER_SITE_OTHER): Payer: Medicare Other | Admitting: Nurse Practitioner

## 2018-03-19 VITALS — BP 162/82 | HR 67 | Resp 16 | Ht 63.0 in | Wt 175.8 lb

## 2018-03-19 DIAGNOSIS — R42 Dizziness and giddiness: Secondary | ICD-10-CM | POA: Diagnosis not present

## 2018-03-19 DIAGNOSIS — I10 Essential (primary) hypertension: Secondary | ICD-10-CM | POA: Diagnosis not present

## 2018-03-19 MED ORDER — LISINOPRIL 5 MG PO TABS: 10.0000 mg | ORAL_TABLET | Freq: Every day | ORAL | 3 refills | Status: DC

## 2018-03-19 NOTE — Progress Notes (Signed)
[facility], [place] [address]  Internal MEDICINE  Office Visit Note  Patient Name: Susan Shah  [id number]  [id number]  Date of Service: 04/05/2018  Chief Complaint  Patient presents with  . Medical Management of Chronic Issues    4wk follow up, head still spinning it has been going on for about 5wks  . Labs Only    review labs    She reports continued vertigo for 4-6 weeks.  She has seen neurology and had labs drawn.  When labs were essentially normal, she saw ENT provider. She states she was told the dizziness was not related to her inner ear or abnormal "crystals."  She continues to experience the dizziness witout nausea or vomiting. No pain in her ears and no congestion. Does have intermittent headache. States the symptoms are sometimes worse when she changes position. Of note, her blood pressure is elevated today. When reviewing recent visit notes, her blood pressure continues to increase with time.       Current Medication: Outpatient Encounter Medications as of 03/19/2018  Medication Sig  . aspirin EC 81 MG tablet Take 81 mg by mouth at bedtime.   Marland Kitchen buPROPion (WELLBUTRIN XL) 150 MG 24 hr tablet Take 1 tablet (150 mg total) by mouth every morning.  . cetirizine (ZYRTEC) 10 MG tablet Take 10 mg by mouth daily.  . Cholecalciferol (VITAMIN D) 2000 units tablet Take 2,000 Units by mouth daily.  . cyanocobalamin 500 MCG tablet Take 500 mcg by mouth daily.  . diclofenac (VOLTAREN) 50 MG EC tablet Take 50 mg by mouth 2 (two) times daily as needed for mild pain.  Marland Kitchen escitalopram (LEXAPRO) 10 MG tablet TAKE ONE TABLET BY MOUTH EVERY DAY AT 5-7 PM  . lisinopril (PRINIVIL,ZESTRIL) 5 MG tablet Take 2 tablets (10 mg total) by mouth daily.  Marland Kitchen loratadine (QC LORATADINE ALLERGY RELIEF) 10 MG tablet Take 1 tablet (10 mg total) by mouth daily.  . meclizine (ANTIVERT) 25 MG tablet Take 1 tab po 2 to 3 times a daily as need  . metoCLOPramide (REGLAN) 5 MG  tablet TAKE ONE TABLET BY MOUTH TWICE DAILY BEFORE MEALS AND ONE AT BEDTIME  . metoprolol tartrate (LOPRESSOR) 25 MG tablet Take 1 tablet (25 mg total) by mouth 2 (two) times daily with a meal.  . oxybutynin (DITROPAN-XL) 10 MG 24 hr tablet Take 2 tablets (20 mg total) by mouth daily.  . pantoprazole (PROTONIX) 40 MG tablet TAKE ONE TABLET BY MOUTH TWICE DAILY  . potassium chloride SA (K-DUR,KLOR-CON) 20 MEQ tablet Take 1 tablet (20 mEq total) by mouth daily.  . predniSONE (DELTASONE) 5 MG tablet Four tabs po day 1; 3 tabs po day2; 2 tabs po day3; 1 tab po day4  . ranitidine (ZANTAC) 150 MG tablet Take 1 tablet (150 mg total) by mouth daily as needed for heartburn.  Marland Kitchen rOPINIRole (REQUIP) 0.5 MG tablet Take 1 tablet po BID when needed  . simvastatin (ZOCOR) 20 MG tablet Take 1 tablet (20 mg total) by mouth daily at 6 PM.  . traZODone (DESYREL) 50 MG tablet Take 50 mg by mouth at bedtime. 1-2 tablets as needed for sleep.  . [DISCONTINUED] lisinopril (PRINIVIL,ZESTRIL) 5 MG tablet Take 1 tablet (5 mg total) by mouth daily.   No facility-administered encounter medications on file as of 03/19/2018.     Surgical History: Past Surgical History:  Procedure Laterality Date  . [place] STUDY N/A 04/24/2016   Procedure: 24  HOUR PH STUDY;  Surgeon: Lucilla Lame, MD;  Location: [facility] ENDOSCOPY;  Service: Endoscopy;  Laterality: N/A;  . ABDOMINAL HYSTERECTOMY     Total  . APPENDECTOMY    . CARPOMETACARPAL ([place]) FUSION OF THUMB Left 05/16/2016   Procedure: CARPOMETACARPAL [facility]) FUSION OF THUMB;  Surgeon: Hessie Knows, MD;  Location: [facility] ORS;  Service: Orthopedics;  Laterality: Left;  . CATARACT EXTRACTION Bilateral 12/2012  . CHOLECYSTECTOMY    . COLONOSCOPY  2003  . ESOPHAGEAL MANOMETRY N/A 04/24/2016   Procedure: ESOPHAGEAL MANOMETRY (EM);  Surgeon: Lucilla Lame, MD;  Location: [facility] ENDOSCOPY;  Service: Endoscopy;  Laterality: N/A;  . ESOPHAGOGASTRODUODENOSCOPY  08/2011  . FOOT SURGERY    . HALLUX  VALGUS CORRECTION    . HIP SURGERY Right 1950   tendon release r/t polio  . KNEE ARTHROSCOPY Left 06/23/2008  . RETINAL DETACHMENT SURGERY Right 03/2014  . ROTATOR CUFF REPAIR Bilateral   . URINARY SURGERY  2014   [place]    Medical History: Past Medical History:  Diagnosis Date  . Anxiety   . Asthma 2015   mild, seasonal allergy triggered.  . Cancer ([place]) 2013   skin cancer  on left hand  . Depression   . Esophageal dysmotility   . GERD (gastroesophageal reflux disease) 11/05/2012  . History of hiatal hernia   . Hyperlipidemia   . Hypertension   . Incontinence of urine   . Lung nodule   . OA (osteoarthritis)   . Obesity   . PONV (postoperative nausea and vomiting)   . Post-polio syndrome    contracted at 59 months old  . Pulmonary nodule 11/05/2012   RML  . Shingles 2009    Family History: Family History  Problem Relation Age of Onset  . Heart disease Mother   . Heart disease Father   . Heart disease Brother   . Stroke Maternal Grandmother   . Heart disease Maternal Grandfather   . Heart attack Brother     Social History   Socioeconomic History  . Marital status: Married    Spouse name: Not on file  . Number of children: Not on file  . Years of education: Not on file  . Highest education level: Not on file  Occupational History  . Not on file  Social Needs  . Financial resource strain: Not on file  . Food insecurity:    Worry: Not on file    Inability: Not on file  . Transportation needs:    Medical: Not on file    Non-medical: Not on file  Tobacco Use  . Smoking status: Former Smoker    Last attempt to quit: 05/09/1968    Years since quitting: 49.9  . Smokeless tobacco: Never Used  Substance and Sexual Activity  . Alcohol use: No  . Drug use: No  . Sexual activity: Not on file  Lifestyle  . Physical activity:    Days per week: Not on file    Minutes per session: Not on file  . Stress: Not on file  Relationships  . Social connections:     Talks on phone: Not on file    Gets together: Not on file    Attends religious service: Not on file    Active member of club or organization: Not on file    Attends meetings of clubs or organizations: Not on file    Relationship status: Not on file  . Intimate partner violence:    Fear of current or ex partner:  Not on file    Emotionally abused: Not on file    Physically abused: Not on file    Forced sexual activity: Not on file  Other Topics Concern  . Not on file  Social History Narrative  . Not on file      Review of Systems  Constitutional: Positive for fatigue. Negative for appetite change, chills, fever and unexpected weight change.  HENT: Negative for congestion, ear discharge, ear pain, facial swelling, rhinorrhea, sinus pressure, sinus pain, sore throat, trouble swallowing and voice change.   Respiratory: Negative for cough, choking, chest tightness, shortness of breath and wheezing.   Cardiovascular: Negative for chest pain and palpitations.       Elevated blood pressure.  Gastrointestinal: Negative for abdominal distention, abdominal pain, blood in stool, constipation, diarrhea, nausea and vomiting.  Endocrine: Negative for cold intolerance, heat intolerance, polydipsia and polyuria.  Musculoskeletal: Positive for gait problem. Negative for arthralgias, back pain, neck pain and neck stiffness.  Skin: Negative for color change, pallor, rash and wound.  Allergic/Immunologic: Positive for environmental allergies.  Neurological: Positive for dizziness, tremors, weakness, light-headedness, numbness and headaches. Negative for syncope and facial asymmetry.       Dizziness and light headiness in the morning when pt gets up Intermittent numbness in the right hand, resolves on its own without any interventions Bilateral arms tremors, Ropinirole dose is increased Intermittent upper and lower extremities weakness  Hematological: Negative for adenopathy.   Psychiatric/Behavioral: Positive for sleep disturbance.    Today's Vitals   03/19/18 1550  BP: (!) 162/82  Pulse: 67  Resp: 16  SpO2: 97%  Weight: 175 lb 12.8 oz (79.7 kg)  Height: 5\' 3"  (1.6 m)   Body mass index is 31.14 kg/m.  Physical Exam Vitals signs and nursing note reviewed.  Constitutional:      General: She is not in acute distress.    Appearance: Normal appearance. She is well-developed. She is not diaphoretic.  HENT:     Head: Normocephalic and atraumatic.     Right Ear: External ear normal.     Left Ear: External ear normal.     Nose: Nose normal.     Mouth/Throat:     Pharynx: No oropharyngeal exudate.  Eyes:     General: No scleral icterus.       Right eye: No discharge.        Left eye: No discharge.     Conjunctiva/sclera: Conjunctivae normal.     Pupils: Pupils are equal, round, and reactive to light.  Neck:     Musculoskeletal: Normal range of motion and neck supple.     Thyroid: No thyromegaly.     Vascular: No JVD.     Trachea: No tracheal deviation.  Cardiovascular:     Rate and Rhythm: Normal rate and regular rhythm.     Heart sounds: Normal heart sounds. No murmur. No friction rub. No gallop.   Pulmonary:     Effort: Pulmonary effort is normal. No respiratory distress.     Breath sounds: Normal breath sounds. No wheezing or rales.  Chest:     Chest wall: No mass or tenderness.     Breasts: Breasts are symmetrical.        Right: No inverted nipple, mass, nipple discharge, skin change or tenderness.        Left: No inverted nipple, mass, nipple discharge, skin change or tenderness.  Abdominal:     General: Bowel sounds are normal. There is no distension.  Palpations: Abdomen is soft.     Tenderness: There is no abdominal tenderness. There is no guarding.  Musculoskeletal: Normal range of motion.        General: Deformity present. No tenderness.     Comments: Right leg due to post polio  Lymphadenopathy:     Cervical: No cervical  adenopathy.  Skin:    General: Skin is warm and dry.     Capillary Refill: Capillary refill takes less than 2 seconds.     Findings: No erythema or rash.  Neurological:     Mental Status: She is alert and oriented to person, place, and time. Mental status is at baseline.     Comments: Mild, fine tremor present in bilateral upper extremities.   Psychiatric:        Behavior: Behavior normal.        Thought Content: Thought content normal.        Judgment: Judgment normal.    Assessment/Plan: 1. Essential hypertension Increase lisinopril to 10mg  daily. Continue other blood pressure medication as prescribed.  - lisinopril (PRINIVIL,ZESTRIL) 5 MG tablet; Take 2 tablets (10 mg total) by mouth daily.  Dispense: 60 tablet; Refill: 3  2. Vertigo Refer to new ENT provider for further evaluation and treatment.  - Ambulatory referral to ENT  General Counseling: Susan Shah verbalizes understanding of the findings of todays visit and agrees with plan of treatment. I have discussed any further diagnostic evaluation that may be needed or ordered today. We also reviewed her medications today. she has been encouraged to call the office with any questions or concerns that should arise related to todays visit.  Hypertension Counseling:   The following hypertensive lifestyle modification were recommended and discussed:  1. Limiting alcohol intake to less than 1 oz/day of ethanol:(24 oz of beer or 8 oz of wine or 2 oz of 100-proof whiskey). 2. Take baby ASA 81 mg daily. 3. Importance of regular aerobic exercise and losing weight. 4. Reduce dietary saturated fat and cholesterol intake for overall cardiovascular health. 5. Maintaining adequate dietary potassium, calcium, and magnesium intake. 6. Regular monitoring of the blood pressure. 7. Reduce sodium intake to less than 100 mmol/day (less than 2.3 gm of sodium or less than 6 gm of sodium choride)   This patient was seen by [place]  with Dr Lavera Guise as a part of collaborative care agreement  Orders Placed This Encounter  Procedures  . Ambulatory referral to ENT    Meds ordered this encounter  Medications  . lisinopril (PRINIVIL,ZESTRIL) 5 MG tablet    Sig: Take 2 tablets (10 mg total) by mouth daily.    Dispense:  60 tablet    Refill:  3    Please note increased dose    Order Specific Question:   Supervising Provider    Answer:   Lavera Guise [[id number]]    Time spent: 58 Minutes      Dr Lavera Guise Internal medicine

## 2018-03-19 NOTE — Progress Notes (Signed)
Pt blood pressure elevated taken twice 1st reading: 186/94 machine 2nd reading: 162/82 manually

## 2018-04-14 ENCOUNTER — Other Ambulatory Visit: Payer: Self-pay | Admitting: Nurse Practitioner

## 2018-04-14 MED ORDER — LORATADINE 10 MG PO TABS: 10.0000 mg | ORAL_TABLET | Freq: Every day | ORAL | 5 refills | Status: DC

## 2018-04-17 ENCOUNTER — Ambulatory Visit: Payer: Medicare Other | Admitting: Nurse Practitioner

## 2018-04-21 ENCOUNTER — Other Ambulatory Visit: Payer: Self-pay

## 2018-04-21 MED ORDER — METOPROLOL TARTRATE 25 MG PO TABS: 25.0000 mg | ORAL_TABLET | Freq: Two times a day (BID) | ORAL | 3 refills | Status: DC

## 2018-04-29 ENCOUNTER — Other Ambulatory Visit: Payer: Self-pay | Admitting: Nurse Practitioner

## 2018-04-29 DIAGNOSIS — G250 Essential tremor: Secondary | ICD-10-CM

## 2018-04-29 MED ORDER — ROPINIROLE HCL 0.5 MG PO TABS: ORAL_TABLET | ORAL | 2 refills | Status: DC

## 2018-05-12 ENCOUNTER — Other Ambulatory Visit: Payer: Self-pay | Admitting: Nurse Practitioner

## 2018-05-12 DIAGNOSIS — I10 Essential (primary) hypertension: Secondary | ICD-10-CM

## 2018-05-12 MED ORDER — LISINOPRIL 5 MG PO TABS: 10.0000 mg | ORAL_TABLET | Freq: Every day | ORAL | 3 refills | Status: DC

## 2018-05-13 ENCOUNTER — Other Ambulatory Visit: Payer: Self-pay | Admitting: Nurse Practitioner

## 2018-05-13 MED ORDER — OXYBUTYNIN CHLORIDE ER 10 MG PO TB24: 20.0000 mg | ORAL_TABLET | Freq: Every day | ORAL | 3 refills | Status: DC

## 2018-05-29 ENCOUNTER — Other Ambulatory Visit: Payer: Self-pay

## 2018-05-29 MED ORDER — PANTOPRAZOLE SODIUM 40 MG PO TBEC: 40.0000 mg | DELAYED_RELEASE_TABLET | Freq: Two times a day (BID) | ORAL | 1 refills | Status: DC

## 2018-06-10 ENCOUNTER — Other Ambulatory Visit: Payer: Self-pay | Admitting: Nurse Practitioner

## 2018-06-10 DIAGNOSIS — K219 Gastro-esophageal reflux disease without esophagitis: Secondary | ICD-10-CM

## 2018-06-10 MED ORDER — FAMOTIDINE 20 MG PO TABS: 20.0000 mg | ORAL_TABLET | Freq: Two times a day (BID) | ORAL | 3 refills | Status: DC

## 2018-06-10 NOTE — Progress Notes (Signed)
D/c ranitidine due to national recall. Change to famotidine 20mg  bid prn and sent to [facility]

## 2018-06-23 ENCOUNTER — Other Ambulatory Visit: Payer: Self-pay

## 2018-06-23 MED ORDER — POTASSIUM CHLORIDE CRYS ER 20 MEQ PO TBCR: 20.0000 meq | EXTENDED_RELEASE_TABLET | Freq: Every day | ORAL | 1 refills | Status: DC

## 2018-06-24 ENCOUNTER — Other Ambulatory Visit: Payer: Self-pay

## 2018-06-24 ENCOUNTER — Other Ambulatory Visit: Payer: Self-pay | Admitting: Internal Medicine

## 2018-06-24 ENCOUNTER — Ambulatory Visit (INDEPENDENT_AMBULATORY_CARE_PROVIDER_SITE_OTHER): Payer: Medicare Other

## 2018-06-24 DIAGNOSIS — G4733 Obstructive sleep apnea (adult) (pediatric): Secondary | ICD-10-CM | POA: Diagnosis not present

## 2018-06-24 NOTE — Progress Notes (Signed)
95 percentile pressure 10   95th percentile leak 0.29     apnea-hypopnea index  9.0 /hr  Total compliance 16.7 percent   She has hard time wearing up to 4 hours. No problems or questions

## 2018-07-06 ENCOUNTER — Encounter: Payer: Self-pay | Admitting: Internal Medicine

## 2018-07-06 ENCOUNTER — Other Ambulatory Visit: Payer: Self-pay

## 2018-07-06 ENCOUNTER — Ambulatory Visit (INDEPENDENT_AMBULATORY_CARE_PROVIDER_SITE_OTHER): Payer: Medicare Other | Admitting: Internal Medicine

## 2018-07-06 VITALS — BP 142/78 | HR 70 | Resp 16 | Ht 62.0 in | Wt 169.0 lb

## 2018-07-06 DIAGNOSIS — I10 Essential (primary) hypertension: Secondary | ICD-10-CM

## 2018-07-06 DIAGNOSIS — Z9989 Dependence on other enabling machines and devices: Secondary | ICD-10-CM | POA: Diagnosis not present

## 2018-07-06 DIAGNOSIS — K219 Gastro-esophageal reflux disease without esophagitis: Secondary | ICD-10-CM

## 2018-07-06 DIAGNOSIS — G4733 Obstructive sleep apnea (adult) (pediatric): Secondary | ICD-10-CM | POA: Diagnosis not present

## 2018-07-06 NOTE — Progress Notes (Signed)
[facility] [place], [place] [address]  Pulmonary Sleep Medicine   Office Visit Note  Patient Name: Susan Shah DOB: 10/01/1945 MRN [id number]  Date of Service: 07/06/2018  Complaints/HPI: PT is here for follow up on OSA.  She reports she has been having trouble sleeping with her CPAP recently. Her compliance reports shows an AHI of 9/hr with a total compliance of 16.7 percent.   ROS  General: (-) fever, (-) chills, (-) night sweats, (-) weakness Skin: (-) rashes, (-) itching,. Eyes: (-) visual changes, (-) redness, (-) itching. Nose and Sinuses: (-) nasal stuffiness or itchiness, (-) postnasal drip, (-) nosebleeds, (-) sinus trouble. Mouth and Throat: (-) sore throat, (-) hoarseness. Neck: (-) swollen glands, (-) enlarged thyroid, (-) neck pain. Respiratory: - cough, (-) bloody sputum, - shortness of breath, - wheezing. Cardiovascular: - ankle swelling, (-) chest pain. Lymphatic: (-) lymph node enlargement. Neurologic: (-) numbness, (-) tingling. Psychiatric: (-) anxiety, (-) depression   Current Medication: Outpatient Encounter Medications as of 07/06/2018  Medication Sig  . aspirin EC 81 MG tablet Take 81 mg by mouth at bedtime.   Marland Kitchen buPROPion (WELLBUTRIN XL) 150 MG 24 hr tablet Take 1 tablet (150 mg total) by mouth every morning.  . cetirizine (ZYRTEC) 10 MG tablet Take 10 mg by mouth daily.  . Cholecalciferol (VITAMIN D) 2000 units tablet Take 2,000 Units by mouth daily.  . cyanocobalamin 500 MCG tablet Take 500 mcg by mouth daily.  . diclofenac (VOLTAREN) 50 MG EC tablet Take 50 mg by mouth 2 (two) times daily as needed for mild pain.  Marland Kitchen escitalopram (LEXAPRO) 10 MG tablet TAKE ONE TABLET BY MOUTH EVERY DAY AT 5-7 PM  . famotidine (PEPCID) 20 MG tablet Take 1 tablet (20 mg total) by mouth 2 (two) times daily.  Marland Kitchen lisinopril (ZESTRIL) 5 MG tablet Take 2 tablets (10 mg total) by mouth daily.  Marland Kitchen loratadine (QC LORATADINE ALLERGY RELIEF) 10 MG  tablet Take 1 tablet (10 mg total) by mouth daily.  Marland Kitchen losartan-hydrochlorothiazide (HYZAAR) 100-25 MG tablet TAKE ONE TABLET BY MOUTH EVERY DAY  . meclizine (ANTIVERT) 25 MG tablet Take 1 tab po 2 to 3 times a daily as need  . metoCLOPramide (REGLAN) 5 MG tablet TAKE ONE TABLET BY MOUTH TWICE DAILY BEFORE MEALS AND ONE AT BEDTIME  . metoprolol tartrate (LOPRESSOR) 25 MG tablet Take 1 tablet (25 mg total) by mouth 2 (two) times daily with a meal.  . NON FORMULARY cpap device  . oxybutynin (DITROPAN-XL) 10 MG 24 hr tablet Take 2 tablets (20 mg total) by mouth daily.  . pantoprazole (PROTONIX) 40 MG tablet Take 1 tablet (40 mg total) by mouth 2 (two) times daily.  . potassium chloride SA (K-DUR) 20 MEQ tablet Take 1 tablet (20 mEq total) by mouth daily.  . predniSONE (DELTASONE) 5 MG tablet Four tabs po day 1; 3 tabs po day2; 2 tabs po day3; 1 tab po day4  . ranitidine (ZANTAC) 150 MG tablet Take 1 tablet (150 mg total) by mouth daily as needed for heartburn.  Marland Kitchen rOPINIRole (REQUIP) 0.5 MG tablet Take 1 tablet po BID when needed  . simvastatin (ZOCOR) 20 MG tablet Take 1 tablet (20 mg total) by mouth daily at 6 PM.  . traZODone (DESYREL) 50 MG tablet Take 50 mg by mouth at bedtime. 1-2 tablets as needed for sleep.   No facility-administered encounter medications on file as of 07/06/2018.     Surgical History: Past  Surgical History:  Procedure Laterality Date  . [place] STUDY N/A 04/24/2016   Procedure: 57 HOUR PH STUDY;  Surgeon: Lucilla Lame, MD;  Location: [facility] ENDOSCOPY;  Service: Endoscopy;  Laterality: N/A;  . ABDOMINAL HYSTERECTOMY     Total  . APPENDECTOMY    . CARPOMETACARPAL ([place]) FUSION OF THUMB Left 05/16/2016   Procedure: CARPOMETACARPAL [facility]) FUSION OF THUMB;  Surgeon: Hessie Knows, MD;  Location: [facility] ORS;  Service: Orthopedics;  Laterality: Left;  . CATARACT EXTRACTION Bilateral 12/2012  . CHOLECYSTECTOMY    . COLONOSCOPY  2003  . ESOPHAGEAL MANOMETRY N/A 04/24/2016    Procedure: ESOPHAGEAL MANOMETRY (EM);  Surgeon: Lucilla Lame, MD;  Location: [facility] ENDOSCOPY;  Service: Endoscopy;  Laterality: N/A;  . ESOPHAGOGASTRODUODENOSCOPY  08/2011  . FOOT SURGERY    . HALLUX VALGUS CORRECTION    . HIP SURGERY Right 1950   tendon release r/t polio  . KNEE ARTHROSCOPY Left 06/23/2008  . RETINAL DETACHMENT SURGERY Right 03/2014  . ROTATOR CUFF REPAIR Bilateral   . URINARY SURGERY  2014   [place]    Medical History: Past Medical History:  Diagnosis Date  . Anxiety   . Asthma 2015   mild, seasonal allergy triggered.  . Cancer ([place]) 2013   skin cancer  on left hand  . Depression   . Esophageal dysmotility   . GERD (gastroesophageal reflux disease) 11/05/2012  . History of hiatal hernia   . Hyperlipidemia   . Hypertension   . Incontinence of urine   . Lung nodule   . OA (osteoarthritis)   . Obesity   . PONV (postoperative nausea and vomiting)   . Post-polio syndrome    contracted at 42 months old  . Pulmonary nodule 11/05/2012   RML  . Shingles 2009    Family History: Family History  Problem Relation Age of Onset  . Heart disease Mother   . Heart disease Father   . Heart disease Brother   . Stroke Maternal Grandmother   . Heart disease Maternal Grandfather   . Heart attack Brother     Social History: Social History   Socioeconomic History  . Marital status: Married    Spouse name: Not on file  . Number of children: Not on file  . Years of education: Not on file  . Highest education level: Not on file  Occupational History  . Not on file  Social Needs  . Financial resource strain: Not on file  . Food insecurity    Worry: Not on file    Inability: Not on file  . Transportation needs    Medical: Not on file    Non-medical: Not on file  Tobacco Use  . Smoking status: Former Smoker    Quit date: 05/09/1968    Years since quitting: 50.1  . Smokeless tobacco: Never Used  Substance and Sexual Activity  . Alcohol use: No  . Drug  use: No  . Sexual activity: Not on file  Lifestyle  . Physical activity    Days per week: Not on file    Minutes per session: Not on file  . Stress: Not on file  Relationships  . Social Herbalist on phone: Not on file    Gets together: Not on file    Attends religious service: Not on file    Active member of club or organization: Not on file    Attends meetings of clubs or organizations: Not on file    Relationship  status: Not on file  . Intimate partner violence    Fear of current or ex partner: Not on file    Emotionally abused: Not on file    Physically abused: Not on file    Forced sexual activity: Not on file  Other Topics Concern  . Not on file  Social History Narrative  . Not on file    Vital Signs: Blood pressure (!) 142/78, pulse 70, resp. rate 16, height 5\' 2"  (1.575 m), weight 169 lb (76.7 kg), SpO2 98 %.  Examination: General Appearance: The patient is well-developed, well-nourished, and in no distress. Skin: Gross inspection of skin unremarkable. Head: normocephalic, no gross deformities. Eyes: no gross deformities noted. ENT: ears appear grossly normal no exudates. Neck: Supple. No thyromegaly. No LAD. Respiratory: clear bilateraly. Cardiovascular: Normal S1 and S2 without murmur or rub. Extremities: No cyanosis. pulses are equal. Neurologic: Alert and oriented. No involuntary movements.  LABS: No results found for this or any previous visit (from the past 2160 hour(s)).  Radiology: Nm Myocar Multi W/spect W/wall Motion / Ef  Result Date: 08/25/2017  The study is normal.  This is a low risk study.  The left ventricular ejection fraction is normal (55-65%).  There was no ST segment deviation noted during stress.  Negative lexiscan stress LV appears well preserved. No evidence of ischemia. Low risk study.   Dg Chest Port 1 View  Result Date: 08/24/2017 CLINICAL DATA:  Acute onset of generalized chest pain. EXAM: PORTABLE CHEST 1 VIEW  COMPARISON:  Chest radiograph performed 02/24/2009 FINDINGS: The lungs are well-aerated and clear. There is no evidence of focal opacification, pleural effusion or pneumothorax. The cardiomediastinal silhouette is within normal limits. No acute osseous abnormalities are seen. IMPRESSION: No acute cardiopulmonary process seen. Electronically Signed   By: Garald Balding M.D.   On: 08/24/2017 23:30    No results found.  No results found.    Assessment and Plan: Patient Active Problem List   Diagnosis Date Noted  . Other fatigue 02/16/2018  . Vertigo 02/08/2018  . Screening for breast cancer 12/17/2017  . Flu vaccine need 10/30/2017  . Need for vaccination against Streptococcus pneumoniae using pneumococcal conjugate vaccine 13 10/30/2017  . Hypokalemia 08/28/2017  . Absolute anemia 08/28/2017  . Benign essential tremor 08/28/2017  . Chest pain 08/25/2017  . Depression 03/23/2017  . Hiatal hernia 04/11/2016  . Anxiety   . OA (osteoarthritis)   . Hyperlipidemia   . Essential hypertension   . Post-polio syndrome   . Lung nodule   . Esophageal dysmotility   . Incontinence of urine   . Obesity   . GERD (gastroesophageal reflux disease) 11/05/2012  . Pulmonary nodule 11/05/2012  . Shingles 01/22/2007    1. OSA on CPAP Continue to use cpap as discussed.    2. Gastroesophageal reflux disease without esophagitis Stable continue to use medication as directed.   3. Essential hypertension Slightly elevated today, 142/78, she reports taking her bp meds today.  Will continue to monitor.  Encouraged patient to monitor at home and call pcp if stays elevated.   General Counseling: I have discussed the findings of the evaluation and examination with [facility].  I have also discussed any further diagnostic evaluation thatmay be needed or ordered today. [place] verbalizes understanding of the findings of todays visit. We also reviewed her medications today and discussed drug interactions and side  effects including but not limited excessive drowsiness and altered mental states. We also discussed that there is always  a risk not just to her but also people around her. she has been encouraged to call the office with any questions or concerns that should arise related to todays visit.    Time spent:  20 This patient was seen by Orson Gear AGNP-C in Collaboration with Dr. Devona Konig as a part of collaborative care agreement.   I have personally obtained a history, examined the patient, evaluated laboratory and imaging results, formulated the assessment and plan and placed orders.    [name], MD [facility] Pulmonary and Critical Care Sleep medicine

## 2018-07-10 ENCOUNTER — Other Ambulatory Visit: Payer: Self-pay

## 2018-07-10 ENCOUNTER — Encounter: Payer: Self-pay | Admitting: Nurse Practitioner

## 2018-07-10 ENCOUNTER — Ambulatory Visit (INDEPENDENT_AMBULATORY_CARE_PROVIDER_SITE_OTHER): Payer: Medicare Other | Admitting: Nurse Practitioner

## 2018-07-10 VITALS — BP 134/78 | HR 72 | Resp 16 | Ht 63.0 in | Wt 169.0 lb

## 2018-07-10 DIAGNOSIS — G250 Essential tremor: Secondary | ICD-10-CM | POA: Diagnosis not present

## 2018-07-10 DIAGNOSIS — I10 Essential (primary) hypertension: Secondary | ICD-10-CM

## 2018-07-10 DIAGNOSIS — R42 Dizziness and giddiness: Secondary | ICD-10-CM | POA: Diagnosis not present

## 2018-07-10 DIAGNOSIS — R05 Cough: Secondary | ICD-10-CM | POA: Diagnosis not present

## 2018-07-10 DIAGNOSIS — R058 Other specified cough: Secondary | ICD-10-CM

## 2018-07-10 DIAGNOSIS — T464X5A Adverse effect of angiotensin-converting-enzyme inhibitors, initial encounter: Secondary | ICD-10-CM

## 2018-07-10 MED ORDER — AMLODIPINE BESYLATE 2.5 MG PO TABS: 2.5000 mg | ORAL_TABLET | Freq: Every day | ORAL | 3 refills | Status: DC

## 2018-07-10 NOTE — Progress Notes (Signed)
[facility] [place],  [address]  Internal MEDICINE  Office Visit Note  Patient Name: Susan Shah  [id number]  [id number]  Date of Service: 07/10/2018  Chief Complaint  Patient presents with  . Hypertension  . Cough    The patient is here for follow up visit. Blood pressure is well controlled. She states that she has gradually developed a dry and hacky cough. Has been going on for a few months. Gradually worsening. She is currently taking both lisinopril 10mg  and losartan/HCTZ 100/25mg  tablets daily. This is likely cause of cough. She states that she feels well. Will d/c lisinopril 10mg . Add amlodipine 2.5mg  daily. Patient to monitor her blood pressures at home closely. Will adjust as needed. She has no other concerns. She states that her vertigo symptoms are slowly subsiding.       Current Medication: Outpatient Encounter Medications as of 07/10/2018  Medication Sig Note  . aspirin EC 81 MG tablet Take 81 mg by mouth at bedtime.    Marland Kitchen buPROPion (WELLBUTRIN XL) 150 MG 24 hr tablet Take 1 tablet (150 mg total) by mouth every morning.   . cetirizine (ZYRTEC) 10 MG tablet Take 10 mg by mouth daily.   . Cholecalciferol (VITAMIN D) 2000 units tablet Take 2,000 Units by mouth daily.   . cyanocobalamin 500 MCG tablet Take 500 mcg by mouth daily.   . diclofenac (VOLTAREN) 50 MG EC tablet Take 50 mg by mouth 2 (two) times daily as needed for mild pain.   Marland Kitchen escitalopram (LEXAPRO) 10 MG tablet TAKE ONE TABLET BY MOUTH EVERY DAY AT 5-7 PM   . famotidine (PEPCID) 20 MG tablet Take 1 tablet (20 mg total) by mouth 2 (two) times daily.   Marland Kitchen loratadine (QC LORATADINE ALLERGY RELIEF) 10 MG tablet Take 1 tablet (10 mg total) by mouth daily.   Marland Kitchen losartan-hydrochlorothiazide (HYZAAR) 100-25 MG tablet TAKE ONE TABLET BY MOUTH EVERY DAY   . meclizine (ANTIVERT) 25 MG tablet Take 1 tab po 2 to 3 times a daily as need   . metoCLOPramide (REGLAN) 5 MG tablet TAKE ONE TABLET  BY MOUTH TWICE DAILY BEFORE MEALS AND ONE AT BEDTIME   . metoprolol tartrate (LOPRESSOR) 25 MG tablet Take 1 tablet (25 mg total) by mouth 2 (two) times daily with a meal.   . NON FORMULARY cpap device   . oxybutynin (DITROPAN-XL) 10 MG 24 hr tablet Take 2 tablets (20 mg total) by mouth daily.   . pantoprazole (PROTONIX) 40 MG tablet Take 1 tablet (40 mg total) by mouth 2 (two) times daily.   . potassium chloride SA (K-DUR) 20 MEQ tablet Take 1 tablet (20 mEq total) by mouth daily.   . predniSONE (DELTASONE) 5 MG tablet Four tabs po day 1; 3 tabs po day2; 2 tabs po day3; 1 tab po day4   . ranitidine (ZANTAC) 150 MG tablet Take 1 tablet (150 mg total) by mouth daily as needed for heartburn.   Marland Kitchen rOPINIRole (REQUIP) 0.5 MG tablet Take 1 tablet po BID when needed   . simvastatin (ZOCOR) 20 MG tablet Take 1 tablet (20 mg total) by mouth daily at 6 PM.   . traZODone (DESYREL) 50 MG tablet Take 50 mg by mouth at bedtime. 1-2 tablets as needed for sleep.   . [DISCONTINUED] lisinopril (ZESTRIL) 5 MG tablet Take 2 tablets (10 mg total) by mouth daily. 07/10/2018: causing cough  . amLODipine (NORVASC) 2.5 MG tablet Take 1 tablet (2.5 mg  total) by mouth daily.    No facility-administered encounter medications on file as of 07/10/2018.     Surgical History: Past Surgical History:  Procedure Laterality Date  . [place] STUDY N/A 04/24/2016   Procedure: 63 HOUR PH STUDY;  Surgeon: Lucilla Lame, MD;  Location: [facility] ENDOSCOPY;  Service: Endoscopy;  Laterality: N/A;  . ABDOMINAL HYSTERECTOMY     Total  . APPENDECTOMY    . CARPOMETACARPAL ([place]) FUSION OF THUMB Left 05/16/2016   Procedure: CARPOMETACARPAL [place]) FUSION OF THUMB;  Surgeon: Hessie Knows, MD;  Location: [facility] ORS;  Service: Orthopedics;  Laterality: Left;  . CATARACT EXTRACTION Bilateral 12/2012  . CHOLECYSTECTOMY    . COLONOSCOPY  2003  . ESOPHAGEAL MANOMETRY N/A 04/24/2016   Procedure: ESOPHAGEAL MANOMETRY (EM);  Surgeon: Lucilla Lame, MD;   Location: [facility] ENDOSCOPY;  Service: Endoscopy;  Laterality: N/A;  . ESOPHAGOGASTRODUODENOSCOPY  08/2011  . FOOT SURGERY    . HALLUX VALGUS CORRECTION    . HIP SURGERY Right 1950   tendon release r/t polio  . KNEE ARTHROSCOPY Left 06/23/2008  . RETINAL DETACHMENT SURGERY Right 03/2014  . ROTATOR CUFF REPAIR Bilateral   . URINARY SURGERY  2014   [place]    Medical History: Past Medical History:  Diagnosis Date  . Anxiety   . Asthma 2015   mild, seasonal allergy triggered.  . Cancer ([place]) 2013   skin cancer  on left hand  . Depression   . Esophageal dysmotility   . GERD (gastroesophageal reflux disease) 11/05/2012  . History of hiatal hernia   . Hyperlipidemia   . Hypertension   . Incontinence of urine   . Lung nodule   . OA (osteoarthritis)   . Obesity   . PONV (postoperative nausea and vomiting)   . Post-polio syndrome    contracted at 27 months old  . Pulmonary nodule 11/05/2012   RML  . Shingles 2009    Family History: Family History  Problem Relation Age of Onset  . Heart disease Mother   . Heart disease Father   . Heart disease Brother   . Stroke Maternal Grandmother   . Heart disease Maternal Grandfather   . Heart attack Brother     Social History   Socioeconomic History  . Marital status: Married    Spouse name: Not on file  . Number of children: Not on file  . Years of education: Not on file  . Highest education level: Not on file  Occupational History  . Not on file  Social Needs  . Financial resource strain: Not on file  . Food insecurity    Worry: Not on file    Inability: Not on file  . Transportation needs    Medical: Not on file    Non-medical: Not on file  Tobacco Use  . Smoking status: Former Smoker    Quit date: 05/09/1968    Years since quitting: 50.2  . Smokeless tobacco: Never Used  Substance and Sexual Activity  . Alcohol use: No  . Drug use: No  . Sexual activity: Not on file  Lifestyle  . Physical activity    Days  per week: Not on file    Minutes per session: Not on file  . Stress: Not on file  Relationships  . Social Herbalist on phone: Not on file    Gets together: Not on file    Attends religious service: Not on file    Active member of club  or organization: Not on file    Attends meetings of clubs or organizations: Not on file    Relationship status: Not on file  . Intimate partner violence    Fear of current or ex partner: Not on file    Emotionally abused: Not on file    Physically abused: Not on file    Forced sexual activity: Not on file  Other Topics Concern  . Not on file  Social History Narrative  . Not on file      Review of Systems  Constitutional: Negative for appetite change, chills, fatigue, fever and unexpected weight change.  HENT: Negative for congestion, ear discharge, ear pain, facial swelling, rhinorrhea, sinus pressure, sinus pain, sore throat, trouble swallowing and voice change.   Respiratory: Positive for cough. Negative for choking, chest tightness, shortness of breath and wheezing.   Cardiovascular: Negative for chest pain and palpitations.  Gastrointestinal: Negative for abdominal distention, abdominal pain, blood in stool, constipation, diarrhea, nausea and vomiting.  Endocrine: Negative for cold intolerance, heat intolerance, polydipsia and polyuria.  Musculoskeletal: Positive for gait problem. Negative for arthralgias, back pain, neck pain and neck stiffness.  Skin: Negative for color change, pallor, rash and wound.  Allergic/Immunologic: Positive for environmental allergies.  Neurological: Positive for dizziness, tremors, weakness, light-headedness, numbness and headaches. Negative for syncope and facial asymmetry.       Symptoms are beginning to resolve.   Hematological: Negative for adenopathy.  Psychiatric/Behavioral: Positive for sleep disturbance.    Today's Vitals   07/10/18 0946  BP: 134/78  Pulse: 72  Resp: 16  SpO2: 93%  Weight:  169 lb (76.7 kg)  Height: 5\' 3"  (1.6 m)   Body mass index is 29.94 kg/m.  Physical Exam Vitals signs and nursing note reviewed.  Constitutional:      General: She is not in acute distress.    Appearance: Normal appearance. She is well-developed. She is not diaphoretic.  HENT:     Head: Normocephalic and atraumatic.     Mouth/Throat:     Pharynx: No oropharyngeal exudate.  Eyes:     General: No scleral icterus.       Right eye: No discharge.        Left eye: No discharge.     Conjunctiva/sclera: Conjunctivae normal.     Pupils: Pupils are equal, round, and reactive to light.  Neck:     Musculoskeletal: Normal range of motion and neck supple.     Thyroid: No thyromegaly.     Vascular: No JVD.     Trachea: No tracheal deviation.  Cardiovascular:     Rate and Rhythm: Normal rate and regular rhythm.     Heart sounds: Normal heart sounds. No murmur. No friction rub. No gallop.   Pulmonary:     Effort: Pulmonary effort is normal. No respiratory distress.     Breath sounds: Normal breath sounds. No wheezing or rales.  Chest:     Chest wall: No mass or tenderness.     Breasts: Breasts are symmetrical.        Right: No inverted nipple, mass, nipple discharge, skin change or tenderness.        Left: No inverted nipple, mass, nipple discharge, skin change or tenderness.  Abdominal:     General: Bowel sounds are normal. There is no distension.     Palpations: Abdomen is soft.     Tenderness: There is no abdominal tenderness. There is no guarding.  Musculoskeletal: Normal range of motion.  General: Deformity present. No tenderness.     Comments: Right leg due to post polio  Lymphadenopathy:     Cervical: No cervical adenopathy.  Skin:    General: Skin is warm and dry.     Capillary Refill: Capillary refill takes less than 2 seconds.     Findings: No erythema or rash.  Neurological:     Mental Status: She is alert and oriented to person, place, and time. Mental status is at  baseline.     Comments: Mild, fine tremor present in bilateral upper extremities.   Psychiatric:        Behavior: Behavior normal.        Thought Content: Thought content normal.        Judgment: Judgment normal.    Assessment/Plan: 1. Cough due to ACE inhibitor D/c lisinopril 10mg . Continue losartan/HCTZ as before.   2. Essential hypertension Add amlodipine 2.5mg  every day. Continue other medications as prescribed. Monitor BP closely and adjust meds as indicated.  - amLODipine (NORVASC) 2.5 MG tablet; Take 1 tablet (2.5 mg total) by mouth daily.  Dispense: 30 tablet; Refill: 3  3. Vertigo Improving. Continue to monitor.   4. Benign essential tremor Stable.   General Counseling: Burnis verbalizes understanding of the findings of todays visit and agrees with plan of treatment. I have discussed any further diagnostic evaluation that may be needed or ordered today. We also reviewed her medications today. she has been encouraged to call the office with any questions or concerns that should arise related to todays visit.   Hypertension Counseling:   The following hypertensive lifestyle modification were recommended and discussed:  1. Limiting alcohol intake to less than 1 oz/day of ethanol:(24 oz of beer or 8 oz of wine or 2 oz of 100-proof whiskey). 2. Take baby ASA 81 mg daily. 3. Importance of regular aerobic exercise and losing weight. 4. Reduce dietary saturated fat and cholesterol intake for overall cardiovascular health. 5. Maintaining adequate dietary potassium, calcium, and magnesium intake. 6. Regular monitoring of the blood pressure. 7. Reduce sodium intake to less than 100 mmol/day (less than 2.3 gm of sodium or less than 6 gm of sodium choride)   This patient was seen by Starr with Dr Lavera Guise as a part of collaborative care agreement  Meds ordered this encounter  Medications  . amLODipine (NORVASC) 2.5 MG tablet    Sig: Take 1 tablet (2.5  mg total) by mouth daily.    Dispense:  30 tablet    Refill:  3    Please d/c lisinopril.    Order Specific Question:   Supervising Provider    Answer:   Lavera Guise [[id number]]    Time spent: 49 Minutes      Dr Lavera Guise Internal medicine

## 2018-07-15 ENCOUNTER — Other Ambulatory Visit: Payer: Self-pay

## 2018-07-15 MED ORDER — METOPROLOL TARTRATE 25 MG PO TABS: 25.0000 mg | ORAL_TABLET | Freq: Two times a day (BID) | ORAL | 3 refills | Status: DC

## 2018-07-16 ENCOUNTER — Other Ambulatory Visit: Payer: Self-pay | Admitting: Nurse Practitioner

## 2018-07-16 ENCOUNTER — Other Ambulatory Visit: Payer: Self-pay

## 2018-07-16 MED ORDER — ESCITALOPRAM OXALATE 10 MG PO TABS: ORAL_TABLET | ORAL | 1 refills | Status: DC

## 2018-07-16 MED ORDER — SIMVASTATIN 20 MG PO TABS: 20.0000 mg | ORAL_TABLET | Freq: Every day | ORAL | 1 refills | Status: DC

## 2018-07-22 ENCOUNTER — Other Ambulatory Visit: Payer: Self-pay

## 2018-07-22 DIAGNOSIS — G250 Essential tremor: Secondary | ICD-10-CM

## 2018-07-22 MED ORDER — ROPINIROLE HCL 0.5 MG PO TABS: ORAL_TABLET | ORAL | 2 refills | Status: DC

## 2018-07-29 DIAGNOSIS — R4189 Other symptoms and signs involving cognitive functions and awareness: Secondary | ICD-10-CM | POA: Diagnosis not present

## 2018-07-29 DIAGNOSIS — G8311 Monoplegia of lower limb affecting right dominant side: Secondary | ICD-10-CM | POA: Diagnosis not present

## 2018-07-29 DIAGNOSIS — K219 Gastro-esophageal reflux disease without esophagitis: Secondary | ICD-10-CM | POA: Diagnosis not present

## 2018-07-29 DIAGNOSIS — R42 Dizziness and giddiness: Secondary | ICD-10-CM | POA: Diagnosis not present

## 2018-08-04 ENCOUNTER — Other Ambulatory Visit: Payer: Self-pay

## 2018-08-05 ENCOUNTER — Other Ambulatory Visit: Payer: Self-pay | Admitting: Nurse Practitioner

## 2018-08-05 MED ORDER — OXYBUTYNIN CHLORIDE ER 10 MG PO TB24: 20.0000 mg | ORAL_TABLET | Freq: Every day | ORAL | 3 refills | Status: DC

## 2018-09-07 ENCOUNTER — Other Ambulatory Visit: Payer: Self-pay | Admitting: Nurse Practitioner

## 2018-09-07 MED ORDER — BUPROPION HCL ER (XL) 150 MG PO TB24: 150.0000 mg | ORAL_TABLET | ORAL | 1 refills | Status: DC

## 2018-09-23 DIAGNOSIS — Z23 Encounter for immunization: Secondary | ICD-10-CM | POA: Diagnosis not present

## 2018-10-05 ENCOUNTER — Other Ambulatory Visit: Payer: Self-pay

## 2018-10-05 DIAGNOSIS — I10 Essential (primary) hypertension: Secondary | ICD-10-CM

## 2018-10-05 MED ORDER — AMLODIPINE BESYLATE 2.5 MG PO TABS: 2.5000 mg | ORAL_TABLET | Freq: Every day | ORAL | 3 refills | Status: DC

## 2018-10-05 MED ORDER — LORATADINE 10 MG PO TABS: 10.0000 mg | ORAL_TABLET | Freq: Every day | ORAL | 3 refills | Status: DC

## 2018-10-07 ENCOUNTER — Other Ambulatory Visit: Payer: Self-pay

## 2018-10-07 MED ORDER — METOPROLOL TARTRATE 25 MG PO TABS: 25.0000 mg | ORAL_TABLET | Freq: Two times a day (BID) | ORAL | 3 refills | Status: DC

## 2018-10-13 ENCOUNTER — Other Ambulatory Visit: Payer: Self-pay

## 2018-10-13 ENCOUNTER — Ambulatory Visit (INDEPENDENT_AMBULATORY_CARE_PROVIDER_SITE_OTHER): Payer: Medicare Other | Admitting: Nurse Practitioner

## 2018-10-13 ENCOUNTER — Encounter: Payer: Self-pay | Admitting: Nurse Practitioner

## 2018-10-13 VITALS — BP 112/59 | HR 71 | Temp 98.3°F | Resp 16 | Ht 63.0 in | Wt 171.0 lb

## 2018-10-13 DIAGNOSIS — I10 Essential (primary) hypertension: Secondary | ICD-10-CM | POA: Diagnosis not present

## 2018-10-13 DIAGNOSIS — G250 Essential tremor: Secondary | ICD-10-CM

## 2018-10-13 DIAGNOSIS — R42 Dizziness and giddiness: Secondary | ICD-10-CM

## 2018-10-13 NOTE — Progress Notes (Signed)
[facility] [place], [place] [address]  Internal MEDICINE  Office Visit Note  Patient Name: HERMIA CLOHESSY  [id number]  [id number]  Date of Service: 10/13/2018  Chief Complaint  Patient presents with  . Hypertension  . Hyperlipidemia  . Gastroesophageal Reflux    The patient is here for routine follow up. Patient's blood pressure is doing very well with amlodipine at low dose and losartan/HCTZ. Continues to have some dizziness when changing positions. Did see new ENT provider who did not find explanation for her symptoms. She denies new problems or concerns today.       Current Medication: Outpatient Encounter Medications as of 10/13/2018  Medication Sig  . amLODipine (NORVASC) 2.5 MG tablet Take 1 tablet (2.5 mg total) by mouth daily.  Marland Kitchen aspirin EC 81 MG tablet Take 81 mg by mouth at bedtime.   Marland Kitchen buPROPion (WELLBUTRIN XL) 150 MG 24 hr tablet Take 1 tablet (150 mg total) by mouth every morning.  . Cholecalciferol (VITAMIN D) 2000 units tablet Take 2,000 Units by mouth daily.  . cyanocobalamin 500 MCG tablet Take 500 mcg by mouth daily.  Marland Kitchen escitalopram (LEXAPRO) 10 MG tablet TAKE ONE TABLET BY MOUTH EVERY DAY AT 5-7 PM  . famotidine (PEPCID) 20 MG tablet Take 1 tablet (20 mg total) by mouth 2 (two) times daily.  Marland Kitchen loratadine (QC LORATADINE ALLERGY RELIEF) 10 MG tablet Take 1 tablet (10 mg total) by mouth daily.  Marland Kitchen losartan-hydrochlorothiazide (HYZAAR) 100-25 MG tablet TAKE ONE TABLET BY MOUTH EVERY DAY  . meclizine (ANTIVERT) 25 MG tablet Take 1 tab po 2 to 3 times a daily as need  . metoCLOPramide (REGLAN) 5 MG tablet TAKE ONE TABLET BY MOUTH TWICE DAILY BEFORE MEALS AND ONE AT BEDTIME  . metoprolol tartrate (LOPRESSOR) 25 MG tablet Take 1 tablet (25 mg total) by mouth 2 (two) times daily with a meal.  . NON FORMULARY cpap device  . oxybutynin (DITROPAN-XL) 10 MG 24 hr tablet Take 2 tablets (20 mg total) by mouth daily.  . pantoprazole (PROTONIX) 40 MG  tablet Take 1 tablet (40 mg total) by mouth 2 (two) times daily.  . potassium chloride SA (K-DUR) 20 MEQ tablet Take 1 tablet (20 mEq total) by mouth daily.  . ranitidine (ZANTAC) 150 MG tablet Take 1 tablet (150 mg total) by mouth daily as needed for heartburn.  Marland Kitchen rOPINIRole (REQUIP) 0.5 MG tablet Take 1 tablet po BID when needed  . simvastatin (ZOCOR) 20 MG tablet Take 1 tablet (20 mg total) by mouth daily at 6 PM.  . traZODone (DESYREL) 50 MG tablet Take 50 mg by mouth at bedtime. 1-2 tablets as needed for sleep.  . [DISCONTINUED] cetirizine (ZYRTEC) 10 MG tablet Take 10 mg by mouth daily.  . [DISCONTINUED] diclofenac (VOLTAREN) 50 MG EC tablet Take 50 mg by mouth 2 (two) times daily as needed for mild pain.  . [DISCONTINUED] predniSONE (DELTASONE) 5 MG tablet Four tabs po day 1; 3 tabs po day2; 2 tabs po day3; 1 tab po day4 (Patient not taking: Reported on 10/13/2018)   No facility-administered encounter medications on file as of 10/13/2018.     Surgical History: Past Surgical History:  Procedure Laterality Date  . [place] STUDY N/A 04/24/2016   Procedure: 46 HOUR PH STUDY;  Surgeon: Lucilla Lame, MD;  Location: [facility] ENDOSCOPY;  Service: Endoscopy;  Laterality: N/A;  . ABDOMINAL HYSTERECTOMY     Total  . APPENDECTOMY    .  CARPOMETACARPAL ([place]) FUSION OF THUMB Left 05/16/2016   Procedure: CARPOMETACARPAL [facility]) FUSION OF THUMB;  Surgeon: Hessie Knows, MD;  Location: [facility] ORS;  Service: Orthopedics;  Laterality: Left;  . CATARACT EXTRACTION Bilateral 12/2012  . CHOLECYSTECTOMY    . COLONOSCOPY  2003  . ESOPHAGEAL MANOMETRY N/A 04/24/2016   Procedure: ESOPHAGEAL MANOMETRY (EM);  Surgeon: Lucilla Lame, MD;  Location: [facility] ENDOSCOPY;  Service: Endoscopy;  Laterality: N/A;  . ESOPHAGOGASTRODUODENOSCOPY  08/2011  . FOOT SURGERY    . HALLUX VALGUS CORRECTION    . HIP SURGERY Right 1950   tendon release r/t polio  . KNEE ARTHROSCOPY Left 06/23/2008  . RETINAL DETACHMENT SURGERY Right 03/2014   . ROTATOR CUFF REPAIR Bilateral   . URINARY SURGERY  2014   [place]    Medical History: Past Medical History:  Diagnosis Date  . Anxiety   . Asthma 2015   mild, seasonal allergy triggered.  . Cancer ([place]) 2013   skin cancer  on left hand  . Depression   . Esophageal dysmotility   . GERD (gastroesophageal reflux disease) 11/05/2012  . History of hiatal hernia   . Hyperlipidemia   . Hypertension   . Incontinence of urine   . Lung nodule   . OA (osteoarthritis)   . Obesity   . PONV (postoperative nausea and vomiting)   . Post-polio syndrome    contracted at 41 months old  . Pulmonary nodule 11/05/2012   RML  . Shingles 2009    Family History: Family History  Problem Relation Age of Onset  . Heart disease Mother   . Heart disease Father   . Heart disease Brother   . Stroke Maternal Grandmother   . Heart disease Maternal Grandfather   . Heart attack Brother     Social History   Socioeconomic History  . Marital status: Married    Spouse name: Not on file  . Number of children: Not on file  . Years of education: Not on file  . Highest education level: Not on file  Occupational History  . Not on file  Social Needs  . Financial resource strain: Not on file  . Food insecurity    Worry: Not on file    Inability: Not on file  . Transportation needs    Medical: Not on file    Non-medical: Not on file  Tobacco Use  . Smoking status: Former Smoker    Quit date: 05/09/1968    Years since quitting: 50.4  . Smokeless tobacco: Never Used  Substance and Sexual Activity  . Alcohol use: No  . Drug use: No  . Sexual activity: Not on file  Lifestyle  . Physical activity    Days per week: Not on file    Minutes per session: Not on file  . Stress: Not on file  Relationships  . Social Herbalist on phone: Not on file    Gets together: Not on file    Attends religious service: Not on file    Active member of club or organization: Not on file    Attends  meetings of clubs or organizations: Not on file    Relationship status: Not on file  . Intimate partner violence    Fear of current or ex partner: Not on file    Emotionally abused: Not on file    Physically abused: Not on file    Forced sexual activity: Not on file  Other Topics Concern  . Not on  file  Social History Narrative  . Not on file      Review of Systems  Constitutional: Negative for appetite change, chills, fatigue, fever and unexpected weight change.  HENT: Negative for congestion, ear discharge, ear pain, facial swelling, rhinorrhea, sinus pressure, sinus pain, sore throat, trouble swallowing and voice change.   Respiratory: Negative for cough, choking, chest tightness, shortness of breath and wheezing.   Cardiovascular: Negative for chest pain and palpitations.       Blood pressure is well controlled.   Gastrointestinal: Negative for abdominal distention, abdominal pain, blood in stool, constipation, diarrhea, nausea and vomiting.  Endocrine: Negative for cold intolerance, heat intolerance, polydipsia and polyuria.  Musculoskeletal: Positive for gait problem. Negative for arthralgias, back pain, neck pain and neck stiffness.  Skin: Negative for color change, pallor, rash and wound.  Allergic/Immunologic: Positive for environmental allergies.  Neurological: Positive for dizziness, tremors, weakness, light-headedness, numbness and headaches. Negative for syncope and facial asymmetry.       Improved .  Hematological: Negative for adenopathy.  Psychiatric/Behavioral: Positive for sleep disturbance.    Today's Vitals   10/13/18 0944  BP: (!) 112/59  Pulse: 71  Resp: 16  Temp: 98.3 F (36.8 C)  SpO2: 96%  Weight: 171 lb (77.6 kg)  Height: 5\' 3"  (1.6 m)   Body mass index is 30.29 kg/m.  Physical Exam Vitals signs and nursing note reviewed.  Constitutional:      General: She is not in acute distress.    Appearance: Normal appearance. She is well-developed. She  is not diaphoretic.  HENT:     Head: Normocephalic and atraumatic.     Mouth/Throat:     Pharynx: No oropharyngeal exudate.  Eyes:     General: No scleral icterus.       Right eye: No discharge.        Left eye: No discharge.     Pupils: Pupils are equal, round, and reactive to light.  Neck:     Musculoskeletal: Normal range of motion and neck supple.     Thyroid: No thyromegaly.     Vascular: No JVD.     Trachea: No tracheal deviation.  Cardiovascular:     Rate and Rhythm: Normal rate and regular rhythm.     Heart sounds: Normal heart sounds. No murmur. No friction rub. No gallop.   Pulmonary:     Effort: Pulmonary effort is normal. No respiratory distress.     Breath sounds: Normal breath sounds. No wheezing or rales.  Chest:     Chest wall: No mass or tenderness.     Breasts: Breasts are symmetrical.        Right: No inverted nipple, mass, nipple discharge, skin change or tenderness.        Left: No inverted nipple, mass, nipple discharge, skin change or tenderness.  Abdominal:     Palpations: Abdomen is soft.  Musculoskeletal: Normal range of motion.        General: Deformity present. No tenderness.     Comments: Right leg due to post polio  Lymphadenopathy:     Cervical: No cervical adenopathy.  Skin:    General: Skin is warm and dry.     Capillary Refill: Capillary refill takes less than 2 seconds.     Findings: No erythema or rash.  Neurological:     Mental Status: She is alert and oriented to person, place, and time. Mental status is at baseline.     Comments: Mild, fine tremor present  in bilateral upper extremities.   Psychiatric:        Behavior: Behavior normal.        Thought Content: Thought content normal.        Judgment: Judgment normal.   Assessment/Plan: 1. Essential hypertension Improved and stable. Continue bp medication as prescribed   2. Vertigo Likely positional. Has improved. Recommendations for managing symptoms discussed. Both neurology and  ENT have found no psychological cause.   3. Benign essential tremor Stable.   General Counseling: Avaree verbalizes understanding of the findings of todays visit and agrees with plan of treatment. I have discussed any further diagnostic evaluation that may be needed or ordered today. We also reviewed her medications today. she has been encouraged to call the office with any questions or concerns that should arise related to todays visit.   Hypertension Counseling:   The following hypertensive lifestyle modification were recommended and discussed:  1. Limiting alcohol intake to less than 1 oz/day of ethanol:(24 oz of beer or 8 oz of wine or 2 oz of 100-proof whiskey). 2. Take baby ASA 81 mg daily. 3. Importance of regular aerobic exercise and losing weight. 4. Reduce dietary saturated fat and cholesterol intake for overall cardiovascular health. 5. Maintaining adequate dietary potassium, calcium, and magnesium intake. 6. Regular monitoring of the blood pressure. 7. Reduce sodium intake to less than 100 mmol/day (less than 2.3 gm of sodium or less than 6 gm of sodium choride)   This patient was seen by Ida with Dr Lavera Guise as a part of collaborative care agreement  Time spent: 59 Minutes      Dr Lavera Guise Internal medicine

## 2018-11-02 ENCOUNTER — Other Ambulatory Visit: Payer: Self-pay

## 2018-11-02 MED ORDER — OXYBUTYNIN CHLORIDE ER 10 MG PO TB24: 20.0000 mg | ORAL_TABLET | Freq: Every day | ORAL | 3 refills | Status: DC

## 2018-11-09 ENCOUNTER — Other Ambulatory Visit: Payer: Self-pay | Admitting: Nurse Practitioner

## 2018-11-09 DIAGNOSIS — G250 Essential tremor: Secondary | ICD-10-CM

## 2018-11-09 MED ORDER — ROPINIROLE HCL 0.5 MG PO TABS: ORAL_TABLET | ORAL | 2 refills | Status: DC

## 2018-12-15 ENCOUNTER — Telehealth: Payer: Self-pay

## 2018-12-15 NOTE — Telephone Encounter (Signed)
CONFIRMED AND SCREENED FOR 12-21-18 OV. °

## 2018-12-21 ENCOUNTER — Ambulatory Visit: Payer: Self-pay | Admitting: Nurse Practitioner

## 2018-12-21 ENCOUNTER — Telehealth: Payer: Self-pay

## 2018-12-21 NOTE — Telephone Encounter (Signed)
Patient not feeling well and rescheduled appt from 12/21/18.0 [facility]

## 2018-12-22 ENCOUNTER — Other Ambulatory Visit: Payer: Self-pay

## 2018-12-22 MED ORDER — LOSARTAN POTASSIUM-HCTZ 100-25 MG PO TABS: 1.0000 | ORAL_TABLET | Freq: Every day | ORAL | 1 refills | Status: DC

## 2018-12-22 MED ORDER — POTASSIUM CHLORIDE CRYS ER 20 MEQ PO TBCR: 20.0000 meq | EXTENDED_RELEASE_TABLET | Freq: Every day | ORAL | 1 refills | Status: DC

## 2018-12-23 ENCOUNTER — Telehealth: Payer: Self-pay

## 2018-12-23 NOTE — Telephone Encounter (Signed)
Called rescheduled appointment on 12/24/2018 to 12/27/2018. klh

## 2018-12-24 ENCOUNTER — Ambulatory Visit: Payer: Medicare Other | Admitting: Internal Medicine

## 2018-12-31 ENCOUNTER — Other Ambulatory Visit: Payer: Self-pay | Admitting: Nurse Practitioner

## 2018-12-31 DIAGNOSIS — I10 Essential (primary) hypertension: Secondary | ICD-10-CM | POA: Diagnosis not present

## 2018-12-31 DIAGNOSIS — Z0001 Encounter for general adult medical examination with abnormal findings: Secondary | ICD-10-CM | POA: Diagnosis not present

## 2018-12-31 DIAGNOSIS — E782 Mixed hyperlipidemia: Secondary | ICD-10-CM | POA: Diagnosis not present

## 2018-12-31 DIAGNOSIS — E559 Vitamin D deficiency, unspecified: Secondary | ICD-10-CM | POA: Diagnosis not present

## 2019-01-01 LAB — COMPREHENSIVE METABOLIC PANEL
ALT: 7 IU/L (ref 0–32)
AST: 14 IU/L (ref 0–40)
Albumin/Globulin Ratio: 1.7 (ref 1.2–2.2)
Albumin: 4.1 g/dL (ref 3.7–4.7)
Alkaline Phosphatase: 108 IU/L (ref 39–117)
BUN/Creatinine Ratio: 17 (ref 12–28)
BUN: 12 mg/dL (ref 8–27)
Bilirubin Total: 0.3 mg/dL (ref 0.0–1.2)
CO2: 28 mmol/L (ref 20–29)
Calcium: 9.1 mg/dL (ref 8.7–10.3)
Chloride: 97 mmol/L (ref 96–106)
Creatinine, Ser: 0.71 mg/dL (ref 0.57–1.00)
GFR calc Af Amer: 98 mL/min/{1.73_m2} (ref >59)
GFR calc non Af Amer: 85 mL/min/{1.73_m2} (ref >59)
Globulin, Total: 2.4 g/dL (ref 1.5–4.5)
Glucose: 85 mg/dL (ref 65–99)
Potassium: 3.5 mmol/L (ref 3.5–5.2)
Sodium: 137 mmol/L (ref 134–144)
Total Protein: 6.5 g/dL (ref 6.0–8.5)

## 2019-01-01 LAB — LIPID PANEL W/O CHOL/HDL RATIO
Cholesterol, Total: 163 mg/dL (ref 100–199)
HDL: 58 mg/dL (ref >39)
LDL Chol Calc (NIH): 83 mg/dL (ref 0–99)
Triglycerides: 128 mg/dL (ref 0–149)
VLDL Cholesterol Cal: 22 mg/dL (ref 5–40)

## 2019-01-01 LAB — CBC
Hematocrit: 37.7 % (ref 34.0–46.6)
Hemoglobin: 12.1 g/dL (ref 11.1–15.9)
MCH: 27.3 pg (ref 26.6–33.0)
MCHC: 32.1 g/dL (ref 31.5–35.7)
MCV: 85 fL (ref 79–97)
Platelets: 339 10*3/uL (ref 150–450)
RBC: 4.44 x10E6/uL (ref 3.77–5.28)
RDW: 12.8 % (ref 11.7–15.4)
WBC: 6.3 10*3/uL (ref 3.4–10.8)

## 2019-01-01 LAB — T3: T3, Total: 103 ng/dL (ref 71–180)

## 2019-01-01 LAB — TSH: TSH: 2.22 u[IU]/mL (ref 0.450–4.500)

## 2019-01-01 LAB — VITAMIN D 25 HYDROXY (VIT D DEFICIENCY, FRACTURES): Vit D, 25-Hydroxy: 28.9 ng/mL — ABNORMAL LOW (ref 30.0–100.0)

## 2019-01-01 LAB — T4, FREE: Free T4: 1.28 ng/dL (ref 0.82–1.77)

## 2019-01-02 NOTE — Progress Notes (Signed)
Labs good. Discuss with patient at visit 01/06/2019

## 2019-01-04 ENCOUNTER — Telehealth: Payer: Self-pay

## 2019-01-04 NOTE — Telephone Encounter (Signed)
Confirmed appointment with patient. klh °

## 2019-01-05 ENCOUNTER — Other Ambulatory Visit: Payer: Self-pay

## 2019-01-05 MED ORDER — METOPROLOL TARTRATE 25 MG PO TABS: 25.0000 mg | ORAL_TABLET | Freq: Two times a day (BID) | ORAL | 3 refills | Status: DC

## 2019-01-06 ENCOUNTER — Ambulatory Visit (INDEPENDENT_AMBULATORY_CARE_PROVIDER_SITE_OTHER): Payer: Medicare Other | Admitting: Adult Health

## 2019-01-06 ENCOUNTER — Encounter: Payer: Self-pay | Admitting: Adult Health

## 2019-01-06 ENCOUNTER — Other Ambulatory Visit: Payer: Self-pay

## 2019-01-06 VITALS — BP 162/84 | HR 72 | Temp 96.7°F | Resp 16 | Ht 63.0 in | Wt 176.0 lb

## 2019-01-06 DIAGNOSIS — F329 Major depressive disorder, single episode, unspecified: Secondary | ICD-10-CM

## 2019-01-06 DIAGNOSIS — Z1231 Encounter for screening mammogram for malignant neoplasm of breast: Secondary | ICD-10-CM

## 2019-01-06 DIAGNOSIS — E559 Vitamin D deficiency, unspecified: Secondary | ICD-10-CM

## 2019-01-06 DIAGNOSIS — F32A Depression, unspecified: Secondary | ICD-10-CM

## 2019-01-06 DIAGNOSIS — Z0001 Encounter for general adult medical examination with abnormal findings: Secondary | ICD-10-CM

## 2019-01-06 DIAGNOSIS — F419 Anxiety disorder, unspecified: Secondary | ICD-10-CM

## 2019-01-06 DIAGNOSIS — I10 Essential (primary) hypertension: Secondary | ICD-10-CM | POA: Diagnosis not present

## 2019-01-06 DIAGNOSIS — K219 Gastro-esophageal reflux disease without esophagitis: Secondary | ICD-10-CM | POA: Diagnosis not present

## 2019-01-06 DIAGNOSIS — Z9989 Dependence on other enabling machines and devices: Secondary | ICD-10-CM

## 2019-01-06 DIAGNOSIS — J4520 Mild intermittent asthma, uncomplicated: Secondary | ICD-10-CM

## 2019-01-06 DIAGNOSIS — G4733 Obstructive sleep apnea (adult) (pediatric): Secondary | ICD-10-CM | POA: Diagnosis not present

## 2019-01-06 DIAGNOSIS — R300 Dysuria: Secondary | ICD-10-CM

## 2019-01-06 MED ORDER — AMLODIPINE BESYLATE 2.5 MG PO TABS: 2.5000 mg | ORAL_TABLET | Freq: Every day | ORAL | 2 refills | Status: DC

## 2019-01-06 MED ORDER — AMLODIPINE BESYLATE 2.5 MG PO TABS: 2.5000 mg | ORAL_TABLET | Freq: Every day | ORAL | 3 refills | Status: DC

## 2019-01-06 NOTE — Progress Notes (Signed)
[facility] [place], [place] [address]  Internal MEDICINE  Office Visit Note  Patient Name: Susan Shah  [id number]  [id number]  Date of Service: 01/24/2019  Chief Complaint  Patient presents with  . Medical Management of Chronic Issues  . Annual Exam    medicare annual exam   . Hyperlipidemia  . Hypertension  . Depression  . Quality Metric Gaps    mammogram      HPI Pt is here for routine health maintenance examination. She is a well appearing 73 yo female.  She has a history of HTN, HLD, depression and allergies. She also had Polio as a child and subsequently ambulates with crutches. Overall she is doing well.  Her most recent labs are reviewed with her, we discussed vit d supplementation.  Her bp is slightly elevated today 162/84.  She reports taking her medications as directed. She feels that her depression is manageable on her current medication regimen.   She is in need of a mammogram to close her quality metric gaps.    Current Medication: Outpatient Encounter Medications as of 01/06/2019  Medication Sig  . amLODipine (NORVASC) 2.5 MG tablet Take 1 tablet (2.5 mg total) by mouth daily.  Marland Kitchen aspirin EC 81 MG tablet Take 81 mg by mouth at bedtime.   Marland Kitchen buPROPion (WELLBUTRIN XL) 150 MG 24 hr tablet Take 1 tablet (150 mg total) by mouth every morning.  . Cholecalciferol (VITAMIN D) 2000 units tablet Take 2,000 Units by mouth daily.  . cyanocobalamin 500 MCG tablet Take 500 mcg by mouth daily.  . famotidine (PEPCID) 20 MG tablet Take 1 tablet (20 mg total) by mouth 2 (two) times daily.  Marland Kitchen losartan-hydrochlorothiazide (HYZAAR) 100-25 MG tablet Take 1 tablet by mouth daily.  . meclizine (ANTIVERT) 25 MG tablet Take 1 tab po 2 to 3 times a daily as need  . metoCLOPramide (REGLAN) 5 MG tablet TAKE ONE TABLET BY MOUTH TWICE DAILY BEFORE MEALS AND ONE AT BEDTIME  . metoprolol tartrate (LOPRESSOR) 25 MG tablet Take 1 tablet (25 mg total) by mouth 2 (two)  times daily with a meal.  . NON FORMULARY cpap device  . oxybutynin (DITROPAN-XL) 10 MG 24 hr tablet Take 2 tablets (20 mg total) by mouth daily.  . pantoprazole (PROTONIX) 40 MG tablet Take 1 tablet (40 mg total) by mouth 2 (two) times daily.  . potassium chloride SA (KLOR-CON) 20 MEQ tablet Take 1 tablet (20 mEq total) by mouth daily.  . ranitidine (ZANTAC) 150 MG tablet Take 1 tablet (150 mg total) by mouth daily as needed for heartburn.  Marland Kitchen rOPINIRole (REQUIP) 0.5 MG tablet Take 1 tablet po BID when needed  . simvastatin (ZOCOR) 20 MG tablet Take 1 tablet (20 mg total) by mouth daily at 6 PM.  . traZODone (DESYREL) 50 MG tablet Take 50 mg by mouth at bedtime. 1-2 tablets as needed for sleep.  . [DISCONTINUED] amLODipine (NORVASC) 2.5 MG tablet Take 1 tablet (2.5 mg total) by mouth daily.  . [DISCONTINUED] amLODipine (NORVASC) 2.5 MG tablet Take 1 tablet (2.5 mg total) by mouth daily.  . [DISCONTINUED] escitalopram (LEXAPRO) 10 MG tablet TAKE ONE TABLET BY MOUTH EVERY DAY AT 5-7 PM  . [DISCONTINUED] loratadine (QC LORATADINE ALLERGY RELIEF) 10 MG tablet Take 1 tablet (10 mg total) by mouth daily.   No facility-administered encounter medications on file as of 01/06/2019.    Surgical History: Past Surgical History:  Procedure Laterality Date  . 24  HOUR PH STUDY N/A 04/24/2016   Procedure: 86 HOUR PH STUDY;  Surgeon: Lucilla Lame, MD;  Location: [facility] ENDOSCOPY;  Service: Endoscopy;  Laterality: N/A;  . ABDOMINAL HYSTERECTOMY     Total  . APPENDECTOMY    . CARPOMETACARPAL ([place]) FUSION OF THUMB Left 05/16/2016   Procedure: CARPOMETACARPAL [facility]) FUSION OF THUMB;  Surgeon: Hessie Knows, MD;  Location: [facility] ORS;  Service: Orthopedics;  Laterality: Left;  . CATARACT EXTRACTION Bilateral 12/2012  . CHOLECYSTECTOMY    . COLONOSCOPY  2003  . ESOPHAGEAL MANOMETRY N/A 04/24/2016   Procedure: ESOPHAGEAL MANOMETRY (EM);  Surgeon: Lucilla Lame, MD;  Location: [facility] ENDOSCOPY;  Service: Endoscopy;   Laterality: N/A;  . ESOPHAGOGASTRODUODENOSCOPY  08/2011  . FOOT SURGERY    . HALLUX VALGUS CORRECTION    . HIP SURGERY Right 1950   tendon release r/t polio  . KNEE ARTHROSCOPY Left 06/23/2008  . RETINAL DETACHMENT SURGERY Right 03/2014  . ROTATOR CUFF REPAIR Bilateral   . URINARY SURGERY  2014   [place]    Medical History: Past Medical History:  Diagnosis Date  . Anxiety   . Asthma 2015   mild, seasonal allergy triggered.  . Cancer ([place]) 2013   skin cancer  on left hand  . Depression   . Esophageal dysmotility   . GERD (gastroesophageal reflux disease) 11/05/2012  . History of hiatal hernia   . Hyperlipidemia   . Hypertension   . Incontinence of urine   . Lung nodule   . OA (osteoarthritis)   . Obesity   . PONV (postoperative nausea and vomiting)   . Post-polio syndrome    contracted at 62 months old  . Pulmonary nodule 11/05/2012   RML  . Shingles 2009  . Sleep apnea     Family History: Family History  Problem Relation Age of Onset  . Heart disease Mother   . Heart disease Father   . Heart disease Brother   . Stroke Maternal Grandmother   . Heart disease Maternal Grandfather   . Heart attack Brother       Review of Systems  Constitutional: Negative for chills, fatigue and unexpected weight change.  HENT: Negative for congestion, rhinorrhea, sneezing and sore throat.   Eyes: Negative for photophobia, pain and redness.  Respiratory: Negative for cough, chest tightness and shortness of breath.   Cardiovascular: Negative for chest pain and palpitations.  Gastrointestinal: Negative for abdominal pain, constipation, diarrhea, nausea and vomiting.  Endocrine: Negative.   Genitourinary: Negative for dysuria and frequency.  Musculoskeletal: Negative for arthralgias, back pain, joint swelling and neck pain.  Skin: Negative for rash.  Allergic/Immunologic: Negative.   Neurological: Negative for tremors and numbness.  Hematological: Negative for adenopathy.  Does not bruise/bleed easily.  Psychiatric/Behavioral: Negative for behavioral problems and sleep disturbance. The patient is not nervous/anxious.      Vital Signs: BP (!) 162/84   Pulse 72   Temp (!) 96.7 F (35.9 C)   Resp 16   Ht 5\' 3"  (1.6 m)   Wt 176 lb (79.8 kg)   SpO2 96%   BMI 31.18 kg/m    Physical Exam Vitals and nursing note reviewed.  Constitutional:      General: She is not in acute distress.    Appearance: She is well-developed. She is not diaphoretic.  HENT:     Head: Normocephalic and atraumatic.     Mouth/Throat:     Pharynx: No oropharyngeal exudate.  Eyes:     Pupils: Pupils are equal,  round, and reactive to light.  Neck:     Thyroid: No thyromegaly.     Vascular: No JVD.     Trachea: No tracheal deviation.  Cardiovascular:     Rate and Rhythm: Normal rate and regular rhythm.     Heart sounds: Normal heart sounds. No murmur. No friction rub. No gallop.   Pulmonary:     Effort: Pulmonary effort is normal. No respiratory distress.     Breath sounds: Normal breath sounds. No wheezing or rales.  Chest:     Chest wall: No tenderness.  Abdominal:     Palpations: Abdomen is soft.     Tenderness: There is no abdominal tenderness. There is no guarding.  Musculoskeletal:        General: Normal range of motion.     Cervical back: Normal range of motion and neck supple.  Lymphadenopathy:     Cervical: No cervical adenopathy.  Skin:    General: Skin is warm and dry.  Neurological:     Mental Status: She is alert and oriented to person, place, and time.     Cranial Nerves: No cranial nerve deficit.  Psychiatric:        Behavior: Behavior normal.        Thought Content: Thought content normal.        Judgment: Judgment normal.      LABS: Recent Results (from the past 2160 hour(s))  Comprehensive metabolic panel     Status: None   Collection Time: 12/31/18 10:40 AM  Result Value Ref Range   Glucose 85 65 - 99 mg/dL   BUN 12 8 - 27 mg/dL    Creatinine, Ser 0.71 0.57 - 1.00 mg/dL   GFR calc non Af Amer 85 >59 mL/min/1.73   GFR calc Af Amer 98 >59 mL/min/1.73   BUN/Creatinine Ratio 17 12 - 28   Sodium 137 134 - 144 mmol/L   Potassium 3.5 3.5 - 5.2 mmol/L   Chloride 97 96 - 106 mmol/L   CO2 28 20 - 29 mmol/L   Calcium 9.1 8.7 - 10.3 mg/dL   Total Protein 6.5 6.0 - 8.5 g/dL   Albumin 4.1 3.7 - 4.7 g/dL   Globulin, Total 2.4 1.5 - 4.5 g/dL   Albumin/Globulin Ratio 1.7 1.2 - 2.2   Bilirubin Total 0.3 0.0 - 1.2 mg/dL   Alkaline Phosphatase 108 39 - 117 IU/L   AST 14 0 - 40 IU/L   ALT 7 0 - 32 IU/L  CBC     Status: None   Collection Time: 12/31/18 10:40 AM  Result Value Ref Range   WBC 6.3 3.4 - 10.8 x10E3/uL   RBC 4.44 3.77 - 5.28 x10E6/uL   Hemoglobin 12.1 11.1 - 15.9 g/dL   Hematocrit 37.7 34.0 - 46.6 %   MCV 85 79 - 97 fL   MCH 27.3 26.6 - 33.0 pg   MCHC 32.1 31.5 - 35.7 g/dL   RDW 12.8 11.7 - 15.4 %   Platelets 339 150 - 450 x10E3/uL  Lipid Panel w/o Chol/HDL Ratio     Status: None   Collection Time: 12/31/18 10:40 AM  Result Value Ref Range   Cholesterol, Total 163 100 - 199 mg/dL   Triglycerides 128 0 - 149 mg/dL   HDL 58 >39 mg/dL   VLDL Cholesterol Cal 22 5 - 40 mg/dL   LDL Chol Calc (NIH) 83 0 - 99 mg/dL  T4, free     Status: None   Collection Time:  12/31/18 10:40 AM  Result Value Ref Range   Free T4 1.28 0.82 - 1.77 ng/dL  TSH     Status: None   Collection Time: 12/31/18 10:40 AM  Result Value Ref Range   TSH 2.220 0.450 - 4.500 uIU/mL  VITAMIN D 25 Hydroxy (Vit-D Deficiency, Fractures)     Status: Abnormal   Collection Time: 12/31/18 10:40 AM  Result Value Ref Range   Vit D, 25-Hydroxy 28.9 (L) 30.0 - 100.0 ng/mL    Comment: Vitamin D deficiency has been defined by the [place] practice guideline as a level of serum 25-OH vitamin D less than 20 ng/mL (1,2). The Endocrine Society went on to further define vitamin D insufficiency as a level between 21 and 29  ng/mL (2). 1. IOM (Institute of Medicine). 2010. Dietary reference    intakes for calcium and D. [place]: The    [employer]. 2. Holick MF, Binkley [place], Bischoff-Ferrari HA, et al.    Evaluation, treatment, and prevention of vitamin D    deficiency: an Endocrine Society clinical practice    guideline. JCEM. 2011 Jul; 96(7):1911-30.   T3     Status: None   Collection Time: 12/31/18 10:40 AM  Result Value Ref Range   T3, Total 103 71 - 180 ng/dL     Assessment/Plan: 1. Encounter for general adult medical examination with abnormal findings Needs mammogram, otherwise up to date on PHM.  2. Essential hypertension Start Amlodipine 2.5mg  daily as directed.  - amLODipine (NORVASC) 2.5 MG tablet; Take 1 tablet (2.5 mg total) by mouth daily.  Dispense: 90 tablet; Refill: 2  3. Encounter for screening mammogram for malignant neoplasm of breast Mammogram ordered.   4. OSA on CPAP Controlled, continue cpap as prescribed.   5. Gastroesophageal reflux disease without esophagitis Stable, continue present management.   6. Mild intermittent asthma without complication Controlled, continue to use inhalers as directed.   7. Anxiety Controlled, continue present mgmt.  8. Depression, unspecified depression type Controlled.  9. Vitamin D deficiency Instructed patient to get OTC Vit D supplement, will continue to follow.  10. Dysuria - UA/M w/rflx Culture, Routine  General Counseling: Lundynn verbalizes understanding of the findings of todays visit and agrees with plan of treatment. I have discussed any further diagnostic evaluation that may be needed or ordered today. We also reviewed her medications today. she has been encouraged to call the office with any questions or concerns that should arise related to todays visit.   Orders Placed This Encounter  Procedures  . UA/M w/rflx Culture, Routine    Meds ordered this encounter  Medications  . DISCONTD: amLODipine  (NORVASC) 2.5 MG tablet    Sig: Take 1 tablet (2.5 mg total) by mouth daily.    Dispense:  30 tablet    Refill:  3    Please d/c lisinopril.  Marland Kitchen amLODipine (NORVASC) 2.5 MG tablet    Sig: Take 1 tablet (2.5 mg total) by mouth daily.    Dispense:  90 tablet    Refill:  2    Please d/c lisinopril.    Time spent: 30 Minutes   This patient was seen by Orson Gear AGNP-C in Collaboration with Dr Lavera Guise as a part of collaborative care agreement    Kendell Bane AGNP-C Internal Medicine

## 2019-01-07 ENCOUNTER — Other Ambulatory Visit: Payer: Self-pay | Admitting: Adult Health

## 2019-01-07 DIAGNOSIS — Z1231 Encounter for screening mammogram for malignant neoplasm of breast: Secondary | ICD-10-CM

## 2019-01-08 ENCOUNTER — Other Ambulatory Visit: Payer: Self-pay

## 2019-01-08 MED ORDER — ESCITALOPRAM OXALATE 10 MG PO TABS: ORAL_TABLET | ORAL | 1 refills | Status: DC

## 2019-01-08 MED ORDER — LORATADINE 10 MG PO TABS: 10.0000 mg | ORAL_TABLET | Freq: Every day | ORAL | 3 refills | Status: AC

## 2019-01-16 DIAGNOSIS — N3000 Acute cystitis without hematuria: Secondary | ICD-10-CM | POA: Diagnosis not present

## 2019-01-19 ENCOUNTER — Telehealth: Payer: Self-pay

## 2019-01-19 ENCOUNTER — Other Ambulatory Visit: Payer: Self-pay

## 2019-01-19 ENCOUNTER — Ambulatory Visit (INDEPENDENT_AMBULATORY_CARE_PROVIDER_SITE_OTHER): Payer: Medicare Other | Admitting: Internal Medicine

## 2019-01-19 ENCOUNTER — Encounter: Payer: Self-pay | Admitting: Internal Medicine

## 2019-01-19 VITALS — BP 118/70 | HR 73 | Temp 97.3°F | Resp 16 | Ht 63.0 in | Wt 177.0 lb

## 2019-01-19 DIAGNOSIS — J4520 Mild intermittent asthma, uncomplicated: Secondary | ICD-10-CM

## 2019-01-19 DIAGNOSIS — G4733 Obstructive sleep apnea (adult) (pediatric): Secondary | ICD-10-CM | POA: Diagnosis not present

## 2019-01-19 DIAGNOSIS — K219 Gastro-esophageal reflux disease without esophagitis: Secondary | ICD-10-CM | POA: Diagnosis not present

## 2019-01-19 DIAGNOSIS — I10 Essential (primary) hypertension: Secondary | ICD-10-CM | POA: Diagnosis not present

## 2019-01-19 DIAGNOSIS — Z9989 Dependence on other enabling machines and devices: Secondary | ICD-10-CM

## 2019-01-19 NOTE — Telephone Encounter (Signed)
Confirmed appointment with patient. klh °

## 2019-01-19 NOTE — Progress Notes (Signed)
[facility] [place], [place] [address]  Pulmonary Sleep Medicine   Office Visit Note  Patient Name: Susan Shah DOB: 1945/05/21 MRN [id number]  Date of Service: 01/19/2019  Complaints/HPI: Pt is here for pulmonary follow up. Pt reports good compliance with CPAP therapy.  She wears it for at least 4 hours each night. She gets aggravated with it, and takes it off often. Cleaning machine by hand, and changing filters and tubing as directed. Denies headaches, sinus issues, palpitations, or hemoptysis.    ROS  General: (-) fever, (-) chills, (-) night sweats, (-) weakness Skin: (-) rashes, (-) itching,. Eyes: (-) visual changes, (-) redness, (-) itching. Nose and Sinuses: (-) nasal stuffiness or itchiness, (-) postnasal drip, (-) nosebleeds, (-) sinus trouble. Mouth and Throat: (-) sore throat, (-) hoarseness. Neck: (-) swollen glands, (-) enlarged thyroid, (-) neck pain. Respiratory: - cough, (-) bloody sputum, - shortness of breath, - wheezing. Cardiovascular: - ankle swelling, (-) chest pain. Lymphatic: (-) lymph node enlargement. Neurologic: (-) numbness, (-) tingling. Psychiatric: (-) anxiety, (-) depression   Current Medication: Outpatient Encounter Medications as of 01/19/2019  Medication Sig  . amLODipine (NORVASC) 2.5 MG tablet Take 1 tablet (2.5 mg total) by mouth daily.  Marland Kitchen aspirin EC 81 MG tablet Take 81 mg by mouth at bedtime.   Marland Kitchen buPROPion (WELLBUTRIN XL) 150 MG 24 hr tablet Take 1 tablet (150 mg total) by mouth every morning.  . Cholecalciferol (VITAMIN D) 2000 units tablet Take 2,000 Units by mouth daily.  . cyanocobalamin 500 MCG tablet Take 500 mcg by mouth daily.  Marland Kitchen escitalopram (LEXAPRO) 10 MG tablet TAKE ONE TABLET BY MOUTH EVERY DAY AT 5-7 PM  . famotidine (PEPCID) 20 MG tablet Take 1 tablet (20 mg total) by mouth 2 (two) times daily.  Marland Kitchen loratadine (QC LORATADINE ALLERGY RELIEF) 10 MG tablet Take 1 tablet (10 mg total) by mouth  daily.  Marland Kitchen losartan-hydrochlorothiazide (HYZAAR) 100-25 MG tablet Take 1 tablet by mouth daily.  . meclizine (ANTIVERT) 25 MG tablet Take 1 tab po 2 to 3 times a daily as need  . metoCLOPramide (REGLAN) 5 MG tablet TAKE ONE TABLET BY MOUTH TWICE DAILY BEFORE MEALS AND ONE AT BEDTIME  . metoprolol tartrate (LOPRESSOR) 25 MG tablet Take 1 tablet (25 mg total) by mouth 2 (two) times daily with a meal.  . NON FORMULARY cpap device  . oxybutynin (DITROPAN-XL) 10 MG 24 hr tablet Take 2 tablets (20 mg total) by mouth daily.  . pantoprazole (PROTONIX) 40 MG tablet Take 1 tablet (40 mg total) by mouth 2 (two) times daily.  . potassium chloride SA (KLOR-CON) 20 MEQ tablet Take 1 tablet (20 mEq total) by mouth daily.  . ranitidine (ZANTAC) 150 MG tablet Take 1 tablet (150 mg total) by mouth daily as needed for heartburn.  Marland Kitchen rOPINIRole (REQUIP) 0.5 MG tablet Take 1 tablet po BID when needed  . simvastatin (ZOCOR) 20 MG tablet Take 1 tablet (20 mg total) by mouth daily at 6 PM.  . traZODone (DESYREL) 50 MG tablet Take 50 mg by mouth at bedtime. 1-2 tablets as needed for sleep.   No facility-administered encounter medications on file as of 01/19/2019.    Surgical History: Past Surgical History:  Procedure Laterality Date  . [place] STUDY N/A 04/24/2016   Procedure: 17 HOUR PH STUDY;  Surgeon: Lucilla Lame, MD;  Location: [facility] ENDOSCOPY;  Service: Endoscopy;  Laterality: N/A;  . ABDOMINAL HYSTERECTOMY  Total  . APPENDECTOMY    . CARPOMETACARPAL ([place]) FUSION OF THUMB Left 05/16/2016   Procedure: CARPOMETACARPAL [facility] [place] [facility]) FUSION OF THUMB;  Surgeon: Hessie Knows, MD;  Location: [facility] ORS;  Service: Orthopedics;  Laterality: Left;  . CATARACT EXTRACTION Bilateral 12/2012  . CHOLECYSTECTOMY    . COLONOSCOPY  2003  . ESOPHAGEAL MANOMETRY N/A 04/24/2016   Procedure: ESOPHAGEAL MANOMETRY (EM);  Surgeon: Lucilla Lame, MD;  Location: [facility] ENDOSCOPY;  Service: Endoscopy;  Laterality: N/A;  .  ESOPHAGOGASTRODUODENOSCOPY  08/2011  . FOOT SURGERY    . HALLUX VALGUS CORRECTION    . HIP SURGERY Right 1950   tendon release r/t polio  . KNEE ARTHROSCOPY Left 06/23/2008  . RETINAL DETACHMENT SURGERY Right 03/2014  . ROTATOR CUFF REPAIR Bilateral   . URINARY SURGERY  2014   [place]    Medical History: Past Medical History:  Diagnosis Date  . Anxiety   . Asthma 2015   mild, seasonal allergy triggered.  . Cancer ([place]) 2013   skin cancer  on left hand  . Depression   . Esophageal dysmotility   . GERD (gastroesophageal reflux disease) 11/05/2012  . History of hiatal hernia   . Hyperlipidemia   . Hypertension   . Incontinence of urine   . Lung nodule   . OA (osteoarthritis)   . Obesity   . PONV (postoperative nausea and vomiting)   . Post-polio syndrome    contracted at 64 months old  . Pulmonary nodule 11/05/2012   RML  . Shingles 2009  . Sleep apnea     Family History: Family History  Problem Relation Age of Onset  . Heart disease Mother   . Heart disease Father   . Heart disease Brother   . Stroke Maternal Grandmother   . Heart disease Maternal Grandfather   . Heart attack Brother     Social History: Social History   Socioeconomic History  . Marital status: Married    Spouse name: Not on file  . Number of children: Not on file  . Years of education: Not on file  . Highest education level: Not on file  Occupational History  . Not on file  Tobacco Use  . Smoking status: Former Smoker    Quit date: 05/09/1968    Years since quitting: 50.7  . Smokeless tobacco: Never Used  Substance and Sexual Activity  . Alcohol use: No  . Drug use: No  . Sexual activity: Not on file  Other Topics Concern  . Not on file  Social History Narrative  . Not on file   Social Determinants of Health   Financial Resource Strain:   . Difficulty of Paying Living Expenses: Not on file  Food Insecurity:   . Worried About Charity fundraiser in the Last Year: Not on  file  . Ran Out of Food in the Last Year: Not on file  Transportation Needs:   . Lack of Transportation (Medical): Not on file  . Lack of Transportation (Non-Medical): Not on file  Physical Activity:   . Days of Exercise per Week: Not on file  . Minutes of Exercise per Session: Not on file  Stress:   . Feeling of Stress : Not on file  Social Connections:   . Frequency of Communication with Friends and Family: Not on file  . Frequency of Social Gatherings with Friends and Family: Not on file  . Attends Religious Services: Not on file  . Active Member of Clubs or Organizations: Not  on file  . Attends Archivist Meetings: Not on file  . Marital Status: Not on file  Intimate Partner Violence:   . Fear of Current or Ex-Partner: Not on file  . Emotionally Abused: Not on file  . Physically Abused: Not on file  . Sexually Abused: Not on file    Vital Signs: Blood pressure 118/70, pulse 73, temperature (!) 97.3 F (36.3 C), resp. rate 16, height 5\' 3"  (1.6 m), weight 177 lb (80.3 kg), SpO2 97 %.  Examination: General Appearance: The patient is well-developed, well-nourished, and in no distress. Skin: Gross inspection of skin unremarkable. Head: normocephalic, no gross deformities. Eyes: no gross deformities noted. ENT: ears appear grossly normal no exudates. Neck: Supple. No thyromegaly. No LAD. Respiratory: clear bilaterally. Cardiovascular: Normal S1 and S2 without murmur or rub. Extremities: No cyanosis. pulses are equal. Neurologic: Alert and oriented. No involuntary movements.  LABS: Recent Results (from the past 2160 hour(s))  Comprehensive metabolic panel     Status: None   Collection Time: 12/31/18 10:40 AM  Result Value Ref Range   Glucose 85 65 - 99 mg/dL   BUN 12 8 - 27 mg/dL   Creatinine, Ser 0.71 0.57 - 1.00 mg/dL   GFR calc non Af Amer 85 >59 mL/min/1.73   GFR calc Af Amer 98 >59 mL/min/1.73   BUN/Creatinine Ratio 17 12 - 28   Sodium 137 134 - 144  mmol/L   Potassium 3.5 3.5 - 5.2 mmol/L   Chloride 97 96 - 106 mmol/L   CO2 28 20 - 29 mmol/L   Calcium 9.1 8.7 - 10.3 mg/dL   Total Protein 6.5 6.0 - 8.5 g/dL   Albumin 4.1 3.7 - 4.7 g/dL   Globulin, Total 2.4 1.5 - 4.5 g/dL   Albumin/Globulin Ratio 1.7 1.2 - 2.2   Bilirubin Total 0.3 0.0 - 1.2 mg/dL   Alkaline Phosphatase 108 39 - 117 IU/L   AST 14 0 - 40 IU/L   ALT 7 0 - 32 IU/L  CBC     Status: None   Collection Time: 12/31/18 10:40 AM  Result Value Ref Range   WBC 6.3 3.4 - 10.8 x10E3/uL   RBC 4.44 3.77 - 5.28 x10E6/uL   Hemoglobin 12.1 11.1 - 15.9 g/dL   Hematocrit 37.7 34.0 - 46.6 %   MCV 85 79 - 97 fL   MCH 27.3 26.6 - 33.0 pg   MCHC 32.1 31.5 - 35.7 g/dL   RDW 12.8 11.7 - 15.4 %   Platelets 339 150 - 450 x10E3/uL  Lipid Panel w/o Chol/HDL Ratio     Status: None   Collection Time: 12/31/18 10:40 AM  Result Value Ref Range   Cholesterol, Total 163 100 - 199 mg/dL   Triglycerides 128 0 - 149 mg/dL   HDL 58 >39 mg/dL   VLDL Cholesterol Cal 22 5 - 40 mg/dL   LDL Chol Calc (NIH) 83 0 - 99 mg/dL  T4, free     Status: None   Collection Time: 12/31/18 10:40 AM  Result Value Ref Range   Free T4 1.28 0.82 - 1.77 ng/dL  TSH     Status: None   Collection Time: 12/31/18 10:40 AM  Result Value Ref Range   TSH 2.220 0.450 - 4.500 uIU/mL  VITAMIN D 25 Hydroxy (Vit-D Deficiency, Fractures)     Status: Abnormal   Collection Time: 12/31/18 10:40 AM  Result Value Ref Range   Vit D, 25-Hydroxy 28.9 (L) 30.0 - 100.0  ng/mL    Comment: Vitamin D deficiency has been defined by the [place] practice guideline as a level of serum 25-OH vitamin D less than 20 ng/mL (1,2). The Endocrine Society went on to further define vitamin D insufficiency as a level between 21 and 29 ng/mL (2). 1. IOM (Institute of Medicine). 2010. Dietary reference    intakes for calcium and D. [place]: The    [employer]. 2. Holick MF, Binkley [place],  Bischoff-Ferrari HA, et al.    Evaluation, treatment, and prevention of vitamin D    deficiency: an Endocrine Society clinical practice    guideline. JCEM. 2011 Jul; 96(7):1911-30.   T3     Status: None   Collection Time: 12/31/18 10:40 AM  Result Value Ref Range   T3, Total 103 71 - 180 ng/dL    Radiology: NM Myocar Multi W/Spect W/Wall Motion / EF  Result Date: 08/25/2017  The study is normal.  This is a low risk study.  The left ventricular ejection fraction is normal (55-65%).  There was no ST segment deviation noted during stress.  Negative lexiscan stress LV appears well preserved. No evidence of ischemia. Low risk study.   DG Chest Port 1 View  Result Date: 08/24/2017 CLINICAL DATA:  Acute onset of generalized chest pain. EXAM: PORTABLE CHEST 1 VIEW COMPARISON:  Chest radiograph performed 02/24/2009 FINDINGS: The lungs are well-aerated and clear. There is no evidence of focal opacification, pleural effusion or pneumothorax. The cardiomediastinal silhouette is within normal limits. No acute osseous abnormalities are seen. IMPRESSION: No acute cardiopulmonary process seen. Electronically Signed   By: Garald Balding M.D.   On: 08/24/2017 23:30    No results found.  No results found.    Assessment and Plan: Patient Active Problem List   Diagnosis Date Noted  . Cough due to ACE inhibitor 07/10/2018  . Other fatigue 02/16/2018  . Vertigo 02/08/2018  . Screening for breast cancer 12/17/2017  . Flu vaccine need 10/30/2017  . Need for vaccination against Streptococcus pneumoniae using pneumococcal conjugate vaccine 13 10/30/2017  . Hypokalemia 08/28/2017  . Absolute anemia 08/28/2017  . Benign essential tremor 08/28/2017  . Chest pain 08/25/2017  . Depression 03/23/2017  . Hiatal hernia 04/11/2016  . Anxiety   . OA (osteoarthritis)   . Hyperlipidemia   . Essential hypertension   . Post-polio syndrome   . Lung nodule   . Esophageal dysmotility   . Incontinence of urine    . Obesity   . GERD (gastroesophageal reflux disease) 11/05/2012  . Pulmonary nodule 11/05/2012  . Shingles 01/22/2007   1. OSA on CPAP Continue to wear cpap at night, discussed compliance.   2. Mild intermittent asthma without complication Stable, get pulmonary function test and follow up.  - Pulmonary function test; Future  3. Gastroesophageal reflux disease without esophagitis Controlled, continue present management.   4. Essential hypertension Stable, continue present management.    General Counseling: I have discussed the findings of the evaluation and examination with [facility].  I have also discussed any further diagnostic evaluation thatmay be needed or ordered today. Jae verbalizes understanding of the findings of todays visit. We also reviewed her medications today and discussed drug interactions and side effects including but not limited excessive drowsiness and altered mental states. We also discussed that there is always a risk not just to her but also people around her. she has been encouraged to call the office with any questions or concerns  that should arise related to todays visit.  No orders of the defined types were placed in this encounter.    Time spent: 25 This patient was seen by Orson Gear AGNP-C in Collaboration with Dr. Devona Konig as a part of collaborative care agreement.   I have personally obtained a history, examined the patient, evaluated laboratory and imaging results, formulated the assessment and plan and placed orders.    [name], MD [facility] Pulmonary and Critical Care Sleep medicine

## 2019-02-01 ENCOUNTER — Other Ambulatory Visit: Payer: Self-pay

## 2019-02-01 ENCOUNTER — Telehealth: Payer: Self-pay

## 2019-02-01 DIAGNOSIS — G250 Essential tremor: Secondary | ICD-10-CM

## 2019-02-01 MED ORDER — ROPINIROLE HCL 0.5 MG PO TABS: ORAL_TABLET | ORAL | 3 refills | Status: DC

## 2019-02-01 MED ORDER — OXYBUTYNIN CHLORIDE ER 10 MG PO TB24: 20.0000 mg | ORAL_TABLET | Freq: Every day | ORAL | 3 refills | Status: DC

## 2019-02-01 NOTE — Telephone Encounter (Signed)
Confirmed appointment with patient. klh °

## 2019-02-03 ENCOUNTER — Ambulatory Visit: Payer: Medicare Other | Admitting: Internal Medicine

## 2019-02-11 ENCOUNTER — Ambulatory Visit
Admission: RE | Admit: 2019-02-11 | Discharge: 2019-02-11 | Disposition: A | Discharge status: Home / Self Care | Payer: Medicare Other | Source: Ambulatory Visit | Attending: Adult Health | Admitting: Adult Health

## 2019-02-11 DIAGNOSIS — Z1231 Encounter for screening mammogram for malignant neoplasm of breast: Secondary | ICD-10-CM | POA: Diagnosis not present

## 2019-02-22 ENCOUNTER — Ambulatory Visit: Payer: Medicare Other | Admitting: Internal Medicine

## 2019-02-27 IMAGING — US US EXTREM LOW VENOUS*L*
1 series · 13 of 24 positions shown · non-contrast
Comparison: Left knee radiographs 03/12/2011

CLINICAL DATA: 71-year-old female with acute calf pain since last
night. Recent hernia surgery. Varicose veins



[Series 1: us extrem low venous*left* · 0.08mm/px · 13 of 34 slices shown]
[im 1/34]
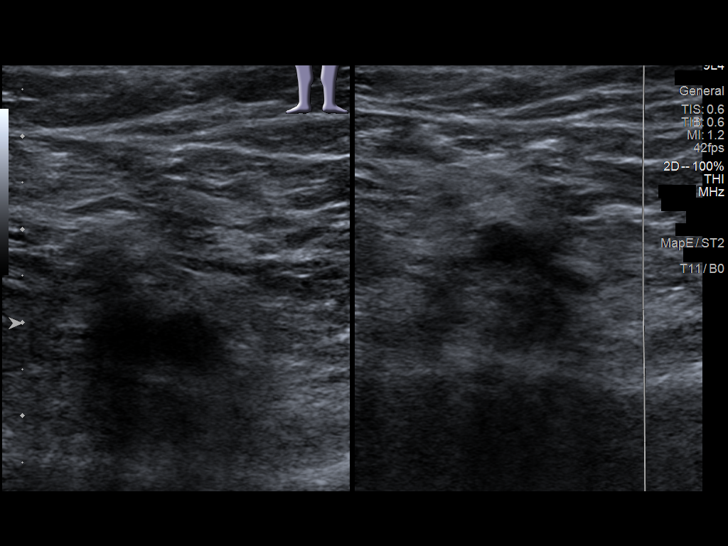
[im 3/34]
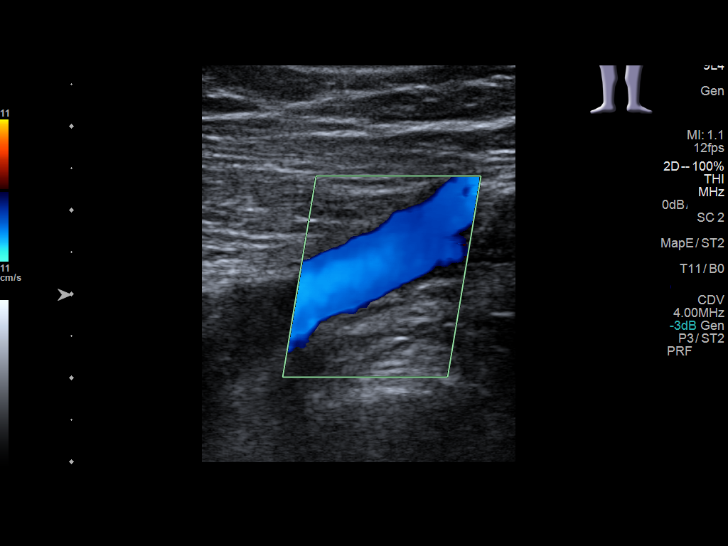
[im 6/34]
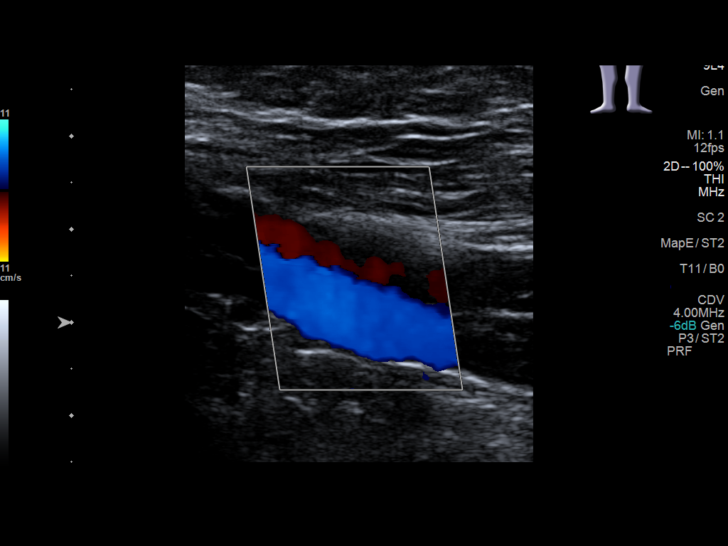
[im 9/34]
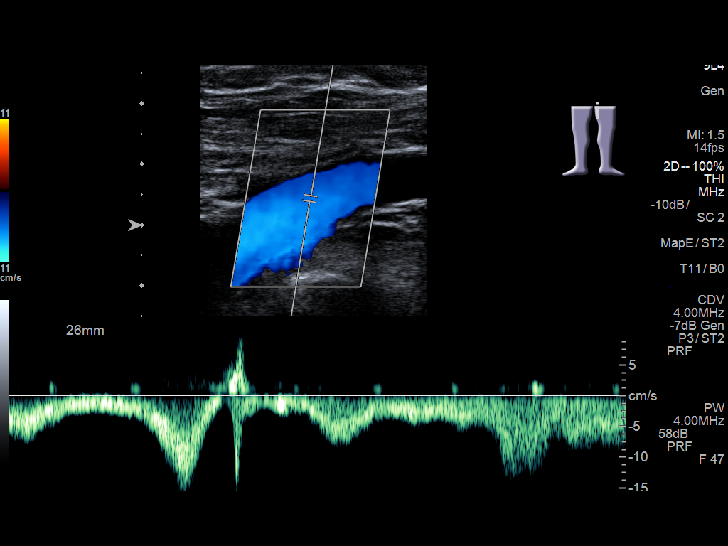
[im 12/34]
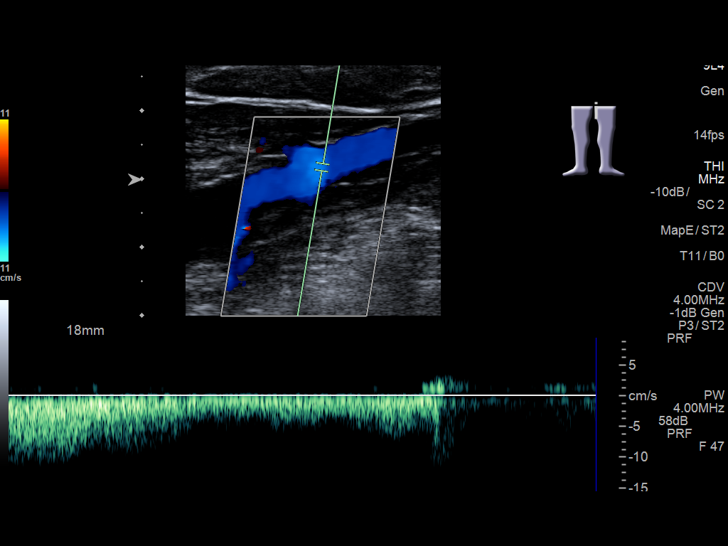
[im 15/34]
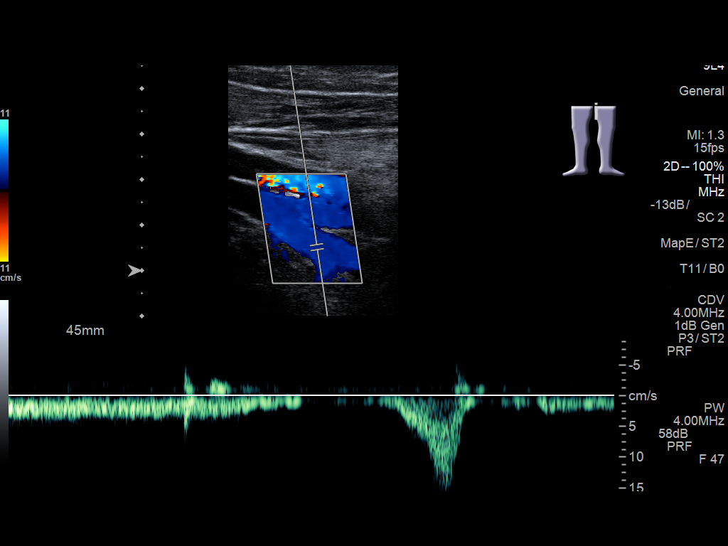
[im 18/34]
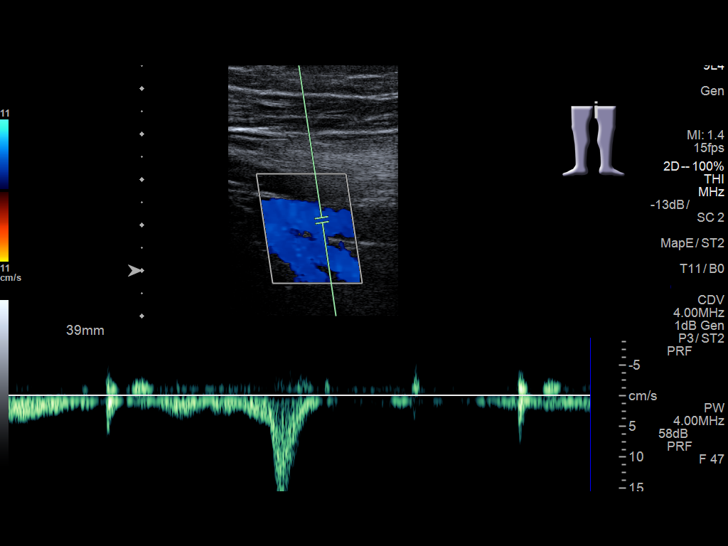
[im 19/34]
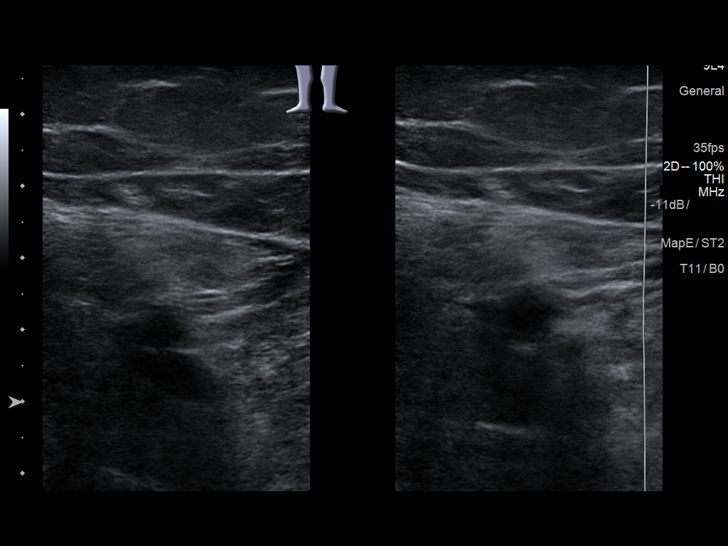
[im 22/34]
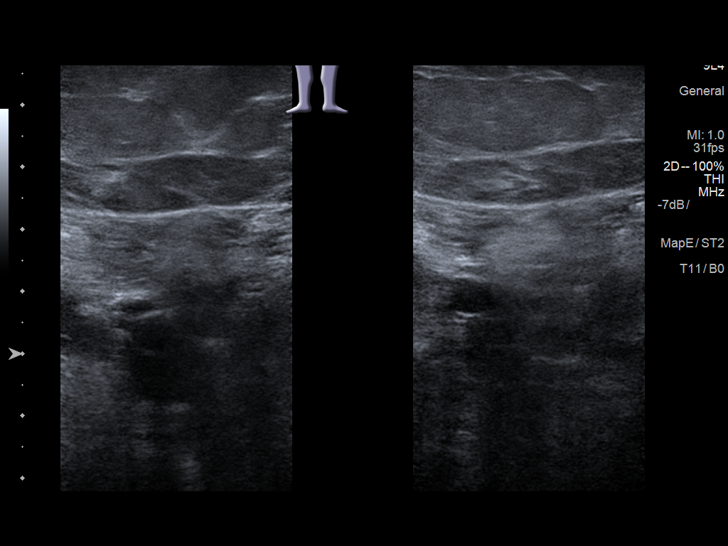
[im 25/34]
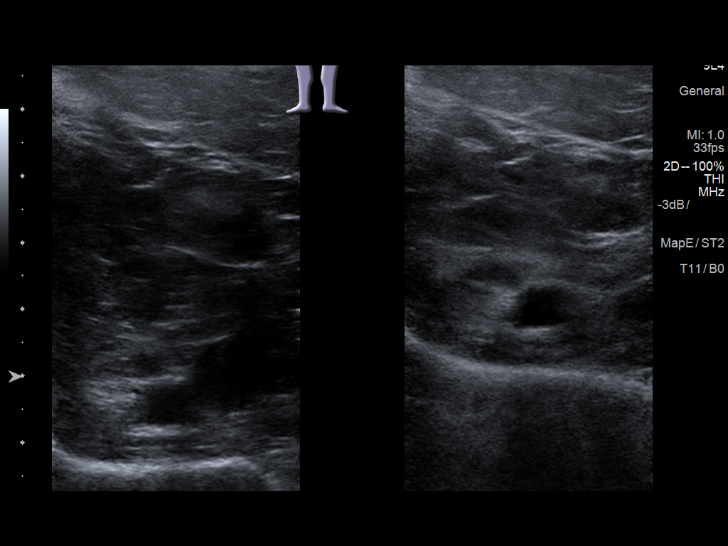
[im 28/34]
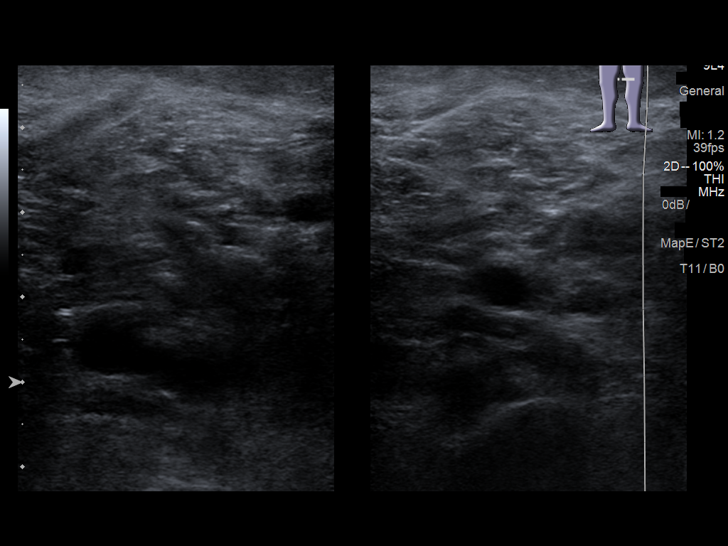
[im 31/34]
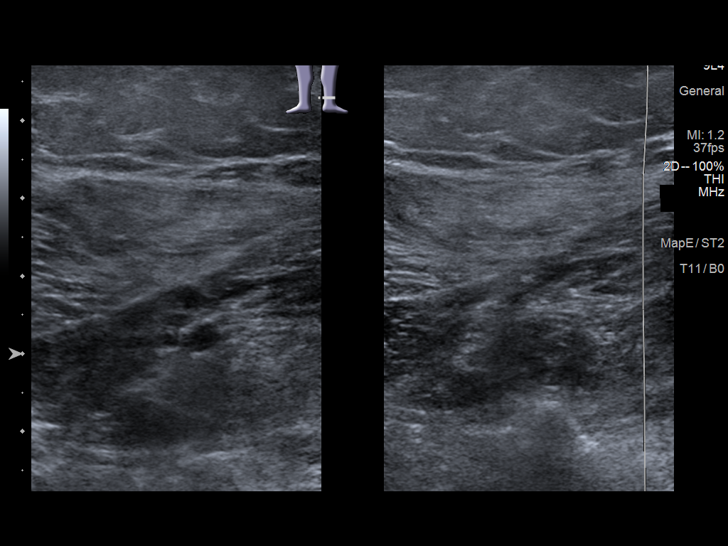
[im 34/34]
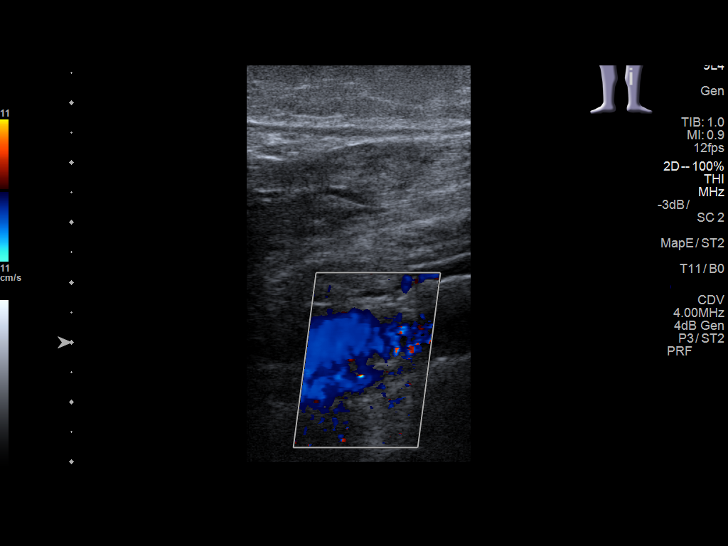

[13 of 24 positions shown; findings below may reference images not displayed]

FINDINGS: Contralateral Common Femoral Vein: Respiratory phasicity is normal
and symmetric with the symptomatic side. No evidence of thrombus.
Normal compressibility.

Common Femoral Vein: No evidence of thrombus. Normal
compressibility, respiratory phasicity and response to augmentation.

Saphenofemoral Junction: No evidence of thrombus. Normal
compressibility and flow on color Doppler imaging.

Profunda Femoral Vein: No evidence of thrombus. Normal
compressibility and flow on color Doppler imaging.

Femoral Vein: No evidence of thrombus. Normal compressibility,
respiratory phasicity and response to augmentation.

Popliteal Vein: No evidence of thrombus. Normal compressibility,
respiratory phasicity and response to augmentation.

Calf Veins: No evidence of thrombus. Normal compressibility and flow
on color Doppler imaging.

Venous Reflux:  None.

Other Findings:  None.
IMPRESSION: No evidence of left lower extremity deep venous thrombosis.

## 2019-03-08 ENCOUNTER — Other Ambulatory Visit: Payer: Self-pay

## 2019-03-08 MED ORDER — BUPROPION HCL ER (XL) 150 MG PO TB24: 150.0000 mg | ORAL_TABLET | ORAL | 1 refills | Status: DC

## 2019-03-19 DIAGNOSIS — M25542 Pain in joints of left hand: Secondary | ICD-10-CM | POA: Diagnosis not present

## 2019-03-19 DIAGNOSIS — T85848A Pain due to other internal prosthetic devices, implants and grafts, initial encounter: Secondary | ICD-10-CM | POA: Diagnosis not present

## 2019-03-19 DIAGNOSIS — M79645 Pain in left finger(s): Secondary | ICD-10-CM | POA: Diagnosis not present

## 2019-03-19 DIAGNOSIS — M79642 Pain in left hand: Secondary | ICD-10-CM | POA: Diagnosis not present

## 2019-03-23 ENCOUNTER — Other Ambulatory Visit: Payer: Self-pay | Admitting: Orthopedic Surgery

## 2019-03-25 ENCOUNTER — Encounter
Admission: RE | Admit: 2019-03-25 | Discharge: 2019-03-25 | Disposition: A | Discharge status: Home / Self Care | Payer: Medicare Other | Source: Ambulatory Visit | Attending: Orthopedic Surgery | Admitting: Orthopedic Surgery

## 2019-03-25 ENCOUNTER — Other Ambulatory Visit: Payer: Self-pay

## 2019-03-25 NOTE — Patient Instructions (Signed)
Your procedure is scheduled on: 03/30/19 Report to [facility]. To find out your arrival time please call [phone] between 1PM - 3PM on 03/29/19.  Remember: Instructions that are not followed completely may result in serious medical risk, up to and including death, or upon the discretion of your surgeon and anesthesiologist your surgery may need to be rescheduled.     _X__ 1. Do not eat food after midnight the night before your procedure.                 No gum chewing or hard candies. You may drink clear liquids up to 2 hours                 before you are scheduled to arrive for your surgery- DO not drink clear                 liquids within 2 hours of the start of your surgery.                 Clear Liquids include:  water, apple juice without pulp, clear carbohydrate                 drink such as Clearfast or Gatorade, Black Coffee or Tea (Do not add                 anything to coffee or tea). Diabetics water only  __X__2.  On the morning of surgery brush your teeth with toothpaste and water, you                 may rinse your mouth with mouthwash if you wish.  Do not swallow any              toothpaste of mouthwash.     _X__ 3.  No Alcohol for 24 hours before or after surgery.   _X__ 4.  Do Not Smoke or use e-cigarettes For 24 Hours Prior to Your Surgery.                 Do not use any chewable tobacco products for at least 6 hours prior to                 surgery.  ____  5.  Bring all medications with you on the day of surgery if instructed.   __X__  6.  Notify your doctor if there is any change in your medical condition      (cold, fever, infections).     Do not wear jewelry, make-up, hairpins, clips or nail polish. Do not wear lotions, powders, or perfumes.  Do not shave 48 hours prior to surgery. Men may shave face and neck. Do not bring valuables to the hospital.    [facility] is not responsible for any belongings or  valuables.  Contacts, dentures/partials or body piercings may not be worn into surgery. Bring a case for your contacts, glasses or hearing aids, a denture cup will be supplied. Leave your suitcase in the car. After surgery it may be brought to your room. For patients admitted to the hospital, discharge time is determined by your treatment team.   Patients discharged the day of surgery will not be allowed to drive home.   Please read over the following fact sheets that you were given:   MRSA Information  __X__ Take these medicines the morning of surgery with A SIP OF WATER:  1. amLODipine (NORVASC) 2.5 MG tablet  2. buPROPion (WELLBUTRIN XL) 150 MG 24 hr tablet  3. loratadine (QC LORATADINE ALLERGY RELIEF) 10 MG tablet  4. metoprolol tartrate (LOPRESSOR) 25 MG tablet  5. oxybutynin (DITROPAN-XL) 10 MG 24 hr tablet  6. pantoprazole (PROTONIX) 40 MG tablet  7. Requip if needed  ____ Fleet Enema (as directed)   __X__ Use CHG Soap/SAGE wipes as directed  ____ Use inhalers on the day of surgery  ____ Stop metformin/Janumet/Farxiga 2 days prior to surgery    ____ Take 1/2 of usual insulin dose the night before surgery. No insulin the morning          of surgery.   ____ Stop Blood Thinners Coumadin/Plavix/Xarelto/Pleta/Pradaxa/Eliquis/Effient/Aspirin  on   Or contact your Surgeon, Cardiologist or Medical Doctor regarding  ability to stop your blood thinners  __X__ Stop Anti-inflammatories 7 days before surgery such as Advil, Ibuprofen, Motrin,  BC or Goodies Powder, Naprosyn, Naproxen, Aleve, Aspirin    __X__ Stop all herbal supplements, fish oil or vitamin E until after surgery.    ____ Bring C-Pap to the hospital. YOU DO NOT HAVE TO BRING YOUR CPAP TO THIS VISIT

## 2019-03-26 ENCOUNTER — Other Ambulatory Visit: Payer: Self-pay

## 2019-03-26 ENCOUNTER — Encounter
Admission: RE | Admit: 2019-03-26 | Discharge: 2019-03-26 | Disposition: A | Discharge status: Home / Self Care | Payer: Medicare Other | Source: Ambulatory Visit | Attending: Orthopedic Surgery | Admitting: Orthopedic Surgery

## 2019-03-26 DIAGNOSIS — E785 Hyperlipidemia, unspecified: Secondary | ICD-10-CM | POA: Insufficient documentation

## 2019-03-26 DIAGNOSIS — I10 Essential (primary) hypertension: Secondary | ICD-10-CM | POA: Insufficient documentation

## 2019-03-26 DIAGNOSIS — R9431 Abnormal electrocardiogram [ECG] [EKG]: Secondary | ICD-10-CM | POA: Insufficient documentation

## 2019-03-26 DIAGNOSIS — Z20822 Contact with and (suspected) exposure to covid-19: Secondary | ICD-10-CM | POA: Diagnosis not present

## 2019-03-26 DIAGNOSIS — E786 Lipoprotein deficiency: Secondary | ICD-10-CM | POA: Diagnosis not present

## 2019-03-26 DIAGNOSIS — Z01818 Encounter for other preprocedural examination: Secondary | ICD-10-CM | POA: Diagnosis not present

## 2019-03-26 LAB — SARS CORONAVIRUS 2 (TAT 6-24 HRS): SARS Coronavirus 2: NEGATIVE

## 2019-03-26 MED ORDER — SIMVASTATIN 20 MG PO TABS: 20.0000 mg | ORAL_TABLET | Freq: Every day | ORAL | 1 refills | Status: DC

## 2019-03-26 NOTE — Pre-Procedure Instructions (Signed)
ECG 12-lead8/14/2018 [place] Component Name Value Ref Range  Vent Rate (bpm) 61   PR Interval (msec) 214   QRS Interval (msec) 88   QT Interval (msec) 432   QTc (msec) 434   Other Result Information  This result has an attachment that is not available.  Result Narrative  Sinus rhythm 1st degree AV block ST and T wave abnormality, consider inferior ischemia ST and T wave abnormality, consider anterior ischemia Abnormal ECG No previous ECGs available I reviewed and concur with this report. Electronically signed [id number] MD, BENJAMIN [phone]) on 09/03/2016 4:27:17 PM  Status Results Details   Encounter Summary

## 2019-03-27 ENCOUNTER — Encounter: Payer: Self-pay | Admitting: Internal Medicine

## 2019-03-28 NOTE — Telephone Encounter (Signed)
Hey. I got this message over [place]. Can you change it and let patient know when appointment will be scheduled for. Thanks.

## 2019-03-30 ENCOUNTER — Ambulatory Visit: Payer: Medicare Other | Admitting: Certified Registered"

## 2019-03-30 ENCOUNTER — Encounter
Admission: RE | Disposition: A | Discharge status: Home / Self Care | Payer: Self-pay | Source: Home / Self Care | Attending: Orthopedic Surgery

## 2019-03-30 ENCOUNTER — Ambulatory Visit
Admission: RE | Admit: 2019-03-30 | Discharge: 2019-03-30 | Disposition: A | Discharge status: Home / Self Care | Payer: Medicare Other | Attending: Orthopedic Surgery | Admitting: Orthopedic Surgery

## 2019-03-30 ENCOUNTER — Encounter: Payer: Self-pay | Admitting: Orthopedic Surgery

## 2019-03-30 ENCOUNTER — Other Ambulatory Visit: Payer: Self-pay

## 2019-03-30 ENCOUNTER — Ambulatory Visit: Payer: Medicare Other

## 2019-03-30 DIAGNOSIS — T8484XA Pain due to internal orthopedic prosthetic devices, implants and grafts, initial encounter: Secondary | ICD-10-CM | POA: Insufficient documentation

## 2019-03-30 DIAGNOSIS — Z9989 Dependence on other enabling machines and devices: Secondary | ICD-10-CM | POA: Diagnosis not present

## 2019-03-30 DIAGNOSIS — G8311 Monoplegia of lower limb affecting right dominant side: Secondary | ICD-10-CM | POA: Diagnosis not present

## 2019-03-30 DIAGNOSIS — J45909 Unspecified asthma, uncomplicated: Secondary | ICD-10-CM | POA: Diagnosis not present

## 2019-03-30 DIAGNOSIS — X58XXXA Exposure to other specified factors, initial encounter: Secondary | ICD-10-CM | POA: Insufficient documentation

## 2019-03-30 DIAGNOSIS — R29898 Other symptoms and signs involving the musculoskeletal system: Secondary | ICD-10-CM | POA: Diagnosis not present

## 2019-03-30 DIAGNOSIS — G4730 Sleep apnea, unspecified: Secondary | ICD-10-CM | POA: Diagnosis not present

## 2019-03-30 DIAGNOSIS — M79645 Pain in left finger(s): Secondary | ICD-10-CM | POA: Diagnosis not present

## 2019-03-30 DIAGNOSIS — T85848A Pain due to other internal prosthetic devices, implants and grafts, initial encounter: Secondary | ICD-10-CM | POA: Diagnosis not present

## 2019-03-30 DIAGNOSIS — F329 Major depressive disorder, single episode, unspecified: Secondary | ICD-10-CM | POA: Insufficient documentation

## 2019-03-30 DIAGNOSIS — R519 Headache, unspecified: Secondary | ICD-10-CM | POA: Diagnosis not present

## 2019-03-30 DIAGNOSIS — D649 Anemia, unspecified: Secondary | ICD-10-CM | POA: Diagnosis not present

## 2019-03-30 DIAGNOSIS — R42 Dizziness and giddiness: Secondary | ICD-10-CM | POA: Diagnosis not present

## 2019-03-30 DIAGNOSIS — G4733 Obstructive sleep apnea (adult) (pediatric): Secondary | ICD-10-CM | POA: Diagnosis not present

## 2019-03-30 DIAGNOSIS — R4189 Other symptoms and signs involving cognitive functions and awareness: Secondary | ICD-10-CM | POA: Diagnosis not present

## 2019-03-30 DIAGNOSIS — F419 Anxiety disorder, unspecified: Secondary | ICD-10-CM | POA: Diagnosis not present

## 2019-03-30 DIAGNOSIS — I10 Essential (primary) hypertension: Secondary | ICD-10-CM | POA: Diagnosis not present

## 2019-03-30 DIAGNOSIS — Z87891 Personal history of nicotine dependence: Secondary | ICD-10-CM | POA: Diagnosis not present

## 2019-03-30 DIAGNOSIS — G8918 Other acute postprocedural pain: Secondary | ICD-10-CM

## 2019-03-30 DIAGNOSIS — F418 Other specified anxiety disorders: Secondary | ICD-10-CM | POA: Diagnosis not present

## 2019-03-30 DIAGNOSIS — M79642 Pain in left hand: Secondary | ICD-10-CM | POA: Diagnosis not present

## 2019-03-30 DIAGNOSIS — M19032 Primary osteoarthritis, left wrist: Secondary | ICD-10-CM | POA: Diagnosis not present

## 2019-03-30 HISTORY — PX: CARPOMETACARPAL (CMC) FUSION OF THUMB: SHX6290

## 2019-03-30 SURGERY — CARPOMETACARPAL (CMC) FUSION OF THUMB
Anesthesia: General | Site: Thumb | Laterality: Left

## 2019-03-30 MED ORDER — PHENYLEPHRINE HCL (PRESSORS) 10 MG/ML IV SOLN
INTRAVENOUS | Status: DC | PRN
  Administered 2019-03-30: 50 ug via INTRAVENOUS
  Administered 2019-03-30 (×2): 25 ug via INTRAVENOUS

## 2019-03-30 MED ORDER — PROPOFOL 10 MG/ML IV BOLUS
INTRAVENOUS | Status: DC | PRN
  Administered 2019-03-30: 130 mg via INTRAVENOUS
  Administered 2019-03-30: 60 mg via INTRAVENOUS

## 2019-03-30 MED ORDER — GLYCOPYRROLATE 0.2 MG/ML IJ SOLN
INTRAMUSCULAR | Status: DC | PRN
  Administered 2019-03-30 (×2): .1 mg via INTRAVENOUS

## 2019-03-30 MED ORDER — NEOMYCIN-POLYMYXIN B GU 40-200000 IR SOLN
Status: DC | PRN
  Administered 2019-03-30: 2 mL

## 2019-03-30 MED ORDER — ONDANSETRON HCL 4 MG/2ML IJ SOLN
INTRAMUSCULAR | Status: DC | PRN
  Administered 2019-03-30: 4 mg via INTRAVENOUS

## 2019-03-30 MED ORDER — EPHEDRINE 5 MG/ML INJ
INTRAVENOUS | Status: AC
  Filled 2019-03-30: qty 10

## 2019-03-30 MED ORDER — SODIUM CHLORIDE 0.9 % IV SOLN: INTRAVENOUS | Status: DC

## 2019-03-30 MED ORDER — GELATIN ABSORBABLE 12-7 MM EX MISC
CUTANEOUS | Status: DC | PRN
  Administered 2019-03-30: 1

## 2019-03-30 MED ORDER — METOCLOPRAMIDE HCL 5 MG/ML IJ SOLN: 5.0000 mg | Freq: Three times a day (TID) | INTRAMUSCULAR | Status: DC | PRN

## 2019-03-30 MED ORDER — CEFAZOLIN SODIUM-DEXTROSE 2-4 GM/100ML-% IV SOLN
INTRAVENOUS | Status: AC
  Filled 2019-03-30: qty 100

## 2019-03-30 MED ORDER — PROPOFOL 10 MG/ML IV BOLUS
INTRAVENOUS | Status: AC
  Filled 2019-03-30: qty 20

## 2019-03-30 MED ORDER — DEXAMETHASONE SODIUM PHOSPHATE 10 MG/ML IJ SOLN
INTRAMUSCULAR | Status: DC | PRN
  Administered 2019-03-30: 5 mg via INTRAVENOUS

## 2019-03-30 MED ORDER — METOCLOPRAMIDE HCL 10 MG PO TABS: 5.0000 mg | ORAL_TABLET | Freq: Three times a day (TID) | ORAL | Status: DC | PRN

## 2019-03-30 MED ORDER — CHLORHEXIDINE GLUCONATE 4 % EX LIQD
60.0000 mL | Freq: Once | CUTANEOUS | Status: AC
  Administered 2019-03-30: 4 via TOPICAL

## 2019-03-30 MED ORDER — FENTANYL CITRATE (PF) 100 MCG/2ML IJ SOLN
INTRAMUSCULAR | Status: AC
  Filled 2019-03-30: qty 2

## 2019-03-30 MED ORDER — GLYCOPYRROLATE 0.2 MG/ML IJ SOLN
INTRAMUSCULAR | Status: AC
  Filled 2019-03-30: qty 1

## 2019-03-30 MED ORDER — ONDANSETRON HCL 4 MG PO TABS: 4.0000 mg | ORAL_TABLET | Freq: Four times a day (QID) | ORAL | Status: DC | PRN

## 2019-03-30 MED ORDER — GELATIN ABSORBABLE 12-7 MM EX MISC
CUTANEOUS | Status: AC
  Filled 2019-03-30: qty 1

## 2019-03-30 MED ORDER — ONDANSETRON HCL 4 MG/2ML IJ SOLN
INTRAMUSCULAR | Status: AC
  Filled 2019-03-30: qty 2

## 2019-03-30 MED ORDER — LIDOCAINE HCL (CARDIAC) PF 100 MG/5ML IV SOSY
PREFILLED_SYRINGE | INTRAVENOUS | Status: DC | PRN
  Administered 2019-03-30: 80 mg via INTRAVENOUS

## 2019-03-30 MED ORDER — HYDROCODONE-ACETAMINOPHEN 5-325 MG PO TABS: 1.0000 | ORAL_TABLET | ORAL | 0 refills | Status: DC | PRN

## 2019-03-30 MED ORDER — DEXAMETHASONE SODIUM PHOSPHATE 10 MG/ML IJ SOLN
INTRAMUSCULAR | Status: AC
  Filled 2019-03-30: qty 1

## 2019-03-30 MED ORDER — CEFAZOLIN SODIUM-DEXTROSE 2-4 GM/100ML-% IV SOLN
2.0000 g | INTRAVENOUS | Status: AC
  Administered 2019-03-30: 2 g via INTRAVENOUS

## 2019-03-30 MED ORDER — LACTATED RINGERS IV SOLN: INTRAVENOUS | Status: DC

## 2019-03-30 MED ORDER — BUPIVACAINE HCL (PF) 0.5 % IJ SOLN
INTRAMUSCULAR | Status: AC
  Filled 2019-03-30: qty 30

## 2019-03-30 MED ORDER — NEOMYCIN-POLYMYXIN B GU 40-200000 IR SOLN
Status: AC
  Filled 2019-03-30: qty 2

## 2019-03-30 MED ORDER — EPHEDRINE SULFATE 50 MG/ML IJ SOLN
INTRAMUSCULAR | Status: DC | PRN
  Administered 2019-03-30: 10 mg via INTRAVENOUS
  Administered 2019-03-30: 15 mg via INTRAVENOUS

## 2019-03-30 MED ORDER — ONDANSETRON HCL 4 MG/2ML IJ SOLN: 4.0000 mg | Freq: Four times a day (QID) | INTRAMUSCULAR | Status: DC | PRN

## 2019-03-30 MED ORDER — BUPIVACAINE HCL (PF) 0.5 % IJ SOLN
INTRAMUSCULAR | Status: DC | PRN
  Administered 2019-03-30: 10 mL

## 2019-03-30 MED ORDER — FENTANYL CITRATE (PF) 100 MCG/2ML IJ SOLN
INTRAMUSCULAR | Status: DC | PRN
  Administered 2019-03-30 (×4): 25 ug via INTRAVENOUS

## 2019-03-30 SURGICAL SUPPLY — 32 items
BLADE OSC/SAGITTAL 5.5X25 (BLADE) ×1 IMPLANT
BNDG CONFORM 3 STRL LF (GAUZE/BANDAGES/DRESSINGS) ×2 IMPLANT
BNDG ELASTIC 3X5.8 VLCR NS LF (GAUZE/BANDAGES/DRESSINGS) ×2 IMPLANT
CAST PADDING 3X4FT ST 30246 (SOFTGOODS) ×1
CHLORAPREP W/TINT 26 (MISCELLANEOUS) ×2 IMPLANT
COVER WAND RF STERILE (DRAPES) ×2 IMPLANT
CUFF TOURN SGL QUICK 18X4 (TOURNIQUET CUFF) ×1 IMPLANT
DRAPE FLUOR MINI C-ARM 54X84 (DRAPES) ×2 IMPLANT
ELECT CAUTERY BLADE 6.4 (BLADE) ×2 IMPLANT
GAUZE SPONGE 4X4 12PLY STRL (GAUZE/BANDAGES/DRESSINGS) ×2 IMPLANT
GAUZE XEROFORM 1X8 LF (GAUZE/BANDAGES/DRESSINGS) ×2 IMPLANT
GLOVE SURG SYN 9.0  PF PI (GLOVE) ×1
GLOVE SURG SYN 9.0 PF PI (GLOVE) ×1 IMPLANT
GOWN SRG 2XL LVL 4 RGLN SLV (GOWNS) ×1 IMPLANT
GOWN STRL NON-REIN 2XL LVL4 (GOWNS) ×1
GOWN STRL REUS W/ TWL LRG LVL3 (GOWN DISPOSABLE) ×1 IMPLANT
GOWN STRL REUS W/TWL LRG LVL3 (GOWN DISPOSABLE) ×1
KIT TURNOVER KIT A (KITS) ×2 IMPLANT
NS IRRIG 500ML POUR BTL (IV SOLUTION) ×2 IMPLANT
PACK EXTREMITY ARMC (MISCELLANEOUS) ×2 IMPLANT
PAD CAST CTTN 3X4 STRL (SOFTGOODS) ×1 IMPLANT
SCALPEL PROTECTED #15 DISP (BLADE) ×4 IMPLANT
SPLINT CAST 1 STEP 3X12 (MISCELLANEOUS) ×2 IMPLANT
SPLINT WRIST M LT TX990308 (SOFTGOODS) ×2 IMPLANT
SUT ETHILON 4-0 (SUTURE) ×1
SUT ETHILON 4-0 FS2 18XMFL BLK (SUTURE) ×1
SUT VIC AB 0 CT2 27 (SUTURE) ×4 IMPLANT
SUT VIC AB 3-0 SH 27 (SUTURE) ×1
SUT VIC AB 3-0 SH 27X BRD (SUTURE) ×1 IMPLANT
SUTURE ETHLN 4-0 FS2 18XMF BLK (SUTURE) ×1 IMPLANT
SYSTEM IMPLANT TIGHTROPE MINI (Anchor) ×2 IMPLANT
WIRE Z .062 C-WIRE SPADE TIP (WIRE) ×4 IMPLANT

## 2019-03-30 NOTE — Anesthesia Postprocedure Evaluation (Signed)
Anesthesia Post Note  Patient: Susan Shah  Procedure(s) Performed: LEFT THUMB SUSPENSION PLASTY (Left Thumb)  Patient location during evaluation: PACU Anesthesia Type: General Level of consciousness: awake and alert Pain management: pain level controlled Vital Signs Assessment: post-procedure vital signs reviewed and stable Respiratory status: spontaneous breathing, nonlabored ventilation, respiratory function stable and patient connected to nasal cannula oxygen Cardiovascular status: blood pressure returned to baseline and stable Postop Assessment: no apparent nausea or vomiting Anesthetic complications: no     Last Vitals:  Vitals:   03/30/19 1640 03/30/19 1646  BP: 138/72 137/73  Pulse: 80 81  Resp: (!) 26 16  Temp: (!) 36.2 C 36.8 C  SpO2: 99% 100%    Last Pain:  Vitals:   03/30/19 1646  TempSrc: Temporal  PainSc: 0-No pain                 Martha Clan

## 2019-03-30 NOTE — Anesthesia Procedure Notes (Signed)
Procedure Name: LMA Insertion Date/Time: 03/30/2019 2:41 PM Performed by: Hedda Slade, CRNA Pre-anesthesia Checklist: Patient identified, Patient being monitored, Timeout performed, Emergency Drugs available and Suction available Patient Re-evaluated:Patient Re-evaluated prior to induction Oxygen Delivery Method: Circle system utilized Preoxygenation: Pre-oxygenation with 100% oxygen Induction Type: IV induction Ventilation: Mask ventilation without difficulty LMA: LMA inserted LMA Size: 4.0 Tube type: Oral Number of attempts: 1 Placement Confirmation: positive ETCO2 and breath sounds checked- equal and bilateral Tube secured with: Tape Dental Injury: Teeth and Oropharynx as per pre-operative assessment

## 2019-03-30 NOTE — Discharge Instructions (Addendum)
Keep arm elevated is much as possible. Ice to the back of the hand and wrist may help with pain for today and tomorrow. Pain medicine as directed. Do not worry if there is little bloody drainage that is to be expected. Work on finger motion except for the thumb. Call if you are having severe pain not relieved by your pain medication tomorrow.   AMBULATORY SURGERY  DISCHARGE INSTRUCTIONS   1) The drugs that you were given will stay in your system until tomorrow so for the next 24 hours you should not:  A) Drive an automobile B) Make any legal decisions C) Drink any alcoholic beverage   2) You may resume regular meals tomorrow.  Today it is better to start with liquids and gradually work up to solid foods.  You may eat anything you prefer, but it is better to start with liquids, then soup and crackers, and gradually work up to solid foods.   3) Please notify your doctor immediately if you have any unusual bleeding, trouble breathing, redness and pain at the surgery site, drainage, fever, or pain not relieved by medication.    4) Additional Instructions:        Please contact your physician with any problems or Same Day Surgery at [phone], Monday through Friday 6 am to 4 pm, or  at [facility] number at [phone].

## 2019-03-30 NOTE — H&P (Signed)
Reviewed paper H+P, will be scanned into chart. No changes noted.  

## 2019-03-30 NOTE — Transfer of Care (Signed)
Immediate Anesthesia Transfer of Care Note  Patient: Susan Shah  Procedure(s) Performed: LEFT THUMB SUSPENSION PLASTY (Left Thumb)  Patient Location: PACU  Anesthesia Type:General  Level of Consciousness: awake and alert   Airway & Oxygen Therapy: Patient Spontanous Breathing and Patient connected to face mask oxygen  Post-op Assessment: Report given to RN and Post -op Vital signs reviewed and stable  Post vital signs: Reviewed and stable  Last Vitals:  Vitals Value Taken Time  BP 114/76 03/30/19 1556  Temp 35.9 C 03/30/19 1556  Pulse 88 03/30/19 1556  Resp 19 03/30/19 1556  SpO2 96 % 03/30/19 1556  Vitals shown include unvalidated device data.  Last Pain:  Vitals:   03/30/19 1556  PainSc: 0-No pain         Complications: No apparent anesthesia complications

## 2019-03-30 NOTE — Op Note (Signed)
03/30/2019  3:55 PM  PATIENT:  Susan Shah  74 y.o. female  PRE-OPERATIVE DIAGNOSIS:  PAIN IN JOINT OF LEFT HAND. PAIN OF LEFT THUMB. LEFT HAND PAIN. PAIN FROM IMPLANTED HARDWARE.  Subsidence of left thumb CMC joint  POST-OPERATIVE DIAGNOSIS:  PAIN IN JOINT OF LEFT HAND. PAIN OF LEFT THUMB. LEFT HAND PAIN. PAIN FROM IMPLANTED HARDWARE.  Same  PROCEDURE:  Procedure(s): LEFT THUMB SUSPENSION PLASTY (Left)  SURGEON: Laurene Footman, MD  ASSISTANTS: None  ANESTHESIA:   general  EBL:  No intake/output data recorded.  BLOOD ADMINISTERED:none  DRAINS: none   LOCAL MEDICATIONS USED:  MARCAINE     SPECIMEN:  No Specimen  DISPOSITION OF SPECIMEN:  N/A  COUNTS:  YES  TOURNIQUET:   Total Tourniquet Time Documented: Upper Arm (Left) - 50 minutes Total: Upper Arm (Left) - 50 minutes   IMPLANTS: Mini tight rope x1  DICTATION: .Dragon Dictation patient was brought to the operating room and after adequate general anesthesia was obtained the left arm was prepped and draped in the usual sterile fashion with a tourniquet applied the upper arm.  After patient identification and timeout procedures were completed tourniquet was raised.  Prior incision was made over the volar dorsal border of the thumb CMC joint going through the prior scar preserving subcutaneous nerves as much as possible.  The capsule was exposed and the prior implant was exposed and removed cutting off the suture.  The joint was then entered and there was some scar tissue that is calcified this was removed with the aid of the mini C arm.  After adequately removing scar tissue from this area of the thumb could be brought up to a normal position in line with the other metacarpals.  A guidewire was then inserted through the base of the first metacarpal into the distal second metacarpal.  Good visualization was obtained and going through the prior scar the guidewire was pushed through and the tight rope anchor placed.   Following this with a hemostat going around the dorsum of the second metacarpal the prior implant that worked its way through the metacarpal was able to be removed with a suture.  The tight rope on the distal side was then placed through the suture and with the thumb held in traction and abduction the sutures were tightened on the tight rope on the second metacarpal and the metacarpal was quite stable on examination under fluoroscopy.  The sutures were then cut out of the at the second metacarpal 10 cc of half percent Sensorcaine was infiltrated to the area of both incisions.  The wound was irrigated and then Gelfoam placed in the gap where scar tissue been removed to try to provide scar tissue and prevent recurrent subsidence.  The capsule was then closed with an 0 Vicryl followed by 4-0 nylon for the skin both the main incision in the dorsal second metacarpal incision.  Splint with thumb in abduction and extension was applied after dressings of Xeroform 4 x 4 web roll placed followed by an Ace wrap.  Tourniquet let down at the close of the case.  PLAN OF CARE: Discharge to home after PACU  PATIENT DISPOSITION:  PACU - hemodynamically stable.

## 2019-03-31 ENCOUNTER — Ambulatory Visit: Payer: Medicare Other | Admitting: Internal Medicine

## 2019-03-31 NOTE — Anesthesia Preprocedure Evaluation (Addendum)
Anesthesia Evaluation  Patient identified by MRN, date of birth, ID band Patient awake    Reviewed: Allergy & Precautions, H&P , NPO status , Patient's Chart, lab work & pertinent test results, reviewed documented beta blocker date and time   History of Anesthesia Complications (+) PONV and history of anesthetic complications  Airway Mallampati: II  TM Distance: >3 FB Neck ROM: full    Dental  (+) Teeth Intact   Pulmonary neg pulmonary ROS, asthma , sleep apnea , former smoker,    Pulmonary exam normal        Cardiovascular Exercise Tolerance: Poor hypertension, On Medications negative cardio ROS Normal cardiovascular exam Rate:Normal     Neuro/Psych PSYCHIATRIC DISORDERS Anxiety Depression negative neurological ROS  negative psych ROS   GI/Hepatic negative GI ROS, Neg liver ROS, hiatal hernia,   Endo/Other  negative endocrine ROS  Renal/GU negative Renal ROS  negative genitourinary   Musculoskeletal   Abdominal   Peds  Hematology negative hematology ROS (+) Blood dyscrasia, anemia ,   Anesthesia Other Findings   Reproductive/Obstetrics negative OB ROS                            Anesthesia Physical Anesthesia Plan  ASA: III  Anesthesia Plan: General LMA   Post-op Pain Management:    Induction:   PONV Risk Score and Plan:   Airway Management Planned:   Additional Equipment:   Intra-op Plan:   Post-operative Plan:   Informed Consent: I have reviewed the patients History and Physical, chart, labs and discussed the procedure including the risks, benefits and alternatives for the proposed anesthesia with the patient or authorized representative who has indicated his/her understanding and acceptance.       Plan Discussed with: CRNA  Anesthesia Plan Comments:         Anesthesia Quick Evaluation

## 2019-03-31 NOTE — Progress Notes (Signed)
Discuss x-ray results with patient at visit 3/16

## 2019-04-01 ENCOUNTER — Other Ambulatory Visit: Payer: Self-pay

## 2019-04-01 MED ORDER — METOPROLOL TARTRATE 25 MG PO TABS: 25.0000 mg | ORAL_TABLET | Freq: Two times a day (BID) | ORAL | 3 refills | Status: DC

## 2019-04-02 ENCOUNTER — Other Ambulatory Visit: Payer: Self-pay | Admitting: Adult Health

## 2019-04-02 DIAGNOSIS — G250 Essential tremor: Secondary | ICD-10-CM

## 2019-04-06 ENCOUNTER — Ambulatory Visit: Payer: Medicare Other | Admitting: Nurse Practitioner

## 2019-04-14 ENCOUNTER — Telehealth: Payer: Self-pay

## 2019-04-14 NOTE — Telephone Encounter (Signed)
CONFIRMED AND SCREENED FOR 04-19-19 OV.

## 2019-04-19 ENCOUNTER — Ambulatory Visit (INDEPENDENT_AMBULATORY_CARE_PROVIDER_SITE_OTHER): Payer: Medicare Other | Admitting: Nurse Practitioner

## 2019-04-19 ENCOUNTER — Other Ambulatory Visit: Payer: Self-pay

## 2019-04-19 ENCOUNTER — Encounter: Payer: Self-pay | Admitting: Nurse Practitioner

## 2019-04-19 VITALS — BP 130/69 | HR 61 | Temp 97.3°F | Resp 16 | Ht 63.0 in | Wt 180.0 lb

## 2019-04-19 DIAGNOSIS — F32A Depression, unspecified: Secondary | ICD-10-CM

## 2019-04-19 DIAGNOSIS — I10 Essential (primary) hypertension: Secondary | ICD-10-CM | POA: Diagnosis not present

## 2019-04-19 DIAGNOSIS — F329 Major depressive disorder, single episode, unspecified: Secondary | ICD-10-CM | POA: Diagnosis not present

## 2019-04-19 DIAGNOSIS — G4733 Obstructive sleep apnea (adult) (pediatric): Secondary | ICD-10-CM

## 2019-04-19 DIAGNOSIS — G250 Essential tremor: Secondary | ICD-10-CM

## 2019-04-19 NOTE — Progress Notes (Signed)
[facility] [place], [place] [address]  Internal MEDICINE  Office Visit Note  Patient Name: Susan Shah  [id number]  [id number]  Date of Service: 05/02/2019  Chief Complaint  Patient presents with  . Hypertension  . Hyperlipidemia  . Gastroesophageal Reflux  . Arthritis    The patient is here for follow up visit. Had surgery to fix abnormality in her left forearm and hand on 03/30/2019 and continues to heal. Has been very sleepy during the day. Does have sleep apnea and has trouble with the mask irritating her after a few days. Will discuss with pulmonology at next visit, later this week. Blood pressure is well managed.  Has had both Moderna COVID 19 vaccine and these are documented in her immunization record.       Current Medication: Outpatient Encounter Medications as of 04/19/2019  Medication Sig  . acetaminophen (TYLENOL) 500 MG tablet Take 1,000 mg by mouth every 6 (six) hours as needed for moderate pain or headache.  Marland Kitchen amLODipine (NORVASC) 2.5 MG tablet Take 1 tablet (2.5 mg total) by mouth daily.  Marland Kitchen buPROPion (WELLBUTRIN XL) 150 MG 24 hr tablet Take 1 tablet (150 mg total) by mouth every morning.  . Carboxymethylcellul-Glycerin (LUBRICATING EYE DROPS OP) Place 1 drop into both eyes daily as needed (dry eyes).  . cholecalciferol (VITAMIN D3) 25 MCG (1000 UNIT) tablet Take 1,000 Units by mouth daily.  Marland Kitchen escitalopram (LEXAPRO) 10 MG tablet TAKE ONE TABLET BY MOUTH EVERY DAY AT 5-7 PM (Patient not taking: Reported on 04/27/2019)  . fluticasone (FLONASE) 50 MCG/ACT nasal spray Place 1 spray into both nostrils at bedtime as needed for allergies or rhinitis.  Marland Kitchen HYDROcodone-acetaminophen (NORCO) 5-325 MG tablet Take 1 tablet by mouth every 4 (four) hours as needed for moderate pain.  Marland Kitchen loratadine (QC LORATADINE ALLERGY RELIEF) 10 MG tablet Take 1 tablet (10 mg total) by mouth daily.  Marland Kitchen losartan-hydrochlorothiazide (HYZAAR) 100-25 MG tablet Take 1 tablet by  mouth daily. (Patient not taking: Reported on 04/27/2019)  . meclizine (ANTIVERT) 25 MG tablet Take 1 tab po 2 to 3 times a daily as need (Patient taking differently: Take 25 mg by mouth 3 (three) times daily as needed for dizziness. )  . metoprolol tartrate (LOPRESSOR) 25 MG tablet Take 1 tablet (25 mg total) by mouth 2 (two) times daily with a meal.  . NON FORMULARY cpap device  . pantoprazole (PROTONIX) 40 MG tablet Take 1 tablet (40 mg total) by mouth 2 (two) times daily. (Patient taking differently: Take 40 mg by mouth daily. )  . potassium chloride SA (KLOR-CON) 20 MEQ tablet Take 1 tablet (20 mEq total) by mouth daily.  Marland Kitchen rOPINIRole (REQUIP) 0.5 MG tablet TAKE 1 TABLET BY MOUTH TWICE A DAY AS NEEDED  . simvastatin (ZOCOR) 20 MG tablet Take 1 tablet (20 mg total) by mouth daily at 6 PM.  . vitamin B-12 (CYANOCOBALAMIN) 1000 MCG tablet Take 1,000 mcg by mouth daily.  . [DISCONTINUED] oxybutynin (DITROPAN-XL) 10 MG 24 hr tablet Take 2 tablets (20 mg total) by mouth daily.   No facility-administered encounter medications on file as of 04/19/2019.    Surgical History: Past Surgical History:  Procedure Laterality Date  . [place] STUDY N/A 04/24/2016   Procedure: 49 HOUR PH STUDY;  Surgeon: Lucilla Lame, MD;  Location: [facility] ENDOSCOPY;  Service: Endoscopy;  Laterality: N/A;  . ABDOMINAL HYSTERECTOMY     Total  . APPENDECTOMY    . CARPOMETACARPAL (  [place]) FUSION OF THUMB Left 05/16/2016   Procedure: CARPOMETACARPAL [facility]) FUSION OF THUMB;  Surgeon: Hessie Knows, MD;  Location: [facility] ORS;  Service: Orthopedics;  Laterality: Left;  . CARPOMETACARPAL ([place]) FUSION OF THUMB Left 03/30/2019   Procedure: LEFT THUMB SUSPENSION PLASTY;  Surgeon: Hessie Knows, MD;  Location: [facility] ORS;  Service: Orthopedics;  Laterality: Left;  . CATARACT EXTRACTION Bilateral 12/2012  . CHOLECYSTECTOMY    . COLONOSCOPY  2003  . ESOPHAGEAL MANOMETRY N/A 04/24/2016   Procedure: ESOPHAGEAL MANOMETRY (EM);  Surgeon: Lucilla Lame,  MD;  Location: [facility] ENDOSCOPY;  Service: Endoscopy;  Laterality: N/A;  . ESOPHAGOGASTRODUODENOSCOPY  08/2011  . FOOT SURGERY    . HALLUX VALGUS CORRECTION    . HIP SURGERY Right 1950   tendon release r/t polio  . KNEE ARTHROSCOPY Left 06/23/2008  . RETINAL DETACHMENT SURGERY Right 03/2014  . ROTATOR CUFF REPAIR Bilateral   . URINARY SURGERY  2014   [place]    Medical History: Past Medical History:  Diagnosis Date  . Anxiety   . Asthma 2015   mild, seasonal allergy triggered.  . Cancer ([place]) 2013   skin cancer  on left hand  . Depression   . Esophageal dysmotility   . GERD (gastroesophageal reflux disease) 11/05/2012  . History of hiatal hernia   . Hyperlipidemia   . Hypertension   . Incontinence of urine   . Lung nodule   . OA (osteoarthritis)   . Obesity   . PONV (postoperative nausea and vomiting)   . Post-polio syndrome    contracted at 1 months old  . Pulmonary nodule 11/05/2012   RML  . Shingles 2009  . Sleep apnea     Family History: Family History  Problem Relation Age of Onset  . Heart disease Mother   . Heart disease Father   . Heart disease Brother   . Stroke Maternal Grandmother   . Heart disease Maternal Grandfather   . Heart attack Brother     Social History   Socioeconomic History  . Marital status: Married    Spouse name: Not on file  . Number of children: Not on file  . Years of education: Not on file  . Highest education level: Not on file  Occupational History  . Not on file  Tobacco Use  . Smoking status: Former Smoker    Quit date: 05/09/1968    Years since quitting: 51.0  . Smokeless tobacco: Never Used  Substance and Sexual Activity  . Alcohol use: No  . Drug use: No  . Sexual activity: Not on file  Other Topics Concern  . Not on file  Social History Narrative  . Not on file   Social Determinants of Health   Financial Resource Strain:   . Difficulty of Paying Living Expenses:   Food Insecurity:   . Worried About  Charity fundraiser in the Last Year:   . Arboriculturist in the Last Year:   Transportation Needs:   . Film/video editor (Medical):   Marland Kitchen Lack of Transportation (Non-Medical):   Physical Activity:   . Days of Exercise per Week:   . Minutes of Exercise per Session:   Stress:   . Feeling of Stress :   Social Connections:   . Frequency of Communication with Friends and Family:   . Frequency of Social Gatherings with Friends and Family:   . Attends Religious Services:   . Active Member of Clubs or Organizations:   .  Attends Archivist Meetings:   Marland Kitchen Marital Status:   Intimate Partner Violence:   . Fear of Current or Ex-Partner:   . Emotionally Abused:   Marland Kitchen Physically Abused:   . Sexually Abused:       Review of Systems  Constitutional: Positive for fatigue. Negative for appetite change, chills, fever and unexpected weight change.  HENT: Negative for congestion, ear discharge, ear pain, facial swelling, rhinorrhea, sinus pressure, sinus pain, sore throat, trouble swallowing and voice change.   Respiratory: Negative for cough, choking, chest tightness, shortness of breath and wheezing.   Cardiovascular: Negative for chest pain and palpitations.       Blood pressure is well controlled.   Gastrointestinal: Negative for abdominal distention, abdominal pain, blood in stool, constipation, diarrhea, nausea and vomiting.  Endocrine: Negative for cold intolerance, heat intolerance, polydipsia and polyuria.  Musculoskeletal: Positive for gait problem. Negative for arthralgias, back pain, neck pain and neck stiffness.  Skin: Negative for color change, pallor, rash and wound.  Allergic/Immunologic: Positive for environmental allergies.  Neurological: Positive for tremors and weakness. Negative for dizziness, syncope, facial asymmetry, light-headedness, numbness and headaches.  Hematological: Negative for adenopathy.  Psychiatric/Behavioral: Positive for sleep disturbance.    Today's Vitals   04/19/19 1120  BP: 130/69  Pulse: 61  Resp: 16  Temp: (!) 97.3 F (36.3 C)  SpO2: 98%  Weight: 180 lb (81.6 kg)  Height: 5\' 3"  (1.6 m)   Body mass index is 31.89 kg/m.   Physical Exam Vitals and nursing note reviewed.  Constitutional:      General: She is not in acute distress.    Appearance: Normal appearance. She is well-developed. She is not diaphoretic.  HENT:     Head: Normocephalic and atraumatic.     Mouth/Throat:     Pharynx: No oropharyngeal exudate.  Eyes:     General: No scleral icterus.       Right eye: No discharge.        Left eye: No discharge.     Pupils: Pupils are equal, round, and reactive to light.  Neck:     Thyroid: No thyromegaly.     Vascular: No JVD.     Trachea: No tracheal deviation.  Cardiovascular:     Rate and Rhythm: Normal rate and regular rhythm.     Heart sounds: Normal heart sounds. No murmur. No friction rub. No gallop.   Pulmonary:     Effort: Pulmonary effort is normal. No respiratory distress.     Breath sounds: Normal breath sounds. No wheezing or rales.  Chest:     Chest wall: No mass or tenderness.     Breasts: Breasts are symmetrical.        Right: No inverted nipple, mass, nipple discharge, skin change or tenderness.        Left: No inverted nipple, mass, nipple discharge, skin change or tenderness.  Abdominal:     Palpations: Abdomen is soft.  Musculoskeletal:        General: Deformity present. No tenderness. Normal range of motion.     Cervical back: Normal range of motion and neck supple.     Comments: Right leg due to post polio  Lymphadenopathy:     Cervical: No cervical adenopathy.  Skin:    General: Skin is warm and dry.     Capillary Refill: Capillary refill takes less than 2 seconds.     Findings: No erythema or rash.  Neurological:     Mental Status: She  is alert and oriented to person, place, and time. Mental status is at baseline.     Comments: Mild, fine tremor present in bilateral  upper extremities.   Psychiatric:        Mood and Affect: Mood normal.        Behavior: Behavior normal.        Thought Content: Thought content normal.        Judgment: Judgment normal.    Assessment/Plan: 1. Essential hypertension Stable. Continue bp medication as prescribed   2. Benign essential tremor Doing well with ropinirole. Continue as precribed  3. OSA (obstructive sleep apnea) continue with CPAP. Patient to discuss issues with mask with home health provider.   4. Depression, unspecified depression type Stable. Continue bupropion as prescribed   General Counseling: Armya verbalizes understanding of the findings of todays visit and agrees with plan of treatment. I have discussed any further diagnostic evaluation that may be needed or ordered today. We also reviewed her medications today. she has been encouraged to call the office with any questions or concerns that should arise related to todays visit.  This patient was seen by Leretha Pol FNP Collaboration with Dr Lavera Guise as a part of collaborative care agreement  Total time spent: 30 Minutes  Time spent includes review of chart, medications, test results, and follow up plan with the patient.      Dr Lavera Guise Internal medicine

## 2019-04-21 ENCOUNTER — Other Ambulatory Visit: Payer: Self-pay

## 2019-04-21 ENCOUNTER — Ambulatory Visit (INDEPENDENT_AMBULATORY_CARE_PROVIDER_SITE_OTHER): Payer: Medicare Other | Admitting: Internal Medicine

## 2019-04-21 DIAGNOSIS — J4520 Mild intermittent asthma, uncomplicated: Secondary | ICD-10-CM

## 2019-04-21 LAB — PULMONARY FUNCTION TEST

## 2019-04-22 ENCOUNTER — Telehealth: Payer: Self-pay

## 2019-04-22 NOTE — Telephone Encounter (Signed)
Confirmed appointment on 04/27/2019 and screened for covid. klh 

## 2019-04-26 ENCOUNTER — Other Ambulatory Visit: Payer: Self-pay | Admitting: Adult Health

## 2019-04-27 ENCOUNTER — Other Ambulatory Visit: Payer: Self-pay

## 2019-04-27 ENCOUNTER — Encounter: Payer: Self-pay | Admitting: Internal Medicine

## 2019-04-27 ENCOUNTER — Ambulatory Visit: Payer: Medicare Other | Admitting: Occupational Therapy

## 2019-04-27 ENCOUNTER — Ambulatory Visit (INDEPENDENT_AMBULATORY_CARE_PROVIDER_SITE_OTHER): Payer: Medicare Other | Admitting: Internal Medicine

## 2019-04-27 VITALS — BP 136/56 | HR 75 | Temp 97.6°F | Resp 16 | Ht 63.0 in | Wt 178.0 lb

## 2019-04-27 DIAGNOSIS — Z9989 Dependence on other enabling machines and devices: Secondary | ICD-10-CM

## 2019-04-27 DIAGNOSIS — I10 Essential (primary) hypertension: Secondary | ICD-10-CM

## 2019-04-27 DIAGNOSIS — J4520 Mild intermittent asthma, uncomplicated: Secondary | ICD-10-CM | POA: Diagnosis not present

## 2019-04-27 DIAGNOSIS — G4733 Obstructive sleep apnea (adult) (pediatric): Secondary | ICD-10-CM | POA: Diagnosis not present

## 2019-04-27 DIAGNOSIS — K219 Gastro-esophageal reflux disease without esophagitis: Secondary | ICD-10-CM | POA: Diagnosis not present

## 2019-04-27 NOTE — Progress Notes (Signed)
[facility] [place], [place] [address]  Pulmonary Sleep Medicine   Office Visit Note  Patient Name: Susan Shah DOB: [birth year] MRN [id number]  Date of Service: 04/27/2019  Complaints/HPI: Pt is here for pulmonary follow up.  She had a PFT done that is WNL.  She continues to wear cpap for her OSA.  Pt reports good compliance with CPAP therapy. Cleaning machine by hand, and changing filters and tubing as directed. Denies headaches, sinus issues, palpitations, or hemoptysis.    ROS  General: (-) fever, (-) chills, (-) night sweats, (-) weakness Skin: (-) rashes, (-) itching,. Eyes: (-) visual changes, (-) redness, (-) itching. Nose and Sinuses: (-) nasal stuffiness or itchiness, (-) postnasal drip, (-) nosebleeds, (-) sinus trouble. Mouth and Throat: (-) sore throat, (-) hoarseness. Neck: (-) swollen glands, (-) enlarged thyroid, (-) neck pain. Respiratory: - cough, (-) bloody sputum, - shortness of breath, - wheezing. Cardiovascular: - ankle swelling, (-) chest pain. Lymphatic: (-) lymph node enlargement. Neurologic: (-) numbness, (-) tingling. Psychiatric: (-) anxiety, (-) depression   Current Medication: Outpatient Encounter Medications as of 04/27/2019  Medication Sig  . acetaminophen (TYLENOL) 500 MG tablet Take 1,000 mg by mouth every 6 (six) hours as needed for moderate pain or headache.  Marland Kitchen amLODipine (NORVASC) 2.5 MG tablet Take 1 tablet (2.5 mg total) by mouth daily.  Marland Kitchen buPROPion (WELLBUTRIN XL) 150 MG 24 hr tablet Take 1 tablet (150 mg total) by mouth every morning.  . Carboxymethylcellul-Glycerin (LUBRICATING EYE DROPS OP) Place 1 drop into both eyes daily as needed (dry eyes).  . cholecalciferol (VITAMIN D3) 25 MCG (1000 UNIT) tablet Take 1,000 Units by mouth daily.  . fluticasone (FLONASE) 50 MCG/ACT nasal spray Place 1 spray into both nostrils at bedtime as needed for allergies or rhinitis.  Marland Kitchen HYDROcodone-acetaminophen (NORCO) 5-325 MG  tablet Take 1 tablet by mouth every 4 (four) hours as needed for moderate pain.  Marland Kitchen loratadine (QC LORATADINE ALLERGY RELIEF) 10 MG tablet Take 1 tablet (10 mg total) by mouth daily.  . meclizine (ANTIVERT) 25 MG tablet Take 1 tab po 2 to 3 times a daily as need (Patient taking differently: Take 25 mg by mouth 3 (three) times daily as needed for dizziness. )  . metoprolol tartrate (LOPRESSOR) 25 MG tablet Take 1 tablet (25 mg total) by mouth 2 (two) times daily with a meal.  . NON FORMULARY cpap device  . oxybutynin (DITROPAN-XL) 10 MG 24 hr tablet TAKE 2 TABLETS BY MOUTH EVERY DAY  . pantoprazole (PROTONIX) 40 MG tablet Take 1 tablet (40 mg total) by mouth 2 (two) times daily. (Patient taking differently: Take 40 mg by mouth daily. )  . potassium chloride SA (KLOR-CON) 20 MEQ tablet Take 1 tablet (20 mEq total) by mouth daily.  Marland Kitchen rOPINIRole (REQUIP) 0.5 MG tablet TAKE 1 TABLET BY MOUTH TWICE A DAY AS NEEDED  . simvastatin (ZOCOR) 20 MG tablet Take 1 tablet (20 mg total) by mouth daily at 6 PM.  . vitamin B-12 (CYANOCOBALAMIN) 1000 MCG tablet Take 1,000 mcg by mouth daily.  Marland Kitchen escitalopram (LEXAPRO) 10 MG tablet TAKE ONE TABLET BY MOUTH EVERY DAY AT 5-7 PM (Patient not taking: Reported on 04/27/2019)  . losartan-hydrochlorothiazide (HYZAAR) 100-25 MG tablet Take 1 tablet by mouth daily. (Patient not taking: Reported on 04/27/2019)   No facility-administered encounter medications on file as of 04/27/2019.    Surgical History: Past Surgical History:  Procedure Laterality Date  . [place]  STUDY N/A 04/24/2016   Procedure: 80 HOUR PH STUDY;  Surgeon: Lucilla Lame, MD;  Location: [facility] ENDOSCOPY;  Service: Endoscopy;  Laterality: N/A;  . ABDOMINAL HYSTERECTOMY     Total  . APPENDECTOMY    . CARPOMETACARPAL ([place]) FUSION OF THUMB Left 05/16/2016   Procedure: CARPOMETACARPAL [facility]) FUSION OF THUMB;  Surgeon: Hessie Knows, MD;  Location: [facility] ORS;  Service: Orthopedics;  Laterality: Left;  . CARPOMETACARPAL  ([place]) FUSION OF THUMB Left 03/30/2019   Procedure: LEFT THUMB SUSPENSION PLASTY;  Surgeon: Hessie Knows, MD;  Location: [facility] ORS;  Service: Orthopedics;  Laterality: Left;  . CATARACT EXTRACTION Bilateral 12/2012  . CHOLECYSTECTOMY    . COLONOSCOPY  2003  . ESOPHAGEAL MANOMETRY N/A 04/24/2016   Procedure: ESOPHAGEAL MANOMETRY (EM);  Surgeon: Lucilla Lame, MD;  Location: [facility] ENDOSCOPY;  Service: Endoscopy;  Laterality: N/A;  . ESOPHAGOGASTRODUODENOSCOPY  08/2011  . FOOT SURGERY    . HALLUX VALGUS CORRECTION    . HIP SURGERY Right 1950   tendon release r/t polio  . KNEE ARTHROSCOPY Left 06/23/2008  . RETINAL DETACHMENT SURGERY Right 03/2014  . ROTATOR CUFF REPAIR Bilateral   . URINARY SURGERY  2014   [place]    Medical History: Past Medical History:  Diagnosis Date  . Anxiety   . Asthma 2015   mild, seasonal allergy triggered.  . Cancer ([place]) 2013   skin cancer  on left hand  . Depression   . Esophageal dysmotility   . GERD (gastroesophageal reflux disease) 11/05/2012  . History of hiatal hernia   . Hyperlipidemia   . Hypertension   . Incontinence of urine   . Lung nodule   . OA (osteoarthritis)   . Obesity   . PONV (postoperative nausea and vomiting)   . Post-polio syndrome    contracted at 71 months old  . Pulmonary nodule 11/05/2012   RML  . Shingles 2009  . Sleep apnea     Family History: Family History  Problem Relation Age of Onset  . Heart disease Mother   . Heart disease Father   . Heart disease Brother   . Stroke Maternal Grandmother   . Heart disease Maternal Grandfather   . Heart attack Brother     Social History: Social History   Socioeconomic History  . Marital status: Married    Spouse name: Not on file  . Number of children: Not on file  . Years of education: Not on file  . Highest education level: Not on file  Occupational History  . Not on file  Tobacco Use  . Smoking status: Former Smoker    Quit date: 05/09/1968    Years since  quitting: 51.0  . Smokeless tobacco: Never Used  Substance and Sexual Activity  . Alcohol use: No  . Drug use: No  . Sexual activity: Not on file  Other Topics Concern  . Not on file  Social History Narrative  . Not on file   Social Determinants of Health   Financial Resource Strain:   . Difficulty of Paying Living Expenses:   Food Insecurity:   . Worried About Charity fundraiser in the Last Year:   . Arboriculturist in the Last Year:   Transportation Needs:   . Film/video editor (Medical):   Marland Kitchen Lack of Transportation (Non-Medical):   Physical Activity:   . Days of Exercise per Week:   . Minutes of Exercise per Session:   Stress:   . Feeling of Stress :  Social Connections:   . Frequency of Communication with Friends and Family:   . Frequency of Social Gatherings with Friends and Family:   . Attends Religious Services:   . Active Member of Clubs or Organizations:   . Attends Archivist Meetings:   Marland Kitchen Marital Status:   Intimate Partner Violence:   . Fear of Current or Ex-Partner:   . Emotionally Abused:   Marland Kitchen Physically Abused:   . Sexually Abused:     Vital Signs: Blood pressure (!) 136/56, pulse 75, temperature 97.6 F (36.4 C), resp. rate 16, height 5\' 3"  (1.6 m), weight 178 lb (80.7 kg), SpO2 95 %.  Examination: General Appearance: The patient is well-developed, well-nourished, and in no distress. Skin: Gross inspection of skin unremarkable. Head: normocephalic, no gross deformities. Eyes: no gross deformities noted. ENT: ears appear grossly normal no exudates. Neck: Supple. No thyromegaly. No LAD. Respiratory: clear bilaterally. Cardiovascular: Normal S1 and S2 without murmur or rub. Extremities: No cyanosis. pulses are equal. Neurologic: Alert and oriented. No involuntary movements.  LABS: Recent Results (from the past 2160 hour(s))  SARS CORONAVIRUS 2 (TAT 6-24 HRS) Nasopharyngeal Nasopharyngeal Swab     Status: None   Collection Time:  03/26/19 11:36 AM   Specimen: Nasopharyngeal Swab  Result Value Ref Range   SARS Coronavirus 2 NEGATIVE NEGATIVE    Comment: (NOTE) SARS-CoV-2 target nucleic acids are NOT DETECTED. The SARS-CoV-2 RNA is generally detectable in upper and lower respiratory specimens during the acute phase of infection. Negative results do not preclude SARS-CoV-2 infection, do not rule out co-infections with other pathogens, and should not be used as the sole basis for treatment or other patient management decisions. Negative results must be combined with clinical observations, patient history, and epidemiological information. The expected result is Negative. Fact Sheet for Patients: SugarRoll.be Fact Sheet for Healthcare Providers: https://www.woods-mathews.com/ This test is not yet approved or cleared by the [place] FDA and  has been authorized for detection and/or diagnosis of SARS-CoV-2 by FDA under an Emergency Use Authorization (EUA). This EUA will remain  in effect (meaning this test can be used) for the duration of the COVID-19 declaration under Section 56 4(b)(1) of the Act, 21 U.S.C. section 360bbb-3(b)(1), unless the authorization is terminated or revoked sooner. Performed at [facility], [place] [address], [place], [place] [address]     Radiology: No results found.  No results found.  DG Wrist 2 Views Left  Result Date: 03/30/2019 CLINICAL DATA:  Status post left thumb surgery EXAM: LEFT WRIST - 2 VIEW COMPARISON:  None. FINDINGS: Postsurgical changes are noted involving the first and second metacarpal with fixation device in place. Significant degenerative changes of the first [facility] joint are seen. No soft tissue abnormality is noted. IMPRESSION: Postoperative and degenerative changes as described. Electronically Signed   By: Inez Catalina M.D.   On: 03/30/2019 17:40      Assessment and Plan: Patient Active Problem List   Diagnosis  Date Noted  . Cough due to ACE inhibitor 07/10/2018  . Other fatigue 02/16/2018  . Vertigo 02/08/2018  . Screening for breast cancer 12/17/2017  . Flu vaccine need 10/30/2017  . Need for vaccination against Streptococcus pneumoniae using pneumococcal conjugate vaccine 13 10/30/2017  . Hypokalemia 08/28/2017  . Absolute anemia 08/28/2017  . Benign essential tremor 08/28/2017  . Chest pain 08/25/2017  . Depression 03/23/2017  . Hiatal hernia 04/11/2016  . Anxiety   . OA (osteoarthritis)   . Hyperlipidemia   .  Essential hypertension   . Post-polio syndrome   . Lung nodule   . Esophageal dysmotility   . Incontinence of urine   . Obesity   . GERD (gastroesophageal reflux disease) 11/05/2012  . Pulmonary nodule 11/05/2012  . Shingles 01/22/2007    1. OSA on CPAP Continue to use cpap as discussed.    2. Mild intermittent asthma without complication Stable, PFT is normal at this time.   3. Gastroesophageal reflux disease without esophagitis Stable continue Protonix as discussed.   4. Essential hypertension Stable, continue present management.   General Counseling: I have discussed the findings of the evaluation and examination with [facility].  I have also discussed any further diagnostic evaluation thatmay be needed or ordered today. Lake[place]ia verbalizes understanding of the findings of todays visit. We also reviewed her medications today and discussed drug interactions and side effects including but not limited excessive drowsiness and altered mental states. We also discussed that there is always a risk not just to her but also people around her. she has been encouraged to call the office with any questions or concerns that should arise related to todays visit.  No orders of the defined types were placed in this encounter.    Time spent: 25 This patient was seen by Orson Gear AGNP-C in Collaboration with Dr. Devona Konig as a part of collaborative care agreement.   I have personally  obtained a history, examined the patient, evaluated laboratory and imaging results, formulated the assessment and plan and placed orders.    [name], MD [facility] Pulmonary and Critical Care Sleep medicine

## 2019-04-28 ENCOUNTER — Ambulatory Visit: Payer: Medicare Other | Attending: Orthopedic Surgery | Admitting: Occupational Therapy

## 2019-04-28 ENCOUNTER — Encounter: Payer: Self-pay | Admitting: Occupational Therapy

## 2019-04-28 DIAGNOSIS — M6281 Muscle weakness (generalized): Secondary | ICD-10-CM | POA: Diagnosis not present

## 2019-04-28 DIAGNOSIS — R209 Unspecified disturbances of skin sensation: Secondary | ICD-10-CM | POA: Insufficient documentation

## 2019-04-28 DIAGNOSIS — M25642 Stiffness of left hand, not elsewhere classified: Secondary | ICD-10-CM | POA: Diagnosis not present

## 2019-04-28 DIAGNOSIS — M79642 Pain in left hand: Secondary | ICD-10-CM | POA: Diagnosis not present

## 2019-04-28 NOTE — Therapy (Signed)
[place] [facility] [address] [place], [address] Phone: [phone]   Fax:  [phone]  Occupational Therapy Evaluation  Patient Details  Name: Susan Shah MRN: [id number] Date of Birth: 02/25/1945 Referring Provider (OT): Dr Rudene Christians   Encounter Date: 04/28/2019  OT End of Session - 04/28/19 1614    Visit Number  1    Number of Visits  10    Date for OT Re-Evaluation  06/23/19    OT Start Time  1345    OT Stop Time  1445    OT Time Calculation (min)  60 min    Activity Tolerance  Patient tolerated treatment well    Behavior During Therapy  [facility] [place] [facility] for tasks assessed/performed       Past Medical History:  Diagnosis Date  . Anxiety   . Asthma 2015   mild, seasonal allergy triggered.  . Cancer ([place]) 2013   skin cancer  on left hand  . Depression   . Esophageal dysmotility   . GERD (gastroesophageal reflux disease) 11/05/2012  . History of hiatal hernia   . Hyperlipidemia   . Hypertension   . Incontinence of urine   . Lung nodule   . OA (osteoarthritis)   . Obesity   . PONV (postoperative nausea and vomiting)   . Post-polio syndrome    contracted at 69 months old  . Pulmonary nodule 11/05/2012   RML  . Shingles 2009  . Sleep apnea     Past Surgical History:  Procedure Laterality Date  . [place] STUDY N/A 04/24/2016   Procedure: 78 HOUR PH STUDY;  Surgeon: Lucilla Lame, MD;  Location: [facility] ENDOSCOPY;  Service: Endoscopy;  Laterality: N/A;  . ABDOMINAL HYSTERECTOMY     Total  . APPENDECTOMY    . CARPOMETACARPAL ([place]) FUSION OF THUMB Left 05/16/2016   Procedure: CARPOMETACARPAL [facility]) FUSION OF THUMB;  Surgeon: Hessie Knows, MD;  Location: [facility] ORS;  Service: Orthopedics;  Laterality: Left;  . CARPOMETACARPAL ([place]) FUSION OF THUMB Left 03/30/2019   Procedure: LEFT THUMB SUSPENSION PLASTY;  Surgeon: Hessie Knows, MD;  Location: [facility] ORS;  Service: Orthopedics;  Laterality: Left;  . CATARACT EXTRACTION  Bilateral 12/2012  . CHOLECYSTECTOMY    . COLONOSCOPY  2003  . ESOPHAGEAL MANOMETRY N/A 04/24/2016   Procedure: ESOPHAGEAL MANOMETRY (EM);  Surgeon: Lucilla Lame, MD;  Location: [facility] ENDOSCOPY;  Service: Endoscopy;  Laterality: N/A;  . ESOPHAGOGASTRODUODENOSCOPY  08/2011  . FOOT SURGERY    . HALLUX VALGUS CORRECTION    . HIP SURGERY Right 1950   tendon release r/t polio  . KNEE ARTHROSCOPY Left 06/23/2008  . RETINAL DETACHMENT SURGERY Right 03/2014  . ROTATOR CUFF REPAIR Bilateral   . URINARY SURGERY  2014   [place]    There were no vitals filed for this visit.  Subjective Assessment - 04/28/19 1559    Subjective   My L hand started hurting me and I went to see Dr Rudene Christians, thinking I am going to have shot - then xray showed I needed surgery - I took my splint week and 1/2 ago and using my crutches , but still sleep at night time with splint    Pertinent History  Pt had [place] arthroplasty revision 03/30/2019 - she had the first surgery done 05/16/2016 - she has history of polio and uses elbow crutches and has to apply pressure thru her hands - she is 4 wks s/p at eval this date - coming  in with no splint    Patient Stated Goals  I want the pain better and the function/ strength in my L thumb and hand to use for squeezing washcloth, tea pitcher , turn doorknob, cut food    Currently in Pain?  Yes    Pain Score  5     Pain Location  Hand    Pain Orientation  Left    Pain Descriptors / Indicators  Aching;Tender    Pain Type  Surgical pain    Pain Onset  1 to 4 weeks ago    Aggravating Factors   in the morning, and with using it        [facility] OT Assessment - 04/28/19 0001      Assessment   Medical Diagnosis  L CMC arthroplasty , suspention revision     Referring Provider (OT)  Dr Rudene Christians    Onset Date/Surgical Date  03/30/19    Hand Dominance  Right    Next MD Visit  --   21st of April   Prior Therapy  --   19 for original [facility] arthroplasty,  19 for numbness in [place]  Retired    Leisure  R hand dominant - cook, yardwork , read,       AROM   Left Wrist Extension  70 Degrees    Left Wrist Flexion  78 Degrees    Left Wrist Radial Deviation  10 Degrees    Left Wrist Ulnar Deviation  38 Degrees      Left Hand AROM   L Thumb MCP 0-60  20 Degrees    L Thumb IP 0-80  80 Degrees    L Thumb Radial ADduction/ABduction 0-55  --   ADD of CMC and hyper ext of MC 54 degrees    L Thumb Palmar ADduction/ABduction 0-45  38    L Thumb Opposition to Index  --   collapse at [facility] into palm with 4thand 5th , flexion only at        [place] HEP and provided hand out :    Stabilization at thumb with waving of hand away from thumb - on table - 10reps PA of thumb - same height for thumb out of CMC- and at height of 2nd diigit - 10 reps Blocked MC flexion of thumb 10 reps Opposition to all digits with T/C for thumb CMC to stay out - light oval - no squeezing 5-8 reps Quality  More important than Quantity  Fitted with CMC neoprene to use with crutches for support and c          OT Education - 04/28/19 1613    Education Details  findings of eval, HEP and cruthces use    Person(s) Educated  Patient    Methods  Explanation;Demonstration    Comprehension  Verbalized understanding;Returned demonstration       OT Short Term Goals - 04/28/19 1622      OT SHORT TERM GOAL #1   Title  Pain on PRWHE improve with more than 20 points    Baseline  Pain on PRWHE at eval 30/50    Time  4    Period  Weeks    Status  New    Target Date  05/26/19      OT SHORT TERM GOAL #2   Title  AROM for L thumb  MP improve with 10 degrees   and less Hyper extention during PA and RA at [facility] cut with utencils and turn doorknob    Baseline  45 degrees of hyper extnetion during RA, PA has to focus really hard to prevent hyper ext, MC flexion 20    Time  4    Period  Weeks    Status  New    Target Date  05/26/19         OT Long Term Goals - 04/28/19 1626      OT LONG TERM GOAL #1   Title  Pt to be independent in homeprogram to increase AROM and strength in L thumb to use in ADLs symptoms free    Baseline  pain 4-8/10 with use and only using hand bathing and dressing 60%    Time  8    Period  Weeks    Status  New    Target Date  06/23/19      OT LONG TERM GOAL #2   Title  Grip in L hand increase to more than 50% compare to R to carry more than 8 lbs tea pitcher and  squeeze washcloth    Baseline  NT 4 wks s/p    Time  8    Period  Weeks    Status  New    Target Date  06/23/19      OT LONG TERM GOAL #3   Title  L prehension strength increase to more than 50% compare to R hand to carry plate, cut with knife and fork, ty shoes    Baseline  NT 4 wks s/p    Time  8    Period  Weeks    Status  New    Target Date  06/23/19            Plan - 04/28/19 1615    Clinical Impression Statement  Pt present at OT eval 4 wks s/p Revision CMC arthroplasty suspension tight rope - pt arrive using cruches and no splint - she has history of Polio- pt pain in am 5/10 and with acvitiies - but taking pain meds - present with thumb ADDucted at [facility] , and 45 degrees of hyper extention of MC during PA, RA and opposition - decrease strength limiting her functional use of L hand in ADLs and IADL's    OT Occupational Profile and History  Problem Focused Assessment - Including review of records relating to presenting problem    Occupational performance deficits (Please refer to evaluation for details):  ADL's;IADL's;Leisure    Body Structure / Function / Physical Skills  ADL;Flexibility;ROM;UE functional use;Pain;Strength;IADL    Rehab Potential  Fair    Clinical Decision Making  Limited treatment options, no task modification necessary    Comorbidities Affecting Occupational Performance:  May have comorbidities impacting occupational performance   2nd surgery on thumb , use crutches because of polio   Modification  or Assistance to Complete Evaluation   No modification of tasks or assist necessary to complete eval    OT Frequency  --   2 x wks and then 1 x wk   OT Duration  --   8 wks   OT Treatment/Interventions  Self-care/ADL training;Patient/family education;Splinting;Manual Therapy;Paraffin;Fluidtherapy;Passive range of motion;Scar mobilization;Therapeutic exercise    Plan  assess progress with HEP and change HEP as needed    OT Home Exercise Plan  see pt instruction    Consulted and Agree with Plan of Care  Patient  Patient will benefit from skilled therapeutic intervention in order to improve the following deficits and impairments:   Body Structure / Function / Physical Skills: ADL, Flexibility, ROM, UE functional use, Pain, Strength, IADL       Visit Diagnosis: Pain in left hand - Plan: Ot plan of care cert/re-cert  Stiffness of left hand, not elsewhere classified - Plan: Ot plan of care cert/re-cert  Muscle weakness (generalized) - Plan: Ot plan of care cert/re-cert    Problem List Patient Active Problem List   Diagnosis Date Noted  . Cough due to ACE inhibitor 07/10/2018  . Other fatigue 02/16/2018  . Vertigo 02/08/2018  . Screening for breast cancer 12/17/2017  . Flu vaccine need 10/30/2017  . Need for vaccination against Streptococcus pneumoniae using pneumococcal conjugate vaccine 13 10/30/2017  . Hypokalemia 08/28/2017  . Absolute anemia 08/28/2017  . Benign essential tremor 08/28/2017  . Chest pain 08/25/2017  . Depression 03/23/2017  . Hiatal hernia 04/11/2016  . Anxiety   . OA (osteoarthritis)   . Hyperlipidemia   . Essential hypertension   . Post-polio syndrome   . Lung nodule   . Esophageal dysmotility   . Incontinence of urine   . Obesity   . GERD (gastroesophageal reflux disease) 11/05/2012  . Pulmonary nodule 11/05/2012  . Shingles 01/22/2007    Rosalyn Gess  OTR/l,CLT 04/28/2019, 4:31 PM  [place]  [facility] [address], [place], [address] Phone: [phone]   Fax:  [phone]  Name: Susan Shah MRN: [id number] Date of Birth: January 02, 1946

## 2019-04-28 NOTE — Procedures (Signed)
Susan Shah, [id number]  DATE OF SERVICE: April 21, 2019  Complete Pulmonary Function Testing Interpretation:  FINDINGS:  The forced vital capacity is normal.  The FEV1 is normal.  Postbronchodilator no significant change in FEV1.  FEV1 FVC ratio is normal.  Total lung capacity is normal.  Residual volume is normal.  Residual volume total lung capacity ratio is decreased.  The FRC is decreased.  DLCO is normal.  IMPRESSION:  This pulmonary function study is basically within normal limits clinical correlation is recommended.  No significant change after bronchodilators.  DLCO was also normal.  [name], MD [facility] Pulmonary Critical Care Medicine Sleep Medicine

## 2019-04-28 NOTE — Patient Instructions (Signed)
Stabilization at thumb with waving of hand away from thumb - on table - 10reps PA of thumb - same height for thumb out of CMC- and at height of 2nd diigit - 10 reps Blocked MC flexion of thumb 10 reps Opposition to all digits with T/C for thumb CMC to stay out - light oval - no squeezing 5-8 reps Quality  More important than Quantity  Fitted with CMC neoprene to use with crutches for support and cushioning -and pt to not push thru palm and 2nd digit

## 2019-05-02 DIAGNOSIS — Z9989 Dependence on other enabling machines and devices: Secondary | ICD-10-CM | POA: Insufficient documentation

## 2019-05-02 DIAGNOSIS — G4733 Obstructive sleep apnea (adult) (pediatric): Secondary | ICD-10-CM | POA: Insufficient documentation

## 2019-05-02 IMAGING — DX DG CHEST 1V PORT
1 series · 1 of 1 positions shown · non-contrast
Comparison: Chest radiograph performed 02/24/2009

CLINICAL DATA: Acute onset of generalized chest pain.

EXAM:
PORTABLE CHEST 1 VIEW

[chest ap]
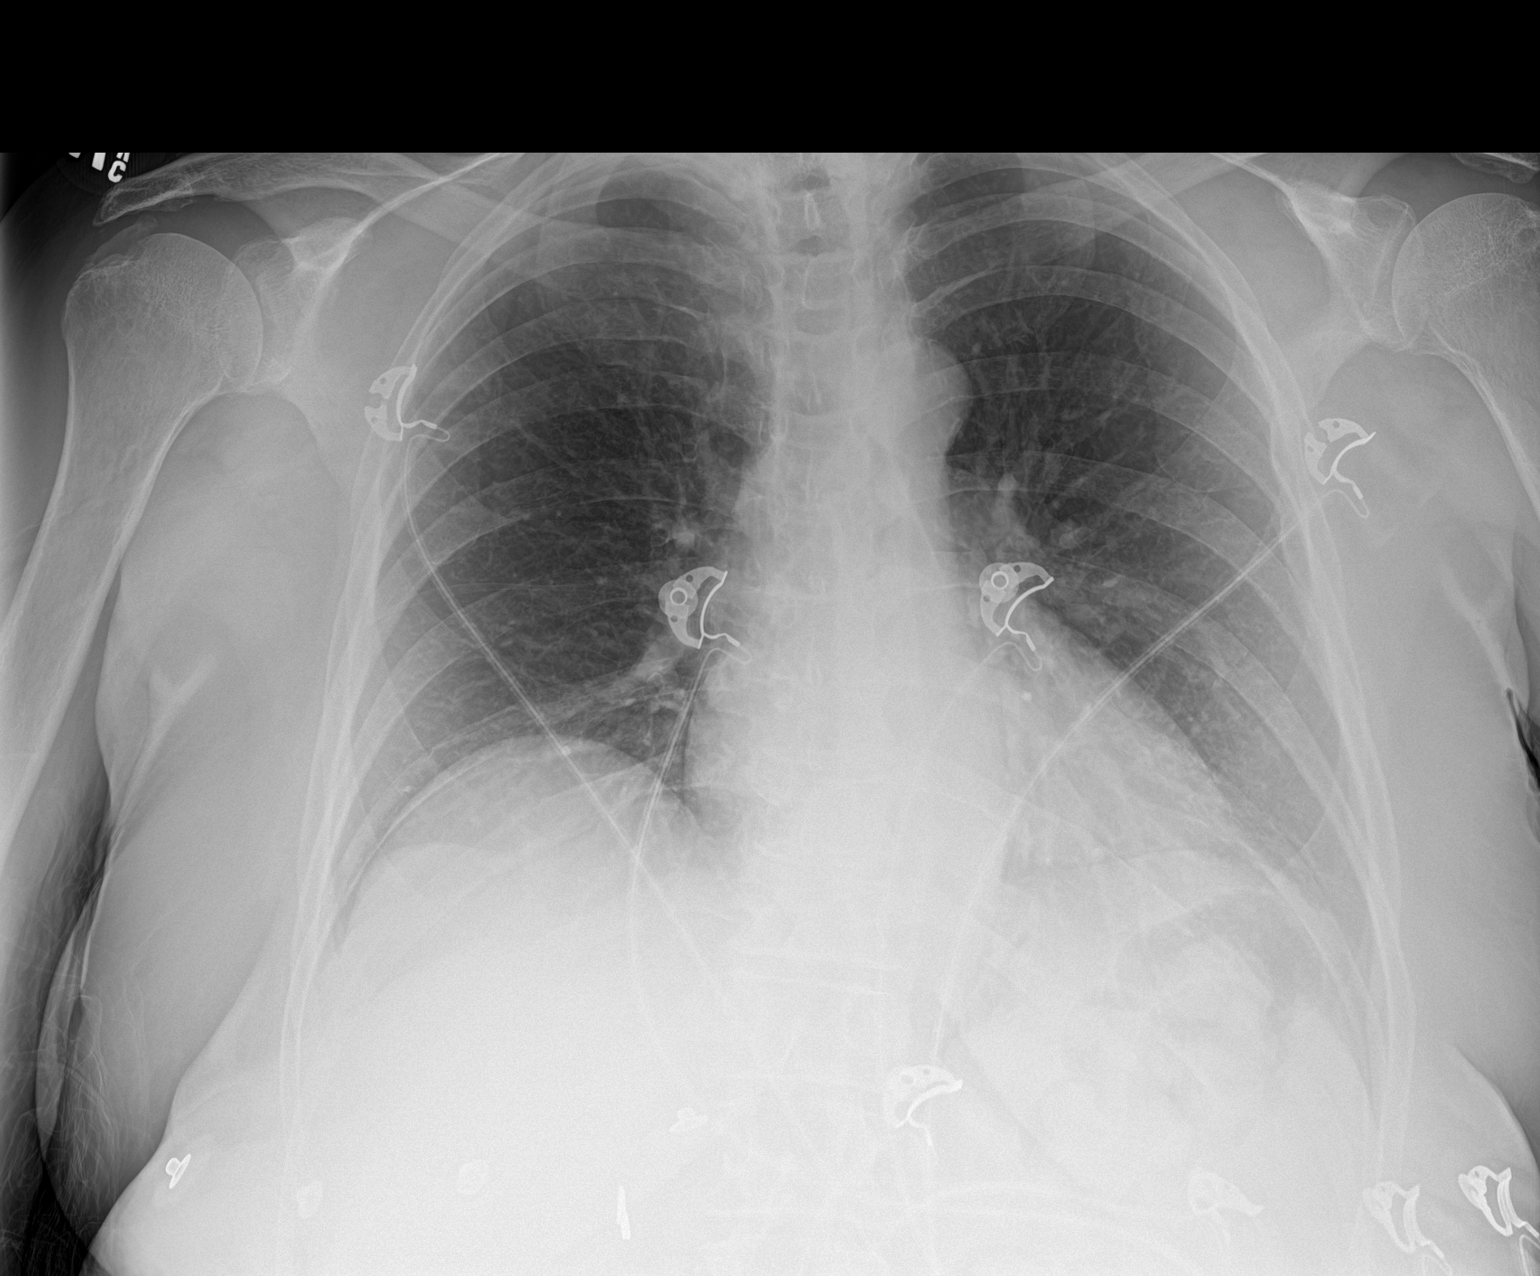

[1 of 1 positions shown; findings below may reference images not displayed]

FINDINGS: The lungs are well-aerated and clear. There is no evidence of focal
opacification, pleural effusion or pneumothorax.

The cardiomediastinal silhouette is within normal limits. No acute
osseous abnormalities are seen.
IMPRESSION: No acute cardiopulmonary process seen.

## 2019-05-04 ENCOUNTER — Ambulatory Visit: Payer: Medicare Other | Admitting: Occupational Therapy

## 2019-05-04 ENCOUNTER — Other Ambulatory Visit: Payer: Self-pay

## 2019-05-04 DIAGNOSIS — M6281 Muscle weakness (generalized): Secondary | ICD-10-CM

## 2019-05-04 DIAGNOSIS — M25642 Stiffness of left hand, not elsewhere classified: Secondary | ICD-10-CM

## 2019-05-04 DIAGNOSIS — R209 Unspecified disturbances of skin sensation: Secondary | ICD-10-CM | POA: Diagnosis not present

## 2019-05-04 DIAGNOSIS — R208 Other disturbances of skin sensation: Secondary | ICD-10-CM

## 2019-05-04 DIAGNOSIS — M79642 Pain in left hand: Secondary | ICD-10-CM | POA: Diagnosis not present

## 2019-05-04 NOTE — Patient Instructions (Signed)
See note

## 2019-05-04 NOTE — Therapy (Signed)
[facility] [address] [place], [address] Phone: [phone]   Fax:  [phone]  Occupational Therapy Treatment  Patient Details  Name: Susan Shah MRN: [id number] Date of Birth: [birth year] Referring Provider (OT): Dr Rudene Christians   Encounter Date: 05/04/2019  OT End of Session - 05/04/19 1629    Visit Number  2    Number of Visits  10    Date for OT Re-Evaluation  06/23/19    OT Start Time  1445    OT Stop Time  1517    OT Time Calculation (min)  32 min    Activity Tolerance  Patient tolerated treatment well    Behavior During Therapy  [facility] for tasks assessed/performed       Past Medical History:  Diagnosis Date  . Anxiety   . Asthma 2015   mild, seasonal allergy triggered.  . Cancer ([place]) 2013   skin cancer  on left hand  . Depression   . Esophageal dysmotility   . GERD (gastroesophageal reflux disease) 11/05/2012  . History of hiatal hernia   . Hyperlipidemia   . Hypertension   . Incontinence of urine   . Lung nodule   . OA (osteoarthritis)   . Obesity   . PONV (postoperative nausea and vomiting)   . Post-polio syndrome    contracted at 71 months old  . Pulmonary nodule 11/05/2012   RML  . Shingles 2009  . Sleep apnea     Past Surgical History:  Procedure Laterality Date  . [place] STUDY N/A 04/24/2016   Procedure: 71 HOUR PH STUDY;  Surgeon: Lucilla Lame, MD;  Location: [facility] ENDOSCOPY;  Service: Endoscopy;  Laterality: N/A;  . ABDOMINAL HYSTERECTOMY     Total  . APPENDECTOMY    . CARPOMETACARPAL ([place]) FUSION OF THUMB Left 05/16/2016   Procedure: CARPOMETACARPAL [facility]) FUSION OF THUMB;  Surgeon: Hessie Knows, MD;  Location: [facility] ORS;  Service: Orthopedics;  Laterality: Left;  . CARPOMETACARPAL ([place]) FUSION OF THUMB Left 03/30/2019   Procedure: LEFT THUMB SUSPENSION PLASTY;  Surgeon: Hessie Knows, MD;  Location: [facility] ORS;  Service: Orthopedics;  Laterality: Left;  . CATARACT EXTRACTION  Bilateral 12/2012  . CHOLECYSTECTOMY    . COLONOSCOPY  2003  . ESOPHAGEAL MANOMETRY N/A 04/24/2016   Procedure: ESOPHAGEAL MANOMETRY (EM);  Surgeon: Lucilla Lame, MD;  Location: [facility] ENDOSCOPY;  Service: Endoscopy;  Laterality: N/A;  . ESOPHAGOGASTRODUODENOSCOPY  08/2011  . FOOT SURGERY    . HALLUX VALGUS CORRECTION    . HIP SURGERY Right 1950   tendon release r/t polio  . KNEE ARTHROSCOPY Left 06/23/2008  . RETINAL DETACHMENT SURGERY Right 03/2014  . ROTATOR CUFF REPAIR Bilateral   . URINARY SURGERY  2014   [place]    There were no vitals filed for this visit.  Subjective Assessment - 05/04/19 1628    Subjective   Doing okay- has some pain at times in my thumb - about 5/10 - taking some tylenol - using my crutches - did my exercises    Pertinent History  Pt had CMC arthroplasty revision 03/30/2019 - she had the first surgery done 05/16/2016 - she has history of polio and uses elbow crutches and has to apply pressure thru her hands - she is 4 wks s/p at eval this date - coming in with no splint    Patient Stated Goals  I want the pain better and the function/ strength in my L thumb  and hand to use for squeezing washcloth, tea pitcher , turn doorknob, cut food    Currently in Pain?  Yes    Pain Score  3     Pain Location  Hand    Pain Orientation  Left    Pain Descriptors / Indicators  Aching;Tender    Pain Type  Surgical pain    Pain Onset  1 to 4 weeks ago    Pain Frequency  Intermittent         [facility] OT Assessment - 05/04/19 0001      Left Hand AROM   L Thumb MCP 0-60  25 Degrees    L Thumb Palmar ADduction/ABduction 0-45  60       Opposition able to do 2nd and 3rd with less collapsing of CMC into palm - able to keep stabilize but mostly doing IP flexion and MC neutral   assess HEP from last time progress and upgrade as needed provided hand out :  Stabilization at thumb with waving of hand away from thumb - on table - 10reps -still hyper extention of MC - pt to do  kinesiotape around [facility] and achor at radial wrist   PA of thumb - same height for thumb out of CMC- and at height of 2nd diigit - 10 reps - able to keep [place] neutral -add this date rubber band 8-10 reps   Blocked MC flexion of thumb 10 reps review and pt to keep same  Opposition to all digits with T/C for thumb CMC to stay out - light oval - no squeezing 5-8 reps -add this date also kinesiotape around [facility] and achor ends at radial wrist to cue [place] out of palm during opposition   Quality  More important than Quantity  Fitted  Last time with [facility] neoprene to use with crutches for support and during activities that cause pain                OT Education - 05/04/19 1629    Education Details  progress, HEP and cruthces use    Person(s) Educated  Patient    Methods  Explanation;Demonstration    Comprehension  Verbalized understanding;Returned demonstration       OT Short Term Goals - 04/28/19 1622      OT SHORT TERM GOAL #1   Title  Pain on PRWHE improve with more than 20 points    Baseline  Pain on PRWHE at eval 30/50    Time  4    Period  Weeks    Status  New    Target Date  05/26/19      OT SHORT TERM GOAL #2   Title  AROM for L thumb MP improve with 10 degrees   and less Hyper extention during PA and RA at [facility] cut with utencils and turn doorknob    Baseline  45 degrees of hyper extnetion during RA, PA has to focus really hard to prevent hyper ext, MC flexion 20    Time  4    Period  Weeks    Status  New    Target Date  05/26/19        OT Long Term Goals - 04/28/19 1626      OT LONG TERM GOAL #1   Title  Pt to be independent in homeprogram to increase AROM and strength in L thumb to use in ADLs symptoms free    Baseline  pain 4-8/10 with use and only using hand bathing  and dressing 60%    Time  8    Period  Weeks    Status  New    Target Date  06/23/19      OT LONG TERM GOAL #2   Title  Grip in L hand increase to more than 50% compare to R to carry more than 8  lbs tea pitcher and  squeeze washcloth    Baseline  NT 4 wks s/p    Time  8    Period  Weeks    Status  New    Target Date  06/23/19      OT LONG TERM GOAL #3   Title  L prehension strength increase to more than 50% compare to R hand to carry plate, cut with knife and fork, ty shoes    Baseline  NT 4 wks s/p    Time  8    Period  Weeks    Status  New    Target Date  06/23/19            Plan - 05/04/19 1630    Clinical Impression Statement  Pt is 5 wks s/p Revision of CMC arthroplasty suspension tight rope - pt using CMC neoprene splint on crutches - did show increase MC flexion ,and PA without hyper extention of MC and ADD of CMC - but has to focus hard to do so - increase from 38 to 60 degrees but cont to sohw 45 degrees of hyper extention of MC during PA and RA , opposition and colapsing of CMC into palm    OT Occupational Profile and History  Problem Focused Assessment - Including review of records relating to presenting problem    Occupational performance deficits (Please refer to evaluation for details):  ADL's;IADL's;Leisure    Body Structure / Function / Physical Skills  ADL;Flexibility;ROM;UE functional use;Pain;Strength;IADL    Rehab Potential  Fair    Clinical Decision Making  Limited treatment options, no task modification necessary    Comorbidities Affecting Occupational Performance:  May have comorbidities impacting occupational performance    Modification or Assistance to Complete Evaluation   No modification of tasks or assist necessary to complete eval    OT Frequency  2x / week    OT Duration  8 weeks    OT Treatment/Interventions  Self-care/ADL training;Patient/family education;Splinting;Manual Therapy;Paraffin;Fluidtherapy;Passive range of motion;Scar mobilization;Therapeutic exercise    Plan  assess progress with HEP and change HEP as needed    OT Home Exercise Plan  see pt instruction    Consulted and Agree with Plan of Care  Patient       Patient will  benefit from skilled therapeutic intervention in order to improve the following deficits and impairments:   Body Structure / Function / Physical Skills: ADL, Flexibility, ROM, UE functional use, Pain, Strength, IADL       Visit Diagnosis: Stiffness of left hand, not elsewhere classified  Muscle weakness (generalized)  Other disturbances of skin sensation  Pain in left hand    Problem List Patient Active Problem List   Diagnosis Date Noted  . OSA (obstructive sleep apnea) 05/02/2019  . Cough due to ACE inhibitor 07/10/2018  . Other fatigue 02/16/2018  . Vertigo 02/08/2018  . Screening for breast cancer 12/17/2017  . Flu vaccine need 10/30/2017  . Need for vaccination against Streptococcus pneumoniae using pneumococcal conjugate vaccine 13 10/30/2017  . Hypokalemia 08/28/2017  . Absolute anemia 08/28/2017  . Benign essential tremor 08/28/2017  . Chest pain 08/25/2017  .  Depression 03/23/2017  . Hiatal hernia 04/11/2016  . Anxiety   . OA (osteoarthritis)   . Hyperlipidemia   . Essential hypertension   . Post-polio syndrome   . Lung nodule   . Esophageal dysmotility   . Incontinence of urine   . Obesity   . GERD (gastroesophageal reflux disease) 11/05/2012  . Pulmonary nodule 11/05/2012  . Shingles 01/22/2007    Rosalyn Gess OTR/L,CLT 05/04/2019, 4:37 PM  [facility] [address], [place], [address] Phone: [phone]   Fax:  [phone]  Name: Susan Shah MRN: [id number] Date of Birth: 12/05/1945

## 2019-05-06 ENCOUNTER — Other Ambulatory Visit: Payer: Self-pay

## 2019-05-06 ENCOUNTER — Ambulatory Visit: Payer: Medicare Other | Admitting: Occupational Therapy

## 2019-05-06 DIAGNOSIS — M6281 Muscle weakness (generalized): Secondary | ICD-10-CM

## 2019-05-06 DIAGNOSIS — M25642 Stiffness of left hand, not elsewhere classified: Secondary | ICD-10-CM | POA: Diagnosis not present

## 2019-05-06 DIAGNOSIS — M79642 Pain in left hand: Secondary | ICD-10-CM | POA: Diagnosis not present

## 2019-05-06 DIAGNOSIS — R208 Other disturbances of skin sensation: Secondary | ICD-10-CM

## 2019-05-06 DIAGNOSIS — R209 Unspecified disturbances of skin sensation: Secondary | ICD-10-CM | POA: Diagnosis not present

## 2019-05-06 NOTE — Patient Instructions (Signed)
same

## 2019-05-06 NOTE — Therapy (Signed)
[facility] [address] [place], [address] Phone: [phone]   Fax:  [phone]  Occupational Therapy Treatment  Patient Details  Name: Susan Shah MRN: [id number] Date of Birth: [birth year] Referring Provider (OT): Dr Rudene Christians   Encounter Date: 05/06/2019  OT End of Session - 05/06/19 1137    Visit Number  3    Number of Visits  10    Date for OT Re-Evaluation  06/23/19    OT Start Time  1130    OT Stop Time  1202    OT Time Calculation (min)  32 min    Activity Tolerance  Patient tolerated treatment well    Behavior During Therapy  [facility] for tasks assessed/performed       Past Medical History:  Diagnosis Date  . Anxiety   . Asthma 2015   mild, seasonal allergy triggered.  . Cancer ([place]) 2013   skin cancer  on left hand  . Depression   . Esophageal dysmotility   . GERD (gastroesophageal reflux disease) 11/05/2012  . History of hiatal hernia   . Hyperlipidemia   . Hypertension   . Incontinence of urine   . Lung nodule   . OA (osteoarthritis)   . Obesity   . PONV (postoperative nausea and vomiting)   . Post-polio syndrome    contracted at 41 months old  . Pulmonary nodule 11/05/2012   RML  . Shingles 2009  . Sleep apnea     Past Surgical History:  Procedure Laterality Date  . [place] STUDY N/A 04/24/2016   Procedure: 36 HOUR PH STUDY;  Surgeon: Lucilla Lame, MD;  Location: [facility] END[facility];  Service: Endoscopy;  Laterality: N/A;  . ABDOMINAL HYSTERECTOMY     Total  . APPENDECTOMY    . CARPOMETACARPAL ([place]) FUSION OF THUMB Left 05/16/2016   Procedure: CARPOMETACARPAL [facility]) FUSION OF THUMB;  Surgeon: Hessie Knows, MD;  Location: [facility] ORS;  Service: Orthopedics;  Laterality: Left;  . CARPOMETACARPAL ([place]) FUSION OF THUMB Left 03/30/2019   Procedure: LEFT THUMB SUSPENSION PLASTY;  Surgeon: Hessie Knows, MD;  Location: [facility] ORS;  Service: Orthopedics;  Laterality: Left;  . CATARACT EXTRACTION  Bilateral 12/2012  . CHOLECYSTECTOMY    . COLONOSCOPY  2003  . ESOPHAGEAL MANOMETRY N/A 04/24/2016   Procedure: ESOPHAGEAL MANOMETRY (EM);  Surgeon: Lucilla Lame, MD;  Location: [facility] END[facility];  Service: Endoscopy;  Laterality: N/A;  . ESOPHAGOGASTRODUODENOSCOPY  08/2011  . FOOT SURGERY    . HALLUX VALGUS CORRECTION    . HIP SURGERY Right 1950   tendon release r/t polio  . KNEE ARTHROSCOPY Left 06/23/2008  . RETINAL DETACHMENT SURGERY Right 03/2014  . ROTATOR CUFF REPAIR Bilateral   . URINARY SURGERY  2014   [place]    There were no vitals filed for this visit.  Subjective Assessment - 05/06/19 1135    Subjective   My thumb was hurting little more after last time - about 3/10 - but was better this morning    Pertinent History  Pt had CMC arthroplasty revision 03/30/2019 - she had the first surgery done 05/16/2016 - she has history of polio and uses elbow crutches and has to apply pressure thru her hands - she is 4 wks s/p at eval this date - coming in with no splint    Patient Stated Goals  I want the pain better and the function/ strength in my L thumb and hand to use for squeezing washcloth,  tea pitcher , turn doorknob, cut food    Currently in Pain?  Yes    Pain Score  2     Pain Location  Hand    Pain Orientation  Left    Pain Descriptors / Indicators  Aching;Tender    Pain Type  Surgical pain    Pain Onset  1 to 4 weeks ago    Pain Frequency  Intermittent       Pt report has some pain after she left last time- 3/10 - did not do to much HEP since last time              OT Treatments/Exercises (OP) - 05/06/19 0001      LUE Paraffin   Number Minutes Paraffin  8 Minutes    LUE Paraffin Location  Hand    Comments  prior to soft tissue and decrease pain      to decrease pain - decrease to 1/10 but also taking painmeds    Opposition able to do 2nd and 3rd with less collapsing of CMC into palm - able to keep stabilize but mostly doing IP flexion and MC neutral - add  4th and 5th - but focus no pressure and keeping CMC out of palm assess HEP from last time progress and upgrade as needed provided hand out :  Stabilization at thumb with waving of hand away from thumb - on table -  4 x 3 reps at time  -still hyper extention of MC at times  - pt to do kinesiotape around [facility] and achor at radial wrist   PA of thumb - same height for thumb out of CMC- and at height of 2nd diigit - 3 reps at time - x 4  - able to keep [place] neutral - no resistance   Blocked MC flexion of thumb 10 reps review and pt to keep same  Opposition to all digits with T/C for thumb CMC to stay out - light oval - no squeezing 5-8 reps -add this date also kinesiotape around [facility] and achor ends at radial wrist to cue [place] out of palm during opposition   Quality More important than Quantity  Fitted  Last time with [facility] neoprene to use with crutches for support and during activities that cause pain           OT Education - 05/06/19 1137    Education Details  progress, HEP and cruthces use    Person(s) Educated  Patient    Methods  Explanation;Demonstration    Comprehension  Verbalized understanding;Returned demonstration       OT Short Term Goals - 04/28/19 1622      OT SHORT TERM GOAL #1   Title  Pain on PRWHE improve with more than 20 points    Baseline  Pain on PRWHE at eval 30/50    Time  4    Period  Weeks    Status  New    Target Date  05/26/19      OT SHORT TERM GOAL #2   Title  AROM for L thumb MP improve with 10 degrees   and less Hyper extention during PA and RA at [facility] cut with utencils and turn doorknob    Baseline  45 degrees of hyper extnetion during RA, PA has to focus really hard to prevent hyper ext, MC flexion 20    Time  4    Period  Weeks    Status  New  Target Date  05/26/19        OT Long Term Goals - 04/28/19 1626      OT LONG TERM GOAL #1   Title  Pt to be independent in homeprogram to increase AROM and strength in L thumb to use in ADLs  symptoms free    Baseline  pain 4-8/10 with use and only using hand bathing and dressing 60%    Time  8    Period  Weeks    Status  New    Target Date  06/23/19      OT LONG TERM GOAL #2   Title  Grip in L hand increase to more than 50% compare to R to carry more than 8 lbs tea pitcher and  squeeze washcloth    Baseline  NT 4 wks s/p    Time  8    Period  Weeks    Status  New    Target Date  06/23/19      OT LONG TERM GOAL #3   Title  L prehension strength increase to more than 50% compare to R hand to carry plate, cut with knife and fork, ty shoes    Baseline  NT 4 wks s/p    Time  8    Period  Weeks    Status  New    Target Date  06/23/19            Plan - 05/06/19 1138    Clinical Impression Statement  Pt is 5 1/2 wks s/p Revision of CMC arthroplasty/suspension of tight rope - pt use CMC neoprene splint with crutches - pt show increase MC flexion and able to do opposition to digits softly without collapsing past midline in palm , can do 3 reps at time for PA and stabilization of thumb without hyper extention of MC , collapsing of thumb into palm    OT Occupational Profile and History  Problem Focused Assessment - Including review of records relating to presenting problem    Occupational performance deficits (Please refer to evaluation for details):  ADL's;IADL's;Leisure    Body Structure / Function / Physical Skills  ADL;Flexibility;ROM;UE functional use;Pain;Strength;IADL    Rehab Potential  Fair    Clinical Decision Making  Limited treatment options, no task modification necessary    Comorbidities Affecting Occupational Performance:  May have comorbidities impacting occupational performance    Modification or Assistance to Complete Evaluation   No modification of tasks or assist necessary to complete eval    OT Frequency  2x / week    OT Duration  8 weeks    OT Treatment/Interventions  Self-care/ADL training;Patient/family education;Splinting;Manual  Therapy;Paraffin;Fluidtherapy;Passive range of motion;Scar mobilization;Therapeutic exercise    Plan  assess progress with HEP and change HEP as needed    OT Home Exercise Plan  see pt instruction    Consulted and Agree with Plan of Care  Patient       Patient will benefit from skilled therapeutic intervention in order to improve the following deficits and impairments:   Body Structure / Function / Physical Skills: ADL, Flexibility, ROM, UE functional use, Pain, Strength, IADL       Visit Diagnosis: Stiffness of left hand, not elsewhere classified  Muscle weakness (generalized)  Other disturbances of skin sensation  Pain in left hand    Problem List Patient Active Problem List   Diagnosis Date Noted  . OSA (obstructive sleep apnea) 05/02/2019  . Cough due to ACE inhibitor 07/10/2018  .  Other fatigue 02/16/2018  . Vertigo 02/08/2018  . Screening for breast cancer 12/17/2017  . Flu vaccine need 10/30/2017  . Need for vaccination against Streptococcus pneumoniae using pneumococcal conjugate vaccine 13 10/30/2017  . Hypokalemia 08/28/2017  . Absolute anemia 08/28/2017  . Benign essential tremor 08/28/2017  . Chest pain 08/25/2017  . Depression 03/23/2017  . Hiatal hernia 04/11/2016  . Anxiety   . OA (osteoarthritis)   . Hyperlipidemia   . Essential hypertension   . Post-polio syndrome   . Lung nodule   . Esophageal dysmotility   . Incontinence of urine   . Obesity   . GERD (gastroesophageal reflux disease) 11/05/2012  . Pulmonary nodule 11/05/2012  . Shingles 01/22/2007    Rosalyn Gess OTR/l,CLT 05/06/2019, 12:08 PM  [facility] [address] [place], [address] Phone: [phone]   Fax:  [phone]  Name: Susan Shah MRN: [id number] Date of Birth: 05/03/1945

## 2019-05-11 ENCOUNTER — Ambulatory Visit: Payer: Medicare Other | Admitting: Occupational Therapy

## 2019-05-11 ENCOUNTER — Other Ambulatory Visit: Payer: Self-pay

## 2019-05-11 DIAGNOSIS — M79642 Pain in left hand: Secondary | ICD-10-CM | POA: Diagnosis not present

## 2019-05-11 DIAGNOSIS — M25642 Stiffness of left hand, not elsewhere classified: Secondary | ICD-10-CM

## 2019-05-11 DIAGNOSIS — M6281 Muscle weakness (generalized): Secondary | ICD-10-CM

## 2019-05-11 DIAGNOSIS — R209 Unspecified disturbances of skin sensation: Secondary | ICD-10-CM | POA: Diagnosis not present

## 2019-05-11 DIAGNOSIS — R208 Other disturbances of skin sensation: Secondary | ICD-10-CM

## 2019-05-11 NOTE — Patient Instructions (Signed)
See note

## 2019-05-11 NOTE — Therapy (Signed)
[facility] [address] [place], [address] Phone: [phone]   Fax:  [phone]  Occupational Therapy Treatment  Patient Details  Name: Susan Shah MRN: [id number] Date of Birth: 05/07/1945 Referring Provider (OT): Dr Rudene Christians   Encounter Date: 05/11/2019  OT End of Session - 05/11/19 1629    Visit Number  4    Number of Visits  10    Date for OT Re-Evaluation  06/23/19    OT Start Time  1535    OT Stop Time  1625    OT Time Calculation (min)  50 min    Activity Tolerance  Patient tolerated treatment well    Behavior During Therapy  [facility] for tasks assessed/performed       Past Medical History:  Diagnosis Date  . Anxiety   . Asthma 2015   mild, seasonal allergy triggered.  . Cancer ([place]) 2013   skin cancer  on left hand  . Depression   . Esophageal dysmotility   . GERD (gastroesophageal reflux disease) 11/05/2012  . History of hiatal hernia   . Hyperlipidemia   . Hypertension   . Incontinence of urine   . Lung nodule   . OA (osteoarthritis)   . Obesity   . PONV (postoperative nausea and vomiting)   . Post-polio syndrome    contracted at 72 months old  . Pulmonary nodule 11/05/2012   RML  . Shingles 2009  . Sleep apnea     Past Surgical History:  Procedure Laterality Date  . [place] STUDY N/A 04/24/2016   Procedure: 15 HOUR PH STUDY;  Surgeon: Lucilla Lame, MD;  Location: [facility] ENDOSCOPY;  Service: Endoscopy;  Laterality: N/A;  . ABDOMINAL HYSTERECTOMY     Total  . APPENDECTOMY    . CARPOMETACARPAL ([place]) FUSION OF THUMB Left 05/16/2016   Procedure: CARPOMETACARPAL [facility]) FUSION OF THUMB;  Surgeon: Hessie Knows, MD;  Location: [facility] ORS;  Service: Orthopedics;  Laterality: Left;  . CARPOMETACARPAL ([place]) FUSION OF THUMB Left 03/30/2019   Procedure: LEFT THUMB SUSPENSION PLASTY;  Surgeon: Hessie Knows, MD;  Location: [facility] ORS;  Service: Orthopedics;  Laterality: Left;  . CATARACT EXTRACTION  Bilateral 12/2012  . CHOLECYSTECTOMY    . COLONOSCOPY  2003  . ESOPHAGEAL MANOMETRY N/A 04/24/2016   Procedure: ESOPHAGEAL MANOMETRY (EM);  Surgeon: Lucilla Lame, MD;  Location: [facility] ENDOSCOPY;  Service: Endoscopy;  Laterality: N/A;  . ESOPHAGOGASTRODUODENOSCOPY  08/2011  . FOOT SURGERY    . HALLUX VALGUS CORRECTION    . HIP SURGERY Right 1950   tendon release r/t polio  . KNEE ARTHROSCOPY Left 06/23/2008  . RETINAL DETACHMENT SURGERY Right 03/2014  . ROTATOR CUFF REPAIR Bilateral   . URINARY SURGERY  2014   [place]    There were no vitals filed for this visit.  Subjective Assessment - 05/11/19 1628    Subjective   I don't have pain today- I am trying to play around with my grip on my crutches    Pertinent History  Pt had CMC arthroplasty revision 03/30/2019 - she had the first surgery done 05/16/2016 - she has history of polio and uses elbow crutches and has to apply pressure thru her hands - she is 4 wks s/p at eval this date - coming in with no splint    Patient Stated Goals  I want the pain better and the function/ strength in my L thumb and hand to use for squeezing washcloth, tea  pitcher , turn doorknob, cut food    Currently in Pain?  No/denies         [facility] OT Assessment - 05/11/19 0001      Strength   Right Hand Lateral Pinch  10 lbs    Right Hand 3 Point Pinch  14 lbs    Left Hand Lateral Pinch  7 lbs   hyper extention of MC    Left Hand 3 Point Pinch  8 lbs      Left Hand AROM   L Thumb MCP 0-60  35 Degrees    L Thumb Palmar ADduction/ABduction 0-45  60        Pt report  decrease pain coming in - trying to pay attention how she use her crutches   Assess progress with HEP  Opposition able to do 2nd thru 5th with less collapsing of CMC into palm - but focus on keeping CMC out of palm Stabilization at thumb with waving of hand away from thumb - on table -  4 x 3 reps at time -still hyper extention of MC at times  - pt to do kinesiotape around [facility] and achor at  radial wrist - kept same HEP  PA of thumb - same height for thumb out of [place]- and at height of 2nd diigit - great progress - and able this date to add rubber band for strengthening  Could carry over to 3 point pinch into putty - with stabilization of thumb at PA  To do light pinch - 3 point - 12 reps into short snake  Increase Friday if no issues to 2 sets   Blocked MC flexion of thumb 10 repsreview and pt to keep same  Can do lat grip - but with [facility] arthroplasty on - light - decrease hyper extention of MC with neoprene on -  12 reps into short snake -and increase 2nd set Friday if no issues  Quality More important than Quantity  Fitted at eval with  CMC neoprene to use with crutches for supportand during activities that cause pain                  OT Education - 05/11/19 1629    Education Details  progress, HEP and cruthces use    Person(s) Educated  Patient    Methods  Explanation;Demonstration    Comprehension  Verbalized understanding;Returned demonstration       OT Short Term Goals - 04/28/19 1622      OT SHORT TERM GOAL #1   Title  Pain on PRWHE improve with more than 20 points    Baseline  Pain on PRWHE at eval 30/50    Time  4    Period  Weeks    Status  New    Target Date  05/26/19      OT SHORT TERM GOAL #2   Title  AROM for L thumb MP improve with 10 degrees   and less Hyper extention during PA and RA at [facility] cut with utencils and turn doorknob    Baseline  45 degrees of hyper extnetion during RA, PA has to focus really hard to prevent hyper ext, MC flexion 20    Time  4    Period  Weeks    Status  New    Target Date  05/26/19        OT Long Term Goals - 04/28/19 1626      OT LONG TERM GOAL #1   Title  Pt to be independent in homeprogram to increase AROM and strength in L thumb to use in ADLs symptoms free    Baseline  pain 4-8/10 with use and only using hand bathing and dressing 60%    Time  8    Period  Weeks    Status  New     Target Date  06/23/19      OT LONG TERM GOAL #2   Title  Grip in L hand increase to more than 50% compare to R to carry more than 8 lbs tea pitcher and  squeeze washcloth    Baseline  NT 4 wks s/p    Time  8    Period  Weeks    Status  New    Target Date  06/23/19      OT LONG TERM GOAL #3   Title  L prehension strength increase to more than 50% compare to R hand to carry plate, cut with knife and fork, ty shoes    Baseline  NT 4 wks s/p    Time  8    Period  Weeks    Status  New    Target Date  06/23/19            Plan - 05/11/19 1630    Clinical Impression Statement  Pt is about 6 wks s/p Revision of CMC arthroplasty/suspention of tight rope - pt using CMC neoprene splint with crutches - pt was seen in the past for [place] and CTS for using crutches , and now she had thumb CMC surgery again - did discuss with pt switching to walker maybe - but pt do show great progres in PA and strength to stabilize during 3 point pinch and opposition - but having very hard time with RA  with hyper extention of MC during and with lateral grip - cont to increase AROM and strength without increasing pain    OT Occupational Profile and History  Problem Focused Assessment - Including review of records relating to presenting problem    Occupational performance deficits (Please refer to evaluation for details):  ADL's;IADL's;Leisure    Body Structure / Function / Physical Skills  ADL;Flexibility;ROM;UE functional use;Pain;Strength;IADL    Rehab Potential  Fair    Clinical Decision Making  Limited treatment options, no task modification necessary    Comorbidities Affecting Occupational Performance:  May have comorbidities impacting occupational performance    Modification or Assistance to Complete Evaluation   No modification of tasks or assist necessary to complete eval    OT Frequency  --   1-2 x wk depending on progress   OT Duration  6 weeks    OT Treatment/Interventions  Self-care/ADL  training;Patient/family education;Splinting;Manual Therapy;Paraffin;Fluidtherapy;Passive range of motion;Scar mobilization;Therapeutic exercise    Plan  assess progress with HEP and change HEP as needed    OT Home Exercise Plan  see pt instruction    Consulted and Agree with Plan of Care  Patient       Patient will benefit from skilled therapeutic intervention in order to improve the following deficits and impairments:   Body Structure / Function / Physical Skills: ADL, Flexibility, ROM, UE functional use, Pain, Strength, IADL       Visit Diagnosis: Stiffness of left hand, not elsewhere classified  Muscle weakness (generalized)  Other disturbances of skin sensation  Pain in left hand    Problem List Patient Active Problem List   Diagnosis Date Noted  . OSA (obstructive sleep apnea) 05/02/2019  .  Cough due to ACE inhibitor 07/10/2018  . Other fatigue 02/16/2018  . Vertigo 02/08/2018  . Screening for breast cancer 12/17/2017  . Flu vaccine need 10/30/2017  . Need for vaccination against Streptococcus pneumoniae using pneumococcal conjugate vaccine 13 10/30/2017  . Hypokalemia 08/28/2017  . Absolute anemia 08/28/2017  . Benign essential tremor 08/28/2017  . Chest pain 08/25/2017  . Depression 03/23/2017  . Hiatal hernia 04/11/2016  . Anxiety   . OA (osteoarthritis)   . Hyperlipidemia   . Essential hypertension   . Post-polio syndrome   . Lung nodule   . Esophageal dysmotility   . Incontinence of urine   . Obesity   . GERD (gastroesophageal reflux disease) 11/05/2012  . Pulmonary nodule 11/05/2012  . Shingles 01/22/2007    Rosalyn Gess OTR/l,CLT 05/11/2019, 4:34 PM  [facility] [address] [place], [address] Phone: [phone]   Fax:  [phone]  Name: Susan Shah MRN: [id number] Date of Birth: 05/11/1945

## 2019-05-18 ENCOUNTER — Ambulatory Visit: Payer: Medicare Other | Admitting: Occupational Therapy

## 2019-05-25 ENCOUNTER — Other Ambulatory Visit: Payer: Self-pay

## 2019-05-25 ENCOUNTER — Ambulatory Visit: Payer: Medicare Other | Attending: Orthopedic Surgery | Admitting: Occupational Therapy

## 2019-05-25 DIAGNOSIS — M6281 Muscle weakness (generalized): Secondary | ICD-10-CM | POA: Diagnosis not present

## 2019-05-25 DIAGNOSIS — M79642 Pain in left hand: Secondary | ICD-10-CM | POA: Diagnosis not present

## 2019-05-25 DIAGNOSIS — R209 Unspecified disturbances of skin sensation: Secondary | ICD-10-CM | POA: Insufficient documentation

## 2019-05-25 DIAGNOSIS — M25642 Stiffness of left hand, not elsewhere classified: Secondary | ICD-10-CM

## 2019-05-25 DIAGNOSIS — R208 Other disturbances of skin sensation: Secondary | ICD-10-CM

## 2019-05-25 NOTE — Therapy (Signed)
[facility] [address] [place], [address] Phone: [phone]   Fax:  [phone]  Occupational Therapy Treatment  Patient Details  Name: Susan Shah MRN: [id number] Date of Birth: 13-Jul-1945 Referring Provider (OT): Dr Rudene Christians   Encounter Date: 05/25/2019  OT End of Session - 05/25/19 1516    Visit Number  5    Number of Visits  10    Date for OT Re-Evaluation  06/23/19    OT Start Time  1430    OT Stop Time  1516    OT Time Calculation (min)  46 min    Activity Tolerance  Patient tolerated treatment well    Behavior During Therapy  [facility] for tasks assessed/performed       Past Medical History:  Diagnosis Date  . Anxiety   . Asthma 2015   mild, seasonal allergy triggered.  . Cancer ([place]) 2013   skin cancer  on left hand  . Depression   . Esophageal dysmotility   . GERD (gastroesophageal reflux disease) 11/05/2012  . History of hiatal hernia   . Hyperlipidemia   . Hypertension   . Incontinence of urine   . Lung nodule   . OA (osteoarthritis)   . Obesity   . PONV (postoperative nausea and vomiting)   . Post-polio syndrome    contracted at 36 months old  . Pulmonary nodule 11/05/2012   RML  . Shingles 2009  . Sleep apnea     Past Surgical History:  Procedure Laterality Date  . [place] STUDY N/A 04/24/2016   Procedure: 54 HOUR PH STUDY;  Surgeon: Lucilla Lame, MD;  Location: [facility] ENDOSCOPY;  Service: Endoscopy;  Laterality: N/A;  . ABDOMINAL HYSTERECTOMY     Total  . APPENDECTOMY    . CARPOMETACARPAL ([place]) FUSION OF THUMB Left 05/16/2016   Procedure: CARPOMETACARPAL [facility]) FUSION OF THUMB;  Surgeon: Hessie Knows, MD;  Location: [facility] ORS;  Service: Orthopedics;  Laterality: Left;  . CARPOMETACARPAL ([place]) FUSION OF THUMB Left 03/30/2019   Procedure: LEFT THUMB SUSPENSION PLASTY;  Surgeon: Hessie Knows, MD;  Location: [facility] ORS;  Service: Orthopedics;  Laterality: Left;  . CATARACT EXTRACTION  Bilateral 12/2012  . CHOLECYSTECTOMY    . COLONOSCOPY  2003  . ESOPHAGEAL MANOMETRY N/A 04/24/2016   Procedure: ESOPHAGEAL MANOMETRY (EM);  Surgeon: Lucilla Lame, MD;  Location: [facility] ENDOSCOPY;  Service: Endoscopy;  Laterality: N/A;  . ESOPHAGOGASTRODUODENOSCOPY  08/2011  . FOOT SURGERY    . HALLUX VALGUS CORRECTION    . HIP SURGERY Right 1950   tendon release r/t polio  . KNEE ARTHROSCOPY Left 06/23/2008  . RETINAL DETACHMENT SURGERY Right 03/2014  . ROTATOR CUFF REPAIR Bilateral   . URINARY SURGERY  2014   [place]    There were no vitals filed for this visit.  Subjective Assessment - 05/25/19 1437    Subjective   I don't really have pain - but feels some pins and needles randomly during day - less than 5 x day - using it more - I can do my leg brace, picking things, driving    Pertinent History  Pt had CMC arthroplasty revision 03/30/2019 - she had the first surgery done 05/16/2016 - she has history of polio and uses elbow crutches and has to apply pressure thru her hands - she is 4 wks s/p at eval this date - coming in with no splint    Patient Stated Goals  I want the  pain better and the function/ strength in my L thumb and hand to use for squeezing washcloth, tea pitcher , turn doorknob, cut food    Currently in Pain?  No/denies         [facility] OT Assessment - 05/25/19 0001      Strength   Right Hand Grip (lbs)  45    Right Hand Lateral Pinch  10 lbs    Right Hand 3 Point Pinch  14 lbs    Left Hand Grip (lbs)  41    Left Hand Lateral Pinch  9 lbs   8 with CMC neoprene   Left Hand 3 Point Pinch  10 lbs   9 wiht CMC neoeprene     Denies pain  Pt show increase grip and prehension strength and was able to do same if not more prehension and same grip without CMC neoprene  Show good stabilization out of CMC - but bont have hyper extention of MC      Assess progress with HEP  Opposition able to do 2nd thru 5th with less collapsing of CMC into palm - but focus on keeping  CMC out of palm Stabilization at thumb with waving of hand away from thumb - on table -9 reps reps at time-still hyper extention of MCat times- pt to do kinesiotape around [facility] and achor at radial wrist - and do tape on table and place [facility] on it - to assist with stabilization - and do 1 x 8 increase gradually next 2 wks to 3 sets   PA of thumb - same height for thumb out of CMC-  at height of 2nd diigit -great progress - pt to cont same HEP with rubber band for strengthening of PA CMC  Could carry over to 3 point pinch into putty - with stabilization of thumb at PA  To do light pinch - 3 point - 12 reps into short snake  Increase resistance of putty to teal this date -but drop down to 1 set of 10  And increase every 5 days set over the next 2 wks   Cont with Blocked MC flexion of thumb 10 repsreview and pt to keep same  Can do lat grip but focus to not have MC hyper extention  - do light pinch-  12 reps into short snake -and increase 2nd set Friday if no issues, and then next week end 3 sets - if pain free   Quality More important than Quantity  Fitted at eval with  CMC neoprene to use with crutches for supportand during activities that cause pain                 OT Education - 05/25/19 1516    Education Details  progress, HEP and cruthces use    Person(s) Educated  Patient    Methods  Explanation;Demonstration    Comprehension  Verbalized understanding;Returned demonstration       OT Short Term Goals - 04/28/19 1622      OT SHORT TERM GOAL #1   Title  Pain on PRWHE improve with more than 20 points    Baseline  Pain on PRWHE at eval 30/50    Time  4    Period  Weeks    Status  New    Target Date  05/26/19      OT SHORT TERM GOAL #2   Title  AROM for L thumb MP improve with 10 degrees   and less Hyper extention during  PA and RA at [facility] cut with utencils and turn doorknob    Baseline  45 degrees of hyper extnetion during RA, PA has to focus really hard  to prevent hyper ext, MC flexion 20    Time  4    Period  Weeks    Status  New    Target Date  05/26/19        OT Long Term Goals - 04/28/19 1626      OT LONG TERM GOAL #1   Title  Pt to be independent in homeprogram to increase AROM and strength in L thumb to use in ADLs symptoms free    Baseline  pain 4-8/10 with use and only using hand bathing and dressing 60%    Time  8    Period  Weeks    Status  New    Target Date  06/23/19      OT LONG TERM GOAL #2   Title  Grip in L hand increase to more than 50% compare to R to carry more than 8 lbs tea pitcher and  squeeze washcloth    Baseline  NT 4 wks s/p    Time  8    Period  Weeks    Status  New    Target Date  06/23/19      OT LONG TERM GOAL #3   Title  L prehension strength increase to more than 50% compare to R hand to carry plate, cut with knife and fork, ty shoes    Baseline  NT 4 wks s/p    Time  8    Period  Weeks    Status  New    Target Date  06/23/19            Plan - 05/25/19 1517    Clinical Impression Statement  Pt is 8 wks s/p Revision of CMC arthroplasty/suspention of tight rope - pt using CMC neoprene splint with crutches - and with activities - pt denies pain and report increase use of L hand in ADL's and IADL's - pt show increase grip and prehension strength with increase stability out of [place] - but still cont hyper extention at [facility] of thumb - pt to cont strengthening    OT Occupational Profile and History  Problem Focused Assessment - Including review of records relating to presenting problem    Occupational performance deficits (Please refer to evaluation for details):  ADL's;IADL's;Leisure    Body Structure / Function / Physical Skills  ADL;Flexibility;ROM;UE functional use;Pain;Strength;IADL    Rehab Potential  Fair    Clinical Decision Making  Limited treatment options, no task modification necessary    Comorbidities Affecting Occupational Performance:  May have comorbidities impacting occupational  performance    Modification or Assistance to Complete Evaluation   No modification of tasks or assist necessary to complete eval    OT Frequency  1x / week    OT Duration  6 weeks    OT Treatment/Interventions  Self-care/ADL training;Patient/family education;Splinting;Manual Therapy;Paraffin;Fluidtherapy;Passive range of motion;Scar mobilization;Therapeutic exercise    Plan  assess progress with HEP and change HEP as needed    OT Home Exercise Plan  see pt instruction    Consulted and Agree with Plan of Care  Patient       Patient will benefit from skilled therapeutic intervention in order to improve the following deficits and impairments:   Body Structure / Function / Physical Skills: ADL, Flexibility, ROM, UE functional use, Pain, Strength, IADL  Visit Diagnosis: Muscle weakness (generalized)  Other disturbances of skin sensation  Pain in left hand  Stiffness of left hand, not elsewhere classified    Problem List Patient Active Problem List   Diagnosis Date Noted  . OSA (obstructive sleep apnea) 05/02/2019  . Cough due to ACE inhibitor 07/10/2018  . Other fatigue 02/16/2018  . Vertigo 02/08/2018  . Screening for breast cancer 12/17/2017  . Flu vaccine need 10/30/2017  . Need for vaccination against Streptococcus pneumoniae using pneumococcal conjugate vaccine 13 10/30/2017  . Hypokalemia 08/28/2017  . Absolute anemia 08/28/2017  . Benign essential tremor 08/28/2017  . Chest pain 08/25/2017  . Depression 03/23/2017  . Hiatal hernia 04/11/2016  . Anxiety   . OA (osteoarthritis)   . Hyperlipidemia   . Essential hypertension   . Post-polio syndrome   . Lung nodule   . Esophageal dysmotility   . Incontinence of urine   . Obesity   . GERD (gastroesophageal reflux disease) 11/05/2012  . Pulmonary nodule 11/05/2012  . Shingles 01/22/2007    Rosalyn Gess OTR/l,CLT 05/25/2019, 3:20 PM  [facility] [address] [place], [address] Phone: [phone]   Fax:  [phone]  Name: Susan Shah MRN: [id number] Date of Birth: 05-22-1945

## 2019-05-25 NOTE — Patient Instructions (Signed)
See note

## 2019-06-01 ENCOUNTER — Emergency Department: Payer: Medicare Other

## 2019-06-01 ENCOUNTER — Emergency Department
Admission: EM | Admit: 2019-06-01 | Discharge: 2019-06-01 | Disposition: A | Discharge status: Home / Self Care | Payer: Medicare Other | Attending: Student in an Organized Health Care Education/Training Program | Admitting: Student in an Organized Health Care Education/Training Program

## 2019-06-01 ENCOUNTER — Other Ambulatory Visit: Payer: Self-pay

## 2019-06-01 ENCOUNTER — Encounter: Payer: Self-pay | Admitting: Emergency Medicine

## 2019-06-01 DIAGNOSIS — Y92009 Unspecified place in unspecified non-institutional (private) residence as the place of occurrence of the external cause: Secondary | ICD-10-CM | POA: Insufficient documentation

## 2019-06-01 DIAGNOSIS — S199XXA Unspecified injury of neck, initial encounter: Secondary | ICD-10-CM | POA: Diagnosis not present

## 2019-06-01 DIAGNOSIS — S0003XA Contusion of scalp, initial encounter: Secondary | ICD-10-CM | POA: Diagnosis not present

## 2019-06-01 DIAGNOSIS — Y999 Unspecified external cause status: Secondary | ICD-10-CM | POA: Insufficient documentation

## 2019-06-01 DIAGNOSIS — W010XXA Fall on same level from slipping, tripping and stumbling without subsequent striking against object, initial encounter: Secondary | ICD-10-CM | POA: Diagnosis not present

## 2019-06-01 DIAGNOSIS — Z85828 Personal history of other malignant neoplasm of skin: Secondary | ICD-10-CM | POA: Diagnosis not present

## 2019-06-01 DIAGNOSIS — S0990XA Unspecified injury of head, initial encounter: Secondary | ICD-10-CM

## 2019-06-01 DIAGNOSIS — Y93E5 Activity, floor mopping and cleaning: Secondary | ICD-10-CM | POA: Insufficient documentation

## 2019-06-01 NOTE — ED Triage Notes (Addendum)
Pt arrived via [facility] from home with reports of slipping on the floor in the [place] and hitting head on the stove. Denies any LOC.   Pt has hx of neurological issues and has unequal pupils at baseline.   Pt has hx of post polio syndrome, sees Dr. Manuella Ghazi

## 2019-06-01 NOTE — ED Triage Notes (Addendum)
First nurse note: Pt arrives to er via ems from home, pt had slipped on floor after mopping it, hit the back of her head denies loc, reports some dizziness and nausea at this time, denies using blood thinners Pt has a hx of detached retinas and cataract surgery history, ems reports that her pupils are unequal but this is her normal, pt has a hx of neurological issues with her right leg as well

## 2019-06-01 NOTE — ED Notes (Signed)
See triage note  Presents s/p slip and fall  States she hit her head on the stove  No LOC

## 2019-06-01 NOTE — ED Notes (Signed)
D/w Dr. Archie Balboa, CT head and C-spine at this time. No additional orders.

## 2019-06-01 NOTE — ED Provider Notes (Signed)
[facility] Emergency Department Provider Note ____________________________________________   First MD Initiated Contact with Patient 06/01/19 1720     (approximate)  I have reviewed the triage vital signs and the nursing notes.   HISTORY  Chief Complaint Fall and Head Injury  HPI Susan Shah is a 74 y.o. female presents to the emergency department for treatment and evaluation after mechanical, nonsyncopal fall prior to arrival. Just after mopping her floor, she slipped and fell backward. She hit her head on the stove. She denies loss of consciousness. She was able to get up without assistance.         Past Medical History:  Diagnosis Date  . Anxiety   . Asthma 2015   mild, seasonal allergy triggered.  . Cancer ([place]) 2013   skin cancer  on left hand  . Depression   . Esophageal dysmotility   . GERD (gastroesophageal reflux disease) 11/05/2012  . History of hiatal hernia   . Hyperlipidemia   . Hypertension   . Incontinence of urine   . Lung nodule   . OA (osteoarthritis)   . Obesity   . PONV (postoperative nausea and vomiting)   . Post-polio syndrome    contracted at 66 months old  . Pulmonary nodule 11/05/2012   RML  . Shingles 2009  . Sleep apnea     Patient Active Problem List   Diagnosis Date Noted  . OSA (obstructive sleep apnea) 05/02/2019  . Cough due to ACE inhibitor 07/10/2018  . Other fatigue 02/16/2018  . Vertigo 02/08/2018  . Screening for breast cancer 12/17/2017  . Flu vaccine need 10/30/2017  . Need for vaccination against Streptococcus pneumoniae using pneumococcal conjugate vaccine 13 10/30/2017  . Hypokalemia 08/28/2017  . Absolute anemia 08/28/2017  . Benign essential tremor 08/28/2017  . Chest pain 08/25/2017  . Depression 03/23/2017  . Hiatal hernia 04/11/2016  . Anxiety   . OA (osteoarthritis)   . Hyperlipidemia   . Essential hypertension   . Post-polio syndrome   . Lung nodule   . Esophageal  dysmotility   . Incontinence of urine   . Obesity   . GERD (gastroesophageal reflux disease) 11/05/2012  . Pulmonary nodule 11/05/2012  . Shingles 01/22/2007    Past Surgical History:  Procedure Laterality Date  . [place] STUDY N/A 04/24/2016   Procedure: 33 HOUR PH STUDY;  Surgeon: Lucilla Lame, MD;  Location: [facility] ENDOSCOPY;  Service: Endoscopy;  Laterality: N/A;  . ABDOMINAL HYSTERECTOMY     Total  . APPENDECTOMY    . CARPOMETACARPAL ([place]) FUSION OF THUMB Left 05/16/2016   Procedure: CARPOMETACARPAL [facility]) FUSION OF THUMB;  Surgeon: Hessie Knows, MD;  Location: [facility] ORS;  Service: Orthopedics;  Laterality: Left;  . CARPOMETACARPAL ([place]) FUSION OF THUMB Left 03/30/2019   Procedure: LEFT THUMB SUSPENSION PLASTY;  Surgeon: Hessie Knows, MD;  Location: [facility] ORS;  Service: Orthopedics;  Laterality: Left;  . CATARACT EXTRACTION Bilateral 12/2012  . CHOLECYSTECTOMY    . COLONOSCOPY  2003  . ESOPHAGEAL MANOMETRY N/A 04/24/2016   Procedure: ESOPHAGEAL MANOMETRY (EM);  Surgeon: Lucilla Lame, MD;  Location: [facility] ENDOSCOPY;  Service: Endoscopy;  Laterality: N/A;  . ESOPHAGOGASTRODUODENOSCOPY  08/2011  . FOOT SURGERY    . HALLUX VALGUS CORRECTION    . HIP SURGERY Right 1950   tendon release r/t polio  . KNEE ARTHROSCOPY Left 06/23/2008  . RETINAL DETACHMENT SURGERY Right 03/2014  . ROTATOR CUFF REPAIR Bilateral   . URINARY SURGERY  2014  [place]    Prior to Admission medications   Medication Sig Start Date End Date Taking? Authorizing Provider  acetaminophen (TYLENOL) 500 MG tablet Take 1,000 mg by mouth every 6 (six) hours as needed for moderate pain or headache.    [provider]  amLODipine (NORVASC) 2.5 MG tablet Take 1 tablet (2.5 mg total) by mouth daily. 01/06/19   Kendell Bane, NP  buPROPion (WELLBUTRIN XL) 150 MG 24 hr tablet Take 1 tablet (150 mg total) by mouth every morning. 03/08/19   Kendell Bane, NP  Carboxymethylcellul-Glycerin (LUBRICATING EYE DROPS OP)  Place 1 drop into both eyes daily as needed (dry eyes).    [provider]  cholecalciferol (VITAMIN D3) 25 MCG (1000 UNIT) tablet Take 1,000 Units by mouth daily.    [provider]  fluticasone (FLONASE) 50 MCG/ACT nasal spray Place 1 spray into both nostrils at bedtime as needed for allergies or rhinitis.    [provider]  HYDROcodone-acetaminophen (NORCO) 5-325 MG tablet Take 1 tablet by mouth every 4 (four) hours as needed for moderate pain. 03/30/19   Hessie Knows, MD  loratadine (QC LORATADINE ALLERGY RELIEF) 10 MG tablet Take 1 tablet (10 mg total) by mouth daily. 01/08/19   Ronnell Freshwater, NP  metoprolol tartrate (LOPRESSOR) 25 MG tablet Take 1 tablet (25 mg total) by mouth 2 (two) times daily with a meal. 04/01/19   Boscia, Greer Ee, NP  NON FORMULARY cpap device    [provider]  oxybutynin (DITROPAN-XL) 10 MG 24 hr tablet TAKE 2 TABLETS BY MOUTH EVERY DAY 04/26/19   Ronnell Freshwater, NP  pantoprazole (PROTONIX) 40 MG tablet Take 1 tablet (40 mg total) by mouth 2 (two) times daily. Patient taking differently: Take 40 mg by mouth daily.  05/29/18   Ronnell Freshwater, NP  potassium chloride SA (KLOR-CON) 20 MEQ tablet Take 1 tablet (20 mEq total) by mouth daily. 12/22/18   Ronnell Freshwater, NP  rOPINIRole (REQUIP) 0.5 MG tablet TAKE 1 TABLET BY MOUTH TWICE A DAY AS NEEDED 04/02/19   Kendell Bane, NP  simvastatin (ZOCOR) 20 MG tablet Take 1 tablet (20 mg total) by mouth daily at 6 PM. 03/26/19   Scarboro, Audie Clear, NP  vitamin B-12 (CYANOCOBALAMIN) 1000 MCG tablet Take 1,000 mcg by mouth daily.    [provider]    Allergies Soma [carisoprodol], Iodinated diagnostic agents, and Iodine  Family History  Problem Relation Age of Onset  . Heart disease Mother   . Heart disease Father   . Heart disease Brother   . Stroke Maternal Grandmother   . Heart disease Maternal Grandfather   . Heart attack Brother     Social History Social History    Tobacco Use  . Smoking status: Former Smoker    Quit date: 05/09/1968    Years since quitting: 51.0  . Smokeless tobacco: Never Used  Substance Use Topics  . Alcohol use: No  . Drug use: No    Review of Systems  Constitutional: No fever/chills. Eyes: No visual changes. ENT: No sore throat. Cardiovascular: Denies chest pain. Respiratory: Denies shortness of breath. Gastrointestinal: No abdominal pain. No nausea, no vomiting.  Genitourinary: Negative for dysuria. Musculoskeletal: Negative for back pain. Negative for neck pain. Skin: Negative for rash. Neurological: Negative for headaches, focal weakness or numbness. ____________________________________________   PHYSICAL EXAM:  VITAL SIGNS: ED Triage Vitals  Enc Vitals Group     BP 06/01/19 1702 (!) 141/87  Pulse Rate 06/01/19 1702 81     Resp 06/01/19 1702 18     Temp 06/01/19 1702 99.1 F (37.3 C)     Temp Source 06/01/19 1702 Oral     SpO2 06/01/19 1702 95 %     Weight 06/01/19 1658 175 lb (79.4 kg)     Height 06/01/19 1658 5\' 3"  (1.6 m)     Head Circumference --      Peak Flow --      Pain Score 06/01/19 1658 5     Pain Loc --      Pain Edu? --      Excl. in [place]? --     Constitutional: Alert and oriented. No acute distress. Eyes: Conjunctivae are normal. Head: Scalp hematoma right parietal area. Nose: No congestion/rhinnorhea. Mouth/Throat: Mucous membranes are moist. Atraumatic. Neck: No stridor.   Hematological/Lymphatic/Immunilogical: No cervical lymphadenopathy. Cardiovascular: Normal rate, regular rhythm. Grossly normal heart sounds.  Good peripheral circulation. Respiratory: Normal respiratory effort.  No retractions. Lungs CTAB. Gastrointestinal: Soft and nontender. No distention. No abdominal bruits. No CVA tenderness. Genitourinary:  Musculoskeletal: No lower extremity tenderness nor edema.  No joint effusions. Neurologic:  Normal speech and language. No gross focal neurologic deficits are  appreciated. No gait instability. Skin:  Skin is warm, dry and intact. No rash noted. Psychiatric: Mood and affect are normal. Speech and behavior are normal.  ____________________________________________   LABS (all labs ordered are listed, but only abnormal results are displayed)  Labs Reviewed - No data to display ____________________________________________  EKG  Not indicated. ____________________________________________  RADIOLOGY  ED MD interpretation:    CT of the head and cervical spine are negative for acute findings per radiology.  I, Sherrie George, personally viewed and evaluated these images (plain radiographs) as part of my medical decision making, as well as reviewing the written report by the radiologist.  Official radiology report(s): CT Head Wo Contrast  Result Date: 06/01/2019 CLINICAL DATA:  Fall, head trauma EXAM: CT HEAD WITHOUT CONTRAST TECHNIQUE: Contiguous axial images were obtained from the base of the skull through the vertex without intravenous contrast. COMPARISON:  07/22/2017 FINDINGS: Brain: No acute intracranial abnormality. Specifically, no hemorrhage, hydrocephalus, mass lesion, acute infarction, or significant intracranial injury. Vascular: No hyperdense vessel or unexpected calcification. Skull: No acute calvarial abnormality. Sinuses/Orbits: Visualized paranasal sinuses and mastoids clear. Orbital soft tissues unremarkable. Other: None IMPRESSION: No acute intracranial abnormality. Electronically Signed   By: Rolm Baptise M.D.   On: 06/01/2019 18:34   CT Cervical Spine Wo Contrast  Result Date: 06/01/2019 CLINICAL DATA:  Fall, head trauma EXAM: CT CERVICAL SPINE WITHOUT CONTRAST TECHNIQUE: Multidetector CT imaging of the cervical spine was performed without intravenous contrast. Multiplanar CT image reconstructions were also generated. COMPARISON:  None. FINDINGS: Alignment: Slight anterolisthesis of C5 on C6 related to facet disease. Skull base  and vertebrae: No acute fracture. No primary bone lesion or focal pathologic process. Soft tissues and spinal canal: No prevertebral fluid or swelling. No visible canal hematoma. Disc levels: Diffuse advanced degenerative facet disease bilaterally. Disc space narrowing and spurring diffusely. Upper chest: No acute findings Other: None IMPRESSION: Cervical spondylosis.  No acute bony abnormality. Electronically Signed   By: Rolm Baptise M.D.   On: 06/01/2019 18:40    ____________________________________________   PROCEDURES  Procedure(s) performed (including Critical Care):  Procedures  ____________________________________________   INITIAL IMPRESSION / ASSESSMENT AND PLAN     74 year old female presenting to the emergency department for treatment and evaluation after sustaining  a mechanical, nonsyncopal fall at home. See HPI for further details. Plan will be to review CT of the head and cervical spine that was ordered while awaiting ER room assignment.  DIFFERENTIAL DIAGNOSIS  Concussion, hematoma, intracranial pathology.  ED COURSE  CT of the head and cervical spine are reassuring. She will be discharged home with head injury instructions. She is to return to the emergency department or see primary care for symptoms of concern. ____________________________________________   FINAL CLINICAL IMPRESSION(S) / ED DIAGNOSES  Final diagnoses:  Minor head injury without loss of consciousness, initial encounter  Hematoma of right parietal scalp, initial encounter     ED Discharge Orders    None       Susan Shah was evaluated in Emergency Department on 06/01/2019 for the symptoms described in the history of present illness. She was evaluated in the context of the global COVID-19 pandemic, which necessitated consideration that the patient might be at risk for infection with the SARS-CoV-2 virus that causes COVID-19. Institutional protocols and algorithms that pertain to the  evaluation of patients at risk for COVID-19 are in a state of rapid change based on information released by regulatory bodies including the CDC and federal and state organizations. These policies and algorithms were followed during the patient's care in the ED.   Note:  This document was prepared using Dragon voice recognition software and may include unintentional dictation errors.   Victorino Dike, FNP 06/01/19 2136    Merlyn Lot, MD 06/01/19 2337

## 2019-06-07 ENCOUNTER — Ambulatory Visit: Payer: Medicare Other | Admitting: Occupational Therapy

## 2019-06-08 ENCOUNTER — Ambulatory Visit: Payer: Medicare Other | Admitting: Adult Health

## 2019-06-10 ENCOUNTER — Ambulatory Visit: Payer: Medicare Other | Admitting: Adult Health

## 2019-06-14 ENCOUNTER — Other Ambulatory Visit: Payer: Self-pay

## 2019-06-14 MED ORDER — POTASSIUM CHLORIDE CRYS ER 20 MEQ PO TBCR: 20.0000 meq | EXTENDED_RELEASE_TABLET | Freq: Every day | ORAL | 1 refills | Status: DC

## 2019-06-15 ENCOUNTER — Ambulatory Visit: Payer: Medicare Other | Admitting: Occupational Therapy

## 2019-06-23 ENCOUNTER — Ambulatory Visit: Payer: Medicare Other | Admitting: Occupational Therapy

## 2019-06-24 ENCOUNTER — Other Ambulatory Visit: Payer: Self-pay

## 2019-06-24 MED ORDER — METOPROLOL TARTRATE 25 MG PO TABS: 25.0000 mg | ORAL_TABLET | Freq: Two times a day (BID) | ORAL | 3 refills | Status: DC

## 2019-06-28 ENCOUNTER — Ambulatory Visit: Payer: Medicare Other | Admitting: Occupational Therapy

## 2019-06-29 ENCOUNTER — Other Ambulatory Visit: Payer: Self-pay

## 2019-06-29 MED ORDER — PANTOPRAZOLE SODIUM 40 MG PO TBEC: 40.0000 mg | DELAYED_RELEASE_TABLET | Freq: Two times a day (BID) | ORAL | 1 refills | Status: DC

## 2019-07-02 ENCOUNTER — Other Ambulatory Visit: Payer: Self-pay | Admitting: Adult Health

## 2019-07-02 DIAGNOSIS — G250 Essential tremor: Secondary | ICD-10-CM

## 2019-07-05 ENCOUNTER — Other Ambulatory Visit: Payer: Self-pay | Admitting: Adult Health

## 2019-07-06 ENCOUNTER — Ambulatory Visit: Payer: Medicare Other | Attending: Orthopedic Surgery | Admitting: Occupational Therapy

## 2019-07-06 ENCOUNTER — Other Ambulatory Visit: Payer: Self-pay

## 2019-07-06 DIAGNOSIS — M25642 Stiffness of left hand, not elsewhere classified: Secondary | ICD-10-CM | POA: Diagnosis present

## 2019-07-06 DIAGNOSIS — M79642 Pain in left hand: Secondary | ICD-10-CM | POA: Diagnosis present

## 2019-07-06 DIAGNOSIS — M6281 Muscle weakness (generalized): Secondary | ICD-10-CM | POA: Insufficient documentation

## 2019-07-06 DIAGNOSIS — R209 Unspecified disturbances of skin sensation: Secondary | ICD-10-CM | POA: Insufficient documentation

## 2019-07-06 DIAGNOSIS — R208 Other disturbances of skin sensation: Secondary | ICD-10-CM

## 2019-07-06 NOTE — Therapy (Signed)
[facility] [address] [place], [address] Phone: [phone]   Fax:  [phone]  Occupational Therapy Treatment  Patient Details  Name: Susan Shah MRN: [id number] Date of Birth: [birth year] Referring Provider (OT): Dr Rudene Christians   Encounter Date: 07/06/2019   OT End of Session - 07/06/19 1510    Visit Number 6    Number of Visits 6    Date for OT Re-Evaluation 07/06/19    OT Start Time 1430    OT Stop Time 1500    OT Time Calculation (min) 30 min    Activity Tolerance Patient tolerated treatment well    Behavior During Therapy [facility] for tasks assessed/performed           Past Medical History:  Diagnosis Date  . Anxiety   . Asthma 2015   mild, seasonal allergy triggered.  . Cancer ([place]) 2013   skin cancer  on left hand  . Depression   . Esophageal dysmotility   . GERD (gastroesophageal reflux disease) 11/05/2012  . History of hiatal hernia   . Hyperlipidemia   . Hypertension   . Incontinence of urine   . Lung nodule   . OA (osteoarthritis)   . Obesity   . PONV (postoperative nausea and vomiting)   . Post-polio syndrome    contracted at 30 months old  . Pulmonary nodule 11/05/2012   RML  . Shingles 2009  . Sleep apnea     Past Surgical History:  Procedure Laterality Date  . [place] STUDY N/A 04/24/2016   Procedure: 63 HOUR PH STUDY;  Surgeon: Lucilla Lame, MD;  Location: [facility] ENDOSCOPY;  Service: Endoscopy;  Laterality: N/A;  . ABDOMINAL HYSTERECTOMY     Total  . APPENDECTOMY    . CARPOMETACARPAL ([place]) FUSION OF THUMB Left 05/16/2016   Procedure: CARPOMETACARPAL [facility]) FUSION OF THUMB;  Surgeon: Hessie Knows, MD;  Location: [facility] ORS;  Service: Orthopedics;  Laterality: Left;  . CARPOMETACARPAL ([place]) FUSION OF THUMB Left 03/30/2019   Procedure: LEFT THUMB SUSPENSION PLASTY;  Surgeon: Hessie Knows, MD;  Location: [facility] ORS;  Service: Orthopedics;  Laterality: Left;  . CATARACT EXTRACTION  Bilateral 12/2012  . CHOLECYSTECTOMY    . COLONOSCOPY  2003  . ESOPHAGEAL MANOMETRY N/A 04/24/2016   Procedure: ESOPHAGEAL MANOMETRY (EM);  Surgeon: Lucilla Lame, MD;  Location: [facility] ENDOSCOPY;  Service: Endoscopy;  Laterality: N/A;  . ESOPHAGOGASTRODUODENOSCOPY  08/2011  . FOOT SURGERY    . HALLUX VALGUS CORRECTION    . HIP SURGERY Right 1950   tendon release r/t polio  . KNEE ARTHROSCOPY Left 06/23/2008  . RETINAL DETACHMENT SURGERY Right 03/2014  . ROTATOR CUFF REPAIR Bilateral   . URINARY SURGERY  2014   [place]    There were no vitals filed for this visit.   Subjective Assessment - 07/06/19 1508    Subjective  I am doing okay -thumb I think is better- only hurts once in blue moon -and not more than 2/10 - I do use the soft black thumb splint walking with my crutch - I did fell again since I seen you    Pertinent History Pt had CMC arthroplasty revision 03/30/2019 - she had the first surgery done 05/16/2016 - she has history of polio and uses elbow crutches and has to apply pressure thru her hands - she is 4 wks s/p at eval this date - coming in with no splint    Patient Stated Goals I  want the pain better and the function/ strength in my L thumb and hand to use for squeezing washcloth, tea pitcher , turn doorknob, cut food    Currently in Pain? No/denies              [facility] OT Assessment - 07/06/19 0001      Strength   Right Hand Grip (lbs) 48    Right Hand Lateral Pinch 9 lbs    Right Hand 3 Point Pinch 13 lbs    Left Hand Grip (lbs) 41    Left Hand Lateral Pinch 8 lbs    Left Hand 3 Point Pinch 10 lbs      Left Hand AROM   L Thumb MCP 0-60 5 Degrees    L Thumb Palmar ADduction/ABduction 0-45 60           Pt report using hand in ADL's and light IADL's -but because of her history of polio she do not do a lot of house work - do some Pharmacologist.  Pain only some times but very little- less than 2/10  She do use CMC neoprene splint with crutch to cushion CMC  strength for  thumb CMC PA and RA 5/5 Still have that hyper extention of MC of thumb  Pt grip and prehension compare to R hand WNL   Pt did had fall about 6 wks ago and the last month again  Talked with pt about falls, balance - and risk for injury or fracture Pt do feel like her R leg is not as strong as it use to be  She had PT last time in 2013 or 2014 when she had L TKR Pt agree for OT to contact her PCP to recommend PT eval and she went in the past to [place] [facility] for PT - will send message to her PCP                   OT Education - 07/06/19 1509    Education Details HEP and discharge instructions    Person(s) Educated Patient    Methods Explanation;Demonstration    Comprehension Verbalized understanding;Returned demonstration            OT Short Term Goals - 07/06/19 1644      OT SHORT TERM GOAL #1   Title Pain on PRWHE improve with more than 20 points    Baseline no pain    Status Partially Met      OT SHORT TERM GOAL #2   Title AROM for L thumb MP improve with 10 degrees   and less Hyper extention during PA and RA at [facility] cut with utencils and turn doorknob    Status Achieved             OT Long Term Goals - 07/06/19 1644      OT LONG TERM GOAL #1   Title Pt to be independent in homeprogram to increase AROM and strength in L thumb to use in ADLs symptoms free    Status Achieved      OT LONG TERM GOAL #2   Title Grip in L hand increase to more than 50% compare to R to carry more than 8 lbs tea pitcher and  squeeze washcloth    Baseline Grip R 48 lbs and L 45    Status Achieved                 Plan - 07/06/19 1511    Clinical Impression Statement  Pt is13 wks s/p Revision of CMC arthroplasty/suspention of tight rope - pt using CMC neoprene splint with crutches only- pt denies pain and report increase strength and using her  L hand in ADL's and IADL's - pt show increase grip and prehension strength WNL compare to R hand  with increase stability at of  [facility] - cont to have hyper extention at [facility] of thumb - pt discharge at this time    OT Occupational Profile and History Problem Focused Assessment - Including review of records relating to presenting problem    Occupational performance deficits (Please refer to evaluation for details): ADL's;IADL's;Leisure    Body Structure / Function / Physical Skills ADL;Flexibility;ROM;UE functional use;Pain;Strength;IADL    Rehab Potential Fair    Clinical Decision Making Limited treatment options, no task modification necessary    Comorbidities Affecting Occupational Performance: May have comorbidities impacting occupational performance    Modification or Assistance to Complete Evaluation  No modification of tasks or assist necessary to complete eval    OT Treatment/Interventions Self-care/ADL training;Patient/family education;Splinting;Manual Therapy;Paraffin;Fluidtherapy;Passive range of motion;Scar mobilization;Therapeutic exercise    Plan discharge instructions    OT Home Exercise Plan see pt instruction    Consulted and Agree with Plan of Care Patient           Patient will benefit from skilled therapeutic intervention in order to improve the following deficits and impairments:   Body Structure / Function / Physical Skills: ADL, Flexibility, ROM, UE functional use, Pain, Strength, IADL       Visit Diagnosis: Muscle weakness (generalized)  Other disturbances of skin sensation  Pain in left hand  Stiffness of left hand, not elsewhere classified    Problem List Patient Active Problem List   Diagnosis Date Noted  . OSA (obstructive sleep apnea) 05/02/2019  . Cough due to ACE inhibitor 07/10/2018  . Other fatigue 02/16/2018  . Vertigo 02/08/2018  . Screening for breast cancer 12/17/2017  . Flu vaccine need 10/30/2017  . Need for vaccination against Streptococcus pneumoniae using pneumococcal conjugate vaccine 13 10/30/2017  . Hypokalemia 08/28/2017  . Absolute anemia 08/28/2017  .  Benign essential tremor 08/28/2017  . Chest pain 08/25/2017  . Depression 03/23/2017  . Hiatal hernia 04/11/2016  . Anxiety   . OA (osteoarthritis)   . Hyperlipidemia   . Essential hypertension   . Post-polio syndrome   . Lung nodule   . Esophageal dysmotility   . Incontinence of urine   . Obesity   . GERD (gastroesophageal reflux disease) 11/05/2012  . Pulmonary nodule 11/05/2012  . Shingles 01/22/2007    Rosalyn Gess 07/06/2019, 4:55 PM   [facility] [address] [place], [address] Phone: [phone]   Fax:  [phone]  Name: CARMELINA BALDUCCI MRN: [id number] Date of Birth: [birth year]

## 2019-07-06 NOTE — Therapy (Signed)
[facility] [address] [place], [address] Phone: [phone]   Fax:  [phone]  Occupational Therapy Treatment  Patient Details  Name: Susan Shah MRN: [id number] Date of Birth: [birth year] Referring Provider (OT): Dr Rudene Christians   Encounter Date: 07/06/2019   OT End of Session - 07/06/19 1510    Visit Number 6    Number of Visits 6    Date for OT Re-Evaluation 07/06/19    OT Start Time 1430    OT Stop Time 1500    OT Time Calculation (min) 30 min    Activity Tolerance Patient tolerated treatment well    Behavior During Therapy [facility] for tasks assessed/performed           Past Medical History:  Diagnosis Date  . Anxiety   . Asthma 2015   mild, seasonal allergy triggered.  . Cancer ([place]) 2013   skin cancer  on left hand  . Depression   . Esophageal dysmotility   . GERD (gastroesophageal reflux disease) 11/05/2012  . History of hiatal hernia   . Hyperlipidemia   . Hypertension   . Incontinence of urine   . Lung nodule   . OA (osteoarthritis)   . Obesity   . PONV (postoperative nausea and vomiting)   . Post-polio syndrome    contracted at 30 months old  . Pulmonary nodule 11/05/2012   RML  . Shingles 2009  . Sleep apnea     Past Surgical History:  Procedure Laterality Date  . [place] STUDY N/A 04/24/2016   Procedure: 63 HOUR PH STUDY;  Surgeon: Lucilla Lame, MD;  Location: [facility] ENDOSCOPY;  Service: Endoscopy;  Laterality: N/A;  . ABDOMINAL HYSTERECTOMY     Total  . APPENDECTOMY    . CARPOMETACARPAL ([place]) FUSION OF THUMB Left 05/16/2016   Procedure: CARPOMETACARPAL [facility]) FUSION OF THUMB;  Surgeon: Hessie Knows, MD;  Location: [facility] ORS;  Service: Orthopedics;  Laterality: Left;  . CARPOMETACARPAL ([place]) FUSION OF THUMB Left 03/30/2019   Procedure: LEFT THUMB SUSPENSION PLASTY;  Surgeon: Hessie Knows, MD;  Location: [facility] ORS;  Service: Orthopedics;  Laterality: Left;  . CATARACT EXTRACTION  Bilateral 12/2012  . CHOLECYSTECTOMY    . COLONOSCOPY  2003  . ESOPHAGEAL MANOMETRY N/A 04/24/2016   Procedure: ESOPHAGEAL MANOMETRY (EM);  Surgeon: Lucilla Lame, MD;  Location: [facility] ENDOSCOPY;  Service: Endoscopy;  Laterality: N/A;  . ESOPHAGOGASTRODUODENOSCOPY  08/2011  . FOOT SURGERY    . HALLUX VALGUS CORRECTION    . HIP SURGERY Right 1950   tendon release r/t polio  . KNEE ARTHROSCOPY Left 06/23/2008  . RETINAL DETACHMENT SURGERY Right 03/2014  . ROTATOR CUFF REPAIR Bilateral   . URINARY SURGERY  2014   [place]    There were no vitals filed for this visit.   Subjective Assessment - 07/06/19 1508    Subjective  I am doing okay -thumb I think is better- only hurts once in blue moon -and not more than 2/10 - I do use the soft black thumb splint walking with my crutch - I did fell again since I seen you    Pertinent History Pt had CMC arthroplasty revision 03/30/2019 - she had the first surgery done 05/16/2016 - she has history of polio and uses elbow crutches and has to apply pressure thru her hands - she is 4 wks s/p at eval this date - coming in with no splint    Patient Stated Goals I  want the pain better and the function/ strength in my L thumb and hand to use for squeezing washcloth, tea pitcher , turn doorknob, cut food    Currently in Pain? No/denies                                 OT Education - 07/06/19 1509    Education Details HEP and discharge instructions    Person(s) Educated Patient    Methods Explanation;Demonstration    Comprehension Verbalized understanding;Returned demonstration            OT Short Term Goals - 07/06/19 1644      OT SHORT TERM GOAL #1   Title Pain on PRWHE improve with more than 20 points    Baseline no pain    Status Partially Met      OT SHORT TERM GOAL #2   Title AROM for L thumb MP improve with 10 degrees   and less Hyper extention during PA and RA at [facility] cut with utencils and turn doorknob    Status Achieved              OT Long Term Goals - 07/06/19 1644      OT LONG TERM GOAL #1   Title Pt to be independent in homeprogram to increase AROM and strength in L thumb to use in ADLs symptoms free    Status Achieved      OT LONG TERM GOAL #2   Title Grip in L hand increase to more than 50% compare to R to carry more than 8 lbs tea pitcher and  squeeze washcloth    Baseline Grip R 48 lbs and L 45    Status Achieved                 Plan - 07/06/19 1511    Clinical Impression Statement Pt is13 wks s/p Revision of CMC arthroplasty/suspention of tight rope - pt using CMC neoprene splint with crutches only- pt denies pain and report increase strength and using her  L hand in ADL's and IADL's - pt show increase grip and prehension strength WNL compare to R hand  with increase stability at of [facility] - cont to have hyper extention at [facility] of thumb - pt discharge at this time    OT Occupational Profile and History Problem Focused Assessment - Including review of records relating to presenting problem    Occupational performance deficits (Please refer to evaluation for details): ADL's;IADL's;Leisure    Body Structure / Function / Physical Skills ADL;Flexibility;ROM;UE functional use;Pain;Strength;IADL    Rehab Potential Fair    Clinical Decision Making Limited treatment options, no task modification necessary    Comorbidities Affecting Occupational Performance: May have comorbidities impacting occupational performance    Modification or Assistance to Complete Evaluation  No modification of tasks or assist necessary to complete eval    OT Treatment/Interventions Self-care/ADL training;Patient/family education;Splinting;Manual Therapy;Paraffin;Fluidtherapy;Passive range of motion;Scar mobilization;Therapeutic exercise    Plan discharge instructions    OT Home Exercise Plan see pt instruction    Consulted and Agree with Plan of Care Patient           Patient will benefit from skilled therapeutic  intervention in order to improve the following deficits and impairments:   Body Structure / Function / Physical Skills: ADL, Flexibility, ROM, UE functional use, Pain, Strength, IADL       Visit Diagnosis: Muscle weakness (generalized)  Other disturbances of skin sensation  Pain in left hand  Stiffness of left hand, not elsewhere classified    Problem List Patient Active Problem List   Diagnosis Date Noted  . OSA (obstructive sleep apnea) 05/02/2019  . Cough due to ACE inhibitor 07/10/2018  . Other fatigue 02/16/2018  . Vertigo 02/08/2018  . Screening for breast cancer 12/17/2017  . Flu vaccine need 10/30/2017  . Need for vaccination against Streptococcus pneumoniae using pneumococcal conjugate vaccine 13 10/30/2017  . Hypokalemia 08/28/2017  . Absolute anemia 08/28/2017  . Benign essential tremor 08/28/2017  . Chest pain 08/25/2017  . Depression 03/23/2017  . Hiatal hernia 04/11/2016  . Anxiety   . OA (osteoarthritis)   . Hyperlipidemia   . Essential hypertension   . Post-polio syndrome   . Lung nodule   . Esophageal dysmotility   . Incontinence of urine   . Obesity   . GERD (gastroesophageal reflux disease) 11/05/2012  . Pulmonary nodule 11/05/2012  . Shingles 01/22/2007    Rosalyn Gess 07/06/2019, 4:46 PM  [facility] [address], [place], [address] Phone: [phone]   Fax:  [phone]  Name: Susan Shah MRN: [id number] Date of Birth: Mar 26, 1945

## 2019-07-06 NOTE — Patient Instructions (Signed)
See note

## 2019-07-13 ENCOUNTER — Ambulatory Visit (INDEPENDENT_AMBULATORY_CARE_PROVIDER_SITE_OTHER): Payer: Medicare Other | Admitting: Nurse Practitioner

## 2019-07-13 ENCOUNTER — Other Ambulatory Visit: Payer: Self-pay

## 2019-07-13 ENCOUNTER — Encounter: Payer: Self-pay | Admitting: Nurse Practitioner

## 2019-07-13 VITALS — BP 135/74 | HR 80 | Temp 98.0°F | Resp 16 | Ht 63.0 in | Wt 176.5 lb

## 2019-07-13 DIAGNOSIS — I10 Essential (primary) hypertension: Secondary | ICD-10-CM | POA: Diagnosis not present

## 2019-07-13 DIAGNOSIS — M26609 Unspecified temporomandibular joint disorder, unspecified side: Secondary | ICD-10-CM | POA: Diagnosis not present

## 2019-07-13 DIAGNOSIS — R531 Weakness: Secondary | ICD-10-CM

## 2019-07-13 DIAGNOSIS — G250 Essential tremor: Secondary | ICD-10-CM | POA: Diagnosis not present

## 2019-07-13 MED ORDER — LOSARTAN POTASSIUM-HCTZ 100-25 MG PO TABS: 1.0000 | ORAL_TABLET | Freq: Every day | ORAL | 1 refills | Status: DC

## 2019-07-13 MED ORDER — TIZANIDINE HCL 2 MG PO TABS: 2.0000 mg | ORAL_TABLET | Freq: Two times a day (BID) | ORAL | 1 refills | Status: DC | PRN

## 2019-07-13 NOTE — Progress Notes (Signed)
[facility] [place], [place] [address]  Internal MEDICINE  Office Visit Note  Patient Name: Susan Shah  [id number]  [id number]  Date of Service: 07/21/2019  Chief Complaint  Patient presents with  . Follow-up    Wants to discuss changing Losartan  . Jaw Pain    Right side jaw pain for a couple of weeks  . Hypertension  . Hyperlipidemia  . Gastroesophageal Reflux  . Depression    The patient is here for routine follow up. She states that she has been having some right jaw pain for a few weeks.  This started a few weeks ago, after having some dental work done. Had to have three crowns placed, and about a week alter, the pain started. Keeps her awake, thus, keeping her husband awake as well. She has contacted the dentist, and he is currently on vacation.  She has noted worsening tremor. She does see neurology due to muscle weakness and tremor. She states that at her most recent visit with neurology, increased tremor and weakness were not problematic. This has been new development.       Current Medication: Outpatient Encounter Medications as of 07/13/2019  Medication Sig  . acetaminophen (TYLENOL) 500 MG tablet Take 1,000 mg by mouth every 6 (six) hours as needed for moderate pain or headache.  Marland Kitchen amLODipine (NORVASC) 2.5 MG tablet Take 1 tablet (2.5 mg total) by mouth daily.  Marland Kitchen buPROPion (WELLBUTRIN XL) 150 MG 24 hr tablet Take 1 tablet (150 mg total) by mouth every morning.  . Carboxymethylcellul-Glycerin (LUBRICATING EYE DROPS OP) Place 1 drop into both eyes daily as needed (dry eyes).  . cholecalciferol (VITAMIN D3) 25 MCG (1000 UNIT) tablet Take 1,000 Units by mouth daily.  Marland Kitchen escitalopram (LEXAPRO) 10 MG tablet TAKE ONE TABLET BY MOUTH EVERY DAY AT 5-7 PM  . fluticasone (FLONASE) 50 MCG/ACT nasal spray Place 1 spray into both nostrils at bedtime as needed for allergies or rhinitis.  Marland Kitchen HYDROcodone-acetaminophen (NORCO) 5-325 MG tablet Take 1 tablet  by mouth every 4 (four) hours as needed for moderate pain.  Marland Kitchen loratadine (QC LORATADINE ALLERGY RELIEF) 10 MG tablet Take 1 tablet (10 mg total) by mouth daily.  Marland Kitchen losartan-hydrochlorothiazide (HYZAAR) 100-25 MG tablet Take 1 tablet by mouth daily.  . metoprolol tartrate (LOPRESSOR) 25 MG tablet Take 1 tablet (25 mg total) by mouth 2 (two) times daily with a meal.  . NON FORMULARY cpap device  . pantoprazole (PROTONIX) 40 MG tablet Take 1 tablet (40 mg total) by mouth 2 (two) times daily.  . potassium chloride SA (KLOR-CON) 20 MEQ tablet Take 1 tablet (20 mEq total) by mouth daily.  Marland Kitchen rOPINIRole (REQUIP) 0.5 MG tablet TAKE 1 TABLET BY MOUTH TWICE A DAY AS NEEDED  . simvastatin (ZOCOR) 20 MG tablet Take 1 tablet (20 mg total) by mouth daily at 6 PM.  . vitamin B-12 (CYANOCOBALAMIN) 1000 MCG tablet Take 1,000 mcg by mouth daily.  . [DISCONTINUED] losartan-hydrochlorothiazide (HYZAAR) 100-25 MG tablet Take 1 tablet by mouth daily.  . [DISCONTINUED] oxybutynin (DITROPAN-XL) 10 MG 24 hr tablet TAKE 2 TABLETS BY MOUTH EVERY DAY  . tiZANidine (ZANAFLEX) 2 MG tablet Take 1 tablet (2 mg total) by mouth 2 (two) times daily as needed for muscle spasms.   No facility-administered encounter medications on file as of 07/13/2019.    Surgical History: Past Surgical History:  Procedure Laterality Date  . [place] STUDY N/A 04/24/2016   Procedure: 24  HOUR PH STUDY;  Surgeon: Lucilla Lame, MD;  Location: [facility] ENDOSCOPY;  Service: Endoscopy;  Laterality: N/A;  . ABDOMINAL HYSTERECTOMY     Total  . APPENDECTOMY    . CARPOMETACARPAL ([place]) FUSION OF THUMB Left 05/16/2016   Procedure: CARPOMETACARPAL [facility]) FUSION OF THUMB;  Surgeon: Hessie Knows, MD;  Location: [facility] ORS;  Service: Orthopedics;  Laterality: Left;  . CARPOMETACARPAL ([place]) FUSION OF THUMB Left 03/30/2019   Procedure: LEFT THUMB SUSPENSION PLASTY;  Surgeon: Hessie Knows, MD;  Location: [facility] ORS;  Service: Orthopedics;  Laterality: Left;  . CATARACT  EXTRACTION Bilateral 12/2012  . CHOLECYSTECTOMY    . COLONOSCOPY  2003  . ESOPHAGEAL MANOMETRY N/A 04/24/2016   Procedure: ESOPHAGEAL MANOMETRY (EM);  Surgeon: Lucilla Lame, MD;  Location: [facility] ENDOSCOPY;  Service: Endoscopy;  Laterality: N/A;  . ESOPHAGOGASTRODUODENOSCOPY  08/2011  . FOOT SURGERY    . HALLUX VALGUS CORRECTION    . HIP SURGERY Right 1950   tendon release r/t polio  . KNEE ARTHROSCOPY Left 06/23/2008  . RETINAL DETACHMENT SURGERY Right 03/2014  . ROTATOR CUFF REPAIR Bilateral   . URINARY SURGERY  2014   [place]    Medical History: Past Medical History:  Diagnosis Date  . Anxiety   . Asthma 2015   mild, seasonal allergy triggered.  . Cancer ([place]) 2013   skin cancer  on left hand  . Depression   . Esophageal dysmotility   . GERD (gastroesophageal reflux disease) 11/05/2012  . History of hiatal hernia   . Hyperlipidemia   . Hypertension   . Incontinence of urine   . Lung nodule   . OA (osteoarthritis)   . Obesity   . PONV (postoperative nausea and vomiting)   . Post-polio syndrome    contracted at 65 months old  . Pulmonary nodule 11/05/2012   RML  . Shingles 2009  . Sleep apnea     Family History: Family History  Problem Relation Age of Onset  . Heart disease Mother   . Heart disease Father   . Heart disease Brother   . Stroke Maternal Grandmother   . Heart disease Maternal Grandfather   . Heart attack Brother     Social History   Socioeconomic History  . Marital status: Married    Spouse name: Not on file  . Number of children: Not on file  . Years of education: Not on file  . Highest education level: Not on file  Occupational History  . Not on file  Tobacco Use  . Smoking status: Former Smoker    Quit date: 05/09/1968    Years since quitting: 51.2  . Smokeless tobacco: Never Used  Vaping Use  . Vaping Use: Never used  Substance and Sexual Activity  . Alcohol use: No  . Drug use: No  . Sexual activity: Not on file  Other Topics  Concern  . Not on file  Social History Narrative  . Not on file   Social Determinants of Health   Financial Resource Strain:   . Difficulty of Paying Living Expenses:   Food Insecurity:   . Worried About Charity fundraiser in the Last Year:   . Arboriculturist in the Last Year:   Transportation Needs:   . Film/video editor (Medical):   Marland Kitchen Lack of Transportation (Non-Medical):   Physical Activity:   . Days of Exercise per Week:   . Minutes of Exercise per Session:   Stress:   . Feeling of Stress :  Social Connections:   . Frequency of Communication with Friends and Family:   . Frequency of Social Gatherings with Friends and Family:   . Attends Religious Services:   . Active Member of Clubs or Organizations:   . Attends Archivist Meetings:   Marland Kitchen Marital Status:   Intimate Partner Violence:   . Fear of Current or Ex-Partner:   . Emotionally Abused:   Marland Kitchen Physically Abused:   . Sexually Abused:       Review of Systems  Constitutional: Positive for fatigue. Negative for appetite change, chills, fever and unexpected weight change.  HENT: Positive for ear pain. Negative for congestion, ear discharge, facial swelling, rhinorrhea, sinus pressure, sinus pain, sore throat, trouble swallowing and voice change.        Jaw pain. Pain so severe, it is inturrupting her ability to sleep.   Respiratory: Negative for cough, choking, chest tightness, shortness of breath and wheezing.   Cardiovascular: Negative for chest pain and palpitations.       Blood pressure is well controlled.   Gastrointestinal: Negative for abdominal distention, abdominal pain, blood in stool, constipation, diarrhea, nausea and vomiting.  Endocrine: Negative for cold intolerance, heat intolerance, polydipsia and polyuria.  Musculoskeletal: Positive for gait problem. Negative for arthralgias, back pain, neck pain and neck stiffness.  Skin: Negative for color change, pallor, rash and wound.   Allergic/Immunologic: Positive for environmental allergies.  Neurological: Positive for tremors and weakness. Negative for dizziness, syncope, facial asymmetry, light-headedness, numbness and headaches.       Recently worsening.   Hematological: Negative for adenopathy.  Psychiatric/Behavioral: Positive for sleep disturbance.    Today's Vitals   07/13/19 1123  BP: 135/74  Pulse: 80  Resp: 16  Temp: 98 F (36.7 C)  SpO2: 96%  Weight: 176 lb 8 oz (80.1 kg)  Height: 5\' 3"  (1.6 m)   Body mass index is 31.27 kg/m.  Physical Exam Vitals and nursing note reviewed.  Constitutional:      General: She is not in acute distress.    Appearance: Normal appearance. She is well-developed. She is not diaphoretic.  HENT:     Head: Normocephalic and atraumatic.     Right Ear: No tenderness. Tympanic membrane is not erythematous or bulging.     Left Ear: No tenderness. Tympanic membrane is not erythematous or bulging.     Mouth/Throat:     Pharynx: No oropharyngeal exudate.     Comments: Tenderness with palpation of the aw, just superior to the ears. No clicking or popping can be felt with clenching of the teeth. There is some swelling which is noted.  Eyes:     General: No scleral icterus.       Right eye: No discharge.        Left eye: No discharge.     Pupils: Pupils are equal, round, and reactive to light.  Neck:     Thyroid: No thyromegaly.     Vascular: No JVD.     Trachea: No tracheal deviation.  Cardiovascular:     Rate and Rhythm: Normal rate and regular rhythm.     Heart sounds: Normal heart sounds. No murmur heard.  No friction rub. No gallop.   Pulmonary:     Effort: Pulmonary effort is normal. No respiratory distress.     Breath sounds: Normal breath sounds. No wheezing or rales.  Chest:     Chest wall: No mass or tenderness.  Abdominal:     Palpations: Abdomen is  soft.  Musculoskeletal:        General: Deformity present. No tenderness. Normal range of motion.      Cervical back: Normal range of motion and neck supple.     Comments: Right leg due to post polio  Lymphadenopathy:     Cervical: No cervical adenopathy.  Skin:    General: Skin is warm and dry.     Capillary Refill: Capillary refill takes less than 2 seconds.     Findings: No erythema or rash.  Neurological:     Mental Status: She is alert and oriented to person, place, and time. Mental status is at baseline.     Gait: Gait abnormal.     Comments: Tremor in upper extremities a bit more coarse than at prior visits. There is some involvement with the head and trunk also.   Psychiatric:        Mood and Affect: Mood normal.        Behavior: Behavior normal.        Thought Content: Thought content normal.        Judgment: Judgment normal.   Assessment/Plan: 1. TMJ (temporomandibular joint disorder) Add tizanidine 2mg  up to twice daily as needed for muscle pain and spasms. She should take this medication with caution as it may cause dizziness and drowsiness. She voiced understanding and agreement.  She should also take tylenol/ibuprofen as needed and as indicated for pain. She should contact her dentist for further evaluation and treatment.  - tiZANidine (ZANAFLEX) 2 MG tablet; Take 1 tablet (2 mg total) by mouth 2 (two) times daily as needed for muscle spasms.  Dispense: 45 tablet; Refill: 1  2. Essential hypertension Well controlled. Continue bp medication as prescribed  - losartan-hydrochlorothiazide (HYZAAR) 100-25 MG tablet; Take 1 tablet by mouth daily.  Dispense: 90 tablet; Refill: 1  3. Benign essential tremor Worsening. Advised she discuss new and worsening symptoms with neurology at next appointment. Consider contacting provider sooner if this continues to worsen.   4. Weakness Continue with regular physical therapy as scheduled.   General Counseling: Kyrie verbalizes understanding of the findings of todays visit and agrees with plan of treatment. I have discussed any further  diagnostic evaluation that may be needed or ordered today. We also reviewed her medications today. she has been encouraged to call the office with any questions or concerns that should arise related to todays visit.   This patient was seen by [place] with Dr Lavera Guise as a part of collaborative care agreement  Meds ordered this encounter  Medications  . losartan-hydrochlorothiazide (HYZAAR) 100-25 MG tablet    Sig: Take 1 tablet by mouth daily.    Dispense:  90 tablet    Refill:  1    Order Specific Question:   Supervising Provider    Answer:   Lavera Guise [[id number]]  . tiZANidine (ZANAFLEX) 2 MG tablet    Sig: Take 1 tablet (2 mg total) by mouth 2 (two) times daily as needed for muscle spasms.    Dispense:  45 tablet    Refill:  1    Order Specific Question:   Supervising Provider    Answer:   Lavera Guise [[id number]]    Total time spent: 30 Minutes   Time spent includes review of chart, medications, test results, and follow up plan with the patient.      Dr Lavera Guise Internal medicine

## 2019-07-21 ENCOUNTER — Other Ambulatory Visit: Payer: Self-pay

## 2019-07-21 DIAGNOSIS — M26609 Unspecified temporomandibular joint disorder, unspecified side: Secondary | ICD-10-CM | POA: Insufficient documentation

## 2019-07-21 DIAGNOSIS — R531 Weakness: Secondary | ICD-10-CM | POA: Insufficient documentation

## 2019-07-21 MED ORDER — OXYBUTYNIN CHLORIDE ER 10 MG PO TB24: 20.0000 mg | ORAL_TABLET | Freq: Every day | ORAL | 0 refills | Status: DC

## 2019-07-27 DIAGNOSIS — R42 Dizziness and giddiness: Secondary | ICD-10-CM | POA: Diagnosis not present

## 2019-07-27 DIAGNOSIS — R29898 Other symptoms and signs involving the musculoskeletal system: Secondary | ICD-10-CM | POA: Diagnosis not present

## 2019-07-27 DIAGNOSIS — G4733 Obstructive sleep apnea (adult) (pediatric): Secondary | ICD-10-CM | POA: Diagnosis not present

## 2019-07-27 DIAGNOSIS — Z9989 Dependence on other enabling machines and devices: Secondary | ICD-10-CM | POA: Diagnosis not present

## 2019-07-27 DIAGNOSIS — R4189 Other symptoms and signs involving cognitive functions and awareness: Secondary | ICD-10-CM | POA: Diagnosis not present

## 2019-07-27 DIAGNOSIS — R519 Headache, unspecified: Secondary | ICD-10-CM | POA: Diagnosis not present

## 2019-07-27 DIAGNOSIS — R251 Tremor, unspecified: Secondary | ICD-10-CM | POA: Diagnosis not present

## 2019-08-17 ENCOUNTER — Telehealth: Payer: Self-pay

## 2019-08-17 NOTE — Telephone Encounter (Signed)
Confirmed and screened for 08-19-19 ov. 

## 2019-08-19 ENCOUNTER — Encounter: Payer: Self-pay | Admitting: Nurse Practitioner

## 2019-08-19 ENCOUNTER — Other Ambulatory Visit: Payer: Self-pay

## 2019-08-19 ENCOUNTER — Ambulatory Visit (INDEPENDENT_AMBULATORY_CARE_PROVIDER_SITE_OTHER): Payer: Medicare Other | Admitting: Nurse Practitioner

## 2019-08-19 VITALS — BP 149/70 | HR 75 | Temp 97.4°F | Resp 16 | Ht 63.0 in | Wt 174.0 lb

## 2019-08-19 DIAGNOSIS — G250 Essential tremor: Secondary | ICD-10-CM

## 2019-08-19 DIAGNOSIS — Z9989 Dependence on other enabling machines and devices: Secondary | ICD-10-CM

## 2019-08-19 DIAGNOSIS — M26609 Unspecified temporomandibular joint disorder, unspecified side: Secondary | ICD-10-CM

## 2019-08-19 DIAGNOSIS — I10 Essential (primary) hypertension: Secondary | ICD-10-CM

## 2019-08-19 DIAGNOSIS — G4733 Obstructive sleep apnea (adult) (pediatric): Secondary | ICD-10-CM | POA: Diagnosis not present

## 2019-08-19 MED ORDER — HYDROCODONE-ACETAMINOPHEN 5-325 MG PO TABS: 1.0000 | ORAL_TABLET | ORAL | 0 refills | Status: DC | PRN

## 2019-08-19 NOTE — Progress Notes (Signed)
[facility] [place], [place] [address]  Internal MEDICINE  Office Visit Note  Patient Name: Susan Shah  [id number]  [id number]  Date of Service: 09/12/2019  Chief Complaint  Patient presents with  . Follow-up  . Hypertension  . Hyperlipidemia  . Quality Metric Gaps    tdap    The patient is here for routine follow up. Blood pressure is well controlled. Continues to have pain in the jaw. The teeth she had worked on have healed. The pain can be so bad that it keeps her and her husband awake at night. In the past, a short term prescription for hydrocodone/APAP 5/325mg  tablets was given. She took this mostly at night. She states that this helped so much.  She has noted worsening tremor. She does see neurology due to muscle weakness and tremor. She states that at her most recent visit with neurology, increased tremor and weakness were not problematic. This has been new development.        Current Medication: Outpatient Encounter Medications as of 08/19/2019  Medication Sig Note  . acetaminophen (TYLENOL) 500 MG tablet Take 1,000 mg by mouth every 6 (six) hours as needed for moderate pain or headache.   Marland Kitchen amLODipine (NORVASC) 2.5 MG tablet Take 1 tablet (2.5 mg total) by mouth daily.   . Carboxymethylcellul-Glycerin (LUBRICATING EYE DROPS OP) Place 1 drop into both eyes daily as needed (dry eyes).   . cholecalciferol (VITAMIN D3) 25 MCG (1000 UNIT) tablet Take 1,000 Units by mouth daily.   Marland Kitchen escitalopram (LEXAPRO) 10 MG tablet TAKE ONE TABLET BY MOUTH EVERY DAY AT 5-7 PM   . fluticasone (FLONASE) 50 MCG/ACT nasal spray Place 1 spray into both nostrils at bedtime as needed for allergies or rhinitis.   Marland Kitchen HYDROcodone-acetaminophen (NORCO) 5-325 MG tablet Take 1 tablet by mouth every 4 (four) hours as needed for moderate pain.   Marland Kitchen loratadine (QC LORATADINE ALLERGY RELIEF) 10 MG tablet Take 1 tablet (10 mg total) by mouth daily.   Marland Kitchen losartan-hydrochlorothiazide  (HYZAAR) 100-25 MG tablet Take 1 tablet by mouth daily.   . metoprolol tartrate (LOPRESSOR) 25 MG tablet Take 1 tablet (25 mg total) by mouth 2 (two) times daily with a meal.   . NON FORMULARY cpap device   . oxybutynin (DITROPAN-XL) 10 MG 24 hr tablet Take 2 tablets (20 mg total) by mouth daily.   . pantoprazole (PROTONIX) 40 MG tablet Take 1 tablet (40 mg total) by mouth 2 (two) times daily.   . potassium chloride SA (KLOR-CON) 20 MEQ tablet Take 1 tablet (20 mEq total) by mouth daily.   Marland Kitchen rOPINIRole (REQUIP) 0.5 MG tablet TAKE 1 TABLET BY MOUTH TWICE A DAY AS NEEDED 08/19/2019: Pt takes 1/4 tablet a day  . simvastatin (ZOCOR) 20 MG tablet Take 1 tablet (20 mg total) by mouth daily at 6 PM.   . vitamin B-12 (CYANOCOBALAMIN) 1000 MCG tablet Take 1,000 mcg by mouth daily.   . [DISCONTINUED] buPROPion (WELLBUTRIN XL) 150 MG 24 hr tablet Take 1 tablet (150 mg total) by mouth every morning.   . [DISCONTINUED] HYDROcodone-acetaminophen (NORCO) 5-325 MG tablet Take 1 tablet by mouth every 4 (four) hours as needed for moderate pain.   . [DISCONTINUED] tiZANidine (ZANAFLEX) 2 MG tablet Take 1 tablet (2 mg total) by mouth 2 (two) times daily as needed for muscle spasms.    No facility-administered encounter medications on file as of 08/19/2019.    Surgical History: Past Surgical History:  Procedure Laterality Date  . [place] STUDY N/A 04/24/2016   Procedure: 65 HOUR PH STUDY;  Surgeon: Lucilla Lame, MD;  Location: [facility] ENDOSCOPY;  Service: Endoscopy;  Laterality: N/A;  . ABDOMINAL HYSTERECTOMY     Total  . APPENDECTOMY    . CARPOMETACARPAL ([place]) FUSION OF THUMB Left 05/16/2016   Procedure: CARPOMETACARPAL [facility]) FUSION OF THUMB;  Surgeon: Hessie Knows, MD;  Location: [facility] ORS;  Service: Orthopedics;  Laterality: Left;  . CARPOMETACARPAL ([place]) FUSION OF THUMB Left 03/30/2019   Procedure: LEFT THUMB SUSPENSION PLASTY;  Surgeon: Hessie Knows, MD;  Location: [facility] ORS;  Service: Orthopedics;  Laterality:  Left;  . CATARACT EXTRACTION Bilateral 12/2012  . CHOLECYSTECTOMY    . COLONOSCOPY  2003  . ESOPHAGEAL MANOMETRY N/A 04/24/2016   Procedure: ESOPHAGEAL MANOMETRY (EM);  Surgeon: Lucilla Lame, MD;  Location: [facility] ENDOSCOPY;  Service: Endoscopy;  Laterality: N/A;  . ESOPHAGOGASTRODUODENOSCOPY  08/2011  . FOOT SURGERY    . HALLUX VALGUS CORRECTION    . HIP SURGERY Right 1950   tendon release r/t polio  . KNEE ARTHROSCOPY Left 06/23/2008  . RETINAL DETACHMENT SURGERY Right 03/2014  . ROTATOR CUFF REPAIR Bilateral   . URINARY SURGERY  2014   [place]    Medical History: Past Medical History:  Diagnosis Date  . Anxiety   . Asthma 2015   mild, seasonal allergy triggered.  . Cancer ([place]) 2013   skin cancer  on left hand  . Depression   . Esophageal dysmotility   . GERD (gastroesophageal reflux disease) 11/05/2012  . History of hiatal hernia   . Hyperlipidemia   . Hypertension   . Incontinence of urine   . Lung nodule   . OA (osteoarthritis)   . Obesity   . PONV (postoperative nausea and vomiting)   . Post-polio syndrome    contracted at 58 months old  . Pulmonary nodule 11/05/2012   RML  . Shingles 2009  . Sleep apnea     Family History: Family History  Problem Relation Age of Onset  . Heart disease Mother   . Heart disease Father   . Heart disease Brother   . Stroke Maternal Grandmother   . Heart disease Maternal Grandfather   . Heart attack Brother     Social History   Socioeconomic History  . Marital status: Married    Spouse name: Not on file  . Number of children: Not on file  . Years of education: Not on file  . Highest education level: Not on file  Occupational History  . Not on file  Tobacco Use  . Smoking status: Former Smoker    Quit date: 05/09/1968    Years since quitting: 51.3  . Smokeless tobacco: Never Used  Vaping Use  . Vaping Use: Never used  Substance and Sexual Activity  . Alcohol use: No  . Drug use: No  . Sexual activity: Not on  file  Other Topics Concern  . Not on file  Social History Narrative  . Not on file   Social Determinants of Health   Financial Resource Strain:   . Difficulty of Paying Living Expenses: Not on file  Food Insecurity:   . Worried About Charity fundraiser in the Last Year: Not on file  . Ran Out of Food in the Last Year: Not on file  Transportation Needs:   . Lack of Transportation (Medical): Not on file  . Lack of Transportation (Non-Medical): Not on file  Physical Activity:   .  Days of Exercise per Week: Not on file  . Minutes of Exercise per Session: Not on file  Stress:   . Feeling of Stress : Not on file  Social Connections:   . Frequency of Communication with Friends and Family: Not on file  . Frequency of Social Gatherings with Friends and Family: Not on file  . Attends Religious Services: Not on file  . Active Member of Clubs or Organizations: Not on file  . Attends Archivist Meetings: Not on file  . Marital Status: Not on file  Intimate Partner Violence:   . Fear of Current or Ex-Partner: Not on file  . Emotionally Abused: Not on file  . Physically Abused: Not on file  . Sexually Abused: Not on file      Review of Systems  Constitutional: Positive for fatigue. Negative for appetite change, chills, fever and unexpected weight change.  HENT: Positive for ear pain. Negative for congestion, ear discharge, facial swelling, rhinorrhea, sinus pressure, sinus pain, sore throat, trouble swallowing and voice change.        Continues to severe Jaw pain. Pain so severe, it is inturrupting her ability to sleep.   Respiratory: Negative for cough, choking, chest tightness, shortness of breath and wheezing.   Cardiovascular: Negative for chest pain and palpitations.       Improved and stable blood pressure.   Gastrointestinal: Negative for abdominal distention, abdominal pain, blood in stool, constipation, diarrhea, nausea and vomiting.  Endocrine: Negative for cold  intolerance, heat intolerance, polydipsia and polyuria.  Musculoskeletal: Positive for gait problem. Negative for arthralgias, back pain, neck pain and neck stiffness.  Skin: Negative for color change, pallor, rash and wound.  Allergic/Immunologic: Positive for environmental allergies.  Neurological: Positive for tremors and weakness. Negative for dizziness, syncope, facial asymmetry, light-headedness, numbness and headaches.       Recently worsening.   Hematological: Negative for adenopathy.  Psychiatric/Behavioral: Positive for sleep disturbance.    Today's Vitals   08/19/19 1343  BP: (!) 149/70  Pulse: 75  Resp: 16  Temp: (!) 97.4 F (36.3 C)  SpO2: 95%  Weight: 174 lb (78.9 kg)  Height: 5\' 3"  (1.6 m)   Body mass index is 30.82 kg/m.  Physical Exam Vitals and nursing note reviewed.  Constitutional:      General: She is not in acute distress.    Appearance: Normal appearance. She is well-developed. She is not diaphoretic.  HENT:     Head: Normocephalic and atraumatic.     Right Ear: No tenderness. Tympanic membrane is not erythematous or bulging.     Left Ear: No tenderness. Tympanic membrane is not erythematous or bulging.     Mouth/Throat:     Pharynx: No oropharyngeal exudate.     Comments: Tenderness with palpation of the jaw, just anterior to the ears. No clicking or popping can be felt with clenching of the teeth. There is some swelling which is noted.  Eyes:     General: No scleral icterus.       Right eye: No discharge.        Left eye: No discharge.     Pupils: Pupils are equal, round, and reactive to light.  Neck:     Thyroid: No thyromegaly.     Vascular: No JVD.     Trachea: No tracheal deviation.  Cardiovascular:     Rate and Rhythm: Normal rate and regular rhythm.     Heart sounds: Normal heart sounds. No murmur heard.  No friction rub. No gallop.   Pulmonary:     Effort: Pulmonary effort is normal. No respiratory distress.     Breath sounds: Normal  breath sounds. No wheezing or rales.  Chest:     Chest wall: No mass or tenderness.  Abdominal:     Palpations: Abdomen is soft.  Musculoskeletal:        General: Deformity present. No tenderness. Normal range of motion.     Cervical back: Normal range of motion and neck supple.     Comments: Right leg due to post polio  Lymphadenopathy:     Cervical: No cervical adenopathy.  Skin:    General: Skin is warm and dry.     Capillary Refill: Capillary refill takes less than 2 seconds.     Findings: No erythema or rash.  Neurological:     Mental Status: She is alert and oriented to person, place, and time. Mental status is at baseline.     Gait: Gait abnormal.     Comments: Tremor in upper extremities a bit more coarse than at prior visits. There is some involvement with the head and trunk also.   Psychiatric:        Mood and Affect: Mood normal.        Behavior: Behavior normal.        Thought Content: Thought content normal.        Judgment: Judgment normal.    Assessment/Plan: 1. Essential hypertension Currently stable. Continue bp medication as prescribed   2. TMJ (temporomandibular joint disorder) Will give short term prescription for hydrocodone/APAP 5/325mg  tablets. These are prescribed as four times daily if needed. Patient is to take these at night only. She voiced understanding and agreement with this plan.  - HYDROcodone-acetaminophen (NORCO) 5-325 MG tablet; Take 1 tablet by mouth every 4 (four) hours as needed for moderate pain.  Dispense: 30 tablet; Refill: 0  3. Benign essential tremor Continue regular visits with neurology as scheduled.   4. OSA on CPAP Continue regular visits with Dr. Devona Konig as scheduled for CPAP management.   General Counseling: Marieke verbalizes understanding of the findings of todays visit and agrees with plan of treatment. I have discussed any further diagnostic evaluation that may be needed or ordered today. We also reviewed her medications  today. she has been encouraged to call the office with any questions or concerns that should arise related to todays visit.   Hypertension Counseling:   The following hypertensive lifestyle modification were recommended and discussed:  1. Limiting alcohol intake to less than 1 oz/day of ethanol:(24 oz of beer or 8 oz of wine or 2 oz of 100-proof whiskey). 2. Take baby ASA 81 mg daily. 3. Importance of regular aerobic exercise and losing weight. 4. Reduce dietary saturated fat and cholesterol intake for overall cardiovascular health. 5. Maintaining adequate dietary potassium, calcium, and magnesium intake. 6. Regular monitoring of the blood pressure. 7. Reduce sodium intake to less than 100 mmol/day (less than 2.3 gm of sodium or less than 6 gm of sodium choride)   This patient was seen by [place] with Dr Lavera Guise as a part of collaborative care agreement  Meds ordered this encounter  Medications  . HYDROcodone-acetaminophen (NORCO) 5-325 MG tablet    Sig: Take 1 tablet by mouth every 4 (four) hours as needed for moderate pain.    Dispense:  30 tablet    Refill:  0    Order Specific Question:  Supervising Provider    Answer:   Lavera Guise [[id number]]    Total time spent: 30 Minutes   Time spent includes review of chart, medications, test results, and follow up plan with the patient.      Dr Lavera Guise Internal medicine

## 2019-09-03 ENCOUNTER — Other Ambulatory Visit: Payer: Self-pay

## 2019-09-03 DIAGNOSIS — M26609 Unspecified temporomandibular joint disorder, unspecified side: Secondary | ICD-10-CM

## 2019-09-03 MED ORDER — TIZANIDINE HCL 2 MG PO TABS: 2.0000 mg | ORAL_TABLET | Freq: Two times a day (BID) | ORAL | 1 refills | Status: DC | PRN

## 2019-09-07 ENCOUNTER — Other Ambulatory Visit: Payer: Self-pay

## 2019-09-07 MED ORDER — BUPROPION HCL ER (XL) 150 MG PO TB24: 150.0000 mg | ORAL_TABLET | ORAL | 1 refills | Status: DC

## 2019-09-20 ENCOUNTER — Other Ambulatory Visit: Payer: Self-pay

## 2019-09-20 ENCOUNTER — Ambulatory Visit (INDEPENDENT_AMBULATORY_CARE_PROVIDER_SITE_OTHER): Payer: Medicare Other | Admitting: Nurse Practitioner

## 2019-09-20 VITALS — BP 152/79 | HR 89 | Temp 98.0°F | Resp 16 | Ht 63.0 in | Wt 171.0 lb

## 2019-09-20 DIAGNOSIS — R112 Nausea with vomiting, unspecified: Secondary | ICD-10-CM | POA: Diagnosis not present

## 2019-09-20 DIAGNOSIS — R300 Dysuria: Secondary | ICD-10-CM | POA: Diagnosis not present

## 2019-09-20 DIAGNOSIS — R319 Hematuria, unspecified: Secondary | ICD-10-CM | POA: Diagnosis not present

## 2019-09-20 DIAGNOSIS — N390 Urinary tract infection, site not specified: Secondary | ICD-10-CM

## 2019-09-20 DIAGNOSIS — M26609 Unspecified temporomandibular joint disorder, unspecified side: Secondary | ICD-10-CM | POA: Diagnosis not present

## 2019-09-20 LAB — POCT URINALYSIS DIPSTICK
Bilirubin, UA: NEGATIVE
Glucose, UA: NEGATIVE
Nitrite, UA: POSITIVE
Protein, UA: POSITIVE — AB
Spec Grav, UA: 1.01 (ref 1.010–1.025)
Urobilinogen, UA: NEGATIVE E.U./dL — AB
pH, UA: 7 (ref 5.0–8.0)

## 2019-09-20 MED ORDER — METOPROLOL TARTRATE 25 MG PO TABS: 25.0000 mg | ORAL_TABLET | Freq: Two times a day (BID) | ORAL | 3 refills | Status: DC

## 2019-09-20 MED ORDER — ONDANSETRON 4 MG PO TBDP: 4.0000 mg | ORAL_TABLET | Freq: Three times a day (TID) | ORAL | 1 refills | Status: DC | PRN

## 2019-09-20 MED ORDER — HYDROCODONE-ACETAMINOPHEN 5-325 MG PO TABS: 1.0000 | ORAL_TABLET | ORAL | 0 refills | Status: DC | PRN

## 2019-09-20 MED ORDER — SULFAMETHOXAZOLE-TRIMETHOPRIM 400-80 MG PO TABS: 1.0000 | ORAL_TABLET | Freq: Two times a day (BID) | ORAL | 0 refills | Status: DC

## 2019-09-20 NOTE — Progress Notes (Signed)
[facility] [place], [place] [address]  Internal MEDICINE  Office Visit Note  Patient Name: Susan Shah  [id number]  [id number]  Date of Service: 10/03/2019   Pt is here for a sick visit.   Chief Complaint  Patient presents with  . Acute Visit    possible uti  . Urinary Tract Infection  . Headache     Urinary Tract Infection  This is a new problem. The current episode started in the past 7 days. The problem occurs every urination. The problem has been gradually worsening. The quality of the pain is described as burning. There has been no fever. She is sexually active. There is no history of pyelonephritis. Associated symptoms include chills, flank pain, nausea and urgency. Pertinent negatives include no vomiting. She has tried nothing for the symptoms.   The patient is also reporting significant flank/lower back pain as well as headache with this infection. She has had to use her hydrocodone/APAP to help relieve the pain and this is not helping that much.     Current Medication:  Outpatient Encounter Medications as of 09/20/2019  Medication Sig Note  . acetaminophen (TYLENOL) 500 MG tablet Take 1,000 mg by mouth every 6 (six) hours as needed for moderate pain or headache.   Marland Kitchen amLODipine (NORVASC) 2.5 MG tablet Take 1 tablet (2.5 mg total) by mouth daily.   Marland Kitchen buPROPion (WELLBUTRIN XL) 150 MG 24 hr tablet Take 1 tablet (150 mg total) by mouth every morning.   . Carboxymethylcellul-Glycerin (LUBRICATING EYE DROPS OP) Place 1 drop into both eyes daily as needed (dry eyes).   . cholecalciferol (VITAMIN D3) 25 MCG (1000 UNIT) tablet Take 1,000 Units by mouth daily.   Marland Kitchen escitalopram (LEXAPRO) 10 MG tablet TAKE ONE TABLET BY MOUTH EVERY DAY AT 5-7 PM   . fluticasone (FLONASE) 50 MCG/ACT nasal spray Place 1 spray into both nostrils at bedtime as needed for allergies or rhinitis.   Marland Kitchen HYDROcodone-acetaminophen (NORCO) 5-325 MG tablet Take 1 tablet by mouth  every 4 (four) hours as needed for moderate pain.   Marland Kitchen loratadine (QC LORATADINE ALLERGY RELIEF) 10 MG tablet Take 1 tablet (10 mg total) by mouth daily.   Marland Kitchen losartan-hydrochlorothiazide (HYZAAR) 100-25 MG tablet Take 1 tablet by mouth daily.   . metoprolol tartrate (LOPRESSOR) 25 MG tablet Take 1 tablet (25 mg total) by mouth 2 (two) times daily with a meal.   . NON FORMULARY cpap device   . oxybutynin (DITROPAN-XL) 10 MG 24 hr tablet Take 2 tablets (20 mg total) by mouth daily.   . pantoprazole (PROTONIX) 40 MG tablet Take 1 tablet (40 mg total) by mouth 2 (two) times daily.   . potassium chloride SA (KLOR-CON) 20 MEQ tablet Take 1 tablet (20 mEq total) by mouth daily.   Marland Kitchen tiZANidine (ZANAFLEX) 2 MG tablet Take 1 tablet (2 mg total) by mouth 2 (two) times daily as needed for muscle spasms.   . vitamin B-12 (CYANOCOBALAMIN) 1000 MCG tablet Take 1,000 mcg by mouth daily.   . [DISCONTINUED] HYDROcodone-acetaminophen (NORCO) 5-325 MG tablet Take 1 tablet by mouth every 4 (four) hours as needed for moderate pain.   . [DISCONTINUED] rOPINIRole (REQUIP) 0.5 MG tablet TAKE 1 TABLET BY MOUTH TWICE A DAY AS NEEDED 08/19/2019: Pt takes 1/4 tablet a day  . [DISCONTINUED] simvastatin (ZOCOR) 20 MG tablet Take 1 tablet (20 mg total) by mouth daily at 6 PM.   . ondansetron (ZOFRAN-ODT) 4 MG disintegrating tablet  Take 1 tablet (4 mg total) by mouth every 8 (eight) hours as needed for nausea or vomiting.   . sulfamethoxazole-trimethoprim (BACTRIM) 400-80 MG tablet Take 1 tablet by mouth 2 (two) times daily.    No facility-administered encounter medications on file as of 09/20/2019.      Medical History: Past Medical History:  Diagnosis Date  . Anxiety   . Asthma 2015   mild, seasonal allergy triggered.  . Cancer ([place]) 2013   skin cancer  on left hand  . Depression   . Esophageal dysmotility   . GERD (gastroesophageal reflux disease) 11/05/2012  . History of hiatal hernia   . Hyperlipidemia   .  Hypertension   . Incontinence of urine   . Lung nodule   . OA (osteoarthritis)   . Obesity   . PONV (postoperative nausea and vomiting)   . Post-polio syndrome    contracted at 31 months old  . Pulmonary nodule 11/05/2012   RML  . Shingles 2009  . Sleep apnea      Today's Vitals   09/20/19 1438  BP: (!) 152/79  Pulse: 89  Resp: 16  Temp: 98 F (36.7 C)  SpO2: 91%  Weight: 171 lb (77.6 kg)  Height: 5\' 3"  (1.6 m)   Body mass index is 30.29 kg/m.  Review of Systems  Constitutional: Positive for chills.  HENT: Positive for dental problem.          Continues to severe Jaw pain. Pain so severe, it is inturrupting her ability to sleep.    Respiratory: Negative for chest tightness, shortness of breath and wheezing.   Cardiovascular: Negative for chest pain and palpitations.  Gastrointestinal: Positive for nausea. Negative for vomiting.  Genitourinary: Positive for flank pain and urgency.  Musculoskeletal: Positive for back pain.  Skin: Negative.   Allergic/Immunologic: Negative for environmental allergies.  Neurological: Negative for dizziness and headaches.  Psychiatric/Behavioral: Negative.     Physical Exam Vitals and nursing note reviewed.  Constitutional:      General: She is not in acute distress.    Appearance: Normal appearance. She is well-developed. She is not diaphoretic.  HENT:     Head: Normocephalic and atraumatic.     Nose: Nose normal.     Mouth/Throat:     Pharynx: No oropharyngeal exudate.  Eyes:     Pupils: Pupils are equal, round, and reactive to light.  Neck:     Thyroid: No thyromegaly.     Vascular: No JVD.     Trachea: No tracheal deviation.  Cardiovascular:     Rate and Rhythm: Normal rate and regular rhythm.     Heart sounds: Normal heart sounds. No murmur heard.  No friction rub. No gallop.   Pulmonary:     Effort: Pulmonary effort is normal. No respiratory distress.     Breath sounds: Normal breath sounds. No wheezing or rales.   Chest:     Chest wall: No tenderness.  Abdominal:     General: Bowel sounds are normal.     Palpations: Abdomen is soft.     Tenderness: There is abdominal tenderness.     Comments: Mild, generalized abdominal tenderness.   Genitourinary:    Comments: Urine sample is positive for large blood and large WBC today. Also positive for protein and ketones.  Musculoskeletal:        General: Normal range of motion.     Cervical back: Normal range of motion and neck supple.  Lymphadenopathy:  Cervical: No cervical adenopathy.  Skin:    General: Skin is warm and dry.  Neurological:     Mental Status: She is alert and oriented to person, place, and time.     Cranial Nerves: No cranial nerve deficit.  Psychiatric:        Mood and Affect: Mood normal.        Behavior: Behavior normal.        Thought Content: Thought content normal.        Judgment: Judgment normal.    Assessment/Plan: 1. Urinary tract infection with hematuria, site unspecified Treat with bactrim twice daily for 10 days. Send urine sample for culture and sensitivity and will adjust antibiotic treatment as indicated.  - sulfamethoxazole-trimethoprim (BACTRIM) 400-80 MG tablet; Take 1 tablet by mouth 2 (two) times daily.  Dispense: 20 tablet; Refill: 0  2. Non-intractable vomiting with nausea, unspecified vomiting type zofran 4mg  disintegrating tablets may be taken up to three times daily as needed for nausea. BRAT diet recommended.  - ondansetron (ZOFRAN-ODT) 4 MG disintegrating tablet; Take 1 tablet (4 mg total) by mouth every 8 (eight) hours as needed for nausea or vomiting.  Dispense: 30 tablet; Refill: 1  3. TMJ (temporomandibular joint disorder) May take hydrocodone/APAP 5/325mg  tablets as needed and as prescribed for pain control. New prescription sent to her pharmacy today.  - HYDROcodone-acetaminophen (NORCO) 5-325 MG tablet; Take 1 tablet by mouth every 4 (four) hours as needed for moderate pain.  Dispense: 30  tablet; Refill: 0  4. Dysuria Treat for infection and adjust therapy as indicated.  - POCT Urinalysis Dipstick - CULTURE, URINE COMPREHENSIVE  General Counseling: Yamila verbalizes understanding of the findings of todays visit and agrees with plan of treatment. I have discussed any further diagnostic evaluation that may be needed or ordered today. We also reviewed her medications today. she has been encouraged to call the office with any questions or concerns that should arise related to todays visit.    Counseling:  This patient was seen by Leretha Pol FNP Collaboration with Dr Lavera Guise as a part of collaborative care agreement  Orders Placed This Encounter  Procedures  . CULTURE, URINE COMPREHENSIVE  . POCT Urinalysis Dipstick    Meds ordered this encounter  Medications  . sulfamethoxazole-trimethoprim (BACTRIM) 400-80 MG tablet    Sig: Take 1 tablet by mouth 2 (two) times daily.    Dispense:  20 tablet    Refill:  0    Order Specific Question:   Supervising Provider    Answer:   Lavera Guise [[id number]]  . ondansetron (ZOFRAN-ODT) 4 MG disintegrating tablet    Sig: Take 1 tablet (4 mg total) by mouth every 8 (eight) hours as needed for nausea or vomiting.    Dispense:  30 tablet    Refill:  1    Order Specific Question:   Supervising Provider    Answer:   Lavera Guise [[id number]]  . HYDROcodone-acetaminophen (NORCO) 5-325 MG tablet    Sig: Take 1 tablet by mouth every 4 (four) hours as needed for moderate pain.    Dispense:  30 tablet    Refill:  0    Order Specific Question:   Supervising Provider    Answer:   Lavera Guise [[id number]]    Time spent: 30 Minutes

## 2019-09-21 ENCOUNTER — Other Ambulatory Visit: Payer: Self-pay | Admitting: Adult Health

## 2019-09-23 ENCOUNTER — Other Ambulatory Visit: Payer: Self-pay | Admitting: Internal Medicine

## 2019-09-23 DIAGNOSIS — G250 Essential tremor: Secondary | ICD-10-CM

## 2019-09-24 LAB — CULTURE, URINE COMPREHENSIVE

## 2019-09-24 NOTE — Progress Notes (Signed)
Patient was treated with bactrim at time of visit.

## 2019-10-03 ENCOUNTER — Encounter: Payer: Self-pay | Admitting: Nurse Practitioner

## 2019-10-03 DIAGNOSIS — N390 Urinary tract infection, site not specified: Secondary | ICD-10-CM | POA: Insufficient documentation

## 2019-10-03 DIAGNOSIS — R1110 Vomiting, unspecified: Secondary | ICD-10-CM | POA: Insufficient documentation

## 2019-10-03 DIAGNOSIS — R300 Dysuria: Secondary | ICD-10-CM | POA: Insufficient documentation

## 2019-10-04 ENCOUNTER — Telehealth: Payer: Self-pay

## 2019-10-04 NOTE — Telephone Encounter (Signed)
Confirmed and screened for 10-06-19 ov.

## 2019-10-06 ENCOUNTER — Ambulatory Visit (INDEPENDENT_AMBULATORY_CARE_PROVIDER_SITE_OTHER): Payer: Medicare Other | Admitting: Hospice and Palliative Medicine

## 2019-10-06 ENCOUNTER — Other Ambulatory Visit: Payer: Self-pay

## 2019-10-06 ENCOUNTER — Other Ambulatory Visit: Payer: Self-pay | Admitting: Hospice and Palliative Medicine

## 2019-10-06 ENCOUNTER — Encounter: Payer: Self-pay | Admitting: Hospice and Palliative Medicine

## 2019-10-06 VITALS — BP 132/68 | HR 71 | Temp 97.6°F | Resp 16 | Ht 63.0 in | Wt 171.0 lb

## 2019-10-06 DIAGNOSIS — G250 Essential tremor: Secondary | ICD-10-CM | POA: Diagnosis not present

## 2019-10-06 DIAGNOSIS — K219 Gastro-esophageal reflux disease without esophagitis: Secondary | ICD-10-CM | POA: Diagnosis not present

## 2019-10-06 DIAGNOSIS — N390 Urinary tract infection, site not specified: Secondary | ICD-10-CM

## 2019-10-06 DIAGNOSIS — R319 Hematuria, unspecified: Secondary | ICD-10-CM | POA: Diagnosis not present

## 2019-10-06 DIAGNOSIS — R300 Dysuria: Secondary | ICD-10-CM | POA: Diagnosis not present

## 2019-10-06 DIAGNOSIS — I10 Essential (primary) hypertension: Secondary | ICD-10-CM

## 2019-10-06 DIAGNOSIS — M26609 Unspecified temporomandibular joint disorder, unspecified side: Secondary | ICD-10-CM | POA: Diagnosis not present

## 2019-10-06 MED ORDER — RANITIDINE HCL 150 MG PO CAPS: 150.0000 mg | ORAL_CAPSULE | Freq: Two times a day (BID) | ORAL | 0 refills | Status: DC

## 2019-10-06 NOTE — Progress Notes (Signed)
[facility] [place], [place] [address]  Internal MEDICINE  Office Visit Note  Patient Name: Susan Shah  [id number]  [id number]  Date of Service: 10/08/2019  Chief Complaint  Patient presents with  . Follow-up    UTI    HPI Patient is here for routine follow-up She has recently been seen by her Neurologist and they are going to be tapering down her medications to see if this helps with her worsening tremors and weakness She has recently been diagnosed with teeth grinding which her dentist feels has been causing her TMJ symptoms, dentist is going to be getting her a mouth guard as they have determined she clenches her jaw and grinds her teeth at night She was last seen for a UTI, resolved no further symptoms at this time  Already has lab slip to have labs drawn for her CPE in December  Current Medication: Outpatient Encounter Medications as of 10/06/2019  Medication Sig  . acetaminophen (TYLENOL) 500 MG tablet Take 1,000 mg by mouth every 6 (six) hours as needed for moderate pain or headache.  Marland Kitchen amLODipine (NORVASC) 2.5 MG tablet Take 1 tablet (2.5 mg total) by mouth daily.  Marland Kitchen buPROPion (WELLBUTRIN XL) 150 MG 24 hr tablet Take 1 tablet (150 mg total) by mouth every morning.  . Carboxymethylcellul-Glycerin (LUBRICATING EYE DROPS OP) Place 1 drop into both eyes daily as needed (dry eyes).  . cholecalciferol (VITAMIN D3) 25 MCG (1000 UNIT) tablet Take 1,000 Units by mouth daily.  Marland Kitchen escitalopram (LEXAPRO) 10 MG tablet TAKE ONE TABLET BY MOUTH EVERY DAY AT 5-7 PM  . fluticasone (FLONASE) 50 MCG/ACT nasal spray Place 1 spray into both nostrils at bedtime as needed for allergies or rhinitis.  Marland Kitchen HYDROcodone-acetaminophen (NORCO) 5-325 MG tablet Take 1 tablet by mouth every 4 (four) hours as needed for moderate pain.  Marland Kitchen loratadine (QC LORATADINE ALLERGY RELIEF) 10 MG tablet Take 1 tablet (10 mg total) by mouth daily.  Marland Kitchen losartan-hydrochlorothiazide (HYZAAR) 100-25  MG tablet Take 1 tablet by mouth daily.  . metoprolol tartrate (LOPRESSOR) 25 MG tablet Take 1 tablet (25 mg total) by mouth 2 (two) times daily with a meal.  . NON FORMULARY cpap device  . ondansetron (ZOFRAN-ODT) 4 MG disintegrating tablet Take 1 tablet (4 mg total) by mouth every 8 (eight) hours as needed for nausea or vomiting.  Marland Kitchen oxybutynin (DITROPAN-XL) 10 MG 24 hr tablet Take 2 tablets (20 mg total) by mouth daily.  . pantoprazole (PROTONIX) 40 MG tablet Take 1 tablet (40 mg total) by mouth 2 (two) times daily.  . potassium chloride SA (KLOR-CON) 20 MEQ tablet Take 1 tablet (20 mEq total) by mouth daily.  Marland Kitchen rOPINIRole (REQUIP) 0.5 MG tablet TAKE 1 TABLET BY MOUTH TWICE A DAY AS NEEDED  . simvastatin (ZOCOR) 20 MG tablet TAKE 1 TABLET BY MOUTH DAILY AT 6 PM.  . tiZANidine (ZANAFLEX) 2 MG tablet Take 1 tablet (2 mg total) by mouth 2 (two) times daily as needed for muscle spasms.  . vitamin B-12 (CYANOCOBALAMIN) 1000 MCG tablet Take 1,000 mcg by mouth daily.  . [DISCONTINUED] sulfamethoxazole-trimethoprim (BACTRIM) 400-80 MG tablet Take 1 tablet by mouth 2 (two) times daily.  . ranitidine (ZANTAC) 150 MG capsule Take 1 capsule (150 mg total) by mouth 2 (two) times daily.   No facility-administered encounter medications on file as of 10/06/2019.    Surgical History: Past Surgical History:  Procedure Laterality Date  . [place]  04/24/2016   Procedure: 24 HOUR PH STUDY;  Surgeon: Lucilla Lame, MD;  Location: [facility] ENDOSCOPY;  Service: Endoscopy;  Laterality: N/A;  . ABDOMINAL HYSTERECTOMY     Total  . APPENDECTOMY    . CARPOMETACARPAL ([place]) FUSION OF THUMB Left 05/16/2016   Procedure: CARPOMETACARPAL [facility]) FUSION OF THUMB;  Surgeon: Hessie Knows, MD;  Location: [facility] ORS;  Service: Orthopedics;  Laterality: Left;  . CARPOMETACARPAL ([place]) FUSION OF THUMB Left 03/30/2019   Procedure: LEFT THUMB SUSPENSION PLASTY;  Surgeon: Hessie Knows, MD;  Location: [facility] ORS;  Service: Orthopedics;   Laterality: Left;  . CATARACT EXTRACTION Bilateral 12/2012  . CHOLECYSTECTOMY    . COLONOSCOPY  2003  . ESOPHAGEAL MANOMETRY N/A 04/24/2016   Procedure: ESOPHAGEAL MANOMETRY (EM);  Surgeon: Lucilla Lame, MD;  Location: [facility] ENDOSCOPY;  Service: Endoscopy;  Laterality: N/A;  . ESOPHAGOGASTRODUODENOSCOPY  08/2011  . FOOT SURGERY    . HALLUX VALGUS CORRECTION    . HIP SURGERY Right 1950   tendon release r/t polio  . KNEE ARTHROSCOPY Left 06/23/2008  . RETINAL DETACHMENT SURGERY Right 03/2014  . ROTATOR CUFF REPAIR Bilateral   . URINARY SURGERY  2014   [place]    Medical History: Past Medical History:  Diagnosis Date  . Anxiety   . Asthma 2015   mild, seasonal allergy triggered.  . Cancer ([place]) 2013   skin cancer  on left hand  . Depression   . Esophageal dysmotility   . GERD (gastroesophageal reflux disease) 11/05/2012  . History of hiatal hernia   . Hyperlipidemia   . Hypertension   . Incontinence of urine   . Lung nodule   . OA (osteoarthritis)   . Obesity   . PONV (postoperative nausea and vomiting)   . Post-polio syndrome    contracted at 1 months old  . Pulmonary nodule 11/05/2012   RML  . Shingles 2009  . Sleep apnea     Family History: Family History  Problem Relation Age of Onset  . Heart disease Mother   . Heart disease Father   . Heart disease Brother   . Stroke Maternal Grandmother   . Heart disease Maternal Grandfather   . Heart attack Brother     Social History   Socioeconomic History  . Marital status: Married    Spouse name: Not on file  . Number of children: Not on file  . Years of education: Not on file  . Highest education level: Not on file  Occupational History  . Not on file  Tobacco Use  . Smoking status: Former Smoker    Quit date: 05/09/1968    Years since quitting: 51.4  . Smokeless tobacco: Never Used  Vaping Use  . Vaping Use: Never used  Substance and Sexual Activity  . Alcohol use: No  . Drug use: No  . Sexual  activity: Not on file  Other Topics Concern  . Not on file  Social History Narrative  . Not on file   Social Determinants of Health   Financial Resource Strain:   . Difficulty of Paying Living Expenses: Not on file  Food Insecurity:   . Worried About Charity fundraiser in the Last Year: Not on file  . Ran Out of Food in the Last Year: Not on file  Transportation Needs:   . Lack of Transportation (Medical): Not on file  . Lack of Transportation (Non-Medical): Not on file  Physical Activity:   . Days of Exercise per Week: Not on  file  . Minutes of Exercise per Session: Not on file  Stress:   . Feeling of Stress : Not on file  Social Connections:   . Frequency of Communication with Friends and Family: Not on file  . Frequency of Social Gatherings with Friends and Family: Not on file  . Attends Religious Services: Not on file  . Active Member of Clubs or Organizations: Not on file  . Attends Archivist Meetings: Not on file  . Marital Status: Not on file  Intimate Partner Violence:   . Fear of Current or Ex-Partner: Not on file  . Emotionally Abused: Not on file  . Physically Abused: Not on file  . Sexually Abused: Not on file   Review of Systems  Constitutional: Negative for chills, diaphoresis and fatigue.  HENT: Negative for ear pain, postnasal drip and sinus pressure.   Eyes: Negative for photophobia, discharge, redness, itching and visual disturbance.  Respiratory: Negative for cough, shortness of breath and wheezing.   Cardiovascular: Negative for chest pain, palpitations and leg swelling.  Gastrointestinal: Negative for abdominal pain, constipation, diarrhea, nausea and vomiting.  Genitourinary: Negative for dysuria and flank pain.  Musculoskeletal: Positive for gait problem. Negative for arthralgias, back pain and neck pain.       History of childhood polio, right leg affected  Skin: Negative for color change.  Allergic/Immunologic: Negative for  environmental allergies and food allergies.  Neurological: Positive for tremors and weakness. Negative for dizziness and headaches.  Hematological: Does not bruise/bleed easily.  Psychiatric/Behavioral: Negative for agitation, behavioral problems (depression) and hallucinations.   Vital Signs: BP 132/68   Pulse 71   Temp 97.6 F (36.4 C)   Resp 16   Ht 5\' 3"  (1.6 m)   Wt 171 lb (77.6 kg)   SpO2 98%   BMI 30.29 kg/m    Physical Exam Vitals reviewed.  Constitutional:      Appearance: Normal appearance.  HENT:     Nose: Nose normal.     Mouth/Throat:     Mouth: Mucous membranes are moist.     Pharynx: Oropharynx is clear.  Cardiovascular:     Rate and Rhythm: Normal rate and regular rhythm.     Pulses: Normal pulses.     Heart sounds: Normal heart sounds.  Pulmonary:     Effort: Pulmonary effort is normal.     Breath sounds: Normal breath sounds.  Abdominal:     General: Abdomen is flat.     Palpations: Abdomen is soft.  Musculoskeletal:        General: Normal range of motion.     Cervical back: Normal range of motion.     Comments: Childhood polio, brace to right leg, walks with walker, appears to be at her baseline  Skin:    General: Skin is warm.  Neurological:     General: No focal deficit present.     Mental Status: She is alert and oriented to person, place, and time. Mental status is at baseline.  Psychiatric:        Mood and Affect: Mood normal.        Behavior: Behavior normal.        Thought Content: Thought content normal.    Assessment/Plan: 1. TMJ (temporomandibular joint disorder) Dentist to make her a mouth guard as her symptoms are from her clenching her jaw and grinding her teeth at night.  2. Benign essential tremor Followed by neurology and plans to wean and taper down some of  her medications in hopes to improve her tremors.  3. Essential hypertension BP and HR well controlled today, continue with current therapy and routine  monitoring.  4. Urinary tract infection with hematuria, site unspecified Resolved  5. Gastroesophageal reflux disease without esophagitis Symptoms remain stable, requesting refills today. - ranitidine (ZANTAC) 150 MG capsule; Take 1 capsule (150 mg total) by mouth 2 (two) times daily.  Dispense: 30 capsule; Refill: 0  General Counseling: Zoeie verbalizes understanding of the findings of todays visit and agrees with plan of treatment. I have discussed any further diagnostic evaluation that may be needed or ordered today. We also reviewed her medications today. she has been encouraged to call the office with any questions or concerns that should arise related to todays visit.  Meds ordered this encounter  Medications  . ranitidine (ZANTAC) 150 MG capsule    Sig: Take 1 capsule (150 mg total) by mouth 2 (two) times daily.    Dispense:  30 capsule    Refill:  0    Time spent: 30Minutes Time spent includes review of chart, medications, test results and follow-up plan with the patient.  This patient was seen by Theodoro Grist AGNP-C in Collaboration with Dr Lavera Guise as a part of collaborative care agreement     Tanna Furry. Deloyce Walthers AGNP-C Internal medicine

## 2019-10-08 ENCOUNTER — Encounter: Payer: Self-pay | Admitting: Hospice and Palliative Medicine

## 2019-10-22 DIAGNOSIS — Z23 Encounter for immunization: Secondary | ICD-10-CM | POA: Diagnosis not present

## 2019-10-26 ENCOUNTER — Ambulatory Visit: Payer: Medicare Other | Admitting: Internal Medicine

## 2019-10-28 ENCOUNTER — Encounter: Payer: Self-pay | Admitting: Internal Medicine

## 2019-10-28 ENCOUNTER — Ambulatory Visit (INDEPENDENT_AMBULATORY_CARE_PROVIDER_SITE_OTHER): Payer: Medicare Other | Admitting: Internal Medicine

## 2019-10-28 ENCOUNTER — Telehealth: Payer: Self-pay

## 2019-10-28 DIAGNOSIS — R4189 Other symptoms and signs involving cognitive functions and awareness: Secondary | ICD-10-CM | POA: Diagnosis not present

## 2019-10-28 DIAGNOSIS — Z7189 Other specified counseling: Secondary | ICD-10-CM | POA: Diagnosis not present

## 2019-10-28 DIAGNOSIS — G4733 Obstructive sleep apnea (adult) (pediatric): Secondary | ICD-10-CM

## 2019-10-28 DIAGNOSIS — R42 Dizziness and giddiness: Secondary | ICD-10-CM | POA: Diagnosis not present

## 2019-10-28 DIAGNOSIS — Z9989 Dependence on other enabling machines and devices: Secondary | ICD-10-CM

## 2019-10-28 DIAGNOSIS — R251 Tremor, unspecified: Secondary | ICD-10-CM | POA: Diagnosis not present

## 2019-10-28 DIAGNOSIS — G8311 Monoplegia of lower limb affecting right dominant side: Secondary | ICD-10-CM | POA: Diagnosis not present

## 2019-10-28 DIAGNOSIS — R519 Headache, unspecified: Secondary | ICD-10-CM | POA: Diagnosis not present

## 2019-10-28 NOTE — Telephone Encounter (Signed)
Called pt to advise of follow ups per [facility] request

## 2019-10-28 NOTE — Progress Notes (Signed)
[facility] [place], [place] [address]  Pulmonary Sleep Medicine   Office Visit Note  Patient Name: Susan Shah DOB: 06-Dec-1945 MRN [id number]  Date of Service: 10/28/2019  Complaints/HPI: Patient is here for routine pulmonary follow-up She has not been wearing her CPAP for the past few months due to having recent issues with teeth grinding and TMJ pain Once the pain started she was unable to tolerate her CPAP mask due to the straps causing further pain She has now been evaluated by her dentist and has been given a mouth guard for her to wear at night to prevent jaw clenching and teeth grinding She has tried to wear her CPAP with mouth guard in place but is having difficulty Discussed the many different styles of masks such as nasal pillows which may be a better option for her now She has not noticed any significant differences in her daytime sleepiness since not wearing CPAP   ROS  General: (-) fever, (-) chills, (-) night sweats, (-) weakness Skin: (-) rashes, (-) itching,. Eyes: (-) visual changes, (-) redness, (-) itching. Nose and Sinuses: (-) nasal stuffiness or itchiness, (-) postnasal drip, (-) nosebleeds, (-) sinus trouble. Mouth and Throat: (-) sore throat, (-) hoarseness. Neck: (-) swollen glands, (-) enlarged thyroid, (-) neck pain. Respiratory: - cough, (-) bloody sputum, - shortness of breath, - wheezing. Cardiovascular: - ankle swelling, (-) chest pain. Lymphatic: (-) lymph node enlargement. Neurologic: (-) numbness, (-) tingling. Psychiatric: (-) anxiety, (-) depression   Current Medication: Outpatient Encounter Medications as of 10/28/2019  Medication Sig  . acetaminophen (TYLENOL) 500 MG tablet Take 1,000 mg by mouth every 6 (six) hours as needed for moderate pain or headache.  Marland Kitchen amLODipine (NORVASC) 2.5 MG tablet Take 1 tablet (2.5 mg total) by mouth daily.  Marland Kitchen buPROPion (WELLBUTRIN XL) 150 MG 24 hr tablet Take 1 tablet (150 mg  total) by mouth every morning.  . Carboxymethylcellul-Glycerin (LUBRICATING EYE DROPS OP) Place 1 drop into both eyes daily as needed (dry eyes).  . cholecalciferol (VITAMIN D3) 25 MCG (1000 UNIT) tablet Take 1,000 Units by mouth daily.  Marland Kitchen escitalopram (LEXAPRO) 10 MG tablet TAKE ONE TABLET BY MOUTH EVERY DAY AT 5-7 PM  . fluticasone (FLONASE) 50 MCG/ACT nasal spray Place 1 spray into both nostrils at bedtime as needed for allergies or rhinitis.  Marland Kitchen HYDROcodone-acetaminophen (NORCO) 5-325 MG tablet Take 1 tablet by mouth every 4 (four) hours as needed for moderate pain.  Marland Kitchen loratadine (QC LORATADINE ALLERGY RELIEF) 10 MG tablet Take 1 tablet (10 mg total) by mouth daily.  Marland Kitchen losartan-hydrochlorothiazide (HYZAAR) 100-25 MG tablet Take 1 tablet by mouth daily.  . metoprolol tartrate (LOPRESSOR) 25 MG tablet Take 1 tablet (25 mg total) by mouth 2 (two) times daily with a meal.  . NON FORMULARY cpap device  . ondansetron (ZOFRAN-ODT) 4 MG disintegrating tablet Take 1 tablet (4 mg total) by mouth every 8 (eight) hours as needed for nausea or vomiting.  Marland Kitchen oxybutynin (DITROPAN-XL) 10 MG 24 hr tablet Take 2 tablets (20 mg total) by mouth daily.  . pantoprazole (PROTONIX) 40 MG tablet Take 1 tablet (40 mg total) by mouth 2 (two) times daily.  . potassium chloride SA (KLOR-CON) 20 MEQ tablet Take 1 tablet (20 mEq total) by mouth daily.  . ranitidine (ZANTAC) 150 MG capsule Take 1 capsule (150 mg total) by mouth 2 (two) times daily.  Marland Kitchen rOPINIRole (REQUIP) 0.5 MG tablet TAKE 1 TABLET BY MOUTH TWICE  A DAY AS NEEDED  . simvastatin (ZOCOR) 20 MG tablet TAKE 1 TABLET BY MOUTH DAILY AT 6 PM.  . tiZANidine (ZANAFLEX) 2 MG tablet Take 1 tablet (2 mg total) by mouth 2 (two) times daily as needed for muscle spasms.  . vitamin B-12 (CYANOCOBALAMIN) 1000 MCG tablet Take 1,000 mcg by mouth daily.   No facility-administered encounter medications on file as of 10/28/2019.    Surgical History: Past Surgical History:   Procedure Laterality Date  . [place] STUDY N/A 04/24/2016   Procedure: 41 HOUR PH STUDY;  Surgeon: Lucilla Lame, MD;  Location: [facility] ENDOSCOPY;  Service: Endoscopy;  Laterality: N/A;  . ABDOMINAL HYSTERECTOMY     Total  . APPENDECTOMY    . CARPOMETACARPAL ([place]) FUSION OF THUMB Left 05/16/2016   Procedure: CARPOMETACARPAL [facility]) FUSION OF THUMB;  Surgeon: Hessie Knows, MD;  Location: [facility] ORS;  Service: Orthopedics;  Laterality: Left;  . CARPOMETACARPAL ([place]) FUSION OF THUMB Left 03/30/2019   Procedure: LEFT THUMB SUSPENSION PLASTY;  Surgeon: Hessie Knows, MD;  Location: [facility] ORS;  Service: Orthopedics;  Laterality: Left;  . CATARACT EXTRACTION Bilateral 12/2012  . CHOLECYSTECTOMY    . COLONOSCOPY  2003  . ESOPHAGEAL MANOMETRY N/A 04/24/2016   Procedure: ESOPHAGEAL MANOMETRY (EM);  Surgeon: Lucilla Lame, MD;  Location: [facility] ENDOSCOPY;  Service: Endoscopy;  Laterality: N/A;  . ESOPHAGOGASTRODUODENOSCOPY  08/2011  . FOOT SURGERY    . HALLUX VALGUS CORRECTION    . HIP SURGERY Right 1950   tendon release r/t polio  . KNEE ARTHROSCOPY Left 06/23/2008  . RETINAL DETACHMENT SURGERY Right 03/2014  . ROTATOR CUFF REPAIR Bilateral   . URINARY SURGERY  2014   [place]    Medical History: Past Medical History:  Diagnosis Date  . Anxiety   . Asthma 2015   mild, seasonal allergy triggered.  . Cancer ([place]) 2013   skin cancer  on left hand  . Depression   . Esophageal dysmotility   . GERD (gastroesophageal reflux disease) 11/05/2012  . History of hiatal hernia   . Hyperlipidemia   . Hypertension   . Incontinence of urine   . Lung nodule   . OA (osteoarthritis)   . Obesity   . PONV (postoperative nausea and vomiting)   . Post-polio syndrome    contracted at 39 months old  . Pulmonary nodule 11/05/2012   RML  . Shingles 2009  . Sleep apnea     Family History: Family History  Problem Relation Age of Onset  . Heart disease Mother   . Heart disease Father   . Heart disease Brother    . Stroke Maternal Grandmother   . Heart disease Maternal Grandfather   . Heart attack Brother     Social History: Social History   Socioeconomic History  . Marital status: Married    Spouse name: Not on file  . Number of children: Not on file  . Years of education: Not on file  . Highest education level: Not on file  Occupational History  . Not on file  Tobacco Use  . Smoking status: Former Smoker    Quit date: 05/09/1968    Years since quitting: 51.5  . Smokeless tobacco: Never Used  Vaping Use  . Vaping Use: Never used  Substance and Sexual Activity  . Alcohol use: No  . Drug use: No  . Sexual activity: Not on file  Other Topics Concern  . Not on file  Social History Narrative  . Not on  file   Social Determinants of Health   Financial Resource Strain:   . Difficulty of Paying Living Expenses: Not on file  Food Insecurity:   . Worried About Charity fundraiser in the Last Year: Not on file  . Ran Out of Food in the Last Year: Not on file  Transportation Needs:   . Lack of Transportation (Medical): Not on file  . Lack of Transportation (Non-Medical): Not on file  Physical Activity:   . Days of Exercise per Week: Not on file  . Minutes of Exercise per Session: Not on file  Stress:   . Feeling of Stress : Not on file  Social Connections:   . Frequency of Communication with Friends and Family: Not on file  . Frequency of Social Gatherings with Friends and Family: Not on file  . Attends Religious Services: Not on file  . Active Member of Clubs or Organizations: Not on file  . Attends Archivist Meetings: Not on file  . Marital Status: Not on file  Intimate Partner Violence:   . Fear of Current or Ex-Partner: Not on file  . Emotionally Abused: Not on file  . Physically Abused: Not on file  . Sexually Abused: Not on file    Vital Signs: Blood pressure (!) 142/76, pulse 83, temperature 98.2 F (36.8 C), resp. rate 16, height 5\' 3"  (1.6 m), weight 170  lb 12.8 oz (77.5 kg), SpO2 96 %.  Examination: General Appearance: The patient is well-developed, well-nourished, and in no distress. Skin: Gross inspection of skin unremarkable. Head: normocephalic, no gross deformities. Eyes: no gross deformities noted. ENT: ears appear grossly normal no exudates. Neck: Supple. No thyromegaly. No LAD. Respiratory: Clear throughout, no rhonchi or wheezing noted. Cardiovascular: Normal S1 and S2 without murmur or rub. Extremities: No cyanosis. pulses are equal. Neurologic: Alert and oriented. No involuntary movements.  LABS: Recent Results (from the past 2160 hour(s))  POCT Urinalysis Dipstick     Status: Abnormal   Collection Time: 09/20/19  2:53 PM  Result Value Ref Range   Color, UA     Clarity, UA     Glucose, UA Negative Negative   Bilirubin, UA Negative    Ketones, UA Trace    Spec Grav, UA 1.010 1.010 - 1.025   Blood, UA Large    pH, UA 7.0 5.0 - 8.0   Protein, UA Positive (A) Negative   Urobilinogen, UA negative (A) 0.2 or 1.0 E.U./dL   Nitrite, UA Positive    Leukocytes, UA Large (3+) (A) Negative   Appearance     Odor    CULTURE, URINE COMPREHENSIVE     Status: Abnormal   Collection Time: 09/20/19  2:55 PM   Specimen: Urine   Urine  Result Value Ref Range   Urine Culture, Comprehensive Final report (A)    Organism ID, Bacteria Proteus mirabilis (A)     Comment: 25,000-50,000 colony forming units per mL Cefazolin <=4 ug/mL Cefazolin with an MIC <=16 predicts susceptibility to the oral agents cefaclor, cefdinir, cefpodoxime, cefprozil, cefuroxime, cephalexin, and loracarbef when used for therapy of uncomplicated urinary tract infections due to E. coli, Klebsiella pneumoniae, and Proteus mirabilis.    Organism ID, Bacteria Comment (A)     Comment: Escherichia coli, identified by an automated biochemical system. Greater than 100,000 colony forming units per mL Cefazolin <=4 ug/mL Cefazolin with an MIC <=16 predicts  susceptibility to the oral agents cefaclor, cefdinir, cefpodoxime, cefprozil, cefuroxime, cephalexin, and loracarbef when used for  therapy of uncomplicated urinary tract infections due to E. coli, Klebsiella pneumoniae, and Proteus mirabilis.    ANTIMICROBIAL SUSCEPTIBILITY Comment     Comment:       ** S = Susceptible; I = Intermediate; R = Resistant **                    P = Positive; N = Negative             MICS are expressed in micrograms per mL    Antibiotic                 RSLT#1    RSLT#2    RSLT#3    RSLT#4 Amoxicillin/Clavulanic Acid              S Ampicillin                     R         S Cefepime                       S         S Ceftriaxone                    S         S Cefuroxime                     S         S Ciprofloxacin                  S         S Ertapenem                      S         S Gentamicin                     S         S Imipenem                                 S Levofloxacin                   S         S Meropenem                      S         S Nitrofurantoin                 R         S Piperacillin/Tazobactam        S         S Tetracycline                   R         S Tobramycin                     S         S Trimethoprim/Sulfa             S         S     Radiology: CT Head Wo Contrast  Result Date: 06/01/2019 CLINICAL DATA:  Fall, head trauma EXAM: CT HEAD WITHOUT CONTRAST TECHNIQUE:  Contiguous axial images were obtained from the base of the skull through the vertex without intravenous contrast. COMPARISON:  07/22/2017 FINDINGS: Brain: No acute intracranial abnormality. Specifically, no hemorrhage, hydrocephalus, mass lesion, acute infarction, or significant intracranial injury. Vascular: No hyperdense vessel or unexpected calcification. Skull: No acute calvarial abnormality. Sinuses/Orbits: Visualized paranasal sinuses and mastoids clear. Orbital soft tissues unremarkable. Other: None IMPRESSION: No acute intracranial abnormality.  Electronically Signed   By: Rolm Baptise M.D.   On: 06/01/2019 18:34   CT Cervical Spine Wo Contrast  Result Date: 06/01/2019 CLINICAL DATA:  Fall, head trauma EXAM: CT CERVICAL SPINE WITHOUT CONTRAST TECHNIQUE: Multidetector CT imaging of the cervical spine was performed without intravenous contrast. Multiplanar CT image reconstructions were also generated. COMPARISON:  None. FINDINGS: Alignment: Slight anterolisthesis of C5 on C6 related to facet disease. Skull base and vertebrae: No acute fracture. No primary bone lesion or focal pathologic process. Soft tissues and spinal canal: No prevertebral fluid or swelling. No visible canal hematoma. Disc levels: Diffuse advanced degenerative facet disease bilaterally. Disc space narrowing and spurring diffusely. Upper chest: No acute findings Other: None IMPRESSION: Cervical spondylosis.  No acute bony abnormality. Electronically Signed   By: Rolm Baptise M.D.   On: 06/01/2019 18:40    Assessment and Plan: Patient Active Problem List   Diagnosis Date Noted  . Urinary tract infection with hematuria 10/03/2019  . Non-intractable vomiting 10/03/2019  . Dysuria 10/03/2019  . TMJ (temporomandibular joint disorder) 07/21/2019  . Weakness 07/21/2019  . OSA on CPAP 05/02/2019  . Cough due to ACE inhibitor 07/10/2018  . Other fatigue 02/16/2018  . Vertigo 02/08/2018  . Screening for breast cancer 12/17/2017  . Flu vaccine need 10/30/2017  . Need for vaccination against Streptococcus pneumoniae using pneumococcal conjugate vaccine 13 10/30/2017  . Hypokalemia 08/28/2017  . Absolute anemia 08/28/2017  . Benign essential tremor 08/28/2017  . Chest pain 08/25/2017  . Depression 03/23/2017  . Hiatal hernia 04/11/2016  . Anxiety   . OA (osteoarthritis)   . Hyperlipidemia   . Essential hypertension   . Post-polio syndrome   . Lung nodule   . Esophageal dysmotility   . Incontinence of urine   . Obesity   . GERD (gastroesophageal reflux disease)  11/05/2012  . Pulmonary nodule 11/05/2012  . Shingles 01/22/2007    1. OSA on CPAP Will have her set up to meet with Claiborne Billings to discuss mask options, may need repeat titration study to determine OSA at this time due to now wearing a mouth guard  2. CPAP use counseling Discussed importance of adequate CPAP use as well as proper care and cleaning techniques of machine and all supplies.  General Counseling: I have discussed the findings of the evaluation and examination with [facility].  I have also discussed any further diagnostic evaluation thatmay be needed or ordered today. Jesusita verbalizes understanding of the findings of todays visit. We also reviewed her medications today and discussed drug interactions and side effects including but not limited excessive drowsiness and altered mental states. We also discussed that there is always a risk not just to her but also people around her. she has been encouraged to call the office with any questions or concerns that should arise related to todays visit.   Time spent: 25  I have personally obtained a history, examined the patient, evaluated laboratory and imaging results, formulated the assessment and plan and placed orders. This patient was seen by Casey Burkitt AGNP-C in Collaboration with Dr. Devona Konig as  a part of collaborative care agreement.    [name], MD [facility] Pulmonary and Critical Care Sleep medicine

## 2019-11-01 ENCOUNTER — Telehealth: Payer: Self-pay

## 2019-11-01 ENCOUNTER — Encounter: Payer: Self-pay | Admitting: Internal Medicine

## 2019-11-01 NOTE — Telephone Encounter (Signed)
Confirmed and screened for 11-03-19 ov.

## 2019-11-01 NOTE — Patient Instructions (Signed)

## 2019-11-03 ENCOUNTER — Other Ambulatory Visit: Payer: Self-pay | Admitting: Hospice and Palliative Medicine

## 2019-11-03 ENCOUNTER — Other Ambulatory Visit: Payer: Self-pay

## 2019-11-03 ENCOUNTER — Ambulatory Visit (INDEPENDENT_AMBULATORY_CARE_PROVIDER_SITE_OTHER): Payer: Medicare Other

## 2019-11-03 DIAGNOSIS — G4733 Obstructive sleep apnea (adult) (pediatric): Secondary | ICD-10-CM

## 2019-11-03 NOTE — Progress Notes (Signed)
95 percentile pressure 10   95th percentile leak 13.1   apnea index 2.9 /hr  apnea-hypopnea index  5.2 /hr   total days used  >4 hr 9 days  total days used <4 hr 81 days  Total compliance 10 percent  She hasn't used since 09-11-2019. States mask is hurting her. She has a dreamstation which is under recall, and hers is over 73 years old will see about getting her new cpap and supplies. I did stress to her that she had to use the new 4 and 1/2 hour every day or insurance would not cover.

## 2019-11-08 ENCOUNTER — Emergency Department: Payer: Medicare Other

## 2019-11-08 ENCOUNTER — Encounter: Payer: Self-pay | Admitting: Emergency Medicine

## 2019-11-08 ENCOUNTER — Other Ambulatory Visit: Payer: Self-pay

## 2019-11-08 ENCOUNTER — Emergency Department
Admission: EM | Admit: 2019-11-08 | Discharge: 2019-11-08 | Disposition: A | Discharge status: Home / Self Care | Payer: Medicare Other | Attending: Emergency Medicine | Admitting: Emergency Medicine

## 2019-11-08 DIAGNOSIS — S0083XA Contusion of other part of head, initial encounter: Secondary | ICD-10-CM | POA: Diagnosis not present

## 2019-11-08 DIAGNOSIS — W010XXA Fall on same level from slipping, tripping and stumbling without subsequent striking against object, initial encounter: Secondary | ICD-10-CM | POA: Insufficient documentation

## 2019-11-08 DIAGNOSIS — R531 Weakness: Secondary | ICD-10-CM | POA: Diagnosis not present

## 2019-11-08 DIAGNOSIS — Z85828 Personal history of other malignant neoplasm of skin: Secondary | ICD-10-CM | POA: Diagnosis not present

## 2019-11-08 DIAGNOSIS — W19XXXA Unspecified fall, initial encounter: Secondary | ICD-10-CM

## 2019-11-08 DIAGNOSIS — J45909 Unspecified asthma, uncomplicated: Secondary | ICD-10-CM | POA: Insufficient documentation

## 2019-11-08 DIAGNOSIS — S0990XA Unspecified injury of head, initial encounter: Secondary | ICD-10-CM

## 2019-11-08 DIAGNOSIS — S01111A Laceration without foreign body of right eyelid and periocular area, initial encounter: Secondary | ICD-10-CM | POA: Insufficient documentation

## 2019-11-08 DIAGNOSIS — R5381 Other malaise: Secondary | ICD-10-CM | POA: Diagnosis not present

## 2019-11-08 DIAGNOSIS — R220 Localized swelling, mass and lump, head: Secondary | ICD-10-CM | POA: Diagnosis not present

## 2019-11-08 DIAGNOSIS — Z87891 Personal history of nicotine dependence: Secondary | ICD-10-CM | POA: Insufficient documentation

## 2019-11-08 DIAGNOSIS — Z79899 Other long term (current) drug therapy: Secondary | ICD-10-CM | POA: Diagnosis not present

## 2019-11-08 DIAGNOSIS — S0181XA Laceration without foreign body of other part of head, initial encounter: Secondary | ICD-10-CM

## 2019-11-08 DIAGNOSIS — R519 Headache, unspecified: Secondary | ICD-10-CM | POA: Diagnosis not present

## 2019-11-08 DIAGNOSIS — M546 Pain in thoracic spine: Secondary | ICD-10-CM | POA: Diagnosis not present

## 2019-11-08 DIAGNOSIS — S01112A Laceration without foreign body of left eyelid and periocular area, initial encounter: Secondary | ICD-10-CM | POA: Diagnosis not present

## 2019-11-08 DIAGNOSIS — S0993XA Unspecified injury of face, initial encounter: Secondary | ICD-10-CM | POA: Diagnosis not present

## 2019-11-08 DIAGNOSIS — I10 Essential (primary) hypertension: Secondary | ICD-10-CM | POA: Insufficient documentation

## 2019-11-08 NOTE — ED Provider Notes (Signed)
[facility] EMERGENCY DEPARTMENT Provider Note   CSN: [id number] Arrival date & time: 11/08/19  1432     History Chief Complaint  Patient presents with   Lytle Michaels    Susan Shah is a 74 y.o. female presents to the emergency department for evaluation of right-sided facial pain and headache as well as thoracic back pain after a fall.  Around 3 PM today patient was walking when she tripped and fell hitting her head on the floor.  No LOC, nausea or vomiting.  Denies any preceding dizziness, chest pain or shortness of breath.  Patient has a mild headache with large hematoma over the right eyebrow.  She has no neck pain numbness tingling radicular symptoms.  She does have some aching pain along the midline of her thoracic spine that is worse with standing but alleviated with sitting.  She has no lower back pain numbness tingling radicular symptoms.  No groin pain, thigh pain or lower extremity discomfort.  She was able to walk after the accident.  She denies being on blood thinners.  HPI     Past Medical History:  Diagnosis Date   Anxiety    Asthma 2015   mild, seasonal allergy triggered.   Cancer ([place]) 2013   skin cancer  on left hand   Depression    Esophageal dysmotility    GERD (gastroesophageal reflux disease) 11/05/2012   History of hiatal hernia    Hyperlipidemia    Hypertension    Incontinence of urine    Lung nodule    OA (osteoarthritis)    Obesity    PONV (postoperative nausea and vomiting)    Post-polio syndrome    contracted at 40 months old   Pulmonary nodule 11/05/2012   RML   Shingles 2009   Sleep apnea     Patient Active Problem List   Diagnosis Date Noted   Urinary tract infection with hematuria 10/03/2019   Non-intractable vomiting 10/03/2019   Dysuria 10/03/2019   TMJ (temporomandibular joint disorder) 07/21/2019   Weakness 07/21/2019   OSA on CPAP 05/02/2019   Cough due to ACE inhibitor 07/10/2018    Other fatigue 02/16/2018   Vertigo 02/08/2018   Screening for breast cancer 12/17/2017   Flu vaccine need 10/30/2017   Need for vaccination against Streptococcus pneumoniae using pneumococcal conjugate vaccine 13 10/30/2017   Hypokalemia 08/28/2017   Absolute anemia 08/28/2017   Benign essential tremor 08/28/2017   Chest pain 08/25/2017   Depression 03/23/2017   Hiatal hernia 04/11/2016   Anxiety    OA (osteoarthritis)    Hyperlipidemia    Essential hypertension    Post-polio syndrome    Lung nodule    Esophageal dysmotility    Incontinence of urine    Obesity    GERD (gastroesophageal reflux disease) 11/05/2012   Pulmonary nodule 11/05/2012   Shingles 01/22/2007    Past Surgical History:  Procedure Laterality Date   24 HOUR [place] STUDY N/A 04/24/2016   Procedure: 24 HOUR PH STUDY;  Surgeon: Lucilla Lame, MD;  Location: [facility] ENDOSCOPY;  Service: Endoscopy;  Laterality: N/A;   ABDOMINAL HYSTERECTOMY     Total   APPENDECTOMY     CARPOMETACARPAL ([facility]) FUSION OF THUMB Left 05/16/2016   Procedure: CARPOMETACARPAL [facility]) FUSION OF THUMB;  Surgeon: Hessie Knows, MD;  Location: [facility] ORS;  Service: Orthopedics;  Laterality: Left;   CARPOMETACARPAL ([place]) FUSION OF THUMB Left 03/30/2019   Procedure: LEFT THUMB SUSPENSION PLASTY;  Surgeon: Hessie Knows, MD;  Location:  [facility] ORS;  Service: Orthopedics;  Laterality: Left;   CATARACT EXTRACTION Bilateral 12/2012   CHOLECYSTECTOMY     COLONOSCOPY  2003   ESOPHAGEAL MANOMETRY N/A 04/24/2016   Procedure: ESOPHAGEAL MANOMETRY (EM);  Surgeon: Lucilla Lame, MD;  Location: [facility] ENDOSCOPY;  Service: Endoscopy;  Laterality: N/A;   ESOPHAGOGASTRODUODENOSCOPY  08/2011   FOOT SURGERY     HALLUX VALGUS CORRECTION     HIP SURGERY Right 1950   tendon release r/t polio   KNEE ARTHROSCOPY Left 06/23/2008   RETINAL DETACHMENT SURGERY Right 03/2014   ROTATOR CUFF REPAIR Bilateral    URINARY SURGERY  2014   [place]      OB History   No obstetric history on file.     Family History  Problem Relation Age of Onset   Heart disease Mother    Heart disease Father    Heart disease Brother    Stroke Maternal Grandmother    Heart disease Maternal Grandfather    Heart attack Brother     Social History   Tobacco Use   Smoking status: Former Smoker    Quit date: 05/09/1968    Years since quitting: 51.5   Smokeless tobacco: Never Used  Vaping Use   Vaping Use: Never used  Substance Use Topics   Alcohol use: No   Drug use: No    Home Medications Prior to Admission medications   Medication Sig Start Date End Date Taking? Authorizing Provider  acetaminophen (TYLENOL) 500 MG tablet Take 1,000 mg by mouth every 6 (six) hours as needed for moderate pain or headache.    [provider]  amLODipine (NORVASC) 2.5 MG tablet Take 1 tablet (2.5 mg total) by mouth daily. 01/06/19   Kendell Bane, NP  buPROPion (WELLBUTRIN XL) 150 MG 24 hr tablet Take 1 tablet (150 mg total) by mouth every morning. 09/07/19   Ronnell Freshwater, NP  Carboxymethylcellul-Glycerin (LUBRICATING EYE DROPS OP) Place 1 drop into both eyes daily as needed (dry eyes).    [provider]  cholecalciferol (VITAMIN D3) 25 MCG (1000 UNIT) tablet Take 1,000 Units by mouth daily.    [provider]  escitalopram (LEXAPRO) 10 MG tablet TAKE ONE TABLET BY MOUTH EVERY DAY AT 5-7 PM 07/05/19   Lavera Guise, MD  fluticasone [facility]) 50 MCG/ACT nasal spray Place 1 spray into both nostrils at bedtime as needed for allergies or rhinitis.    [provider]  HYDROcodone-acetaminophen (NORCO) 5-325 MG tablet Take 1 tablet by mouth every 4 (four) hours as needed for moderate pain. 09/20/19   Ronnell Freshwater, NP  loratadine (QC LORATADINE ALLERGY RELIEF) 10 MG tablet Take 1 tablet (10 mg total) by mouth daily. 01/08/19   Ronnell Freshwater, NP  losartan-hydrochlorothiazide (HYZAAR) 100-25 MG tablet Take 1  tablet by mouth daily. 07/13/19   Ronnell Freshwater, NP  metoprolol tartrate (LOPRESSOR) 25 MG tablet Take 1 tablet (25 mg total) by mouth 2 (two) times daily with a meal. 09/20/19   Boscia, Greer Ee, NP  NON FORMULARY cpap device    [provider]  ondansetron (ZOFRAN-ODT) 4 MG disintegrating tablet Take 1 tablet (4 mg total) by mouth every 8 (eight) hours as needed for nausea or vomiting. 09/20/19   Ronnell Freshwater, NP  oxybutynin (DITROPAN-XL) 10 MG 24 hr tablet Take 2 tablets (20 mg total) by mouth daily. 07/21/19   Ronnell Freshwater, NP  pantoprazole (PROTONIX) 40 MG tablet Take 1 tablet (40 mg total)  by mouth 2 (two) times daily. 06/29/19   Ronnell Freshwater, NP  potassium chloride SA (KLOR-CON) 20 MEQ tablet Take 1 tablet (20 mEq total) by mouth daily. 06/14/19   Ronnell Freshwater, NP  ranitidine (ZANTAC) 150 MG capsule Take 1 capsule (150 mg total) by mouth 2 (two) times daily. 10/06/19   Luiz Ochoa, NP  rOPINIRole (REQUIP) 0.5 MG tablet TAKE 1 TABLET BY MOUTH TWICE A DAY AS NEEDED 09/23/19   Lavera Guise, MD  simvastatin (ZOCOR) 20 MG tablet TAKE 1 TABLET BY MOUTH DAILY AT 6 PM. 09/21/19   [place]ar[place], Audie Clear, NP  tiZANidine (ZANAFLEX) 2 MG tablet Take 1 tablet (2 mg total) by mouth 2 (two) times daily as needed for muscle spasms. 09/03/19   Ronnell Freshwater, NP  vitamin B-12 (CYANOCOBALAMIN) 1000 MCG tablet Take 1,000 mcg by mouth daily.    [provider]    Allergies    Soma [carisoprodol], Iodinated diagnostic agents, and Iodine  Review of Systems   Review of Systems  Constitutional: Negative for activity change.  Eyes: Negative for pain and visual disturbance.  Respiratory: Negative for shortness of breath.   Cardiovascular: Negative for chest pain and leg swelling.  Gastrointestinal: Negative for abdominal pain.  Genitourinary: Negative for flank pain and pelvic pain.  Musculoskeletal: Positive for back pain. Negative for arthralgias, gait problem, joint  swelling, myalgias, neck pain and neck stiffness.  Skin: Positive for wound.  Neurological: Positive for headaches. Negative for dizziness, syncope, weakness, light-headedness and numbness.  Psychiatric/Behavioral: Negative for confusion and decreased concentration.    Physical Exam Updated Vital Signs BP (!) 109/91    Pulse 71    Temp 98.9 F (37.2 C) (Oral)    Resp 18    Ht 5\' 3"  (1.6 m)    Wt 77.1 kg    SpO2 99%    BMI 30.11 kg/m   Physical Exam Constitutional:      Appearance: She is well-developed.  HENT:     Head: Normocephalic and atraumatic.     Comments: Large hematoma over the right eyebrow with small superficial laceration.  No active bleeding.  Normal extraocular eye movement on the right side with no signs of entrapment.  Patient has tenderness along the superior and inferior orbital rim on the right side.    Right Ear: Tympanic membrane, ear canal and external ear normal.     Left Ear: Tympanic membrane, ear canal and external ear normal.     Nose: Nose normal.  Eyes:     Extraocular Movements: Extraocular movements intact.     Conjunctiva/sclera: Conjunctivae normal.     Pupils: Pupils are equal, round, and reactive to light.  Cardiovascular:     Rate and Rhythm: Normal rate.  Pulmonary:     Effort: Pulmonary effort is normal. No respiratory distress.     Breath sounds: Normal breath sounds.  Abdominal:     General: There is no distension.     Palpations: Abdomen is soft.     Tenderness: There is no abdominal tenderness.  Musculoskeletal:        General: No deformity. Normal range of motion.     Cervical back: Normal range of motion.     Comments: No tenderness palpation along the shoulders hips knees or ankles.  She is tender to palpation along the midline thoracic spine with percussion of the spinous process but no cervical or lumbar tenderness.  She has no abdominal or rib tenderness.  Skin:  General: Skin is warm and dry.     Findings: No rash.   Neurological:     Mental Status: She is alert and oriented to person, place, and time.     Cranial Nerves: No cranial nerve deficit.     Coordination: Coordination normal.  Psychiatric:        Behavior: Behavior normal.     ED Results / Procedures / Treatments   Labs (all labs ordered are listed, but only abnormal results are displayed) Labs Reviewed - No data to display  EKG None  Radiology DG Thoracic Spine 2 View  Result Date: 11/08/2019 CLINICAL DATA:  Fall, mid back pain EXAM: THORACIC SPINE 2 VIEWS COMPARISON:  None. FINDINGS: There is no evidence of thoracic spine fracture. Alignment is normal. Mild degenerative changes are present. Cholecystectomy clips. IMPRESSION: No compression deformity. Electronically Signed   By: Macy Mis M.D.   On: 11/08/2019 18:43   CT Head Wo Contrast  Result Date: 11/08/2019 CLINICAL DATA:  Head trauma, moderate/severe; fall, right periorbital swelling, headache. Facial trauma. Right superior orbital rim pain and tenderness with swelling after fall. EXAM: CT HEAD WITHOUT CONTRAST CT MAXILLOFACIAL WITHOUT CONTRAST TECHNIQUE: Multidetector CT imaging of the head and maxillofacial structures were performed using the standard protocol without intravenous contrast. Multiplanar CT image reconstructions of the maxillofacial structures were also generated. COMPARISON:  Head CT 06/01/2019. FINDINGS: CT HEAD FINDINGS Brain: Mildly motion degraded examination. Mild generalized cerebral atrophy. There is no acute intracranial hemorrhage. No demarcated cortical infarct. No extra-axial fluid collection. No evidence of intracranial mass. No midline shift. Vascular: No hyperdense vessel. Atherosclerotic calcifications. Skull: Normal. Negative for fracture or focal lesion. Other: Trace fluid within bilateral mastoid air cells. CT MAXILLOFACIAL FINDINGS Osseous: No acute maxillofacial fracture is demonstrated. Orbits: No acute finding. The globes are normal in size  and contour. The extraocular muscles and optic nerve sheath complexes are symmetric and unremarkable. Sinuses: Normally aerated Soft tissues: Prominent right frontotemporal scalp to right periorbital soft tissue swelling/hematoma. Other: Incidentally noted bilateral temporomandibular joint osteoarthrosis. Spondylosis of the partially imaged cervical spine. IMPRESSION: CT head: 1. No evidence of acute intracranial abnormality. 2. Mild generalized cerebral atrophy, stable as compared to the head CT of 06/01/2019. CT maxillofacial: 1. No evidence of acute maxillofacial fracture. 2. Prominent right frontoparietal scalp and right periorbital soft tissue swelling/hematoma. 3. Incidentally noted bilateral temporomandibular joint osteoarthrosis. Electronically Signed   By: Kellie Simmering DO   On: 11/08/2019 18:58   CT Maxillofacial Wo Contrast  Result Date: 11/08/2019 CLINICAL DATA:  Head trauma, moderate/severe; fall, right periorbital swelling, headache. Facial trauma. Right superior orbital rim pain and tenderness with swelling after fall. EXAM: CT HEAD WITHOUT CONTRAST CT MAXILLOFACIAL WITHOUT CONTRAST TECHNIQUE: Multidetector CT imaging of the head and maxillofacial structures were performed using the standard protocol without intravenous contrast. Multiplanar CT image reconstructions of the maxillofacial structures were also generated. COMPARISON:  Head CT 06/01/2019. FINDINGS: CT HEAD FINDINGS Brain: Mildly motion degraded examination. Mild generalized cerebral atrophy. There is no acute intracranial hemorrhage. No demarcated cortical infarct. No extra-axial fluid collection. No evidence of intracranial mass. No midline shift. Vascular: No hyperdense vessel. Atherosclerotic calcifications. Skull: Normal. Negative for fracture or focal lesion. Other: Trace fluid within bilateral mastoid air cells. CT MAXILLOFACIAL FINDINGS Osseous: No acute maxillofacial fracture is demonstrated. Orbits: No acute finding. The  globes are normal in size and contour. The extraocular muscles and optic nerve sheath complexes are symmetric and unremarkable. Sinuses: Normally aerated Soft tissues: Prominent right frontotemporal  scalp to right periorbital soft tissue swelling/hematoma. Other: Incidentally noted bilateral temporomandibular joint osteoarthrosis. Spondylosis of the partially imaged cervical spine. IMPRESSION: CT head: 1. No evidence of acute intracranial abnormality. 2. Mild generalized cerebral atrophy, stable as compared to the head CT of 06/01/2019. CT maxillofacial: 1. No evidence of acute maxillofacial fracture. 2. Prominent right frontoparietal scalp and right periorbital soft tissue swelling/hematoma. 3. Incidentally noted bilateral temporomandibular joint osteoarthrosis. Electronically Signed   By: Kellie Simmering DO   On: 11/08/2019 18:58    Procedures .Marland KitchenLaceration Repair  Date/Time: 11/08/2019 7:21 PM Performed by: Duanne Guess, PA-C Authorized by: Duanne Guess, PA-C   Consent:    Consent obtained:  Verbal   Consent given by:  Patient   Alternatives discussed:  No treatment Anesthesia (see MAR for exact dosages):    Anesthesia method:  None Laceration details:    Location:  Face   Face location:  R eyebrow   Length (cm):  1   Depth (mm):  0.5 Repair type:    Repair type:  Simple Treatment:    Area cleansed with:  Betadine and saline   Amount of cleaning:  Standard Skin repair:    Repair method:  Tissue adhesive Approximation:    Approximation:  Close Post-procedure details:    Dressing:  Open (no dressing)   Patient tolerance of procedure:  Tolerated well, no immediate complications   (including critical care time)  Medications Ordered in ED Medications - No data to display  ED Course  I have reviewed the triage vital signs and the nursing notes.  Pertinent labs & imaging results that were available during my care of the patient were reviewed by me and considered in my  medical decision making (see chart for details).    MDM Rules/Calculators/A&P                          74 year old female with fall after tripping injuring her mid thoracic spine, right forehead just above the eyebrow and having a headache.  X-rays of the thoracic spine and CT scan of the head and maxillofacial area negative for any acute fractures or intracranial abnormalities.  No orbital rim fractures nor muscle entrapment.  Patient appears well.  Pain well controlled.  She is discharged home with recommendations of continuing Tylenol as needed for pain.  She will apply ice to the right eyebrow hematoma.  Dermabond was applied to the small laceration after it was thoroughly irrigated and patient educated on wound care.  Patient will follow-up PCP.  She understands signs symptoms return to the ER for. Final Clinical Impression(s) / ED Diagnoses Final diagnoses:  Fall, initial encounter  Injury of head, initial encounter  Acute midline thoracic back pain  Laceration of brow without complication, initial encounter  Contusion of face, initial encounter    Rx / DC Orders ED Discharge Orders    None       Renata Caprice 11/08/19 Isabella Stalling, MD 11/08/19 2032

## 2019-11-08 NOTE — ED Notes (Addendum)
Pt presents to the ED for mechanical fall. Pt states she did hit her head. Denies LOC. Denies blood thinners. Pt does have a hx of polio.

## 2019-11-08 NOTE — ED Triage Notes (Signed)
Pt in via EMS from home with c/o fall. Pt has hematoma to left eyebrow area. No LOC.

## 2019-11-08 NOTE — ED Triage Notes (Signed)
Pt to ER states she was carrying something in her hands when she tripped and fell, hitting her head on the floor.  Denies LOC, dizziness or other injuries.  Pt with noted hematoma above right eye.  Pt denies blood thinners.  Pt states she has been able to walk since the fall.

## 2019-11-08 NOTE — Discharge Instructions (Addendum)
Please apply ice to the right eyebrow 20 minutes every hour for the next 2 to 3 days.  You may shower and get Dermabond wet.  Allow Dermabond to come off on its own.  Return to the ER for any severe headaches, nausea vomiting worsening symptoms or new changes in your health

## 2019-11-10 DIAGNOSIS — I10 Essential (primary) hypertension: Secondary | ICD-10-CM | POA: Diagnosis not present

## 2019-11-10 DIAGNOSIS — G218 Other secondary parkinsonism: Secondary | ICD-10-CM | POA: Diagnosis not present

## 2019-11-16 ENCOUNTER — Telehealth: Payer: Self-pay

## 2019-11-16 DIAGNOSIS — G8311 Monoplegia of lower limb affecting right dominant side: Secondary | ICD-10-CM | POA: Diagnosis not present

## 2019-11-16 DIAGNOSIS — E538 Deficiency of other specified B group vitamins: Secondary | ICD-10-CM | POA: Diagnosis not present

## 2019-11-16 DIAGNOSIS — G14 Postpolio syndrome: Secondary | ICD-10-CM | POA: Diagnosis not present

## 2019-11-16 NOTE — Telephone Encounter (Signed)
Left a message and asked pt to call and schedule hospital follow up from a recent fall. Susan Shah

## 2019-11-18 ENCOUNTER — Other Ambulatory Visit: Payer: Self-pay

## 2019-11-18 ENCOUNTER — Ambulatory Visit (INDEPENDENT_AMBULATORY_CARE_PROVIDER_SITE_OTHER): Payer: Medicare Other | Admitting: Internal Medicine

## 2019-11-18 ENCOUNTER — Encounter: Payer: Self-pay | Admitting: Internal Medicine

## 2019-11-18 VITALS — BP 150/78 | HR 67 | Temp 97.9°F | Resp 16 | Ht 63.0 in | Wt 163.0 lb

## 2019-11-18 DIAGNOSIS — B370 Candidal stomatitis: Secondary | ICD-10-CM

## 2019-11-18 DIAGNOSIS — G20 Parkinson's disease: Secondary | ICD-10-CM | POA: Diagnosis not present

## 2019-11-18 DIAGNOSIS — M6281 Muscle weakness (generalized): Secondary | ICD-10-CM

## 2019-11-18 DIAGNOSIS — I10 Essential (primary) hypertension: Secondary | ICD-10-CM | POA: Diagnosis not present

## 2019-11-18 DIAGNOSIS — S0011XD Contusion of right eyelid and periocular area, subsequent encounter: Secondary | ICD-10-CM

## 2019-11-18 DIAGNOSIS — B3781 Candidal esophagitis: Secondary | ICD-10-CM | POA: Diagnosis not present

## 2019-11-18 DIAGNOSIS — E876 Hypokalemia: Secondary | ICD-10-CM

## 2019-11-18 DIAGNOSIS — B91 Sequelae of poliomyelitis: Secondary | ICD-10-CM

## 2019-11-18 MED ORDER — LOSARTAN POTASSIUM 100 MG PO TABS: 100.0000 mg | ORAL_TABLET | Freq: Every day | ORAL | 3 refills | Status: DC

## 2019-11-18 MED ORDER — NYSTATIN 100000 UNIT/ML MT SUSP: 5.0000 mL | Freq: Four times a day (QID) | OROMUCOSAL | 0 refills | Status: DC

## 2019-11-18 NOTE — Progress Notes (Signed)
[facility] [place], [place] [address]  Internal MEDICINE  Office Visit Note  Patient Name: Susan Shah  [id number]  [id number]  Date of Service: 11/25/2019     Chief Complaint  Patient presents with  . Hospitalization Follow-up    pt is having hallucinations, she thinks from meds, pt has sores inside mouth, bloodwork pt has says b12 is low  . Anxiety  . Asthma  . Sleep Apnea  . Hypertension  . Hyperlipidemia  . policy update form    reviewed    HPI Pt is here for recent hospital follow up.she presented to the emergency department for evaluation of right-sided facial pain and headache as well as thoracic back pain after a fall.  Around 3 PM today patient was walking when she tripped and fell hitting her head on the floor.  No LOC, nausea or vomiting.  Denies any preceding dizziness, chest pain or shortness of breath.  Patient has a mild headache with large hematoma over the right eyebrow.  She was evaluated in ED , Xrays and CT was negative for any acute FX or pathology and pt is discharged home. She has post polio syndrome right leg paralysis. and recently has diagnosis of parkinsonism.  Her potassium was found to be low in ED. She is taking Sinemet as well ( followed by neurology)  Her other complaint is having metallic taste and rash in her mouth, did take abx about 2 weeks ago   Current Medication: Outpatient Encounter Medications as of 11/18/2019  Medication Sig  . acetaminophen (TYLENOL) 500 MG tablet Take 1,000 mg by mouth every 6 (six) hours as needed for moderate pain or headache.  Marland Kitchen amLODipine (NORVASC) 2.5 MG tablet Take 1 tablet (2.5 mg total) by mouth daily.  Marland Kitchen buPROPion (WELLBUTRIN XL) 150 MG 24 hr tablet Take 1 tablet (150 mg total) by mouth every morning.  . Carboxymethylcellul-Glycerin (LUBRICATING EYE DROPS OP) Place 1 drop into both eyes daily as needed (dry eyes).  . cholecalciferol (VITAMIN D3) 25 MCG (1000 UNIT) tablet Take 1,000  Units by mouth daily.  Marland Kitchen escitalopram (LEXAPRO) 10 MG tablet TAKE ONE TABLET BY MOUTH EVERY DAY AT 5-7 PM  . fluticasone (FLONASE) 50 MCG/ACT nasal spray Place 1 spray into both nostrils at bedtime as needed for allergies or rhinitis.  Marland Kitchen HYDROcodone-acetaminophen (NORCO) 5-325 MG tablet Take 1 tablet by mouth every 4 (four) hours as needed for moderate pain.  Marland Kitchen loratadine (QC LORATADINE ALLERGY RELIEF) 10 MG tablet Take 1 tablet (10 mg total) by mouth daily.  Marland Kitchen losartan (COZAAR) 100 MG tablet Take 1 tablet (100 mg total) by mouth daily.  Marland Kitchen losartan-hydrochlorothiazide (HYZAAR) 100-25 MG tablet Take 1 tablet by mouth daily.  . metoprolol tartrate (LOPRESSOR) 25 MG tablet Take 1 tablet (25 mg total) by mouth 2 (two) times daily with a meal.  . NON FORMULARY cpap device  . nystatin (MYCOSTATIN) 100000 UNIT/ML suspension Take 5 mLs (500,000 Units total) by mouth 4 (four) times daily.  . ondansetron (ZOFRAN-ODT) 4 MG disintegrating tablet Take 1 tablet (4 mg total) by mouth every 8 (eight) hours as needed for nausea or vomiting.  Marland Kitchen oxybutynin (DITROPAN-XL) 10 MG 24 hr tablet Take 2 tablets (20 mg total) by mouth daily.  . pantoprazole (PROTONIX) 40 MG tablet Take 1 tablet (40 mg total) by mouth 2 (two) times daily.  . potassium chloride SA (KLOR-CON) 20 MEQ tablet Take 1 tablet (20 mEq total) by mouth daily.  Marland Kitchen  ranitidine (ZANTAC) 150 MG capsule Take 1 capsule (150 mg total) by mouth 2 (two) times daily.  Marland Kitchen rOPINIRole (REQUIP) 0.5 MG tablet TAKE 1 TABLET BY MOUTH TWICE A DAY AS NEEDED  . simvastatin (ZOCOR) 20 MG tablet TAKE 1 TABLET BY MOUTH DAILY AT 6 PM.  . tiZANidine (ZANAFLEX) 2 MG tablet Take 1 tablet (2 mg total) by mouth 2 (two) times daily as needed for muscle spasms.  . vitamin B-12 (CYANOCOBALAMIN) 1000 MCG tablet Take 1,000 mcg by mouth daily.   No facility-administered encounter medications on file as of 11/18/2019.    Surgical History: Past Surgical History:  Procedure Laterality  Date  . [place] STUDY N/A 04/24/2016   Procedure: 64 HOUR PH STUDY;  Surgeon: Lucilla Lame, MD;  Location: [facility] ENDOSCOPY;  Service: Endoscopy;  Laterality: N/A;  . ABDOMINAL HYSTERECTOMY     Total  . APPENDECTOMY    . CARPOMETACARPAL ([place]) FUSION OF THUMB Left 05/16/2016   Procedure: CARPOMETACARPAL [facility]) FUSION OF THUMB;  Surgeon: Hessie Knows, MD;  Location: [facility] ORS;  Service: Orthopedics;  Laterality: Left;  . CARPOMETACARPAL ([place]) FUSION OF THUMB Left 03/30/2019   Procedure: LEFT THUMB SUSPENSION PLASTY;  Surgeon: Hessie Knows, MD;  Location: [facility] ORS;  Service: Orthopedics;  Laterality: Left;  . CATARACT EXTRACTION Bilateral 12/2012  . CHOLECYSTECTOMY    . COLONOSCOPY  2003  . ESOPHAGEAL MANOMETRY N/A 04/24/2016   Procedure: ESOPHAGEAL MANOMETRY (EM);  Surgeon: Lucilla Lame, MD;  Location: [facility] ENDOSCOPY;  Service: Endoscopy;  Laterality: N/A;  . ESOPHAGOGASTRODUODENOSCOPY  08/2011  . FOOT SURGERY    . HALLUX VALGUS CORRECTION    . HIP SURGERY Right 1950   tendon release r/t polio  . KNEE ARTHROSCOPY Left 06/23/2008  . RETINAL DETACHMENT SURGERY Right 03/2014  . ROTATOR CUFF REPAIR Bilateral   . URINARY SURGERY  2014   [place]    Medical History: Past Medical History:  Diagnosis Date  . Anxiety   . Asthma 2015   mild, seasonal allergy triggered.  . Cancer ([place]) 2013   skin cancer  on left hand  . Depression   . Esophageal dysmotility   . GERD (gastroesophageal reflux disease) 11/05/2012  . History of hiatal hernia   . Hyperlipidemia   . Hypertension   . Incontinence of urine   . Lung nodule   . OA (osteoarthritis)   . Obesity   . Parkinson's disease ([place])   . PONV (postoperative nausea and vomiting)   . Post-polio syndrome    contracted at 74 months old  . Pulmonary nodule 11/05/2012   RML  . Shingles 2009  . Sleep apnea     Family History: Family History  Problem Relation Age of Onset  . Heart disease Mother   . Heart disease Father   . Heart disease  Brother   . Stroke Maternal Grandmother   . Heart disease Maternal Grandfather   . Heart attack Brother     Social History   Socioeconomic History  . Marital status: Married    Spouse name: Not on file  . Number of children: Not on file  . Years of education: Not on file  . Highest education level: Not on file  Occupational History  . Not on file  Tobacco Use  . Smoking status: Former Smoker    Quit date: 05/09/1968    Years since quitting: 51.5  . Smokeless tobacco: Never Used  Vaping Use  . Vaping Use: Never used  Substance and Sexual Activity  .  Alcohol use: No  . Drug use: No  . Sexual activity: Not on file  Other Topics Concern  . Not on file  Social History Narrative  . Not on file   Social Determinants of Health   Financial Resource Strain:   . Difficulty of Paying Living Expenses: Not on file  Food Insecurity:   . Worried About Charity fundraiser in the Last Year: Not on file  . Ran Out of Food in the Last Year: Not on file  Transportation Needs:   . Lack of Transportation (Medical): Not on file  . Lack of Transportation (Non-Medical): Not on file  Physical Activity:   . Days of Exercise per Week: Not on file  . Minutes of Exercise per Session: Not on file  Stress:   . Feeling of Stress : Not on file  Social Connections:   . Frequency of Communication with Friends and Family: Not on file  . Frequency of Social Gatherings with Friends and Family: Not on file  . Attends Religious Services: Not on file  . Active Member of Clubs or Organizations: Not on file  . Attends Archivist Meetings: Not on file  . Marital Status: Not on file  Intimate Partner Violence:   . Fear of Current or Ex-Partner: Not on file  . Emotionally Abused: Not on file  . Physically Abused: Not on file  . Sexually Abused: Not on file      Review of Systems  Constitutional: Negative for chills, diaphoresis and fatigue.  HENT: Positive for mouth sores and postnasal  drip. Negative for ear pain and sinus pressure.   Eyes: Negative for photophobia, discharge, redness, itching and visual disturbance.  Respiratory: Negative for cough, shortness of breath and wheezing.   Cardiovascular: Negative for chest pain, palpitations and leg swelling.  Gastrointestinal: Negative for abdominal pain, constipation, diarrhea, nausea and vomiting.  Genitourinary: Negative for dysuria and flank pain.  Musculoskeletal: Negative for arthralgias, back pain, gait problem and neck pain.  Skin: Positive for color change (bruise on right eye ).  Allergic/Immunologic: Negative for environmental allergies and food allergies.  Neurological: Positive for tremors and weakness. Negative for dizziness and headaches.  Hematological: Does not bruise/bleed easily.  Psychiatric/Behavioral: Negative for agitation, behavioral problems (depression) and hallucinations.    Vital Signs: BP (!) 150/78   Pulse 67   Temp 97.9 F (36.6 C)   Resp 16   Ht 5\' 3"  (1.6 m)   Wt 163 lb (73.9 kg)   SpO2 96%   BMI 28.87 kg/m    Physical Exam Constitutional:      Appearance: Normal appearance.  HENT:     Head: Normocephalic and atraumatic.     Nose: Nose normal.     Mouth/Throat:     Mouth: Mucous membranes are moist.     Pharynx: Posterior oropharyngeal erythema present.  Eyes:     Extraocular Movements: Extraocular movements intact.     Pupils: Pupils are equal, round, and reactive to light.     Comments: Bruising around right eye   Neurological:     Mental Status: She is alert.     Gait: Gait abnormal.     Comments: Right leg brace, post polio syndrome ( paralysis )  Psychiatric:        Thought Content: Thought content normal.    Assessment/Plan: 1. Hypokalemia Pt is on combo therapy with HCTz and losartan , will dc hctz and continue losartan 100 mg po qd  - Basic  metabolic panel  2. Contusion of right eyelid, subsequent encounter Continue to monitor, needs fall precautions    3. Post-polio muscle weakness Right sided leg paralysis. Emotional support is given to pt   4. Essential hypertension, malignant Monitor blood pressure at home  - losartan (COZAAR) 100 MG tablet; Take 1 tablet (100 mg total) by mouth daily.  Dispense: 90 tablet; Refill: 3  5. Parkinson disease [facility]) Per neurology . Pt will get benefit at home for home health, assistance with ADLs   6. Thrush of mouth and esophagus (HCC) - nystatin (MYCOSTATIN) 100000 UNIT/ML suspension; Take 5 mLs (500,000 Units total) by mouth 4 (four) times daily.  Dispense: 60 mL; Refill: 0  General Counseling: Ronia verbalizes understanding of the findings of todays visit and agrees with plan of treatment. I have discussed any further diagnostic evaluation that may be needed or ordered today. We also reviewed her medications today. she has been encouraged to call the office with any questions or concerns that should arise related to todays visit.   Orders Placed This Encounter  Procedures  . Basic metabolic panel   Meds ordered this encounter  Medications  . losartan (COZAAR) 100 MG tablet    Sig: Take 1 tablet (100 mg total) by mouth daily.    Dispense:  90 tablet    Refill:  3  . nystatin (MYCOSTATIN) 100000 UNIT/ML suspension    Sig: Take 5 mLs (500,000 Units total) by mouth 4 (four) times daily.    Dispense:  60 mL    Refill:  0     I have reviewed all medical records from hospital follow up including radiology reports and consults from other physicians. Appropriate follow up diagnostics will be scheduled as needed. Patient/ Family understands the plan of treatment. Time spent 45 minutes.   Dr Lavera Guise, MD Internal Medicine

## 2019-11-22 DIAGNOSIS — Z23 Encounter for immunization: Secondary | ICD-10-CM | POA: Diagnosis not present

## 2019-11-24 ENCOUNTER — Ambulatory Visit: Payer: Medicare Other | Admitting: Internal Medicine

## 2019-11-25 ENCOUNTER — Ambulatory Visit: Payer: Medicare Other | Admitting: Internal Medicine

## 2019-11-30 ENCOUNTER — Other Ambulatory Visit: Payer: Self-pay | Admitting: Nurse Practitioner

## 2019-11-30 ENCOUNTER — Other Ambulatory Visit: Payer: Self-pay

## 2019-11-30 ENCOUNTER — Encounter: Payer: Self-pay | Admitting: Nurse Practitioner

## 2019-11-30 ENCOUNTER — Ambulatory Visit (INDEPENDENT_AMBULATORY_CARE_PROVIDER_SITE_OTHER): Payer: Medicare Other | Admitting: Internal Medicine

## 2019-11-30 VITALS — BP 139/75 | HR 76 | Temp 97.5°F | Resp 16 | Ht 63.0 in | Wt 162.8 lb

## 2019-11-30 DIAGNOSIS — M6281 Muscle weakness (generalized): Secondary | ICD-10-CM

## 2019-11-30 DIAGNOSIS — I10 Essential (primary) hypertension: Secondary | ICD-10-CM

## 2019-11-30 DIAGNOSIS — G20 Parkinson's disease: Secondary | ICD-10-CM

## 2019-11-30 DIAGNOSIS — E876 Hypokalemia: Secondary | ICD-10-CM

## 2019-11-30 DIAGNOSIS — G4733 Obstructive sleep apnea (adult) (pediatric): Secondary | ICD-10-CM | POA: Diagnosis not present

## 2019-11-30 DIAGNOSIS — E559 Vitamin D deficiency, unspecified: Secondary | ICD-10-CM | POA: Diagnosis not present

## 2019-11-30 DIAGNOSIS — B91 Sequelae of poliomyelitis: Secondary | ICD-10-CM

## 2019-11-30 DIAGNOSIS — Z0001 Encounter for general adult medical examination with abnormal findings: Secondary | ICD-10-CM | POA: Diagnosis not present

## 2019-11-30 DIAGNOSIS — Z9989 Dependence on other enabling machines and devices: Secondary | ICD-10-CM

## 2019-11-30 DIAGNOSIS — E782 Mixed hyperlipidemia: Secondary | ICD-10-CM | POA: Diagnosis not present

## 2019-11-30 LAB — LIPID PANEL WITH LDL/HDL RATIO

## 2019-11-30 MED ORDER — ROPINIROLE HCL 0.25 MG PO TABS: 0.2500 mg | ORAL_TABLET | Freq: Three times a day (TID) | ORAL | 3 refills | Status: DC

## 2019-11-30 MED ORDER — CARBIDOPA-LEVODOPA 25-100 MG PO TABS: ORAL_TABLET | ORAL | 3 refills | Status: DC

## 2019-11-30 NOTE — Progress Notes (Signed)
[facility] [place], [place] [address]  Internal MEDICINE  Office Visit Note  Patient Name: Susan Shah  [id number]  [id number]  Date of Service: 11/30/2019  Chief Complaint  Patient presents with  . Follow-up  . Anxiety  . Depression  . Hypertension  . Hyperlipidemia  . controlled med policy update form    received    HPI  She is here with her husband for follow up after ED visit for a fall. On previous visit her Hctz was stopped due to low potassium,s he feels better, will go after this visit for blood test for K. She was aslo seen by neurology and antiPD meds were adjusted. She is on Requip 0.25 mg tid and Sinemet only at night. She continues to be a fall  Risk due to PD and post polio syndrome. She does feel better since last visit.hallucinations have improved . Mouth feels better as well   she presented to the emergency department for evaluation of right-sided facial pain and headache as well as thoracic back pain after a fall, she tripped and fell hitting her head on the floor. No LOC, nausea or vomiting. Denies any preceding dizziness, chest pain or shortness of breath. Patient has a mild headache with large hematoma over the right eyebrow. She was evaluated in ED , Xrays and CT was negative for any acute FX or pathology and pt is discharged home. She has post polio syndrome right leg paralysis. and recently has diagnosis of parkinsonism  Current Medication: Outpatient Encounter Medications as of 11/30/2019  Medication Sig  . acetaminophen (TYLENOL) 500 MG tablet Take 1,000 mg by mouth every 6 (six) hours as needed for moderate pain or headache.  Marland Kitchen amLODipine (NORVASC) 2.5 MG tablet Take 1 tablet (2.5 mg total) by mouth daily.  Marland Kitchen buPROPion (WELLBUTRIN XL) 150 MG 24 hr tablet Take 1 tablet (150 mg total) by mouth every morning.  . Carboxymethylcellul-Glycerin (LUBRICATING EYE DROPS OP) Place 1 drop into both eyes daily as needed (dry eyes).  .  cholecalciferol (VITAMIN D3) 25 MCG (1000 UNIT) tablet Take 1,000 Units by mouth daily.  Marland Kitchen escitalopram (LEXAPRO) 10 MG tablet TAKE ONE TABLET BY MOUTH EVERY DAY AT 5-7 PM  . fluticasone (FLONASE) 50 MCG/ACT nasal spray Place 1 spray into both nostrils at bedtime as needed for allergies or rhinitis.  Marland Kitchen HYDROcodone-acetaminophen (NORCO) 5-325 MG tablet Take 1 tablet by mouth every 4 (four) hours as needed for moderate pain.  Marland Kitchen loratadine (QC LORATADINE ALLERGY RELIEF) 10 MG tablet Take 1 tablet (10 mg total) by mouth daily.  Marland Kitchen losartan (COZAAR) 100 MG tablet Take 1 tablet (100 mg total) by mouth daily.  . metoprolol tartrate (LOPRESSOR) 25 MG tablet Take 1 tablet (25 mg total) by mouth 2 (two) times daily with a meal.  . NON FORMULARY cpap device  . nystatin (MYCOSTATIN) 100000 UNIT/ML suspension Take 5 mLs (500,000 Units total) by mouth 4 (four) times daily.  . ondansetron (ZOFRAN-ODT) 4 MG disintegrating tablet Take 1 tablet (4 mg total) by mouth every 8 (eight) hours as needed for nausea or vomiting.  Marland Kitchen oxybutynin (DITROPAN-XL) 10 MG 24 hr tablet Take 2 tablets (20 mg total) by mouth daily.  . pantoprazole (PROTONIX) 40 MG tablet Take 1 tablet (40 mg total) by mouth 2 (two) times daily.  . potassium chloride SA (KLOR-CON) 20 MEQ tablet Take 1 tablet (20 mEq total) by mouth daily.  . ranitidine (ZANTAC) 150 MG capsule Take 1 capsule (  150 mg total) by mouth 2 (two) times daily.  . simvastatin (ZOCOR) 20 MG tablet TAKE 1 TABLET BY MOUTH DAILY AT 6 PM.  . tiZANidine (ZANAFLEX) 2 MG tablet Take 1 tablet (2 mg total) by mouth 2 (two) times daily as needed for muscle spasms.  . vitamin B-12 (CYANOCOBALAMIN) 1000 MCG tablet Take 1,000 mcg by mouth daily.  . [DISCONTINUED] losartan-hydrochlorothiazide (HYZAAR) 100-25 MG tablet Take 1 tablet by mouth daily.  . [DISCONTINUED] rOPINIRole (REQUIP) 0.5 MG tablet TAKE 1 TABLET BY MOUTH TWICE A DAY AS NEEDED  . carbidopa-levodopa (SINEMET) 25-100 MG tablet Take  one tab po qpm  . rOPINIRole (REQUIP) 0.25 MG tablet Take 1 tablet (0.25 mg total) by mouth 3 (three) times daily.   No facility-administered encounter medications on file as of 11/30/2019.    Surgical History: Past Surgical History:  Procedure Laterality Date  . [place] STUDY N/A 04/24/2016   Procedure: 53 HOUR PH STUDY;  Surgeon: Lucilla Lame, MD;  Location: [facility] ENDOSCOPY;  Service: Endoscopy;  Laterality: N/A;  . ABDOMINAL HYSTERECTOMY     Total  . APPENDECTOMY    . CARPOMETACARPAL ([place]) FUSION OF THUMB Left 05/16/2016   Procedure: CARPOMETACARPAL [facility]) FUSION OF THUMB;  Surgeon: Hessie Knows, MD;  Location: [facility] ORS;  Service: Orthopedics;  Laterality: Left;  . CARPOMETACARPAL ([place]) FUSION OF THUMB Left 03/30/2019   Procedure: LEFT THUMB SUSPENSION PLASTY;  Surgeon: Hessie Knows, MD;  Location: [facility] ORS;  Service: Orthopedics;  Laterality: Left;  . CATARACT EXTRACTION Bilateral 12/2012  . CHOLECYSTECTOMY    . COLONOSCOPY  2003  . ESOPHAGEAL MANOMETRY N/A 04/24/2016   Procedure: ESOPHAGEAL MANOMETRY (EM);  Surgeon: Lucilla Lame, MD;  Location: [facility] ENDOSCOPY;  Service: Endoscopy;  Laterality: N/A;  . ESOPHAGOGASTRODUODENOSCOPY  08/2011  . FOOT SURGERY    . HALLUX VALGUS CORRECTION    . HIP SURGERY Right 1950   tendon release r/t polio  . KNEE ARTHROSCOPY Left 06/23/2008  . RETINAL DETACHMENT SURGERY Right 03/2014  . ROTATOR CUFF REPAIR Bilateral   . URINARY SURGERY  2014   [place]    Medical History: Past Medical History:  Diagnosis Date  . Anxiety   . Asthma 2015   mild, seasonal allergy triggered.  . Cancer ([place]) 2013   skin cancer  on left hand  . Depression   . Esophageal dysmotility   . GERD (gastroesophageal reflux disease) 11/05/2012  . History of hiatal hernia   . Hyperlipidemia   . Hypertension   . Incontinence of urine   . Lung nodule   . OA (osteoarthritis)   . Obesity   . Parkinson's disease ([place])   . PONV (postoperative nausea and vomiting)   .  Post-polio syndrome    contracted at 69 months old  . Pulmonary nodule 11/05/2012   RML  . Shingles 2009  . Sleep apnea     Family History: Family History  Problem Relation Age of Onset  . Heart disease Mother   . Heart disease Father   . Heart disease Brother   . Stroke Maternal Grandmother   . Heart disease Maternal Grandfather   . Heart attack Brother     Social History   Socioeconomic History  . Marital status: Married    Spouse name: Not on file  . Number of children: Not on file  . Years of education: Not on file  . Highest education level: Not on file  Occupational History  . Not on file  Tobacco Use  .  Smoking status: Former Smoker    Quit date: 05/09/1968    Years since quitting: 51.5  . Smokeless tobacco: Never Used  Vaping Use  . Vaping Use: Never used  Substance and Sexual Activity  . Alcohol use: No  . Drug use: No  . Sexual activity: Not on file  Other Topics Concern  . Not on file  Social History Narrative  . Not on file   Social Determinants of Health   Financial Resource Strain:   . Difficulty of Paying Living Expenses: Not on file  Food Insecurity:   . Worried About Charity fundraiser in the Last Year: Not on file  . Ran Out of Food in the Last Year: Not on file  Transportation Needs:   . Lack of Transportation (Medical): Not on file  . Lack of Transportation (Non-Medical): Not on file  Physical Activity:   . Days of Exercise per Week: Not on file  . Minutes of Exercise per Session: Not on file  Stress:   . Feeling of Stress : Not on file  Social Connections:   . Frequency of Communication with Friends and Family: Not on file  . Frequency of Social Gatherings with Friends and Family: Not on file  . Attends Religious Services: Not on file  . Active Member of Clubs or Organizations: Not on file  . Attends Archivist Meetings: Not on file  . Marital Status: Not on file  Intimate Partner Violence:   . Fear of Current or  Ex-Partner: Not on file  . Emotionally Abused: Not on file  . Physically Abused: Not on file  . Sexually Abused: Not on file      Review of Systems  Constitutional: Negative for chills, diaphoresis and fatigue.  HENT: Negative for ear pain, postnasal drip and sinus pressure.   Eyes: Negative for photophobia, discharge, redness, itching and visual disturbance.  Respiratory: Negative for cough, shortness of breath and wheezing.   Cardiovascular: Negative for chest pain, palpitations and leg swelling.  Gastrointestinal: Negative for abdominal pain, constipation, diarrhea, nausea and vomiting.  Genitourinary: Negative for dysuria and flank pain.  Musculoskeletal: Positive for gait problem. Negative for arthralgias, back pain and neck pain.  Skin: Negative for color change.  Allergic/Immunologic: Negative for environmental allergies and food allergies.  Neurological: Negative for dizziness and headaches.  Hematological: Does not bruise/bleed easily.  Psychiatric/Behavioral: Positive for hallucinations. Negative for agitation, behavioral problems (depression) and self-injury.    Vital Signs: BP 139/75   Pulse 76   Temp (!) 97.5 F (36.4 C)   Resp 16   Ht 5\' 3"  (1.6 m)   Wt 162 lb 12.8 oz (73.8 kg)   SpO2 97%   BMI 28.84 kg/m    Physical Exam Constitutional:      General: She is not in acute distress.    Appearance: She is well-developed. She is not diaphoretic.  HENT:     Head: Normocephalic and atraumatic.     Mouth/Throat:     Pharynx: No oropharyngeal exudate.  Eyes:     Pupils: Pupils are equal, round, and reactive to light.  Neck:     Thyroid: No thyromegaly.     Vascular: No JVD.     Trachea: No tracheal deviation.  Cardiovascular:     Rate and Rhythm: Normal rate and regular rhythm.     Heart sounds: Normal heart sounds. No murmur heard.  No friction rub. No gallop.   Pulmonary:     Effort: Pulmonary  effort is normal. No respiratory distress.     Breath  sounds: No wheezing or rales.  Chest:     Chest wall: No tenderness.  Abdominal:     General: Bowel sounds are normal.     Palpations: Abdomen is soft.  Musculoskeletal:        General: Normal range of motion.     Cervical back: Normal range of motion and neck supple.  Lymphadenopathy:     Cervical: No cervical adenopathy.  Skin:    General: Skin is warm and dry.  Neurological:     Mental Status: She is alert and oriented to person, place, and time.     Cranial Nerves: No cranial nerve deficit.  Psychiatric:        Behavior: Behavior normal.        Thought Content: Thought content normal.        Judgment: Judgment normal.        Assessment/Plan: 1. Parkinson disease ([place]) Continue to follow up with neurology as before - Ambulatory referral to [place]  2. Post-polio muscle weakness I advised them for PT for gait and balance training and to avoid falls  - Ambulatory referral to [place]  3. Hypokalemia Schedule for bmp to day   4. Benign hypertension Continue Losartan 100 mg, Norvasc 2.5 mg po qd and metoprolol 25 mg po bid   5. OSA on CPAP She is scheduled to see in 2 days, has not been using her CPAP since she has been having problems with her/teeth and jaw, has been followed up by dentist as well   General Counseling: Hoa verbalizes understanding of the findings of todays visit and agrees with plan of treatment. I have discussed any further diagnostic evaluation that may be needed or ordered today. We also reviewed her medications today. she has been encouraged to call the office with any questions or concerns that should arise related to todays visit.    Orders Placed This Encounter  Procedures  . Ambulatory referral to [place] ordered this encounter  Medications  . rOPINIRole (REQUIP) 0.25 MG tablet    Sig: Take 1 tablet (0.25 mg total) by mouth 3 (three) times daily.    Dispense:  270 tablet    Refill:  3  . carbidopa-levodopa  (SINEMET) 25-100 MG tablet    Sig: Take one tab po qpm    Dispense:  90 tablet    Refill:  3    Total time spent: 35Minutes Time spent includes review of chart, medications, test results, and follow up plan with the patient.      Dr Lavera Guise Internal medicine

## 2019-12-01 LAB — LIPID PANEL WITH LDL/HDL RATIO
Cholesterol, Total: 157 mg/dL (ref 100–199)
HDL: 62 mg/dL (ref >39)
LDL Chol Calc (NIH): 70 mg/dL (ref 0–99)
LDL/HDL Ratio: 1.1 ratio (ref 0.0–3.2)
Triglycerides: 144 mg/dL (ref 0–149)
VLDL Cholesterol Cal: 25 mg/dL (ref 5–40)

## 2019-12-01 LAB — COMPREHENSIVE METABOLIC PANEL
ALT: 6 IU/L (ref 0–32)
AST: 11 IU/L (ref 0–40)
Albumin/Globulin Ratio: 1.8 (ref 1.2–2.2)
Albumin: 4.4 g/dL (ref 3.7–4.7)
Alkaline Phosphatase: 121 IU/L (ref 44–121)
BUN/Creatinine Ratio: 18 (ref 12–28)
BUN: 15 mg/dL (ref 8–27)
Bilirubin Total: 0.3 mg/dL (ref 0.0–1.2)
CO2: 24 mmol/L (ref 20–29)
Calcium: 9.7 mg/dL (ref 8.7–10.3)
Chloride: 101 mmol/L (ref 96–106)
Creatinine, Ser: 0.84 mg/dL (ref 0.57–1.00)
GFR calc Af Amer: 80 mL/min/{1.73_m2} (ref >59)
GFR calc non Af Amer: 69 mL/min/{1.73_m2} (ref >59)
Globulin, Total: 2.4 g/dL (ref 1.5–4.5)
Glucose: 91 mg/dL (ref 65–99)
Potassium: 4.6 mmol/L (ref 3.5–5.2)
Sodium: 140 mmol/L (ref 134–144)
Total Protein: 6.8 g/dL (ref 6.0–8.5)

## 2019-12-01 LAB — CBC
Hematocrit: 38.9 % (ref 34.0–46.6)
Hemoglobin: 12.3 g/dL (ref 11.1–15.9)
MCH: 27.2 pg (ref 26.6–33.0)
MCHC: 31.6 g/dL (ref 31.5–35.7)
MCV: 86 fL (ref 79–97)
Platelets: 377 10*3/uL (ref 150–450)
RBC: 4.53 x10E6/uL (ref 3.77–5.28)
RDW: 13.1 % (ref 11.7–15.4)
WBC: 6.7 10*3/uL (ref 3.4–10.8)

## 2019-12-01 LAB — HCV AB W REFLEX TO QUANT PCR: HCV Ab: <0.1 s/co ratio (ref 0.0–0.9)

## 2019-12-01 LAB — HCV INTERPRETATION

## 2019-12-01 LAB — VITAMIN D 25 HYDROXY (VIT D DEFICIENCY, FRACTURES): Vit D, 25-Hydroxy: 36.6 ng/mL (ref 30.0–100.0)

## 2019-12-01 LAB — T4, FREE: Free T4: 1.36 ng/dL (ref 0.82–1.77)

## 2019-12-01 LAB — TSH: TSH: 1.35 u[IU]/mL (ref 0.450–4.500)

## 2019-12-01 NOTE — Progress Notes (Signed)
Reviewed. Labs look good.

## 2019-12-02 ENCOUNTER — Encounter: Payer: Self-pay | Admitting: Internal Medicine

## 2019-12-02 ENCOUNTER — Ambulatory Visit (INDEPENDENT_AMBULATORY_CARE_PROVIDER_SITE_OTHER): Payer: Medicare Other | Admitting: Internal Medicine

## 2019-12-02 ENCOUNTER — Other Ambulatory Visit: Payer: Self-pay

## 2019-12-02 DIAGNOSIS — J302 Other seasonal allergic rhinitis: Secondary | ICD-10-CM | POA: Diagnosis not present

## 2019-12-02 DIAGNOSIS — Z9989 Dependence on other enabling machines and devices: Secondary | ICD-10-CM | POA: Diagnosis not present

## 2019-12-02 DIAGNOSIS — Z7189 Other specified counseling: Secondary | ICD-10-CM | POA: Diagnosis not present

## 2019-12-02 DIAGNOSIS — G4733 Obstructive sleep apnea (adult) (pediatric): Secondary | ICD-10-CM | POA: Diagnosis not present

## 2019-12-02 NOTE — Progress Notes (Signed)
[facility] [place],  [address]  Pulmonary Sleep Medicine   Office Visit Note  Patient Name: Susan Shah DOB: 1945/07/27 MRN [id number]  Date of Service: 12/02/2019  Complaints/HPI: Patient is being seen for routine pulmonary follow-up Followed for OSA as well as seasonal allergies Has not been wearing her CPAP due to recent diagnosis of TMJ as well as teeth grinding--her dentist has provided her with a mouth guard to wear at night to prevent grinding-she has having difficulties with comfort and her current CPAP mask is unable to fit with mouth guard in place We are in the process of getting her a new machine and will try nasal pillows to improve comfort She denies feeling tired or fatigued during the day without CPAP therapy---husband reports since wearing mouth guard she is no longer snoring Allergies have been well controlled this season  ROS  General: (-) fever, (-) chills, (-) night sweats, (-) weakness Skin: (-) rashes, (-) itching,. Eyes: (-) visual changes, (-) redness, (-) itching. Nose and Sinuses: (-) nasal stuffiness or itchiness, (-) postnasal drip, (-) nosebleeds, (-) sinus trouble. Mouth and Throat: (-) sore throat, (-) hoarseness. Neck: (-) swollen glands, (-) enlarged thyroid, (-) neck pain. Respiratory: - cough, (-) bloody sputum, - shortness of breath, - wheezing. Cardiovascular: - ankle swelling, (-) chest pain. Lymphatic: (-) lymph node enlargement. Neurologic: (-) numbness, (-) tingling. Psychiatric: (-) anxiety, (-) depression   Current Medication: Outpatient Encounter Medications as of 12/02/2019  Medication Sig  . acetaminophen (TYLENOL) 500 MG tablet Take 1,000 mg by mouth every 6 (six) hours as needed for moderate pain or headache.  Marland Kitchen amLODipine (NORVASC) 2.5 MG tablet Take 1 tablet (2.5 mg total) by mouth daily.  Marland Kitchen buPROPion (WELLBUTRIN XL) 150 MG 24 hr tablet Take 1 tablet (150 mg total) by mouth every morning.   . carbidopa-levodopa (SINEMET) 25-100 MG tablet Take one tab po qpm  . Carboxymethylcellul-Glycerin (LUBRICATING EYE DROPS OP) Place 1 drop into both eyes daily as needed (dry eyes).  . cholecalciferol (VITAMIN D3) 25 MCG (1000 UNIT) tablet Take 1,000 Units by mouth daily.  Marland Kitchen escitalopram (LEXAPRO) 10 MG tablet TAKE ONE TABLET BY MOUTH EVERY DAY AT 5-7 PM  . fluticasone (FLONASE) 50 MCG/ACT nasal spray Place 1 spray into both nostrils at bedtime as needed for allergies or rhinitis.  Marland Kitchen HYDROcodone-acetaminophen (NORCO) 5-325 MG tablet Take 1 tablet by mouth every 4 (four) hours as needed for moderate pain.  Marland Kitchen loratadine (QC LORATADINE ALLERGY RELIEF) 10 MG tablet Take 1 tablet (10 mg total) by mouth daily.  Marland Kitchen losartan (COZAAR) 100 MG tablet Take 1 tablet (100 mg total) by mouth daily.  . metoprolol tartrate (LOPRESSOR) 25 MG tablet Take 1 tablet (25 mg total) by mouth 2 (two) times daily with a meal.  . NON FORMULARY cpap device  . nystatin (MYCOSTATIN) 100000 UNIT/ML suspension Take 5 mLs (500,000 Units total) by mouth 4 (four) times daily.  . ondansetron (ZOFRAN-ODT) 4 MG disintegrating tablet Take 1 tablet (4 mg total) by mouth every 8 (eight) hours as needed for nausea or vomiting.  Marland Kitchen oxybutynin (DITROPAN-XL) 10 MG 24 hr tablet Take 2 tablets (20 mg total) by mouth daily.  . pantoprazole (PROTONIX) 40 MG tablet Take 1 tablet (40 mg total) by mouth 2 (two) times daily.  . potassium chloride SA (KLOR-CON) 20 MEQ tablet Take 1 tablet (20 mEq total) by mouth daily.  . ranitidine (ZANTAC) 150 MG capsule Take 1 capsule (150 mg  total) by mouth 2 (two) times daily.  Marland Kitchen rOPINIRole (REQUIP) 0.25 MG tablet Take 1 tablet (0.25 mg total) by mouth 3 (three) times daily.  . simvastatin (ZOCOR) 20 MG tablet TAKE 1 TABLET BY MOUTH DAILY AT 6 PM.  . tiZANidine (ZANAFLEX) 2 MG tablet Take 1 tablet (2 mg total) by mouth 2 (two) times daily as needed for muscle spasms.  . vitamin B-12 (CYANOCOBALAMIN) 1000 MCG  tablet Take 1,000 mcg by mouth daily.   No facility-administered encounter medications on file as of 12/02/2019.    Surgical History: Past Surgical History:  Procedure Laterality Date  . [place] STUDY N/A 04/24/2016   Procedure: 66 HOUR PH STUDY;  Surgeon: Lucilla Lame, MD;  Location: [facility] ENDOSCOPY;  Service: Endoscopy;  Laterality: N/A;  . ABDOMINAL HYSTERECTOMY     Total  . APPENDECTOMY    . CARPOMETACARPAL ([place]) FUSION OF THUMB Left 05/16/2016   Procedure: CARPOMETACARPAL [facility]) FUSION OF THUMB;  Surgeon: Hessie Knows, MD;  Location: [facility] ORS;  Service: Orthopedics;  Laterality: Left;  . CARPOMETACARPAL ([place]) FUSION OF THUMB Left 03/30/2019   Procedure: LEFT THUMB SUSPENSION PLASTY;  Surgeon: Hessie Knows, MD;  Location: [facility] ORS;  Service: Orthopedics;  Laterality: Left;  . CATARACT EXTRACTION Bilateral 12/2012  . CHOLECYSTECTOMY    . COLONOSCOPY  2003  . ESOPHAGEAL MANOMETRY N/A 04/24/2016   Procedure: ESOPHAGEAL MANOMETRY (EM);  Surgeon: Lucilla Lame, MD;  Location: [facility] ENDOSCOPY;  Service: Endoscopy;  Laterality: N/A;  . ESOPHAGOGASTRODUODENOSCOPY  08/2011  . FOOT SURGERY    . HALLUX VALGUS CORRECTION    . HIP SURGERY Right 1950   tendon release r/t polio  . KNEE ARTHROSCOPY Left 06/23/2008  . RETINAL DETACHMENT SURGERY Right 03/2014  . ROTATOR CUFF REPAIR Bilateral   . URINARY SURGERY  2014   [place]    Medical History: Past Medical History:  Diagnosis Date  . Anxiety   . Asthma 2015   mild, seasonal allergy triggered.  . Cancer ([place]) 2013   skin cancer  on left hand  . Depression   . Esophageal dysmotility   . GERD (gastroesophageal reflux disease) 11/05/2012  . History of hiatal hernia   . Hyperlipidemia   . Hypertension   . Incontinence of urine   . Lung nodule   . OA (osteoarthritis)   . Obesity   . Parkinson's disease ([place])   . PONV (postoperative nausea and vomiting)   . Post-polio syndrome    contracted at 48 months old  . Pulmonary nodule  11/05/2012   RML  . Shingles 2009  . Sleep apnea     Family History: Family History  Problem Relation Age of Onset  . Heart disease Mother   . Heart disease Father   . Heart disease Brother   . Stroke Maternal Grandmother   . Heart disease Maternal Grandfather   . Heart attack Brother     Social History: Social History   Socioeconomic History  . Marital status: Married    Spouse name: Not on file  . Number of children: Not on file  . Years of education: Not on file  . Highest education level: Not on file  Occupational History  . Not on file  Tobacco Use  . Smoking status: Former Smoker    Quit date: 05/09/1968    Years since quitting: 51.6  . Smokeless tobacco: Never Used  Vaping Use  . Vaping Use: Never used  Substance and Sexual Activity  . Alcohol use: No  .  Drug use: No  . Sexual activity: Not on file  Other Topics Concern  . Not on file  Social History Narrative  . Not on file   Social Determinants of Health   Financial Resource Strain:   . Difficulty of Paying Living Expenses: Not on file  Food Insecurity:   . Worried About Charity fundraiser in the Last Year: Not on file  . Ran Out of Food in the Last Year: Not on file  Transportation Needs:   . Lack of Transportation (Medical): Not on file  . Lack of Transportation (Non-Medical): Not on file  Physical Activity:   . Days of Exercise per Week: Not on file  . Minutes of Exercise per Session: Not on file  Stress:   . Feeling of Stress : Not on file  Social Connections:   . Frequency of Communication with Friends and Family: Not on file  . Frequency of Social Gatherings with Friends and Family: Not on file  . Attends Religious Services: Not on file  . Active Member of Clubs or Organizations: Not on file  . Attends Archivist Meetings: Not on file  . Marital Status: Not on file  Intimate Partner Violence:   . Fear of Current or Ex-Partner: Not on file  . Emotionally Abused: Not on file   . Physically Abused: Not on file  . Sexually Abused: Not on file    Vital Signs: Blood pressure (!) 154/78, pulse 67, temperature 97.6 F (36.4 C), resp. rate 16, height '5\' 3"'  (1.6 m), weight 165 lb 9.6 oz (75.1 kg), SpO2 97 %.  Examination: General Appearance: The patient is well-developed, well-nourished, and in no distress. Skin: Gross inspection of skin unremarkable. Head: normocephalic, no gross deformities. Eyes: no gross deformities noted. ENT: ears appear grossly normal no exudates. Neck: Supple. No thyromegaly. No LAD. Respiratory: Clear throughout, no rhonchi, wheezing or rales noted. Cardiovascular: Normal S1 and S2 without murmur or rub. Extremities: No cyanosis. pulses are equal. Neurologic: Alert and oriented. No involuntary movements.  LABS: Recent Results (from the past 2160 hour(s))  POCT Urinalysis Dipstick     Status: Abnormal   Collection Time: 09/20/19  2:53 PM  Result Value Ref Range   Color, UA     Clarity, UA     Glucose, UA Negative Negative   Bilirubin, UA Negative    Ketones, UA Trace    Spec Grav, UA 1.010 1.010 - 1.025   Blood, UA Large    pH, UA 7.0 5.0 - 8.0   Protein, UA Positive (A) Negative   Urobilinogen, UA negative (A) 0.2 or 1.0 E.U./dL   Nitrite, UA Positive    Leukocytes, UA Large (3+) (A) Negative   Appearance     Odor    CULTURE, URINE COMPREHENSIVE     Status: Abnormal   Collection Time: 09/20/19  2:55 PM   Specimen: Urine   Urine  Result Value Ref Range   Urine Culture, Comprehensive Final report (A)    Organism ID, Bacteria Proteus mirabilis (A)     Comment: 25,000-50,000 colony forming units per mL Cefazolin <=4 ug/mL Cefazolin with an MIC <=16 predicts susceptibility to the oral agents cefaclor, cefdinir, cefpodoxime, cefprozil, cefuroxime, cephalexin, and loracarbef when used for therapy of uncomplicated urinary tract infections due to E. coli, Klebsiella pneumoniae, and Proteus mirabilis.    Organism ID, Bacteria  Comment (A)     Comment: Escherichia coli, identified by an automated biochemical system. Greater than 100,000 colony forming  units per mL Cefazolin <=4 ug/mL Cefazolin with an MIC <=16 predicts susceptibility to the oral agents cefaclor, cefdinir, cefpodoxime, cefprozil, cefuroxime, cephalexin, and loracarbef when used for therapy of uncomplicated urinary tract infections due to E. coli, Klebsiella pneumoniae, and Proteus mirabilis.    ANTIMICROBIAL SUSCEPTIBILITY Comment     Comment:       ** S = Susceptible; I = Intermediate; R = Resistant **                    P = Positive; N = Negative             MICS are expressed in micrograms per mL    Antibiotic                 RSLT#1    RSLT#2    RSLT#3    RSLT#4 Amoxicillin/Clavulanic Acid              S Ampicillin                     R         S Cefepime                       S         S Ceftriaxone                    S         S Cefuroxime                     S         S Ciprofloxacin                  S         S Ertapenem                      S         S Gentamicin                     S         S Imipenem                                 S Levofloxacin                   S         S Meropenem                      S         S Nitrofurantoin                 R         S Piperacillin/Tazobactam        S         S Tetracycline                   R         S Tobramycin                     S         S Trimethoprim/Sulfa             S  S   Comprehensive metabolic panel     Status: None   Collection Time: 11/30/19  9:54 AM  Result Value Ref Range   Glucose 91 65 - 99 mg/dL   BUN 15 8 - 27 mg/dL   Creatinine, Ser 0.84 0.57 - 1.00 mg/dL   GFR calc non Af Amer 69 >59 mL/min/1.73   GFR calc Af Amer 80 >59 mL/min/1.73    Comment: **In accordance with recommendations from the NKF-ASN Task force,**   [facility] is in the process of updating its eGFR calculation to the   2021 CKD-EPI creatinine equation that estimates kidney function    without a race variable.    BUN/Creatinine Ratio 18 12 - 28   Sodium 140 134 - 144 mmol/L   Potassium 4.6 3.5 - 5.2 mmol/L   Chloride 101 96 - 106 mmol/L   CO2 24 20 - 29 mmol/L   Calcium 9.7 8.7 - 10.3 mg/dL   Total Protein 6.8 6.0 - 8.5 g/dL   Albumin 4.4 3.7 - 4.7 g/dL   Globulin, Total 2.4 1.5 - 4.5 g/dL   Albumin/Globulin Ratio 1.8 1.2 - 2.2   Bilirubin Total 0.3 0.0 - 1.2 mg/dL   Alkaline Phosphatase 121 44 - 121 IU/L    Comment:               **Please note reference interval change**   AST 11 0 - 40 IU/L   ALT 6 0 - 32 IU/L  CBC     Status: None   Collection Time: 11/30/19  9:54 AM  Result Value Ref Range   WBC 6.7 3.4 - 10.8 x10E3/uL   RBC 4.53 3.77 - 5.28 x10E6/uL   Hemoglobin 12.3 11.1 - 15.9 g/dL   Hematocrit 38.9 34.0 - 46.6 %   MCV 86 79 - 97 fL   MCH 27.2 26.6 - 33.0 pg   MCHC 31.6 31 - 35 g/dL   RDW 13.1 11.7 - 15.4 %   Platelets 377 150 - 450 x10E3/uL  Lipid Panel With LDL/HDL Ratio     Status: None   Collection Time: 11/30/19  9:54 AM  Result Value Ref Range   Cholesterol, Total 157 100 - 199 mg/dL   Triglycerides 144 0 - 149 mg/dL   HDL 62 >39 mg/dL   VLDL Cholesterol Cal 25 5 - 40 mg/dL   LDL Chol Calc (NIH) 70 0 - 99 mg/dL   LDL/HDL Ratio 1.1 0.0 - 3.2 ratio    Comment:                                     LDL/HDL Ratio                                             Men  Women                               1/2 Avg.Risk  1.0    1.5                                   Avg.Risk  3.6    3.2  2X Avg.Risk  6.2    5.0                                3X Avg.Risk  8.0    6.1   HCV Ab w Reflex to Quant PCR     Status: None   Collection Time: 11/30/19  9:54 AM  Result Value Ref Range   HCV Ab <0.1 0.0 - 0.9 s/co ratio  T4, free     Status: None   Collection Time: 11/30/19  9:54 AM  Result Value Ref Range   Free T4 1.36 0.82 - 1.77 ng/dL  TSH     Status: None   Collection Time: 11/30/19  9:54 AM  Result Value Ref Range   TSH 1.350  0.450 - 4.500 uIU/mL  VITAMIN D 25 Hydroxy (Vit-D Deficiency, Fractures)     Status: None   Collection Time: 11/30/19  9:54 AM  Result Value Ref Range   Vit D, 25-Hydroxy 36.6 30.0 - 100.0 ng/mL    Comment: Vitamin D deficiency has been defined by the [place] practice guideline as a level of serum 25-OH vitamin D less than 20 ng/mL (1,2). The Endocrine Society went on to further define vitamin D insufficiency as a level between 21 and 29 ng/mL (2). 1. IOM (Institute of Medicine). 2010. Dietary reference    intakes for calcium and D. [place]: The    [employer]. 2. Holick MF, Binkley [place], Bischoff-Ferrari HA, et al.    Evaluation, treatment, and prevention of vitamin D    deficiency: an Endocrine Society clinical practice    guideline. JCEM. 2011 Jul; 96(7):1911-30.   Interpretation:     Status: None   Collection Time: 11/30/19  9:54 AM  Result Value Ref Range   HCV Interp 1: Comment     Comment: Negative Not infected with HCV, unless recent infection is suspected or other evidence exists to indicate HCV infection.     Radiology: DG Thoracic Spine 2 View  Result Date: 11/08/2019 CLINICAL DATA:  Fall, mid back pain EXAM: THORACIC SPINE 2 VIEWS COMPARISON:  None. FINDINGS: There is no evidence of thoracic spine fracture. Alignment is normal. Mild degenerative changes are present. Cholecystectomy clips. IMPRESSION: No compression deformity. Electronically Signed   By: Macy Mis M.D.   On: 11/08/2019 18:43   CT Head Wo Contrast  Result Date: 11/08/2019 CLINICAL DATA:  Head trauma, moderate/severe; fall, right periorbital swelling, headache. Facial trauma. Right superior orbital rim pain and tenderness with swelling after fall. EXAM: CT HEAD WITHOUT CONTRAST CT MAXILLOFACIAL WITHOUT CONTRAST TECHNIQUE: Multidetector CT imaging of the head and maxillofacial structures were performed using the standard protocol without  intravenous contrast. Multiplanar CT image reconstructions of the maxillofacial structures were also generated. COMPARISON:  Head CT 06/01/2019. FINDINGS: CT HEAD FINDINGS Brain: Mildly motion degraded examination. Mild generalized cerebral atrophy. There is no acute intracranial hemorrhage. No demarcated cortical infarct. No extra-axial fluid collection. No evidence of intracranial mass. No midline shift. Vascular: No hyperdense vessel. Atherosclerotic calcifications. Skull: Normal. Negative for fracture or focal lesion. Other: Trace fluid within bilateral mastoid air cells. CT MAXILLOFACIAL FINDINGS Osseous: No acute maxillofacial fracture is demonstrated. Orbits: No acute finding. The globes are normal in size and contour. The extraocular muscles and optic nerve sheath complexes are symmetric and unremarkable. Sinuses: Normally aerated Soft tissues: Prominent right frontotemporal scalp to right periorbital soft tissue swelling/hematoma.  Other: Incidentally noted bilateral temporomandibular joint osteoarthrosis. Spondylosis of the partially imaged cervical spine. IMPRESSION: CT head: 1. No evidence of acute intracranial abnormality. 2. Mild generalized cerebral atrophy, stable as compared to the head CT of 06/01/2019. CT maxillofacial: 1. No evidence of acute maxillofacial fracture. 2. Prominent right frontoparietal scalp and right periorbital soft tissue swelling/hematoma. 3. Incidentally noted bilateral temporomandibular joint osteoarthrosis. Electronically Signed   By: Kellie Simmering DO   On: 11/08/2019 18:58   CT Maxillofacial Wo Contrast  Result Date: 11/08/2019 CLINICAL DATA:  Head trauma, moderate/severe; fall, right periorbital swelling, headache. Facial trauma. Right superior orbital rim pain and tenderness with swelling after fall. EXAM: CT HEAD WITHOUT CONTRAST CT MAXILLOFACIAL WITHOUT CONTRAST TECHNIQUE: Multidetector CT imaging of the head and maxillofacial structures were performed using the  standard protocol without intravenous contrast. Multiplanar CT image reconstructions of the maxillofacial structures were also generated. COMPARISON:  Head CT 06/01/2019. FINDINGS: CT HEAD FINDINGS Brain: Mildly motion degraded examination. Mild generalized cerebral atrophy. There is no acute intracranial hemorrhage. No demarcated cortical infarct. No extra-axial fluid collection. No evidence of intracranial mass. No midline shift. Vascular: No hyperdense vessel. Atherosclerotic calcifications. Skull: Normal. Negative for fracture or focal lesion. Other: Trace fluid within bilateral mastoid air cells. CT MAXILLOFACIAL FINDINGS Osseous: No acute maxillofacial fracture is demonstrated. Orbits: No acute finding. The globes are normal in size and contour. The extraocular muscles and optic nerve sheath complexes are symmetric and unremarkable. Sinuses: Normally aerated Soft tissues: Prominent right frontotemporal scalp to right periorbital soft tissue swelling/hematoma. Other: Incidentally noted bilateral temporomandibular joint osteoarthrosis. Spondylosis of the partially imaged cervical spine. IMPRESSION: CT head: 1. No evidence of acute intracranial abnormality. 2. Mild generalized cerebral atrophy, stable as compared to the head CT of 06/01/2019. CT maxillofacial: 1. No evidence of acute maxillofacial fracture. 2. Prominent right frontoparietal scalp and right periorbital soft tissue swelling/hematoma. 3. Incidentally noted bilateral temporomandibular joint osteoarthrosis. Electronically Signed   By: Kellie Simmering DO   On: 11/08/2019 18:58    No results found.  DG Thoracic Spine 2 View  Result Date: 11/08/2019 CLINICAL DATA:  Fall, mid back pain EXAM: THORACIC SPINE 2 VIEWS COMPARISON:  None. FINDINGS: There is no evidence of thoracic spine fracture. Alignment is normal. Mild degenerative changes are present. Cholecystectomy clips. IMPRESSION: No compression deformity. Electronically Signed   By: Macy Mis M.D.   On: 11/08/2019 18:43   CT Head Wo Contrast  Result Date: 11/08/2019 CLINICAL DATA:  Head trauma, moderate/severe; fall, right periorbital swelling, headache. Facial trauma. Right superior orbital rim pain and tenderness with swelling after fall. EXAM: CT HEAD WITHOUT CONTRAST CT MAXILLOFACIAL WITHOUT CONTRAST TECHNIQUE: Multidetector CT imaging of the head and maxillofacial structures were performed using the standard protocol without intravenous contrast. Multiplanar CT image reconstructions of the maxillofacial structures were also generated. COMPARISON:  Head CT 06/01/2019. FINDINGS: CT HEAD FINDINGS Brain: Mildly motion degraded examination. Mild generalized cerebral atrophy. There is no acute intracranial hemorrhage. No demarcated cortical infarct. No extra-axial fluid collection. No evidence of intracranial mass. No midline shift. Vascular: No hyperdense vessel. Atherosclerotic calcifications. Skull: Normal. Negative for fracture or focal lesion. Other: Trace fluid within bilateral mastoid air cells. CT MAXILLOFACIAL FINDINGS Osseous: No acute maxillofacial fracture is demonstrated. Orbits: No acute finding. The globes are normal in size and contour. The extraocular muscles and optic nerve sheath complexes are symmetric and unremarkable. Sinuses: Normally aerated Soft tissues: Prominent right frontotemporal scalp to right periorbital soft tissue swelling/hematoma. Other: Incidentally noted bilateral  temporomandibular joint osteoarthrosis. Spondylosis of the partially imaged cervical spine. IMPRESSION: CT head: 1. No evidence of acute intracranial abnormality. 2. Mild generalized cerebral atrophy, stable as compared to the head CT of 06/01/2019. CT maxillofacial: 1. No evidence of acute maxillofacial fracture. 2. Prominent right frontoparietal scalp and right periorbital soft tissue swelling/hematoma. 3. Incidentally noted bilateral temporomandibular joint osteoarthrosis. Electronically Signed    By: Kellie Simmering DO   On: 11/08/2019 18:58   CT Maxillofacial Wo Contrast  Result Date: 11/08/2019 CLINICAL DATA:  Head trauma, moderate/severe; fall, right periorbital swelling, headache. Facial trauma. Right superior orbital rim pain and tenderness with swelling after fall. EXAM: CT HEAD WITHOUT CONTRAST CT MAXILLOFACIAL WITHOUT CONTRAST TECHNIQUE: Multidetector CT imaging of the head and maxillofacial structures were performed using the standard protocol without intravenous contrast. Multiplanar CT image reconstructions of the maxillofacial structures were also generated. COMPARISON:  Head CT 06/01/2019. FINDINGS: CT HEAD FINDINGS Brain: Mildly motion degraded examination. Mild generalized cerebral atrophy. There is no acute intracranial hemorrhage. No demarcated cortical infarct. No extra-axial fluid collection. No evidence of intracranial mass. No midline shift. Vascular: No hyperdense vessel. Atherosclerotic calcifications. Skull: Normal. Negative for fracture or focal lesion. Other: Trace fluid within bilateral mastoid air cells. CT MAXILLOFACIAL FINDINGS Osseous: No acute maxillofacial fracture is demonstrated. Orbits: No acute finding. The globes are normal in size and contour. The extraocular muscles and optic nerve sheath complexes are symmetric and unremarkable. Sinuses: Normally aerated Soft tissues: Prominent right frontotemporal scalp to right periorbital soft tissue swelling/hematoma. Other: Incidentally noted bilateral temporomandibular joint osteoarthrosis. Spondylosis of the partially imaged cervical spine. IMPRESSION: CT head: 1. No evidence of acute intracranial abnormality. 2. Mild generalized cerebral atrophy, stable as compared to the head CT of 06/01/2019. CT maxillofacial: 1. No evidence of acute maxillofacial fracture. 2. Prominent right frontoparietal scalp and right periorbital soft tissue swelling/hematoma. 3. Incidentally noted bilateral temporomandibular joint osteoarthrosis.  Electronically Signed   By: Kellie Simmering DO   On: 11/08/2019 18:58      Assessment and Plan: Patient Active Problem List   Diagnosis Date Noted  . Urinary tract infection with hematuria 10/03/2019  . Non-intractable vomiting 10/03/2019  . Dysuria 10/03/2019  . TMJ (temporomandibular joint disorder) 07/21/2019  . Weakness 07/21/2019  . OSA on CPAP 05/02/2019  . Cough due to ACE inhibitor 07/10/2018  . Other fatigue 02/16/2018  . Vertigo 02/08/2018  . Screening for breast cancer 12/17/2017  . Flu vaccine need 10/30/2017  . Need for vaccination against Streptococcus pneumoniae using pneumococcal conjugate vaccine 13 10/30/2017  . Hypokalemia 08/28/2017  . Absolute anemia 08/28/2017  . Benign essential tremor 08/28/2017  . Chest pain 08/25/2017  . Depression 03/23/2017  . Hiatal hernia 04/11/2016  . Anxiety   . OA (osteoarthritis)   . Hyperlipidemia   . Essential hypertension   . Post-polio syndrome   . Lung nodule   . Esophageal dysmotility   . Incontinence of urine   . Obesity   . GERD (gastroesophageal reflux disease) 11/05/2012  . Pulmonary nodule 11/05/2012  . Shingles 01/22/2007   1. OSA on CPAP Will try new CPAP with nasal pillows Continue follow-up with dentist for mouth guard comfort May benefit from retesting with mouth guard in place--may resolve OSA  2. CPAP use counseling Discussed importance of adequate CPAP use as well as proper care and cleaning techniques of machine and all supplies.  3. Seasonal allergies Well controlled on current therapy, continue with monitoring  General Counseling: I have discussed the findings of  the evaluation and examination with [facility].  I have also discussed any further diagnostic evaluation thatmay be needed or ordered today. Loralyn verbalizes understanding of the findings of todays visit. We also reviewed her medications today and discussed drug interactions and side effects including but not limited excessive drowsiness and  altered mental states. We also discussed that there is always a risk not just to her but also people around her. she has been encouraged to call the office with any questions or concerns that should arise related to todays visit.     Time spent: 30  I have personally obtained a history, examined the patient, evaluated laboratory and imaging results, formulated the assessment and plan and placed orders. This patient was seen by Casey Burkitt AGNP-C in Collaboration with Dr. Devona Konig as a part of collaborative care agreement.    [name], MD [facility] Pulmonary and Critical Care Sleep medicine

## 2019-12-06 ENCOUNTER — Encounter: Payer: Self-pay | Admitting: Internal Medicine

## 2019-12-06 NOTE — Patient Instructions (Signed)

## 2019-12-13 ENCOUNTER — Other Ambulatory Visit: Payer: Self-pay

## 2019-12-13 MED ORDER — METOPROLOL TARTRATE 25 MG PO TABS
25.0000 mg | ORAL_TABLET | Freq: Two times a day (BID) | ORAL | 3 refills | Status: DC
Start: 2019-12-13 — End: 2020-03-15

## 2019-12-14 ENCOUNTER — Other Ambulatory Visit: Payer: Self-pay

## 2019-12-14 MED ORDER — OXYBUTYNIN CHLORIDE ER 10 MG PO TB24
20.0000 mg | ORAL_TABLET | Freq: Every day | ORAL | 0 refills | Status: DC
Start: 2019-12-14 — End: 2020-03-13

## 2019-12-15 ENCOUNTER — Other Ambulatory Visit: Payer: Self-pay

## 2019-12-15 MED ORDER — POTASSIUM CHLORIDE CRYS ER 20 MEQ PO TBCR
20.0000 meq | EXTENDED_RELEASE_TABLET | Freq: Every day | ORAL | 1 refills | Status: DC
Start: 2019-12-15 — End: 2021-02-20

## 2019-12-29 DIAGNOSIS — M25511 Pain in right shoulder: Secondary | ICD-10-CM | POA: Diagnosis not present

## 2019-12-29 DIAGNOSIS — M12811 Other specific arthropathies, not elsewhere classified, right shoulder: Secondary | ICD-10-CM | POA: Diagnosis not present

## 2020-01-03 ENCOUNTER — Other Ambulatory Visit: Payer: Self-pay

## 2020-01-03 DIAGNOSIS — I10 Essential (primary) hypertension: Secondary | ICD-10-CM

## 2020-01-03 MED ORDER — LOSARTAN POTASSIUM 100 MG PO TABS: 100.0000 mg | ORAL_TABLET | Freq: Every day | ORAL | 3 refills | Status: DC

## 2020-01-05 ENCOUNTER — Other Ambulatory Visit: Payer: Self-pay

## 2020-01-05 DIAGNOSIS — I10 Essential (primary) hypertension: Secondary | ICD-10-CM

## 2020-01-05 MED ORDER — LOSARTAN POTASSIUM 100 MG PO TABS: 100.0000 mg | ORAL_TABLET | Freq: Every day | ORAL | 1 refills | Status: DC

## 2020-01-07 ENCOUNTER — Telehealth: Payer: Self-pay

## 2020-01-07 ENCOUNTER — Other Ambulatory Visit: Payer: Self-pay

## 2020-01-07 ENCOUNTER — Encounter: Payer: Self-pay | Admitting: Nurse Practitioner

## 2020-01-07 ENCOUNTER — Ambulatory Visit (INDEPENDENT_AMBULATORY_CARE_PROVIDER_SITE_OTHER): Payer: Medicare Other | Admitting: Nurse Practitioner

## 2020-01-07 VITALS — BP 132/70 | HR 81 | Temp 97.3°F | Resp 16 | Ht 63.0 in | Wt 162.2 lb

## 2020-01-07 DIAGNOSIS — Z1231 Encounter for screening mammogram for malignant neoplasm of breast: Secondary | ICD-10-CM | POA: Diagnosis not present

## 2020-01-07 DIAGNOSIS — Z0001 Encounter for general adult medical examination with abnormal findings: Secondary | ICD-10-CM

## 2020-01-07 DIAGNOSIS — R269 Unspecified abnormalities of gait and mobility: Secondary | ICD-10-CM | POA: Diagnosis not present

## 2020-01-07 DIAGNOSIS — R531 Weakness: Secondary | ICD-10-CM | POA: Diagnosis not present

## 2020-01-07 DIAGNOSIS — G20 Parkinson's disease: Secondary | ICD-10-CM | POA: Diagnosis not present

## 2020-01-07 DIAGNOSIS — Z9181 History of falling: Secondary | ICD-10-CM | POA: Insufficient documentation

## 2020-01-07 DIAGNOSIS — I10 Essential (primary) hypertension: Secondary | ICD-10-CM | POA: Diagnosis not present

## 2020-01-07 NOTE — Progress Notes (Signed)
[facility], [place] [address]  Internal MEDICINE  Office Visit Note  Patient Name: Susan Shah  [id number]  [id number]  Date of Service: 01/07/2020   Pt is here for routine health maintenance examination  Chief Complaint  Patient presents with  . Medicare Wellness    Pt has thrush and is taking meds, last night it hurt worse than normal, but today it feels better  . Depression  . Gastroesophageal Reflux  . Hyperlipidemia  . Hypertension  . Sleep Apnea  . Anxiety  . Asthma     The patient is here for health maintenance exam. She is being treated per neurology for Parkinson's disease. Pt will get benefit at home for home health, assistance with ADLs. She has increased weakness and problems with gait. She had fall in October and continues to be at increased risk of falls. She states that some of her symptoms are improving since she was started on low dose sinemet.  -had routine, fasting labs done prior to this visit. All were within normal limits -due to have screening mammogram in January, 2022 -blood pressure well managed.  -no new concerns or complaints today . Marland Kitchen     Current Medication: Outpatient Encounter Medications as of 01/07/2020  Medication Sig  . acetaminophen (TYLENOL) 500 MG tablet Take 1,000 mg by mouth every 6 (six) hours as needed for moderate pain or headache.  Marland Kitchen amLODipine (NORVASC) 2.5 MG tablet Take 1 tablet (2.5 mg total) by mouth daily.  Marland Kitchen buPROPion (WELLBUTRIN XL) 150 MG 24 hr tablet Take 1 tablet (150 mg total) by mouth every morning.  . carbidopa-levodopa (SINEMET) 25-100 MG tablet Take one tab po qpm  . Carboxymethylcellul-Glycerin (LUBRICATING EYE DROPS OP) Place 1 drop into both eyes daily as needed (dry eyes).  . cholecalciferol (VITAMIN D3) 25 MCG (1000 UNIT) tablet Take 1,000 Units by mouth daily.  Marland Kitchen escitalopram (LEXAPRO) 10 MG tablet TAKE ONE TABLET BY MOUTH EVERY DAY AT 5-7 PM  . fluticasone (FLONASE)  50 MCG/ACT nasal spray Place 1 spray into both nostrils at bedtime as needed for allergies or rhinitis.  Marland Kitchen HYDROcodone-acetaminophen (NORCO) 5-325 MG tablet Take 1 tablet by mouth every 4 (four) hours as needed for moderate pain.  Marland Kitchen loratadine (QC LORATADINE ALLERGY RELIEF) 10 MG tablet Take 1 tablet (10 mg total) by mouth daily.  Marland Kitchen losartan (COZAAR) 100 MG tablet Take 1 tablet (100 mg total) by mouth daily.  . metoprolol tartrate (LOPRESSOR) 25 MG tablet Take 1 tablet (25 mg total) by mouth 2 (two) times daily with a meal.  . NON FORMULARY cpap device  . nystatin (MYCOSTATIN) 100000 UNIT/ML suspension Take 5 mLs (500,000 Units total) by mouth 4 (four) times daily.  . ondansetron (ZOFRAN-ODT) 4 MG disintegrating tablet Take 1 tablet (4 mg total) by mouth every 8 (eight) hours as needed for nausea or vomiting.  Marland Kitchen oxybutynin (DITROPAN-XL) 10 MG 24 hr tablet Take 2 tablets (20 mg total) by mouth daily.  . pantoprazole (PROTONIX) 40 MG tablet Take 1 tablet (40 mg total) by mouth 2 (two) times daily.  . potassium chloride SA (KLOR-CON) 20 MEQ tablet Take 1 tablet (20 mEq total) by mouth daily.  . ranitidine (ZANTAC) 150 MG capsule Take 1 capsule (150 mg total) by mouth 2 (two) times daily.  Marland Kitchen rOPINIRole (REQUIP) 0.25 MG tablet Take 1 tablet (0.25 mg total) by mouth 3 (three) times daily.  . simvastatin (ZOCOR) 20 MG tablet TAKE 1 TABLET BY  MOUTH DAILY AT 6 PM.  . tiZANidine (ZANAFLEX) 2 MG tablet Take 1 tablet (2 mg total) by mouth 2 (two) times daily as needed for muscle spasms.  . vitamin B-12 (CYANOCOBALAMIN) 1000 MCG tablet Take 1,000 mcg by mouth daily.   No facility-administered encounter medications on file as of 01/07/2020.    Surgical History: Past Surgical History:  Procedure Laterality Date  . [place] STUDY N/A 04/24/2016   Procedure: 3 HOUR PH STUDY;  Surgeon: Lucilla Lame, MD;  Location: [facility] ENDOSCOPY;  Service: Endoscopy;  Laterality: N/A;  . ABDOMINAL HYSTERECTOMY     Total  .  APPENDECTOMY    . CARPOMETACARPAL ([place]) FUSION OF THUMB Left 05/16/2016   Procedure: CARPOMETACARPAL [facility]) FUSION OF THUMB;  Surgeon: Hessie Knows, MD;  Location: [facility] ORS;  Service: Orthopedics;  Laterality: Left;  . CARPOMETACARPAL ([place]) FUSION OF THUMB Left 03/30/2019   Procedure: LEFT THUMB SUSPENSION PLASTY;  Surgeon: Hessie Knows, MD;  Location: [facility] ORS;  Service: Orthopedics;  Laterality: Left;  . CATARACT EXTRACTION Bilateral 12/2012  . CHOLECYSTECTOMY    . COLONOSCOPY  2003  . ESOPHAGEAL MANOMETRY N/A 04/24/2016   Procedure: ESOPHAGEAL MANOMETRY (EM);  Surgeon: Lucilla Lame, MD;  Location: [facility] ENDOSCOPY;  Service: Endoscopy;  Laterality: N/A;  . ESOPHAGOGASTRODUODENOSCOPY  08/2011  . FOOT SURGERY    . HALLUX VALGUS CORRECTION    . HIP SURGERY Right 1950   tendon release r/t polio  . KNEE ARTHROSCOPY Left 06/23/2008  . RETINAL DETACHMENT SURGERY Right 03/2014  . ROTATOR CUFF REPAIR Bilateral   . URINARY SURGERY  2014   [place]    Medical History: Past Medical History:  Diagnosis Date  . Anxiety   . Asthma 2015   mild, seasonal allergy triggered.  . Cancer ([place]) 2013   skin cancer  on left hand  . Depression   . Esophageal dysmotility   . GERD (gastroesophageal reflux disease) 11/05/2012  . History of hiatal hernia   . Hyperlipidemia   . Hypertension   . Incontinence of urine   . Lung nodule   . OA (osteoarthritis)   . Obesity   . Parkinson's disease ([place])   . PONV (postoperative nausea and vomiting)   . Post-polio syndrome    contracted at 36 months old  . Pulmonary nodule 11/05/2012   RML  . Shingles 2009  . Sleep apnea     Family History: Family History  Problem Relation Age of Onset  . Heart disease Mother   . Heart disease Father   . Heart disease Brother   . Stroke Maternal Grandmother   . Heart disease Maternal Grandfather   . Heart attack Brother       Review of Systems  Constitutional: Positive for activity change and fatigue. Negative  for chills and unexpected weight change.  HENT: Negative for congestion, postnasal drip, rhinorrhea, sneezing and sore throat.   Respiratory: Negative for cough, chest tightness, shortness of breath and wheezing.   Cardiovascular: Negative for chest pain and palpitations.       Stable blood pressure   Gastrointestinal: Negative for abdominal pain, constipation, diarrhea, nausea and vomiting.  Endocrine: Negative for cold intolerance, heat intolerance, polydipsia and polyuria.  Genitourinary: Negative for dysuria and frequency.  Musculoskeletal: Positive for gait problem. Negative for arthralgias, back pain, joint swelling and neck pain.  Skin: Negative for rash.  Allergic/Immunologic: Negative for environmental allergies.  Neurological: Positive for tremors and weakness. Negative for numbness.       Currently being  treated per neurology for parkinson's disease.   Hematological: Negative for adenopathy. Does not bruise/bleed easily.  Psychiatric/Behavioral: Negative for behavioral problems (Depression), sleep disturbance and suicidal ideas. The patient is not nervous/anxious.     Today's Vitals   01/07/20 1104  BP: 132/70  Pulse: 81  Resp: 16  Temp: (!) 97.3 F (36.3 C)  SpO2: 99%  Weight: 162 lb 3.2 oz (73.6 kg)  Height: '5\' 3"'  (1.6 m)   Body mass index is 28.73 kg/m.  Physical Exam Vitals and nursing note reviewed.  Constitutional:      General: She is not in acute distress.    Appearance: Normal appearance. She is well-developed and well-nourished. She is not diaphoretic.  HENT:     Head: Normocephalic and atraumatic.     Nose: Nose normal.     Mouth/Throat:     Mouth: Oropharynx is clear and moist.     Pharynx: No oropharyngeal exudate.  Eyes:     Extraocular Movements: EOM normal.     Pupils: Pupils are equal, round, and reactive to light.  Neck:     Thyroid: No thyromegaly.     Vascular: No carotid bruit or JVD.     Trachea: No tracheal deviation.  Cardiovascular:      Rate and Rhythm: Normal rate and regular rhythm.     Pulses: Normal pulses.     Heart sounds: Normal heart sounds. No murmur heard. No friction rub. No gallop.   Pulmonary:     Effort: Pulmonary effort is normal. No respiratory distress.     Breath sounds: Normal breath sounds. No wheezing or rales.  Chest:     Chest wall: No tenderness.  Abdominal:     General: Bowel sounds are normal.     Palpations: Abdomen is soft.     Tenderness: There is no abdominal tenderness.  Musculoskeletal:        General: Normal range of motion.     Cervical back: Normal range of motion and neck supple.     Comments: Patient using crutch to help with ambulation.   Lymphadenopathy:     Cervical: No cervical adenopathy.  Skin:    General: Skin is warm and dry.  Neurological:     Mental Status: She is alert and oriented to person, place, and time. Mental status is at baseline.     Cranial Nerves: No cranial nerve deficit.  Psychiatric:        Mood and Affect: Mood and affect and mood normal.        Behavior: Behavior normal.        Thought Content: Thought content normal.        Judgment: Judgment normal.    Depression screen [facility] 2/9 11/30/2019 11/18/2019 10/06/2019 08/19/2019 07/13/2019  Decreased Interest 0 0 1 0 1  Down, Depressed, Hopeless 0 0 0 0 1  PHQ - 2 Score 0 0 1 0 2  Altered sleeping - - - - 1  Tired, decreased energy - - - - 3  Change in appetite - - - - 0  Feeling bad or failure about yourself  - - - - 0  Trouble concentrating - - - - 1  Moving slowly or fidgety/restless - - - - 1  Suicidal thoughts - - - - 0  PHQ-9 Score - - - - 8    Functional Status Survey: Is the patient deaf or have difficulty hearing?: Yes Does the patient have difficulty seeing, even when wearing glasses/contacts?: No Does  the patient have difficulty concentrating, remembering, or making decisions?: Yes Does the patient have difficulty walking or climbing stairs?: Yes Does the patient have difficulty  dressing or bathing?: No Does the patient have difficulty doing errands alone such as visiting a doctor's office or shopping?: Yes  MMSE - [place] Exam 01/07/2020 01/06/2019 12/15/2017  Orientation to time '5 5 5  ' Orientation to Place '5 5 5  ' Registration '3 3 3  ' Attention/ Calculation '5 5 5  ' Recall '3 3 3  ' Language- name 2 objects '2 2 2  ' Language- repeat '1 1 1  ' Language- follow 3 step command '3 3 3  ' Language- read & follow direction '1 1 1  ' Write a sentence 0 1 1  Copy design '1 1 1  ' Total score '29 30 30    ' Fall Risk  01/07/2020 11/30/2019 11/18/2019 10/06/2019 08/19/2019  Falls in the past year? '1 1 1 1 1  ' Comment - - - - -  Number falls in past yr: 1 0 '1 1 1  ' Comment - - 4 in the last 2 months - -  Injury with Fall? 0 0 0 1 1  Comment - - pt says she hurt her back but x rays were done at hospital and pt was told everything seems fine - fell and hit head  Risk for fall due to : Impaired balance/gait;Impaired mobility - Impaired balance/gait Impaired balance/gait -  Follow up Falls evaluation completed - Falls evaluation completed Falls evaluation completed -      LABS: Recent Results (from the past 2160 hour(s))  Comprehensive metabolic panel     Status: None   Collection Time: 11/30/19  9:54 AM  Result Value Ref Range   Glucose 91 65 - 99 mg/dL   BUN 15 8 - 27 mg/dL   Creatinine, Ser 0.84 0.57 - 1.00 mg/dL   GFR calc non Af Amer 69 >59 mL/min/1.73   GFR calc Af Amer 80 >59 mL/min/1.73    Comment: **In accordance with recommendations from the NKF-ASN Task force,**   [facility] is in the process of updating its eGFR calculation to the   2021 CKD-EPI creatinine equation that estimates kidney function   without a race variable.    BUN/Creatinine Ratio 18 12 - 28   Sodium 140 134 - 144 mmol/L   Potassium 4.6 3.5 - 5.2 mmol/L   Chloride 101 96 - 106 mmol/L   CO2 24 20 - 29 mmol/L   Calcium 9.7 8.7 - 10.3 mg/dL   Total Protein 6.8 6.0 - 8.5 g/dL   Albumin 4.4 3.7 - 4.7  g/dL   Globulin, Total 2.4 1.5 - 4.5 g/dL   Albumin/Globulin Ratio 1.8 1.2 - 2.2   Bilirubin Total 0.3 0.0 - 1.2 mg/dL   Alkaline Phosphatase 121 44 - 121 IU/L    Comment:               **Please note reference interval change**   AST 11 0 - 40 IU/L   ALT 6 0 - 32 IU/L  CBC     Status: None   Collection Time: 11/30/19  9:54 AM  Result Value Ref Range   WBC 6.7 3.4 - 10.8 x10E3/uL   RBC 4.53 3.77 - 5.28 x10E6/uL   Hemoglobin 12.3 11.1 - 15.9 g/dL   Hematocrit 38.9 34.0 - 46.6 %   MCV 86 79 - 97 fL   MCH 27.2 26.6 - 33.0 pg   MCHC 31.6 31.5 - 35.7 g/dL  RDW 13.1 11.7 - 15.4 %   Platelets 377 150 - 450 x10E3/uL  Lipid Panel With LDL/HDL Ratio     Status: None   Collection Time: 11/30/19  9:54 AM  Result Value Ref Range   Cholesterol, Total 157 100 - 199 mg/dL   Triglycerides 144 0 - 149 mg/dL   HDL 62 >39 mg/dL   VLDL Cholesterol Cal 25 5 - 40 mg/dL   LDL Chol Calc (NIH) 70 0 - 99 mg/dL   LDL/HDL Ratio 1.1 0.0 - 3.2 ratio    Comment:                                     LDL/HDL Ratio                                             Men  Women                               1/2 Avg.Risk  1.0    1.5                                   Avg.Risk  3.6    3.2                                2X Avg.Risk  6.2    5.0                                3X Avg.Risk  8.0    6.1   HCV Ab w Reflex to Quant PCR     Status: None   Collection Time: 11/30/19  9:54 AM  Result Value Ref Range   HCV Ab <0.1 0.0 - 0.9 s/co ratio  T4, free     Status: None   Collection Time: 11/30/19  9:54 AM  Result Value Ref Range   Free T4 1.36 0.82 - 1.77 ng/dL  TSH     Status: None   Collection Time: 11/30/19  9:54 AM  Result Value Ref Range   TSH 1.350 0.450 - 4.500 uIU/mL  VITAMIN D 25 Hydroxy (Vit-D Deficiency, Fractures)     Status: None   Collection Time: 11/30/19  9:54 AM  Result Value Ref Range   Vit D, 25-Hydroxy 36.6 30.0 - 100.0 ng/mL    Comment: Vitamin D deficiency has been defined by the [place] practice guideline as a level of serum 25-OH vitamin D less than 20 ng/mL (1,2). The Endocrine Society went on to further define vitamin D insufficiency as a level between 21 and 29 ng/mL (2). 1. IOM (Institute of Medicine). 2010. Dietary reference    intakes for calcium and D. [place]: The    [employer]. 2. Holick MF, Binkley Dauphin, Bischoff-Ferrari HA, et al.    Evaluation, treatment, and prevention of vitamin D    deficiency: an Endocrine Society clinical practice    guideline. JCEM. 2011 Jul; 96(7):1911-30.   Interpretation:     Status: None  Collection Time: 11/30/19  9:54 AM  Result Value Ref Range   HCV Interp 1: Comment     Comment: Negative Not infected with HCV, unless recent infection is suspected or other evidence exists to indicate HCV infection.     Assessment/Plan: 1. Encounter for general adult medical examination with abnormal findings Annual health maintenance exam today  2. Benign hypertension Stable. Continue bp medication s prescribed   3. Parkinson disease ([place]) Pt will get benefit at home for home health, assistance with ADLs. She has increased weakness and problems with gait. Referral made to home health/physical therapy for further evaluation and treatment.  - Ambulatory referral to [place]  4. At high risk for falls Pt will get benefit at home for home health, assistance with ADLs. She has increased weakness and problems with gait. Referral made to home health/physical therapy for further evaluation and treatment.  - Ambulatory referral to Home Health  5. Generalized weakness Pt will get benefit at home for home health, assistance with ADLs. She has increased weakness and problems with gait. Referral made to home health/physical therapy for further evaluation and treatment.  - Ambulatory referral to [place]  6. Abnormality of gait and mobility Pt will get benefit at home for home  health, assistance with ADLs. She has increased weakness and problems with gait. Referral made to home health/physical therapy for further evaluation and treatment.  - Ambulatory referral to Home Health  7. Encounter for screening mammogram for malignant neoplasm of breast - MM DIGITAL SCREENING BILATERAL; Future  General Counseling: Jeryn verbalizes understanding of the findings of todays visit and agrees with plan of treatment. I have discussed any further diagnostic evaluation that may be needed or ordered today. We also reviewed her medications today. she has been encouraged to call the office with any questions or concerns that should arise related to todays visit.    Counseling:  This patient was seen by Leretha Pol FNP Collaboration with Dr Lavera Guise as a part of collaborative care agreement  Orders Placed This Encounter  Procedures  . MM DIGITAL SCREENING BILATERAL  . Ambulatory referral to Home Health      Total time spent: 60 Minutes  Time spent includes review of chart, medications, test results, and follow up plan with the patient.     Lavera Guise, MD  Internal Medicine

## 2020-01-07 NOTE — Telephone Encounter (Signed)
SEND TO [place]

## 2020-01-11 DIAGNOSIS — I10 Essential (primary) hypertension: Secondary | ICD-10-CM | POA: Diagnosis not present

## 2020-01-11 DIAGNOSIS — K219 Gastro-esophageal reflux disease without esophagitis: Secondary | ICD-10-CM | POA: Diagnosis not present

## 2020-01-11 DIAGNOSIS — J45909 Unspecified asthma, uncomplicated: Secondary | ICD-10-CM | POA: Diagnosis not present

## 2020-01-11 DIAGNOSIS — Z79899 Other long term (current) drug therapy: Secondary | ICD-10-CM | POA: Diagnosis not present

## 2020-01-11 DIAGNOSIS — G20 Parkinson's disease: Secondary | ICD-10-CM | POA: Diagnosis not present

## 2020-01-11 DIAGNOSIS — Z85828 Personal history of other malignant neoplasm of skin: Secondary | ICD-10-CM | POA: Diagnosis not present

## 2020-01-11 DIAGNOSIS — E785 Hyperlipidemia, unspecified: Secondary | ICD-10-CM | POA: Diagnosis not present

## 2020-01-11 DIAGNOSIS — M1990 Unspecified osteoarthritis, unspecified site: Secondary | ICD-10-CM | POA: Diagnosis not present

## 2020-01-11 DIAGNOSIS — Z9842 Cataract extraction status, left eye: Secondary | ICD-10-CM | POA: Diagnosis not present

## 2020-01-11 DIAGNOSIS — F32A Depression, unspecified: Secondary | ICD-10-CM | POA: Diagnosis not present

## 2020-01-11 DIAGNOSIS — H9193 Unspecified hearing loss, bilateral: Secondary | ICD-10-CM | POA: Diagnosis not present

## 2020-01-11 DIAGNOSIS — D649 Anemia, unspecified: Secondary | ICD-10-CM | POA: Diagnosis not present

## 2020-01-11 DIAGNOSIS — G14 Postpolio syndrome: Secondary | ICD-10-CM | POA: Diagnosis not present

## 2020-01-11 DIAGNOSIS — Z87440 Personal history of urinary (tract) infections: Secondary | ICD-10-CM | POA: Diagnosis not present

## 2020-01-11 DIAGNOSIS — K5900 Constipation, unspecified: Secondary | ICD-10-CM | POA: Diagnosis not present

## 2020-01-11 DIAGNOSIS — G4733 Obstructive sleep apnea (adult) (pediatric): Secondary | ICD-10-CM | POA: Diagnosis not present

## 2020-01-11 DIAGNOSIS — Z9841 Cataract extraction status, right eye: Secondary | ICD-10-CM | POA: Diagnosis not present

## 2020-01-11 DIAGNOSIS — Z9181 History of falling: Secondary | ICD-10-CM | POA: Diagnosis not present

## 2020-01-17 ENCOUNTER — Telehealth: Payer: Self-pay

## 2020-01-17 DIAGNOSIS — F32A Depression, unspecified: Secondary | ICD-10-CM | POA: Diagnosis not present

## 2020-01-17 DIAGNOSIS — I10 Essential (primary) hypertension: Secondary | ICD-10-CM | POA: Diagnosis not present

## 2020-01-17 DIAGNOSIS — G14 Postpolio syndrome: Secondary | ICD-10-CM | POA: Diagnosis not present

## 2020-01-17 DIAGNOSIS — G20 Parkinson's disease: Secondary | ICD-10-CM | POA: Diagnosis not present

## 2020-01-17 DIAGNOSIS — K219 Gastro-esophageal reflux disease without esophagitis: Secondary | ICD-10-CM | POA: Diagnosis not present

## 2020-01-17 DIAGNOSIS — J45909 Unspecified asthma, uncomplicated: Secondary | ICD-10-CM | POA: Diagnosis not present

## 2020-01-17 NOTE — Telephone Encounter (Signed)
Phone number for Occ therapy [place] is [phone]

## 2020-01-17 NOTE — Telephone Encounter (Signed)
Susan Shah from [facility] called asking for orders for Occupational Therapy for 2 x a week for 2 weeks and 1 x week for 2 weeks.  I gave the ok for the orders.

## 2020-01-18 DIAGNOSIS — J45909 Unspecified asthma, uncomplicated: Secondary | ICD-10-CM | POA: Diagnosis not present

## 2020-01-18 DIAGNOSIS — F32A Depression, unspecified: Secondary | ICD-10-CM | POA: Diagnosis not present

## 2020-01-18 DIAGNOSIS — I10 Essential (primary) hypertension: Secondary | ICD-10-CM | POA: Diagnosis not present

## 2020-01-18 DIAGNOSIS — K219 Gastro-esophageal reflux disease without esophagitis: Secondary | ICD-10-CM | POA: Diagnosis not present

## 2020-01-18 DIAGNOSIS — G20 Parkinson's disease: Secondary | ICD-10-CM | POA: Diagnosis not present

## 2020-01-18 DIAGNOSIS — G14 Postpolio syndrome: Secondary | ICD-10-CM | POA: Diagnosis not present

## 2020-01-19 DIAGNOSIS — F32A Depression, unspecified: Secondary | ICD-10-CM | POA: Diagnosis not present

## 2020-01-19 DIAGNOSIS — J45909 Unspecified asthma, uncomplicated: Secondary | ICD-10-CM | POA: Diagnosis not present

## 2020-01-19 DIAGNOSIS — I10 Essential (primary) hypertension: Secondary | ICD-10-CM | POA: Diagnosis not present

## 2020-01-19 DIAGNOSIS — K219 Gastro-esophageal reflux disease without esophagitis: Secondary | ICD-10-CM | POA: Diagnosis not present

## 2020-01-19 DIAGNOSIS — G14 Postpolio syndrome: Secondary | ICD-10-CM | POA: Diagnosis not present

## 2020-01-19 DIAGNOSIS — G20 Parkinson's disease: Secondary | ICD-10-CM | POA: Diagnosis not present

## 2020-01-24 ENCOUNTER — Other Ambulatory Visit: Payer: Self-pay | Admitting: Adult Health

## 2020-01-24 DIAGNOSIS — I10 Essential (primary) hypertension: Secondary | ICD-10-CM

## 2020-01-26 DIAGNOSIS — G20 Parkinson's disease: Secondary | ICD-10-CM | POA: Diagnosis not present

## 2020-01-26 DIAGNOSIS — I10 Essential (primary) hypertension: Secondary | ICD-10-CM | POA: Diagnosis not present

## 2020-01-26 DIAGNOSIS — G14 Postpolio syndrome: Secondary | ICD-10-CM | POA: Diagnosis not present

## 2020-01-26 DIAGNOSIS — F32A Depression, unspecified: Secondary | ICD-10-CM | POA: Diagnosis not present

## 2020-01-26 DIAGNOSIS — K219 Gastro-esophageal reflux disease without esophagitis: Secondary | ICD-10-CM | POA: Diagnosis not present

## 2020-01-26 DIAGNOSIS — J45909 Unspecified asthma, uncomplicated: Secondary | ICD-10-CM | POA: Diagnosis not present

## 2020-01-27 DIAGNOSIS — J45909 Unspecified asthma, uncomplicated: Secondary | ICD-10-CM | POA: Diagnosis not present

## 2020-01-27 DIAGNOSIS — G14 Postpolio syndrome: Secondary | ICD-10-CM | POA: Diagnosis not present

## 2020-01-27 DIAGNOSIS — G20 Parkinson's disease: Secondary | ICD-10-CM | POA: Diagnosis not present

## 2020-01-27 DIAGNOSIS — K219 Gastro-esophageal reflux disease without esophagitis: Secondary | ICD-10-CM | POA: Diagnosis not present

## 2020-01-27 DIAGNOSIS — F32A Depression, unspecified: Secondary | ICD-10-CM | POA: Diagnosis not present

## 2020-01-27 DIAGNOSIS — I10 Essential (primary) hypertension: Secondary | ICD-10-CM | POA: Diagnosis not present

## 2020-01-28 ENCOUNTER — Telehealth: Payer: Self-pay

## 2020-01-28 NOTE — Telephone Encounter (Signed)
[facility] Jeneen Rinks [phone] called for Just FYI that pt called them and canceled  appt for today they will call back and make reschedule

## 2020-02-01 DIAGNOSIS — J45909 Unspecified asthma, uncomplicated: Secondary | ICD-10-CM | POA: Diagnosis not present

## 2020-02-01 DIAGNOSIS — F32A Depression, unspecified: Secondary | ICD-10-CM | POA: Diagnosis not present

## 2020-02-01 DIAGNOSIS — G20 Parkinson's disease: Secondary | ICD-10-CM | POA: Diagnosis not present

## 2020-02-01 DIAGNOSIS — G14 Postpolio syndrome: Secondary | ICD-10-CM | POA: Diagnosis not present

## 2020-02-01 DIAGNOSIS — I10 Essential (primary) hypertension: Secondary | ICD-10-CM | POA: Diagnosis not present

## 2020-02-01 DIAGNOSIS — K219 Gastro-esophageal reflux disease without esophagitis: Secondary | ICD-10-CM | POA: Diagnosis not present

## 2020-02-08 ENCOUNTER — Other Ambulatory Visit: Payer: Self-pay

## 2020-02-08 MED ORDER — ESCITALOPRAM OXALATE 10 MG PO TABS
10.0000 mg | ORAL_TABLET | Freq: Every day | ORAL | 1 refills | Status: DC
Start: 2020-02-08 — End: 2020-05-02

## 2020-02-09 DIAGNOSIS — G20 Parkinson's disease: Secondary | ICD-10-CM | POA: Diagnosis not present

## 2020-02-09 DIAGNOSIS — I10 Essential (primary) hypertension: Secondary | ICD-10-CM | POA: Diagnosis not present

## 2020-02-09 DIAGNOSIS — F32A Depression, unspecified: Secondary | ICD-10-CM | POA: Diagnosis not present

## 2020-02-09 DIAGNOSIS — G14 Postpolio syndrome: Secondary | ICD-10-CM | POA: Diagnosis not present

## 2020-02-09 DIAGNOSIS — J45909 Unspecified asthma, uncomplicated: Secondary | ICD-10-CM | POA: Diagnosis not present

## 2020-02-09 DIAGNOSIS — K219 Gastro-esophageal reflux disease without esophagitis: Secondary | ICD-10-CM | POA: Diagnosis not present

## 2020-02-10 DIAGNOSIS — F32A Depression, unspecified: Secondary | ICD-10-CM | POA: Diagnosis not present

## 2020-02-10 DIAGNOSIS — Z85828 Personal history of other malignant neoplasm of skin: Secondary | ICD-10-CM | POA: Diagnosis not present

## 2020-02-10 DIAGNOSIS — Z9841 Cataract extraction status, right eye: Secondary | ICD-10-CM | POA: Diagnosis not present

## 2020-02-10 DIAGNOSIS — J45909 Unspecified asthma, uncomplicated: Secondary | ICD-10-CM | POA: Diagnosis not present

## 2020-02-10 DIAGNOSIS — G14 Postpolio syndrome: Secondary | ICD-10-CM | POA: Diagnosis not present

## 2020-02-10 DIAGNOSIS — E785 Hyperlipidemia, unspecified: Secondary | ICD-10-CM | POA: Diagnosis not present

## 2020-02-10 DIAGNOSIS — I10 Essential (primary) hypertension: Secondary | ICD-10-CM | POA: Diagnosis not present

## 2020-02-10 DIAGNOSIS — K219 Gastro-esophageal reflux disease without esophagitis: Secondary | ICD-10-CM | POA: Diagnosis not present

## 2020-02-10 DIAGNOSIS — D649 Anemia, unspecified: Secondary | ICD-10-CM | POA: Diagnosis not present

## 2020-02-10 DIAGNOSIS — G4733 Obstructive sleep apnea (adult) (pediatric): Secondary | ICD-10-CM | POA: Diagnosis not present

## 2020-02-10 DIAGNOSIS — Z79899 Other long term (current) drug therapy: Secondary | ICD-10-CM | POA: Diagnosis not present

## 2020-02-10 DIAGNOSIS — Z9181 History of falling: Secondary | ICD-10-CM | POA: Diagnosis not present

## 2020-02-10 DIAGNOSIS — Z87440 Personal history of urinary (tract) infections: Secondary | ICD-10-CM | POA: Diagnosis not present

## 2020-02-10 DIAGNOSIS — M1990 Unspecified osteoarthritis, unspecified site: Secondary | ICD-10-CM | POA: Diagnosis not present

## 2020-02-10 DIAGNOSIS — H9193 Unspecified hearing loss, bilateral: Secondary | ICD-10-CM | POA: Diagnosis not present

## 2020-02-10 DIAGNOSIS — G20 Parkinson's disease: Secondary | ICD-10-CM | POA: Diagnosis not present

## 2020-02-10 DIAGNOSIS — Z9842 Cataract extraction status, left eye: Secondary | ICD-10-CM | POA: Diagnosis not present

## 2020-02-10 DIAGNOSIS — K5900 Constipation, unspecified: Secondary | ICD-10-CM | POA: Diagnosis not present

## 2020-02-11 ENCOUNTER — Other Ambulatory Visit: Payer: Self-pay | Admitting: Adult Health

## 2020-02-11 DIAGNOSIS — I10 Essential (primary) hypertension: Secondary | ICD-10-CM

## 2020-02-11 DIAGNOSIS — F32A Depression, unspecified: Secondary | ICD-10-CM | POA: Diagnosis not present

## 2020-02-11 DIAGNOSIS — G14 Postpolio syndrome: Secondary | ICD-10-CM | POA: Diagnosis not present

## 2020-02-11 DIAGNOSIS — J45909 Unspecified asthma, uncomplicated: Secondary | ICD-10-CM | POA: Diagnosis not present

## 2020-02-11 DIAGNOSIS — K219 Gastro-esophageal reflux disease without esophagitis: Secondary | ICD-10-CM | POA: Diagnosis not present

## 2020-02-11 DIAGNOSIS — G20 Parkinson's disease: Secondary | ICD-10-CM | POA: Diagnosis not present

## 2020-02-22 DIAGNOSIS — G20 Parkinson's disease: Secondary | ICD-10-CM | POA: Diagnosis not present

## 2020-02-22 DIAGNOSIS — F32A Depression, unspecified: Secondary | ICD-10-CM | POA: Diagnosis not present

## 2020-02-22 DIAGNOSIS — I10 Essential (primary) hypertension: Secondary | ICD-10-CM | POA: Diagnosis not present

## 2020-02-22 DIAGNOSIS — J45909 Unspecified asthma, uncomplicated: Secondary | ICD-10-CM | POA: Diagnosis not present

## 2020-02-22 DIAGNOSIS — K219 Gastro-esophageal reflux disease without esophagitis: Secondary | ICD-10-CM | POA: Diagnosis not present

## 2020-02-22 DIAGNOSIS — G14 Postpolio syndrome: Secondary | ICD-10-CM | POA: Diagnosis not present

## 2020-02-23 DIAGNOSIS — G14 Postpolio syndrome: Secondary | ICD-10-CM | POA: Diagnosis not present

## 2020-02-23 DIAGNOSIS — F32A Depression, unspecified: Secondary | ICD-10-CM | POA: Diagnosis not present

## 2020-02-23 DIAGNOSIS — K219 Gastro-esophageal reflux disease without esophagitis: Secondary | ICD-10-CM | POA: Diagnosis not present

## 2020-02-23 DIAGNOSIS — J45909 Unspecified asthma, uncomplicated: Secondary | ICD-10-CM | POA: Diagnosis not present

## 2020-02-23 DIAGNOSIS — G20 Parkinson's disease: Secondary | ICD-10-CM | POA: Diagnosis not present

## 2020-02-23 DIAGNOSIS — I10 Essential (primary) hypertension: Secondary | ICD-10-CM | POA: Diagnosis not present

## 2020-02-28 ENCOUNTER — Other Ambulatory Visit: Payer: Self-pay

## 2020-02-28 DIAGNOSIS — G20 Parkinson's disease: Secondary | ICD-10-CM | POA: Diagnosis not present

## 2020-02-28 MED ORDER — BUPROPION HCL ER (XL) 150 MG PO TB24: 150.0000 mg | ORAL_TABLET | ORAL | 1 refills | Status: DC

## 2020-03-06 DIAGNOSIS — K219 Gastro-esophageal reflux disease without esophagitis: Secondary | ICD-10-CM | POA: Diagnosis not present

## 2020-03-06 DIAGNOSIS — I10 Essential (primary) hypertension: Secondary | ICD-10-CM | POA: Diagnosis not present

## 2020-03-06 DIAGNOSIS — G14 Postpolio syndrome: Secondary | ICD-10-CM | POA: Diagnosis not present

## 2020-03-06 DIAGNOSIS — G20 Parkinson's disease: Secondary | ICD-10-CM | POA: Diagnosis not present

## 2020-03-06 DIAGNOSIS — F32A Depression, unspecified: Secondary | ICD-10-CM | POA: Diagnosis not present

## 2020-03-06 DIAGNOSIS — J45909 Unspecified asthma, uncomplicated: Secondary | ICD-10-CM | POA: Diagnosis not present

## 2020-03-12 ENCOUNTER — Other Ambulatory Visit: Payer: Self-pay | Admitting: Nurse Practitioner

## 2020-03-13 ENCOUNTER — Other Ambulatory Visit: Payer: Self-pay

## 2020-03-13 MED ORDER — SIMVASTATIN 20 MG PO TABS: ORAL_TABLET | ORAL | 1 refills | Status: DC

## 2020-03-13 NOTE — Telephone Encounter (Signed)
Good Morning I have seen that multiple medications have been forwarded to me, I work part time, it can delay patient care and pt might be without life saving medications, please discuss proper protocol with your provider Leretha Pol) so your practice can direct any request from [employer]. Thanks

## 2020-03-13 NOTE — Telephone Encounter (Signed)
Sees taylor 03/2020

## 2020-03-14 ENCOUNTER — Other Ambulatory Visit: Payer: Self-pay | Admitting: Nurse Practitioner

## 2020-03-18 ENCOUNTER — Other Ambulatory Visit: Payer: Self-pay | Admitting: Nurse Practitioner

## 2020-03-18 ENCOUNTER — Other Ambulatory Visit: Payer: Self-pay | Admitting: Internal Medicine

## 2020-03-18 DIAGNOSIS — I10 Essential (primary) hypertension: Secondary | ICD-10-CM

## 2020-03-20 ENCOUNTER — Other Ambulatory Visit: Payer: Self-pay

## 2020-03-20 MED ORDER — PANTOPRAZOLE SODIUM 40 MG PO TBEC: 40.0000 mg | DELAYED_RELEASE_TABLET | Freq: Two times a day (BID) | ORAL | 1 refills | Status: DC

## 2020-03-28 ENCOUNTER — Other Ambulatory Visit: Payer: Self-pay | Admitting: Internal Medicine

## 2020-03-28 DIAGNOSIS — Z1231 Encounter for screening mammogram for malignant neoplasm of breast: Secondary | ICD-10-CM

## 2020-04-07 ENCOUNTER — Ambulatory Visit: Payer: Medicare Other | Admitting: Hospice and Palliative Medicine

## 2020-04-18 ENCOUNTER — Ambulatory Visit: Payer: Medicare Other | Admitting: Hospice and Palliative Medicine

## 2020-04-19 ENCOUNTER — Ambulatory Visit
Admission: RE | Admit: 2020-04-19 | Discharge: 2020-04-19 | Disposition: A | Discharge status: Home / Self Care | Payer: Medicare Other | Source: Ambulatory Visit | Attending: Internal Medicine | Admitting: Internal Medicine

## 2020-04-19 ENCOUNTER — Other Ambulatory Visit: Payer: Self-pay

## 2020-04-19 DIAGNOSIS — Z1231 Encounter for screening mammogram for malignant neoplasm of breast: Secondary | ICD-10-CM | POA: Insufficient documentation

## 2020-04-22 DIAGNOSIS — Z23 Encounter for immunization: Secondary | ICD-10-CM | POA: Diagnosis not present

## 2020-04-27 ENCOUNTER — Ambulatory Visit: Payer: Medicare Other | Admitting: Internal Medicine

## 2020-04-28 ENCOUNTER — Telehealth: Payer: Self-pay

## 2020-04-28 NOTE — Telephone Encounter (Signed)
Disability parking placard signed by provider and mailed to [place], [phone], per patient request. Loma Sousa

## 2020-05-02 ENCOUNTER — Ambulatory Visit (INDEPENDENT_AMBULATORY_CARE_PROVIDER_SITE_OTHER): Payer: Medicare Other | Admitting: Hospice and Palliative Medicine

## 2020-05-02 ENCOUNTER — Other Ambulatory Visit: Payer: Self-pay

## 2020-05-02 ENCOUNTER — Encounter: Payer: Self-pay | Admitting: Hospice and Palliative Medicine

## 2020-05-02 VITALS — BP 132/74 | HR 80 | Temp 97.4°F | Resp 16 | Ht 63.0 in | Wt 149.6 lb

## 2020-05-02 DIAGNOSIS — B91 Sequelae of poliomyelitis: Secondary | ICD-10-CM

## 2020-05-02 DIAGNOSIS — F411 Generalized anxiety disorder: Secondary | ICD-10-CM

## 2020-05-02 DIAGNOSIS — I10 Essential (primary) hypertension: Secondary | ICD-10-CM | POA: Diagnosis not present

## 2020-05-02 DIAGNOSIS — M6281 Muscle weakness (generalized): Secondary | ICD-10-CM

## 2020-05-02 DIAGNOSIS — G20A1 Parkinson's disease without dyskinesia, without mention of fluctuations: Secondary | ICD-10-CM

## 2020-05-02 DIAGNOSIS — G20 Parkinson's disease: Secondary | ICD-10-CM | POA: Diagnosis not present

## 2020-05-02 MED ORDER — ESCITALOPRAM OXALATE 20 MG PO TABS: 20.0000 mg | ORAL_TABLET | Freq: Every day | ORAL | 0 refills | Status: DC

## 2020-05-02 NOTE — Progress Notes (Signed)
[facility] [place], [place] [address]  Internal MEDICINE  Office Visit Note  Patient Name: Susan Shah  [id number]  [id number]  Date of Service: 05/04/2020  Chief Complaint  Patient presents with  . Follow-up  . Depression  . Gastroesophageal Reflux  . Sleep Apnea  . Hypertension  . Hyperlipidemia  . Anxiety  . Asthma    HPI Patient is here for routine follow-up Accompanied today by her husband whom is here primary caretaker Both are concerned about her increased levels of anxiety, feels anxious most days Unable to tolerate her CPAP at this time as she feels she is suffocating, has worked with [employer]--DME representative on trying many different styles and sizes of masks Feels this is also being triggered by increased levels of anxiety Has been on current dosing of Wellbutrin and Lexapro for several years  Recent labs reviewed, all stable  Current Medication: Outpatient Encounter Medications as of 05/02/2020  Medication Sig  . escitalopram (LEXAPRO) 20 MG tablet Take 1 tablet (20 mg total) by mouth daily.  Marland Kitchen acetaminophen (TYLENOL) 500 MG tablet Take 1,000 mg by mouth every 6 (six) hours as needed for moderate pain or headache.  Marland Kitchen amLODipine (NORVASC) 2.5 MG tablet TAKE 1 TABLET BY MOUTH EVERY DAY - STOP LISINOPRIL  . buPROPion (WELLBUTRIN XL) 150 MG 24 hr tablet Take 1 tablet (150 mg total) by mouth every morning.  . carbidopa-levodopa (SINEMET) 25-100 MG tablet Take one tab po qpm  . Carboxymethylcellul-Glycerin (LUBRICATING EYE DROPS OP) Place 1 drop into both eyes daily as needed (dry eyes).  . cholecalciferol (VITAMIN D3) 25 MCG (1000 UNIT) tablet Take 1,000 Units by mouth daily.  . fluticasone (FLONASE) 50 MCG/ACT nasal spray Place 1 spray into both nostrils at bedtime as needed for allergies or rhinitis.  Marland Kitchen HYDROcodone-acetaminophen (NORCO) 5-325 MG tablet Take 1 tablet by mouth every 4 (four) hours as needed for moderate pain.  Marland Kitchen  loratadine (QC LORATADINE ALLERGY RELIEF) 10 MG tablet Take 1 tablet (10 mg total) by mouth daily.  Marland Kitchen losartan (COZAAR) 100 MG tablet Take 1 tablet (100 mg total) by mouth daily.  . metoprolol tartrate (LOPRESSOR) 25 MG tablet TAKE 1 TABLET (25 MG TOTAL) BY MOUTH 2 (TWO) TIMES DAILY WITH A MEAL.  . NON FORMULARY cpap device  . nystatin (MYCOSTATIN) 100000 UNIT/ML suspension Take 5 mLs (500,000 Units total) by mouth 4 (four) times daily.  . ondansetron (ZOFRAN-ODT) 4 MG disintegrating tablet Take 1 tablet (4 mg total) by mouth every 8 (eight) hours as needed for nausea or vomiting.  Marland Kitchen oxybutynin (DITROPAN-XL) 10 MG 24 hr tablet TAKE 2 TABLETS BY MOUTH EVERY DAY  . pantoprazole (PROTONIX) 40 MG tablet Take 1 tablet (40 mg total) by mouth 2 (two) times daily.  . potassium chloride SA (KLOR-CON) 20 MEQ tablet Take 1 tablet (20 mEq total) by mouth daily.  . ranitidine (ZANTAC) 150 MG capsule Take 1 capsule (150 mg total) by mouth 2 (two) times daily.  Marland Kitchen rOPINIRole (REQUIP) 0.25 MG tablet Take 1 tablet (0.25 mg total) by mouth 3 (three) times daily.  . simvastatin (ZOCOR) 20 MG tablet TAKE 1 TABLET BY MOUTH DAILY AT 6 PM.  . tiZANidine (ZANAFLEX) 2 MG tablet Take 1 tablet (2 mg total) by mouth 2 (two) times daily as needed for muscle spasms.  . vitamin B-12 (CYANOCOBALAMIN) 1000 MCG tablet Take 1,000 mcg by mouth daily.  . [DISCONTINUED] escitalopram (LEXAPRO) 10 MG tablet Take 1 tablet (10 mg  total) by mouth daily.   No facility-administered encounter medications on file as of 05/02/2020.    Surgical History: Past Surgical History:  Procedure Laterality Date  . [place] STUDY N/A 04/24/2016   Procedure: 59 HOUR PH STUDY;  Surgeon: Lucilla Lame, MD;  Location: [facility] ENDOSCOPY;  Service: Endoscopy;  Laterality: N/A;  . ABDOMINAL HYSTERECTOMY     Total  . APPENDECTOMY    . CARPOMETACARPAL ([place]) FUSION OF THUMB Left 05/16/2016   Procedure: CARPOMETACARPAL [facility]) FUSION OF THUMB;  Surgeon: Hessie Knows,  MD;  Location: [facility] ORS;  Service: Orthopedics;  Laterality: Left;  . CARPOMETACARPAL ([place]) FUSION OF THUMB Left 03/30/2019   Procedure: LEFT THUMB SUSPENSION PLASTY;  Surgeon: Hessie Knows, MD;  Location: [facility] ORS;  Service: Orthopedics;  Laterality: Left;  . CATARACT EXTRACTION Bilateral 12/2012  . CHOLECYSTECTOMY    . COLONOSCOPY  2003  . ESOPHAGEAL MANOMETRY N/A 04/24/2016   Procedure: ESOPHAGEAL MANOMETRY (EM);  Surgeon: Lucilla Lame, MD;  Location: [facility] ENDOSCOPY;  Service: Endoscopy;  Laterality: N/A;  . ESOPHAGOGASTRODUODENOSCOPY  08/2011  . FOOT SURGERY    . HALLUX VALGUS CORRECTION    . HIP SURGERY Right 1950   tendon release r/t polio  . KNEE ARTHROSCOPY Left 06/23/2008  . RETINAL DETACHMENT SURGERY Right 03/2014  . ROTATOR CUFF REPAIR Bilateral   . URINARY SURGERY  2014   [place]    Medical History: Past Medical History:  Diagnosis Date  . Anxiety   . Asthma 2015   mild, seasonal allergy triggered.  . Cancer ([place]) 2013   skin cancer  on left hand  . Depression   . Esophageal dysmotility   . GERD (gastroesophageal reflux disease) 11/05/2012  . History of hiatal hernia   . Hyperlipidemia   . Hypertension   . Incontinence of urine   . Lung nodule   . OA (osteoarthritis)   . Obesity   . Parkinson's disease ([place])   . PONV (postoperative nausea and vomiting)   . Post-polio syndrome    contracted at 99 months old  . Pulmonary nodule 11/05/2012   RML  . Shingles 2009  . Sleep apnea     Family History: Family History  Problem Relation Age of Onset  . Heart disease Mother   . Heart disease Father   . Heart disease Brother   . Stroke Maternal Grandmother   . Heart disease Maternal Grandfather   . Heart attack Brother   . Breast cancer Maternal Aunt     Social History   Socioeconomic History  . Marital status: Married    Spouse name: Not on file  . Number of children: Not on file  . Years of education: Not on file  . Highest education level: Not on  file  Occupational History  . Not on file  Tobacco Use  . Smoking status: Former Smoker    Quit date: 05/09/1968    Years since quitting: 52.0  . Smokeless tobacco: Never Used  Vaping Use  . Vaping Use: Never used  Substance and Sexual Activity  . Alcohol use: No  . Drug use: No  . Sexual activity: Not on file  Other Topics Concern  . Not on file  Social History Narrative  . Not on file   Social Determinants of Health   Financial Resource Strain: Not on file  Food Insecurity: Not on file  Transportation Needs: Not on file  Physical Activity: Not on file  Stress: Not on file  Social Connections: Not on  file  Intimate Partner Violence: Not on file      Review of Systems  Constitutional: Negative for chills, diaphoresis and fatigue.  HENT: Negative for ear pain, postnasal drip and sinus pressure.   Eyes: Negative for photophobia, discharge, redness, itching and visual disturbance.  Respiratory: Negative for cough, shortness of breath and wheezing.   Cardiovascular: Negative for chest pain, palpitations and leg swelling.  Gastrointestinal: Negative for abdominal pain, constipation, diarrhea, nausea and vomiting.  Genitourinary: Negative for dysuria and flank pain.  Musculoskeletal: Negative for arthralgias, back pain, gait problem and neck pain.  Skin: Negative for color change.  Allergic/Immunologic: Negative for environmental allergies and food allergies.  Neurological: Negative for dizziness and headaches.  Hematological: Does not bruise/bleed easily.  Psychiatric/Behavioral: Negative for agitation, behavioral problems (depression) and hallucinations. The patient is nervous/anxious.     Vital Signs: BP 132/74   Pulse 80   Temp (!) 97.4 F (36.3 C)   Resp 16   Ht 5\' 3"  (1.6 m)   Wt 149 lb 10.1 oz (67.9 kg)   SpO2 97%   BMI 26.51 kg/m    Physical Exam Vitals reviewed.  Constitutional:      Appearance: Normal appearance. She is normal weight.   Cardiovascular:     Rate and Rhythm: Normal rate and regular rhythm.     Pulses: Normal pulses.     Heart sounds: Normal heart sounds.  Pulmonary:     Effort: Pulmonary effort is normal.     Breath sounds: Normal breath sounds.  Abdominal:     General: Abdomen is flat.     Palpations: Abdomen is soft.  Musculoskeletal:        General: Normal range of motion.     Cervical back: Normal range of motion.  Skin:    General: Skin is warm.  Neurological:     General: No focal deficit present.     Mental Status: She is alert and oriented to person, place, and time. Mental status is at baseline.  Psychiatric:        Mood and Affect: Mood normal.        Behavior: Behavior normal.        Thought Content: Thought content normal.        Judgment: Judgment normal.    Assessment/Plan: 1. Essential hypertension BP and HR well controlled on present management, continue to monitor  2. Parkinson disease ([place]) Remains stable, followed by neurology  3. Post-polio muscle weakness Remains stable  4. GAD (generalized anxiety disorder) Increase dosing of Lexapro for uncontrolled anxiety, follow-up closely in 6 weeks Update labs if anxiety remains uncontrolled at follow-up - escitalopram (LEXAPRO) 20 MG tablet; Take 1 tablet (20 mg total) by mouth daily.  Dispense: 90 tablet; Refill: 0  General Counseling: Susan Shah verbalizes understanding of the findings of todays visit and agrees with plan of treatment. I have discussed any further diagnostic evaluation that may be needed or ordered today. We also reviewed her medications today. she has been encouraged to call the office with any questions or concerns that should arise related to todays visit.  Meds ordered this encounter  Medications  . escitalopram (LEXAPRO) 20 MG tablet    Sig: Take 1 tablet (20 mg total) by mouth daily.    Dispense:  90 tablet    Refill:  0    Time spent: 30 Minutes Time spent includes review of chart, medications, test  results and follow-up plan with the patient.  This patient was seen by  Theodoro Grist AGNP-C in Collaboration with Dr Lavera Guise as a part of collaborative care agreement     Tanna Furry. Shamiya Demeritt AGNP-C Internal medicine

## 2020-05-04 ENCOUNTER — Encounter: Payer: Self-pay | Admitting: Hospice and Palliative Medicine

## 2020-06-01 ENCOUNTER — Other Ambulatory Visit: Payer: Self-pay

## 2020-06-01 ENCOUNTER — Encounter: Payer: Self-pay | Admitting: Nurse Practitioner

## 2020-06-01 ENCOUNTER — Ambulatory Visit (INDEPENDENT_AMBULATORY_CARE_PROVIDER_SITE_OTHER): Payer: Medicare Other | Admitting: Nurse Practitioner

## 2020-06-01 DIAGNOSIS — G20 Parkinson's disease: Secondary | ICD-10-CM

## 2020-06-01 DIAGNOSIS — G4733 Obstructive sleep apnea (adult) (pediatric): Secondary | ICD-10-CM

## 2020-06-01 DIAGNOSIS — Z9989 Dependence on other enabling machines and devices: Secondary | ICD-10-CM

## 2020-06-01 DIAGNOSIS — I10 Essential (primary) hypertension: Secondary | ICD-10-CM | POA: Diagnosis not present

## 2020-06-01 NOTE — Progress Notes (Signed)
[facility] [place], [place] [address]  Internal MEDICINE  Office Visit Note  Patient Name: Susan Shah  [id number]  [id number]  Date of Service: 06/02/2020  Chief Complaint  Patient presents with  . Follow-up  . Asthma  . Sleep Apnea    HPI Pt presents for Follow up visit regarding asthma and OSA Need new sleep study, no more snoring, does not wake up feeling tired. -reports no wheezing, SOB, cough, no acute asthma attacks.  -Has lost about 40 lbs, cpap mask no longer fits well.     Current Medication: Outpatient Encounter Medications as of 06/01/2020  Medication Sig  . acetaminophen (TYLENOL) 500 MG tablet Take 1,000 mg by mouth every 6 (six) hours as needed for moderate pain or headache.  Marland Kitchen amLODipine (NORVASC) 2.5 MG tablet TAKE 1 TABLET BY MOUTH EVERY DAY - STOP LISINOPRIL  . buPROPion (WELLBUTRIN XL) 150 MG 24 hr tablet Take 1 tablet (150 mg total) by mouth every morning.  . carbidopa-levodopa (SINEMET) 25-100 MG tablet Take one tab po qpm  . Carboxymethylcellul-Glycerin (LUBRICATING EYE DROPS OP) Place 1 drop into both eyes daily as needed (dry eyes).  . cholecalciferol (VITAMIN D3) 25 MCG (1000 UNIT) tablet Take 1,000 Units by mouth daily.  Marland Kitchen escitalopram (LEXAPRO) 20 MG tablet Take 1 tablet (20 mg total) by mouth daily.  . fluticasone (FLONASE) 50 MCG/ACT nasal spray Place 1 spray into both nostrils at bedtime as needed for allergies or rhinitis.  Marland Kitchen HYDROcodone-acetaminophen (NORCO) 5-325 MG tablet Take 1 tablet by mouth every 4 (four) hours as needed for moderate pain.  Marland Kitchen loratadine (QC LORATADINE ALLERGY RELIEF) 10 MG tablet Take 1 tablet (10 mg total) by mouth daily.  Marland Kitchen losartan (COZAAR) 100 MG tablet Take 1 tablet (100 mg total) by mouth daily.  . metoprolol tartrate (LOPRESSOR) 25 MG tablet TAKE 1 TABLET (25 MG TOTAL) BY MOUTH 2 (TWO) TIMES DAILY WITH A MEAL.  . NON FORMULARY cpap device  . nystatin (MYCOSTATIN) 100000 UNIT/ML  suspension Take 5 mLs (500,000 Units total) by mouth 4 (four) times daily.  . ondansetron (ZOFRAN-ODT) 4 MG disintegrating tablet Take 1 tablet (4 mg total) by mouth every 8 (eight) hours as needed for nausea or vomiting.  Marland Kitchen oxybutynin (DITROPAN-XL) 10 MG 24 hr tablet TAKE 2 TABLETS BY MOUTH EVERY DAY  . pantoprazole (PROTONIX) 40 MG tablet Take 1 tablet (40 mg total) by mouth 2 (two) times daily.  . potassium chloride SA (KLOR-CON) 20 MEQ tablet Take 1 tablet (20 mEq total) by mouth daily.  . ranitidine (ZANTAC) 150 MG capsule Take 1 capsule (150 mg total) by mouth 2 (two) times daily.  Marland Kitchen rOPINIRole (REQUIP) 0.25 MG tablet Take 1 tablet (0.25 mg total) by mouth 3 (three) times daily.  . simvastatin (ZOCOR) 20 MG tablet TAKE 1 TABLET BY MOUTH DAILY AT 6 PM.  . tiZANidine (ZANAFLEX) 2 MG tablet Take 1 tablet (2 mg total) by mouth 2 (two) times daily as needed for muscle spasms.  . vitamin B-12 (CYANOCOBALAMIN) 1000 MCG tablet Take 1,000 mcg by mouth daily.  . [DISCONTINUED] losartan (COZAAR) 100 MG tablet Take 1 tablet (100 mg total) by mouth daily.   No facility-administered encounter medications on file as of 06/01/2020.    Surgical History: Past Surgical History:  Procedure Laterality Date  . [place] STUDY N/A 04/24/2016   Procedure: 92 HOUR PH STUDY;  Surgeon: Lucilla Lame, MD;  Location: [facility] END[facility]COPY;  Service: Endoscopy;  Laterality: N/A;  . ABDOMINAL HYSTERECTOMY     Total  . APPENDECTOMY    . CARPOMETACARPAL ([place]) FUSION OF THUMB Left 05/16/2016   Procedure: CARPOMETACARPAL [facility]) FUSION OF THUMB;  Surgeon: Hessie Knows, MD;  Location: [facility] ORS;  Service: Orthopedics;  Laterality: Left;  . CARPOMETACARPAL ([place]) FUSION OF THUMB Left 03/30/2019   Procedure: LEFT THUMB SUSPENSION PLASTY;  Surgeon: Hessie Knows, MD;  Location: [facility] ORS;  Service: Orthopedics;  Laterality: Left;  . CATARACT EXTRACTION Bilateral 12/2012  . CHOLECYSTECTOMY    . COLONOSCOPY  2003  . ESOPHAGEAL MANOMETRY  N/A 04/24/2016   Procedure: ESOPHAGEAL MANOMETRY (EM);  Surgeon: Lucilla Lame, MD;  Location: [facility] ENDOSCOPY;  Service: Endoscopy;  Laterality: N/A;  . ESOPHAGOGASTRODUODENOSCOPY  08/2011  . FOOT SURGERY    . HALLUX VALGUS CORRECTION    . HIP SURGERY Right 1950   tendon release r/t polio  . KNEE ARTHROSCOPY Left 06/23/2008  . RETINAL DETACHMENT SURGERY Right 03/2014  . ROTATOR CUFF REPAIR Bilateral   . URINARY SURGERY  2014   [place]    Medical History: Past Medical History:  Diagnosis Date  . Anxiety   . Asthma 2015   mild, seasonal allergy triggered.  . Cancer ([place]) 2013   skin cancer  on left hand  . Depression   . Esophageal dysmotility   . GERD (gastroesophageal reflux disease) 11/05/2012  . History of hiatal hernia   . Hyperlipidemia   . Hypertension   . Incontinence of urine   . Lung nodule   . OA (osteoarthritis)   . Obesity   . Parkinson's disease ([place])   . PONV (postoperative nausea and vomiting)   . Post-polio syndrome    contracted at 8 months old  . Pulmonary nodule 11/05/2012   RML  . Shingles 2009  . Sleep apnea     Family History: Family History  Problem Relation Age of Onset  . Heart disease Mother   . Heart disease Father   . Heart disease Brother   . Stroke Maternal Grandmother   . Heart disease Maternal Grandfather   . Heart attack Brother   . Breast cancer Maternal Aunt     Social History   Socioeconomic History  . Marital status: Married    Spouse name: Not on file  . Number of children: Not on file  . Years of education: Not on file  . Highest education level: Not on file  Occupational History  . Not on file  Tobacco Use  . Smoking status: Former Smoker    Quit date: 05/09/1968    Years since quitting: 52.1  . Smokeless tobacco: Never Used  Vaping Use  . Vaping Use: Never used  Substance and Sexual Activity  . Alcohol use: No  . Drug use: No  . Sexual activity: Not on file  Other Topics Concern  . Not on file  Social  History Narrative  . Not on file   Social Determinants of Health   Financial Resource Strain: Not on file  Food Insecurity: Not on file  Transportation Needs: Not on file  Physical Activity: Not on file  Stress: Not on file  Social Connections: Not on file  Intimate Partner Violence: Not on file      Review of Systems  Constitutional: Negative.   Respiratory: Negative.   Cardiovascular: Negative.   Gastrointestinal: Negative.   Genitourinary: Negative.   Neurological: Negative.   Psychiatric/Behavioral: Negative.     Vital Signs: BP (!) 145/70   Pulse  72   Temp (!) 97.4 F (36.3 C)   Resp 16   Ht 5\' 3"  (1.6 m)   Wt 151 lb 9.6 oz (68.8 kg)   SpO2 97%   BMI 26.85 kg/m    Physical Exam Constitutional:      Appearance: Normal appearance.  HENT:     Head: Normocephalic and atraumatic.  Cardiovascular:     Rate and Rhythm: Normal rate and regular rhythm.     Pulses: Normal pulses.     Heart sounds: Normal heart sounds.  Pulmonary:     Effort: Pulmonary effort is normal.     Breath sounds: Normal breath sounds.  Skin:    General: Skin is warm and dry.     Capillary Refill: Capillary refill takes less than 2 seconds.  Neurological:     Mental Status: She is alert and oriented to person, place, and time.     Motor: Weakness, tremor and atrophy present.    Assessment/Plan: 1. OSA on CPAP Susan Shah has lost a significant amount of weight. She is no longer having breathing problems, snoring, night awakenings, and her CPAP mask is not fitting correctly. Another sleep study is necessary to determine if cpap is still needed.  - PSG Sleep Study; Future  2. Essential hypertension Blood pressure is under control on the current medication regimen, no changes. Susan Shah needs a refill on losartan.  losartan (COZAAR) 100 MG tablet   Sig: Take 1 tablet (100 mg total) by mouth daily.   Dispense:  90 tablet   Refill:  1   3. Parkinson disease ([place]) Susan Shah has parkinson disease  which is managed by neurology. She is on medications to manage symptoms and slow progression. The patient has a significant tremor today and is constantly moving in her seat. This was discussed with Susan Shah and her husband and they stated that the neurologist recently decreased the medication and has been adjusting it to find the optimal dose to control the symptoms.     General Counseling: Susan Shah verbalizes understanding of the findings of todays visit and agrees with plan of treatment. I have discussed any further diagnostic evaluation that may be needed or ordered today. We also reviewed her medications today. she has been encouraged to call the office with any questions or concerns that should arise related to todays visit.    Orders Placed This Encounter  Procedures  . PSG Sleep Study    Meds ordered this encounter  Medications  . losartan (COZAAR) 100 MG tablet    Sig: Take 1 tablet (100 mg total) by mouth daily.    Dispense:  90 tablet    Refill:  1   Return for F/U already scheduled for 01/12/21 , Demia Viera PCP.  Total time spent:15 Minutes Time spent includes review of chart, medications, test results, and follow up plan with the patient.   [place] Controlled Substance Database was reviewed by me.   Dr Lavera Guise Internal medicine

## 2020-06-02 MED ORDER — LOSARTAN POTASSIUM 100 MG PO TABS: 100.0000 mg | ORAL_TABLET | Freq: Every day | ORAL | 1 refills | Status: DC

## 2020-06-13 ENCOUNTER — Telehealth: Payer: Self-pay

## 2020-06-13 ENCOUNTER — Ambulatory Visit: Payer: Medicare Other | Admitting: Internal Medicine

## 2020-06-16 DIAGNOSIS — G249 Dystonia, unspecified: Secondary | ICD-10-CM | POA: Diagnosis not present

## 2020-06-16 DIAGNOSIS — G14 Postpolio syndrome: Secondary | ICD-10-CM | POA: Diagnosis not present

## 2020-06-16 DIAGNOSIS — G20 Parkinson's disease: Secondary | ICD-10-CM | POA: Diagnosis not present

## 2020-06-23 ENCOUNTER — Encounter (INDEPENDENT_AMBULATORY_CARE_PROVIDER_SITE_OTHER): Payer: Medicare Other | Admitting: Internal Medicine

## 2020-06-23 DIAGNOSIS — G4719 Other hypersomnia: Secondary | ICD-10-CM

## 2020-06-23 DIAGNOSIS — G4710 Hypersomnia, unspecified: Secondary | ICD-10-CM | POA: Diagnosis not present

## 2020-06-27 NOTE — Telephone Encounter (Signed)
FG stated: We have this patient scheduled on Friday June 23, 2020 for a PSG stduy.

## 2020-07-04 DIAGNOSIS — G4719 Other hypersomnia: Secondary | ICD-10-CM | POA: Insufficient documentation

## 2020-07-04 NOTE — Procedures (Signed)
[place] Report Part I                                                                 Phone: [phone] Fax: [phone]  Patient Name: Susan Shah, Susan Shah Acquisition Number: [id number]  Date of Birth: [birth year] Acquisition Date: 06/23/2020  Referring Physician: Clayborn Bigness, MD     History: The patient is a 75 year old female who was referred for re-evaluation of obstructive sleep apnea. Medical History: Parkinson's disease, essential hyperension, OSA, post-polio syndrome, asthma  Medications: amlodipine, bupropion, carbidopa-levodopa, carboxymethylcellul-glycerin, cholecalciferol, vitamin  B-12, escitalopram, fluticasone, hydrocodone-acetaminophen, loratadine, losartan, metroprolol, zantac, zanaflex, Requip, Protonix, Ditropan, Zofran, mycostatin, lopressor.   Procedure: This routine overnight polysomnogram was performed on the [facility] 5 using the standard diagnostic protocol. This included 6 channels of EEG, 2 channels of EOG, chin EMG, bilateral anterior tibialis EMG, nasal/oral thermistor, PTAF (nasal pressure transducer), chest and abdominal wall movements, EKG, and pulse oximetry.  Description: The total recording time was 454.1 minutes. The total sleep time was 387.5 minutes. There were a total of 36.3 minutes of wakefulness after sleep onset for a reducedsleep efficiency of 85.3%. The latency to sleep onset was slightly prolonged at 30.3 minutes. The R sleep onset latency wasprolonged at 205.5 minutes. Sleep parameters, as a percentage of the total sleep time, demonstrated 0.1% of sleep was in N1 sleep, 90.3% N2, 0.4% N3 and 9.2% R sleep. There were a total of 226 arousals for an arousal index of 35.0 arousals per hour of sleep that was elevated.  Respiratory monitoring demonstrated occasional mild degree of snoring in all positions. There were 10 apneas and hypopneas for an Apnea Hypopnea Index of 1.5 apneas and hypopneas per hour of sleep. The REM related  apnea hypopnea index was 0.0/hr of REM sleep compared to a NREM AHI of 1.7/hr.  The average duration of the respiratory events was 19.9 seconds with a maximum duration of 30.0 seconds. The respiratory events occurred in all positions. The respiratory events were associated with peripheral oxygen desaturations on the average to 92%. The lowest oxygen desaturation associated with a respiratory event was 90%. Additionally, the baseline oxygen saturation during wakefulness was 94%, during NREM sleep averaged 94%, and during REM sleep averaged 95%. The total duration of oxygen < 90% was 6.5 minutes and <80% was 0.0 minutes.  Cardiac monitoring- There were no significant cardiac rhythm irregularities.   Periodic limb movement monitoring- demonstrated that there were 187 periodic limb movements for a periodic limb movement index of 29.0 periodic limb movements per hour of sleep.   Impression: This routine overnight polysomnogram did not demonstrate significant obstructive sleep apnea due to a low Apnea Hypopnea Index of 1.5 apneas and hypopneas per hour. The patient has lost significant weight, however she reported using CPAP nightly up until the study. This may have caused an underestimation of the severity of the sleep apnea.  There was a significantly elevated periodic limb movement index of 29.0 periodic limb movements per hour of sleep. Clinical correlation is suggested.  There was a reduced sleep efficiency with an elevated arousal index and reduced percentages of REM and slow wave sleep.These findings would appear to be due to the combination of increased upper airway resistance and periodic  limb movements.   Recommendations:     Would recommend a repeat study after a minimum of 4 nights off of CPAP.    [name], MD, [facility] Diplomate ABMS-Pulmonary, Critical Care and Sleep Medicine  Electronically reviewed and digitally signed     [place] Report Part  II  Phone: [phone] Fax: [phone]  Patient last name [place] Neck Size 14.0 in. Acquisition [id number]  Patient first name Charmine Weight 145.0 lbs. Started 06/23/2020 at 10:08:58 PM  Birth date 06-10-1945 Height 63.0 in. Stopped 06/24/2020 at 5:57:28 AM  Age 25 BMI 25.7 lb/in2 Duration 454.1  Study Type Adult      Report generated by: Chilton Si, RRT, RCP Sleep Data: Lights Out: 10:19:10 PM Sleep Onset: 10:49:28 PM  Lights On: 5:53:16 AM Sleep Efficiency: 85.3 %  Total Recording Time: 454.1 min Sleep Latency (from Lights Off) 30.3 min  Total Sleep Time (TST): 387.5 min R Latency (from Sleep Onset): 205.5 min  Sleep Period Time: 423.5 min Total number of awakenings: 15  Wake during sleep: 36.0 min Wake After Sleep Onset (WASO): 36.3 min   Sleep Data:         Arousal Summary: Stage  Latency from lights out (min) Latency from sleep onset (min) Duration (min) % Total Sleep Time  Normal values  N 1 38.8 8.5 0.5 0.1 (5%)  N 2 30.3 0.0 350.0 90.3 (50%)  N 3 60.8 30.5 1.5 0.4 (20%)  R 235.8 205.5 35.5 9.2 (25%)    Number Index  Spontaneous 153 23.7  Apneas & Hypopneas 5 0.8  RERAs 0 0.0       (Apneas & Hypopneas & RERAs)  (5) (0.8)  Limb Movement 96 14.9  Snore 0 0.0  TOTAL 254 39.3     Respiratory Data:  CA OA MA Apnea Hypopnea* A+ H RERA Total  Number 0 5 0 5 5 10  0 10  Mean Dur (sec) 0.0 14.2 0.0 14.2 25.7 19.9 0.0 19.9  Max Dur (sec) 0.0 17.5 0.0 17.5 30.0 30.0 0.0 30.0  Total Dur (min) 0.0 1.2 0.0 1.2 2.1 3.3 0.0 3.3  % of TST 0.0 0.3 0.0 0.3 0.6 0.9 0.0 0.9  Index (#/h TST) 0.0 0.8 0.0 0.8 0.8 1.5 0.0 1.5  *Hypopneas scored based on 4% or greater desaturation.  Sleep Stage:        REM NREM TST  AHI 0.0 1.7 1.5  RDI 0.0 1.7 1.5           Body Position Data:  Sleep (min) TST (%) REM (min) NREM (min) CA (#) OA (#) MA (#) HYP (#) AHI (#/h) RERA (#) RDI (#/h) Desat (#)  Supine 113.1 29.19 0.0 113.1 0 1 0 3 2.1 0 2.1 22  Non-Supine 274.40 70.81  35.50 238.90 0.00 4.00 0.00 2.00 1.31 0 1.31 34.00  Right: 274.4 70.81 35.5 238.9 0 4 0 2 1.3 0 1.3 34     Snoring: Total number of snoring episodes  0  Total time with snoring    min (   % of sleep)   Oximetry Distribution:             WK REM NREM TOTAL  Average (%)   94 95 94 94  < 90% 0.1 0.0 6.4 6.5  < 80% 0.0 0.0 0.0 0.0  < 70% 0.0 0.0 0.0 0.0  # of Desaturations* 2 2 52 56  Desat Index (#/hour) 1.8 3.4 8.9 8.7  Desat Max (%)  5 7 9 9   Desat Max Dur (sec) 31.0 33.0 67.0 67.0  Approx Min O2 during sleep 85  Approx min O2 during a respiratory event 90  Was Oxygen added (Y/N) and final rate No:   0 LPM  *Desaturations based on 4% or greater drop from baseline.   Cheyne Stokes Breathing: None Present   Heart Rate Summary:  Average Heart Rate During Sleep 72.9 bpm      Highest Heart Rate During Sleep (95th %) 78.0 bpm      Highest Heart Rate During Sleep 176 bpm (artifact)  Highest Heart Rate During Recording (TIB) 191 bpm (artifact)   Heart Rate Observations: Event Type # Events   Bradycardia 0 Lowest HR Scored: N/A  Sinus Tachycardia During Sleep 0 Highest HR Scored: N/A  Narrow Complex Tachycardia 0 Highest HR Scored: N/A  Wide Complex Tachycardia 0 Highest HR Scored: N/A  Asystole 0 Longest Pause: N/A  Atrial Fibrillation 0 Duration Longest Event: N/A  Other Arrythmias  No Type:    Periodic Limb Movement Data: (Primary legs unless otherwise noted) Total # Limb Movement 199 Limb Movement Index 30.8  Total # PLMS 187 PLMS Index 29.0  Total # PLMS Arousals 96 PLMS Arousal Index 14.9  Percentage Sleep Time with PLMS 86.36min (22.2 % sleep)  Mean Duration limb movements (secs) 738.6

## 2020-07-08 ENCOUNTER — Other Ambulatory Visit: Payer: Self-pay | Admitting: Nurse Practitioner

## 2020-07-08 ENCOUNTER — Other Ambulatory Visit: Payer: Self-pay | Admitting: Internal Medicine

## 2020-07-08 DIAGNOSIS — I10 Essential (primary) hypertension: Secondary | ICD-10-CM

## 2020-07-10 ENCOUNTER — Other Ambulatory Visit: Payer: Self-pay

## 2020-07-13 ENCOUNTER — Telehealth: Payer: Self-pay

## 2020-07-13 NOTE — Telephone Encounter (Signed)
Per DFK the patients OSA is resolved due to weight loss and is no longer in need of PSG or CPAP.

## 2020-08-10 DIAGNOSIS — M4312 Spondylolisthesis, cervical region: Secondary | ICD-10-CM | POA: Diagnosis not present

## 2020-08-10 DIAGNOSIS — M5383 Other specified dorsopathies, cervicothoracic region: Secondary | ICD-10-CM | POA: Diagnosis not present

## 2020-08-10 DIAGNOSIS — M9902 Segmental and somatic dysfunction of thoracic region: Secondary | ICD-10-CM | POA: Diagnosis not present

## 2020-08-10 DIAGNOSIS — M9901 Segmental and somatic dysfunction of cervical region: Secondary | ICD-10-CM | POA: Diagnosis not present

## 2020-08-12 ENCOUNTER — Other Ambulatory Visit: Payer: Self-pay | Admitting: Internal Medicine

## 2020-08-12 DIAGNOSIS — F411 Generalized anxiety disorder: Secondary | ICD-10-CM

## 2020-08-14 DIAGNOSIS — M9901 Segmental and somatic dysfunction of cervical region: Secondary | ICD-10-CM | POA: Diagnosis not present

## 2020-08-14 DIAGNOSIS — M4312 Spondylolisthesis, cervical region: Secondary | ICD-10-CM | POA: Diagnosis not present

## 2020-08-14 DIAGNOSIS — M9902 Segmental and somatic dysfunction of thoracic region: Secondary | ICD-10-CM | POA: Diagnosis not present

## 2020-08-14 DIAGNOSIS — M5383 Other specified dorsopathies, cervicothoracic region: Secondary | ICD-10-CM | POA: Diagnosis not present

## 2020-08-21 DIAGNOSIS — M9901 Segmental and somatic dysfunction of cervical region: Secondary | ICD-10-CM | POA: Diagnosis not present

## 2020-08-21 DIAGNOSIS — M5383 Other specified dorsopathies, cervicothoracic region: Secondary | ICD-10-CM | POA: Diagnosis not present

## 2020-08-21 DIAGNOSIS — M4312 Spondylolisthesis, cervical region: Secondary | ICD-10-CM | POA: Diagnosis not present

## 2020-08-21 DIAGNOSIS — M9902 Segmental and somatic dysfunction of thoracic region: Secondary | ICD-10-CM | POA: Diagnosis not present

## 2020-08-23 DIAGNOSIS — M5383 Other specified dorsopathies, cervicothoracic region: Secondary | ICD-10-CM | POA: Diagnosis not present

## 2020-08-23 DIAGNOSIS — M4312 Spondylolisthesis, cervical region: Secondary | ICD-10-CM | POA: Diagnosis not present

## 2020-08-23 DIAGNOSIS — M9902 Segmental and somatic dysfunction of thoracic region: Secondary | ICD-10-CM | POA: Diagnosis not present

## 2020-08-23 DIAGNOSIS — M9901 Segmental and somatic dysfunction of cervical region: Secondary | ICD-10-CM | POA: Diagnosis not present

## 2020-09-01 ENCOUNTER — Other Ambulatory Visit: Payer: Self-pay | Admitting: Nurse Practitioner

## 2020-09-01 ENCOUNTER — Other Ambulatory Visit: Payer: Self-pay | Admitting: Internal Medicine

## 2020-09-01 ENCOUNTER — Other Ambulatory Visit: Payer: Self-pay

## 2020-09-01 DIAGNOSIS — I10 Essential (primary) hypertension: Secondary | ICD-10-CM

## 2020-09-01 MED ORDER — SIMVASTATIN 20 MG PO TABS: ORAL_TABLET | ORAL | 1 refills | Status: DC

## 2020-10-18 DIAGNOSIS — G20 Parkinson's disease: Secondary | ICD-10-CM | POA: Diagnosis not present

## 2020-10-18 DIAGNOSIS — Z9989 Dependence on other enabling machines and devices: Secondary | ICD-10-CM | POA: Diagnosis not present

## 2020-10-18 DIAGNOSIS — G4733 Obstructive sleep apnea (adult) (pediatric): Secondary | ICD-10-CM | POA: Diagnosis not present

## 2020-10-18 DIAGNOSIS — R29898 Other symptoms and signs involving the musculoskeletal system: Secondary | ICD-10-CM | POA: Diagnosis not present

## 2020-10-18 DIAGNOSIS — R42 Dizziness and giddiness: Secondary | ICD-10-CM | POA: Diagnosis not present

## 2020-10-31 DIAGNOSIS — Z23 Encounter for immunization: Secondary | ICD-10-CM | POA: Diagnosis not present

## 2020-11-05 ENCOUNTER — Other Ambulatory Visit: Payer: Self-pay | Admitting: Internal Medicine

## 2020-11-10 DIAGNOSIS — Z23 Encounter for immunization: Secondary | ICD-10-CM | POA: Diagnosis not present

## 2020-11-12 ENCOUNTER — Other Ambulatory Visit: Payer: Self-pay | Admitting: Internal Medicine

## 2020-11-12 DIAGNOSIS — F411 Generalized anxiety disorder: Secondary | ICD-10-CM

## 2020-11-27 DIAGNOSIS — G20 Parkinson's disease: Secondary | ICD-10-CM | POA: Diagnosis not present

## 2021-01-01 DIAGNOSIS — G14 Postpolio syndrome: Secondary | ICD-10-CM | POA: Diagnosis not present

## 2021-01-01 DIAGNOSIS — G20 Parkinson's disease: Secondary | ICD-10-CM | POA: Diagnosis not present

## 2021-01-01 DIAGNOSIS — G249 Dystonia, unspecified: Secondary | ICD-10-CM | POA: Diagnosis not present

## 2021-01-01 DIAGNOSIS — R441 Visual hallucinations: Secondary | ICD-10-CM | POA: Diagnosis not present

## 2021-01-05 ENCOUNTER — Ambulatory Visit (INDEPENDENT_AMBULATORY_CARE_PROVIDER_SITE_OTHER): Payer: Medicare Other | Admitting: Nurse Practitioner

## 2021-01-05 ENCOUNTER — Encounter: Payer: Self-pay | Admitting: Nurse Practitioner

## 2021-01-05 ENCOUNTER — Other Ambulatory Visit: Payer: Self-pay

## 2021-01-05 VITALS — BP 139/90 | HR 60 | Temp 98.2°F | Resp 16 | Ht 63.0 in | Wt 152.4 lb

## 2021-01-05 DIAGNOSIS — R1114 Bilious vomiting: Secondary | ICD-10-CM | POA: Diagnosis not present

## 2021-01-05 DIAGNOSIS — K449 Diaphragmatic hernia without obstruction or gangrene: Secondary | ICD-10-CM | POA: Diagnosis not present

## 2021-01-05 DIAGNOSIS — K219 Gastro-esophageal reflux disease without esophagitis: Secondary | ICD-10-CM

## 2021-01-05 MED ORDER — FAMOTIDINE 20 MG PO TABS: 20.0000 mg | ORAL_TABLET | Freq: Two times a day (BID) | ORAL | 2 refills | Status: DC | PRN

## 2021-01-05 MED ORDER — PANTOPRAZOLE SODIUM 40 MG PO TBEC: 40.0000 mg | DELAYED_RELEASE_TABLET | Freq: Two times a day (BID) | ORAL | 1 refills | Status: DC

## 2021-01-05 NOTE — Progress Notes (Signed)
[facility] [place], [place] [address]  Internal MEDICINE  Office Visit Note  Patient Name: Susan Shah  [id number]  [id number]  Date of Service: 01/05/2021  Chief Complaint  Patient presents with   Gastroesophageal Reflux    Burning, nausea, vomiting when having the acid.  2-3 days. Burning in chest     HPI Dillyn presents for an acute sick visit for severe acid reflux. She reports that she can feel burning in her chest and nausea x2-3 days. She reports vomiting sometimes due to the burning and nausea.  She has been taking pantoprazole 40 mg twice daily.     Current Medication:  Outpatient Encounter Medications as of 01/05/2021  Medication Sig   famotidine (PEPCID) 20 MG tablet Take 1 tablet (20 mg total) by mouth 2 (two) times daily as needed for heartburn or indigestion.   acetaminophen (TYLENOL) 500 MG tablet Take 1,000 mg by mouth every 6 (six) hours as needed for moderate pain or headache.   amLODipine (NORVASC) 2.5 MG tablet TAKE 1 TABLET BY MOUTH EVERY DAY - STOP LISINOPRIL   buPROPion (WELLBUTRIN XL) 150 MG 24 hr tablet TAKE 1 TABLET BY MOUTH EVERY DAY IN THE MORNING   carbidopa-levodopa (SINEMET) 25-100 MG tablet Take one tab po qpm   Carboxymethylcellul-Glycerin (LUBRICATING EYE DROPS OP) Place 1 drop into both eyes daily as needed (dry eyes).   cholecalciferol (VITAMIN D3) 25 MCG (1000 UNIT) tablet Take 1,000 Units by mouth daily.   escitalopram (LEXAPRO) 20 MG tablet TAKE 1 TABLET BY MOUTH EVERY DAY   fluticasone (FLONASE) 50 MCG/ACT nasal spray Place 1 spray into both nostrils at bedtime as needed for allergies or rhinitis.   HYDROcodone-acetaminophen (NORCO) 5-325 MG tablet Take 1 tablet by mouth every 4 (four) hours as needed for moderate pain.   loratadine (QC LORATADINE ALLERGY RELIEF) 10 MG tablet Take 1 tablet (10 mg total) by mouth daily.   NON FORMULARY cpap device   nystatin (MYCOSTATIN) 100000 UNIT/ML suspension Take 5 mLs  (500,000 Units total) by mouth 4 (four) times daily.   ondansetron (ZOFRAN-ODT) 4 MG disintegrating tablet Take 1 tablet (4 mg total) by mouth every 8 (eight) hours as needed for nausea or vomiting.   oxybutynin (DITROPAN-XL) 10 MG 24 hr tablet TAKE 2 TABLETS BY MOUTH EVERY DAY   pantoprazole (PROTONIX) 40 MG tablet Take 1 tablet (40 mg total) by mouth 2 (two) times daily.   potassium chloride SA (KLOR-CON) 20 MEQ tablet Take 1 tablet (20 mEq total) by mouth daily.   rOPINIRole (REQUIP) 0.25 MG tablet Take 1 tablet (0.25 mg total) by mouth 3 (three) times daily.   simvastatin (ZOCOR) 20 MG tablet TAKE 1 TABLET BY MOUTH DAILY AT 6 PM.   tiZANidine (ZANAFLEX) 2 MG tablet Take 1 tablet (2 mg total) by mouth 2 (two) times daily as needed for muscle spasms.   vitamin B-12 (CYANOCOBALAMIN) 1000 MCG tablet Take 1,000 mcg by mouth daily.   [DISCONTINUED] losartan (COZAAR) 100 MG tablet Take 1 tablet (100 mg total) by mouth daily.   [DISCONTINUED] metoprolol tartrate (LOPRESSOR) 25 MG tablet TAKE 1 TABLET (25 MG TOTAL) BY MOUTH 2 (TWO) TIMES DAILY WITH A MEAL.   [DISCONTINUED] pantoprazole (PROTONIX) 40 MG tablet Take 1 tablet (40 mg total) by mouth 2 (two) times daily.   [DISCONTINUED] ranitidine (ZANTAC) 150 MG capsule Take 1 capsule (150 mg total) by mouth 2 (two) times daily.   No facility-administered encounter medications on file as  of 01/05/2021.      Medical History: Past Medical History:  Diagnosis Date   Anxiety    Asthma 2015   mild, seasonal allergy triggered.   Cancer [facility]) 2013   skin cancer  on left hand   Depression    Esophageal dysmotility    GERD (gastroesophageal reflux disease) 11/05/2012   History of hiatal hernia    Hyperlipidemia    Hypertension    Incontinence of urine    Lung nodule    OA (osteoarthritis)    Obesity    Parkinson's disease (HCC)    PONV (postoperative nausea and vomiting)    Post-polio syndrome    contracted at 57 months old   Pulmonary nodule  11/05/2012   RML   Shingles 2009   Sleep apnea      Vital Signs: BP 139/90    Pulse 60    Temp 98.2 F (36.8 C)    Resp 16    Ht 5\' 3"  (1.6 m)    Wt 152 lb 6.4 oz (69.1 kg)    SpO2 95%    BMI 27.00 kg/m    Review of Systems  Constitutional:  Positive for appetite change. Negative for chills, fatigue and unexpected weight change.  HENT:  Negative for congestion, rhinorrhea, sneezing and sore throat.   Eyes:  Negative for redness.  Respiratory:  Negative for cough, chest tightness and shortness of breath.   Cardiovascular:  Negative for chest pain and palpitations.  Gastrointestinal:  Positive for nausea and vomiting. Negative for abdominal pain, constipation and diarrhea.       Burning in chest/heartburn  Genitourinary:  Negative for dysuria and frequency.  Musculoskeletal:  Negative for arthralgias, back pain, joint swelling and neck pain.  Skin:  Negative for rash.  Neurological: Negative.  Negative for tremors and numbness.  Hematological:  Negative for adenopathy. Does not bruise/bleed easily.  Psychiatric/Behavioral:  Negative for behavioral problems (Depression), sleep disturbance and suicidal ideas. The patient is not nervous/anxious.    Physical Exam Vitals reviewed.  Constitutional:      General: She is not in acute distress.    Appearance: Normal appearance. She is normal weight. She is not ill-appearing.  HENT:     Head: Normocephalic and atraumatic.  Eyes:     Pupils: Pupils are equal, round, and reactive to light.  Cardiovascular:     Rate and Rhythm: Normal rate and regular rhythm.  Pulmonary:     Effort: Pulmonary effort is normal. No respiratory distress.  Abdominal:     General: Bowel sounds are normal. There is no distension.     Palpations: Abdomen is soft.     Tenderness: There is no abdominal tenderness. There is no guarding.     Hernia: No hernia is present.  Neurological:     Mental Status: She is alert and oriented to person, place, and time.      Cranial Nerves: No cranial nerve deficit.     Gait: Gait normal.  Psychiatric:        Mood and Affect: Mood normal.        Behavior: Behavior normal.      Assessment/Plan: 1. Hiatal hernia with gastroesophageal reflux Continue pantoprazole as prescribed, add famotidine 20 mg twice daily.  - pantoprazole (PROTONIX) 40 MG tablet; Take 1 tablet (40 mg total) by mouth 2 (two) times daily.  Dispense: 180 tablet; Refill: 1 - famotidine (PEPCID) 20 MG tablet; Take 1 tablet (20 mg total) by mouth 2 (two) times daily  as needed for heartburn or indigestion.  Dispense: 60 tablet; Refill: 2  2. Bilious vomiting with nausea If nausea not relieved with pantoprazole and famotidine, will prescribe zofran as needed.    General Counseling: Giovanna verbalizes understanding of the findings of todays visit and agrees with plan of treatment. I have discussed any further diagnostic evaluation that may be needed or ordered today. We also reviewed her medications today. she has been encouraged to call the office with any questions or concerns that should arise related to todays visit.    Counseling:    No orders of the defined types were placed in this encounter.   Meds ordered this encounter  Medications   pantoprazole (PROTONIX) 40 MG tablet    Sig: Take 1 tablet (40 mg total) by mouth 2 (two) times daily.    Dispense:  180 tablet    Refill:  1   famotidine (PEPCID) 20 MG tablet    Sig: Take 1 tablet (20 mg total) by mouth 2 (two) times daily as needed for heartburn or indigestion.    Dispense:  60 tablet    Refill:  2    Return if symptoms worsen or fail to improve.  [place] Controlled Substance Database was reviewed by me for overdose risk score (ORS)  Time spent:20 Minutes Time spent with patient included reviewing progress notes, labs, imaging studies, and discussing plan for follow up.   This patient was seen by Jonetta Osgood, FNP-C in collaboration with Dr. Clayborn Bigness as a part of  collaborative care agreement.  Jarris Kortz R. Valetta Fuller, MSN, FNP-C Internal Medicine

## 2021-01-12 ENCOUNTER — Ambulatory Visit: Payer: Medicare Other | Admitting: Nurse Practitioner

## 2021-01-20 ENCOUNTER — Other Ambulatory Visit: Payer: Self-pay | Admitting: Nurse Practitioner

## 2021-01-20 DIAGNOSIS — I10 Essential (primary) hypertension: Secondary | ICD-10-CM

## 2021-02-03 ENCOUNTER — Other Ambulatory Visit: Payer: Self-pay | Admitting: Internal Medicine

## 2021-02-09 ENCOUNTER — Encounter: Payer: Self-pay | Admitting: Nurse Practitioner

## 2021-02-18 ENCOUNTER — Other Ambulatory Visit: Payer: Self-pay | Admitting: Internal Medicine

## 2021-02-20 ENCOUNTER — Telehealth: Payer: Self-pay

## 2021-02-20 ENCOUNTER — Ambulatory Visit (INDEPENDENT_AMBULATORY_CARE_PROVIDER_SITE_OTHER): Payer: Medicare Other | Admitting: Nurse Practitioner

## 2021-02-20 ENCOUNTER — Other Ambulatory Visit: Payer: Self-pay

## 2021-02-20 ENCOUNTER — Encounter: Payer: Self-pay | Admitting: Nurse Practitioner

## 2021-02-20 VITALS — BP 140/90 | HR 70 | Temp 99.2°F | Resp 16 | Ht 63.0 in | Wt 153.4 lb

## 2021-02-20 DIAGNOSIS — M26609 Unspecified temporomandibular joint disorder, unspecified side: Secondary | ICD-10-CM

## 2021-02-20 DIAGNOSIS — Z0001 Encounter for general adult medical examination with abnormal findings: Secondary | ICD-10-CM | POA: Diagnosis not present

## 2021-02-20 DIAGNOSIS — B370 Candidal stomatitis: Secondary | ICD-10-CM

## 2021-02-20 DIAGNOSIS — G20A1 Parkinson's disease without dyskinesia, without mention of fluctuations: Secondary | ICD-10-CM

## 2021-02-20 DIAGNOSIS — E559 Vitamin D deficiency, unspecified: Secondary | ICD-10-CM | POA: Diagnosis not present

## 2021-02-20 DIAGNOSIS — I10 Essential (primary) hypertension: Secondary | ICD-10-CM

## 2021-02-20 DIAGNOSIS — F411 Generalized anxiety disorder: Secondary | ICD-10-CM

## 2021-02-20 DIAGNOSIS — E782 Mixed hyperlipidemia: Secondary | ICD-10-CM | POA: Diagnosis not present

## 2021-02-20 DIAGNOSIS — G20 Parkinson's disease: Secondary | ICD-10-CM

## 2021-02-20 DIAGNOSIS — E039 Hypothyroidism, unspecified: Secondary | ICD-10-CM

## 2021-02-20 DIAGNOSIS — R300 Dysuria: Secondary | ICD-10-CM | POA: Diagnosis not present

## 2021-02-20 DIAGNOSIS — K219 Gastro-esophageal reflux disease without esophagitis: Secondary | ICD-10-CM | POA: Diagnosis not present

## 2021-02-20 DIAGNOSIS — G4733 Obstructive sleep apnea (adult) (pediatric): Secondary | ICD-10-CM

## 2021-02-20 DIAGNOSIS — R1114 Bilious vomiting: Secondary | ICD-10-CM

## 2021-02-20 DIAGNOSIS — Z1231 Encounter for screening mammogram for malignant neoplasm of breast: Secondary | ICD-10-CM | POA: Diagnosis not present

## 2021-02-20 DIAGNOSIS — R112 Nausea with vomiting, unspecified: Secondary | ICD-10-CM | POA: Diagnosis not present

## 2021-02-20 MED ORDER — POTASSIUM CHLORIDE CRYS ER 20 MEQ PO TBCR: 20.0000 meq | EXTENDED_RELEASE_TABLET | Freq: Every day | ORAL | 1 refills | Status: DC

## 2021-02-20 MED ORDER — OXYBUTYNIN CHLORIDE ER 10 MG PO TB24
20.0000 mg | ORAL_TABLET | Freq: Every day | ORAL | 1 refills | Status: DC
Start: 2021-02-20 — End: 2021-06-26

## 2021-02-20 MED ORDER — ROPINIROLE HCL 0.25 MG PO TABS: 0.2500 mg | ORAL_TABLET | Freq: Three times a day (TID) | ORAL | 3 refills | Status: DC

## 2021-02-20 MED ORDER — ONDANSETRON 4 MG PO TBDP: 4.0000 mg | ORAL_TABLET | Freq: Three times a day (TID) | ORAL | 1 refills | Status: DC | PRN

## 2021-02-20 MED ORDER — CARBIDOPA-LEVODOPA 25-100 MG PO TABS: ORAL_TABLET | ORAL | 3 refills | Status: DC

## 2021-02-20 MED ORDER — FLUCONAZOLE 150 MG PO TABS
150.0000 mg | ORAL_TABLET | Freq: Once | ORAL | 0 refills | Status: AC
Start: 2021-02-20 — End: 2021-02-20

## 2021-02-20 MED ORDER — ESCITALOPRAM OXALATE 20 MG PO TABS: 20.0000 mg | ORAL_TABLET | Freq: Every day | ORAL | 1 refills | Status: DC

## 2021-02-20 MED ORDER — BUPROPION HCL ER (XL) 150 MG PO TB24: 150.0000 mg | ORAL_TABLET | Freq: Every day | ORAL | 1 refills | Status: DC

## 2021-02-20 MED ORDER — HYDROCODONE-ACETAMINOPHEN 5-325 MG PO TABS: 1.0000 | ORAL_TABLET | ORAL | 0 refills | Status: DC | PRN

## 2021-02-20 NOTE — Telephone Encounter (Signed)
Notified patient of mammogram appointment date, time, new location & telephone #-Toni

## 2021-02-20 NOTE — Progress Notes (Signed)
[facility] [place], [place] [address]  Internal MEDICINE  Office Visit Note  Patient Name: Susan Shah  [id number]  [id number]  Date of Service: 02/20/2021  Chief Complaint  Patient presents with   Medicare Wellness   Asthma   Anxiety   Depression   Gastroesophageal Reflux   Hypertension   Hyperlipidemia    HPI Airiana presents for an annual well visit and physical exam. She is a well-appearing 76 yo female with hypertension,GERD, and parkinson disease. She is due for routine mammogram and routine labs. Her last colonoscopy was in 2019. Her BMD screening was done in 2017. She is not due for any other preventive screenings. At her previous office visit, her medication for acid reflux was increased and her acid reflux has been much better controlled.  Her blood pressure is elevated but improved when rechecked, see vitals.  She denies any pain and has no other questions or concerns.     Current Medication: Outpatient Encounter Medications as of 02/20/2021  Medication Sig   acetaminophen (TYLENOL) 500 MG tablet Take 1,000 mg by mouth every 6 (six) hours as needed for moderate pain or headache.   amLODipine (NORVASC) 2.5 MG tablet TAKE 1 TABLET BY MOUTH EVERY DAY - STOP LISINOPRIL   Carboxymethylcellul-Glycerin (LUBRICATING EYE DROPS OP) Place 1 drop into both eyes daily as needed (dry eyes).   cholecalciferol (VITAMIN D3) 25 MCG (1000 UNIT) tablet Take 1,000 Units by mouth daily.   famotidine (PEPCID) 20 MG tablet Take 1 tablet (20 mg total) by mouth 2 (two) times daily as needed for heartburn or indigestion.   [EXPIRED] fluconazole (DIFLUCAN) 150 MG tablet Take 1 tablet (150 mg total) by mouth once for 1 dose. May take an additional dose after 3 days if still symptomatic.   fluticasone (FLONASE) 50 MCG/ACT nasal spray Place 1 spray into both nostrils at bedtime as needed for allergies or rhinitis.   loratadine (QC LORATADINE ALLERGY RELIEF) 10 MG tablet  Take 1 tablet (10 mg total) by mouth daily.   losartan (COZAAR) 100 MG tablet TAKE 1 TABLET BY MOUTH EVERY DAY   metoprolol tartrate (LOPRESSOR) 25 MG tablet TAKE 1 TABLET (25 MG TOTAL) BY MOUTH 2 (TWO) TIMES DAILY WITH A MEAL.   NON FORMULARY cpap device   nystatin (MYCOSTATIN) 100000 UNIT/ML suspension Take 5 mLs (500,000 Units total) by mouth 4 (four) times daily.   pantoprazole (PROTONIX) 40 MG tablet Take 1 tablet (40 mg total) by mouth 2 (two) times daily.   simvastatin (ZOCOR) 20 MG tablet TAKE 1 TABLET BY MOUTH DAILY AT 6 PM.   vitamin B-12 (CYANOCOBALAMIN) 1000 MCG tablet Take 1,000 mcg by mouth daily.   [DISCONTINUED] buPROPion (WELLBUTRIN XL) 150 MG 24 hr tablet TAKE 1 TABLET BY MOUTH EVERY DAY IN THE MORNING   [DISCONTINUED] carbidopa-levodopa (SINEMET) 25-100 MG tablet Take one tab po qpm   [DISCONTINUED] escitalopram (LEXAPRO) 20 MG tablet TAKE 1 TABLET BY MOUTH EVERY DAY   [DISCONTINUED] HYDROcodone-acetaminophen (NORCO) 5-325 MG tablet Take 1 tablet by mouth every 4 (four) hours as needed for moderate pain.   [DISCONTINUED] ondansetron (ZOFRAN-ODT) 4 MG disintegrating tablet Take 1 tablet (4 mg total) by mouth every 8 (eight) hours as needed for nausea or vomiting.   [DISCONTINUED] oxybutynin (DITROPAN-XL) 10 MG 24 hr tablet TAKE 2 TABLETS BY MOUTH EVERY DAY   [DISCONTINUED] potassium chloride SA (KLOR-CON) 20 MEQ tablet Take 1 tablet (20 mEq total) by mouth daily.   [DISCONTINUED] rOPINIRole (  REQUIP) 0.25 MG tablet Take 1 tablet (0.25 mg total) by mouth 3 (three) times daily.   [DISCONTINUED] tiZANidine (ZANAFLEX) 2 MG tablet Take 1 tablet (2 mg total) by mouth 2 (two) times daily as needed for muscle spasms.   buPROPion (WELLBUTRIN XL) 150 MG 24 hr tablet Take 1 tablet (150 mg total) by mouth daily.   carbidopa-levodopa (SINEMET) 25-100 MG tablet Take one tab po qpm   escitalopram (LEXAPRO) 20 MG tablet Take 1 tablet (20 mg total) by mouth daily.   HYDROcodone-acetaminophen  (NORCO) 5-325 MG tablet Take 1 tablet by mouth every 4 (four) hours as needed for moderate pain or severe pain. No more than 6 tablets per 24 hours   ondansetron (ZOFRAN-ODT) 4 MG disintegrating tablet Take 1 tablet (4 mg total) by mouth every 8 (eight) hours as needed for nausea or vomiting.   oxybutynin (DITROPAN-XL) 10 MG 24 hr tablet Take 2 tablets (20 mg total) by mouth daily.   potassium chloride SA (KLOR-CON M) 20 MEQ tablet Take 1 tablet (20 mEq total) by mouth daily.   rOPINIRole (REQUIP) 0.25 MG tablet Take 1 tablet (0.25 mg total) by mouth 3 (three) times daily.   No facility-administered encounter medications on file as of 02/20/2021.    Surgical History: Past Surgical History:  Procedure Laterality Date   34 HOUR [place] STUDY N/A 04/24/2016   Procedure: 24 HOUR PH STUDY;  Surgeon: Lucilla Lame, MD;  Location: [facility] ENDOSCOPY;  Service: Endoscopy;  Laterality: N/A;   ABDOMINAL HYSTERECTOMY     Total   APPENDECTOMY     CARPOMETACARPAL ([facility]) FUSION OF THUMB Left 05/16/2016   Procedure: CARPOMETACARPAL [facility]) FUSION OF THUMB;  Surgeon: Hessie Knows, MD;  Location: [facility] ORS;  Service: Orthopedics;  Laterality: Left;   CARPOMETACARPAL ([place]) FUSION OF THUMB Left 03/30/2019   Procedure: LEFT THUMB SUSPENSION PLASTY;  Surgeon: Hessie Knows, MD;  Location: [facility] ORS;  Service: Orthopedics;  Laterality: Left;   CATARACT EXTRACTION Bilateral 12/2012   CHOLECYSTECTOMY     COLONOSCOPY  2003   ESOPHAGEAL MANOMETRY N/A 04/24/2016   Procedure: ESOPHAGEAL MANOMETRY (EM);  Surgeon: Lucilla Lame, MD;  Location: [facility] ENDOSCOPY;  Service: Endoscopy;  Laterality: N/A;   ESOPHAGOGASTRODUODENOSCOPY  08/2011   FOOT SURGERY     HALLUX VALGUS CORRECTION     HIP SURGERY Right 1950   tendon release r/t polio   KNEE ARTHROSCOPY Left 06/23/2008   RETINAL DETACHMENT SURGERY Right 03/2014   ROTATOR CUFF REPAIR Bilateral    URINARY SURGERY  2014   [place]    Medical History: Past Medical History:  Diagnosis  Date   Anxiety    Asthma 2015   mild, seasonal allergy triggered.   Cancer ([place]) 2013   skin cancer  on left hand   Depression    Esophageal dysmotility    GERD (gastroesophageal reflux disease) 11/05/2012   History of hiatal hernia    Hyperlipidemia    Hypertension    Incontinence of urine    Lung nodule    OA (osteoarthritis)    Obesity    Parkinson's disease (HCC)    PONV (postoperative nausea and vomiting)    Post-polio syndrome    contracted at 36 months old   Pulmonary nodule 11/05/2012   RML   Shingles 2009   Sleep apnea     Family History: Family History  Problem Relation Age of Onset   Heart disease Mother    Heart disease Father    Heart disease Brother  Stroke Maternal Grandmother    Heart disease Maternal Grandfather    Heart attack Brother    Breast cancer Maternal Aunt     Social History   Socioeconomic History   Marital status: Married    Spouse name: Not on file   Number of children: Not on file   Years of education: Not on file   Highest education level: Not on file  Occupational History   Not on file  Tobacco Use   Smoking status: Former    Types: Cigarettes    Quit date: 05/09/1968    Years since quitting: 52.8   Smokeless tobacco: Never  Vaping Use   Vaping Use: Never used  Substance and Sexual Activity   Alcohol use: No   Drug use: No   Sexual activity: Not on file  Other Topics Concern   Not on file  Social History Narrative   Not on file   Social Determinants of Health   Financial Resource Strain: Not on file  Food Insecurity: Not on file  Transportation Needs: Not on file  Physical Activity: Not on file  Stress: Not on file  Social Connections: Not on file  Intimate Partner Violence: Not on file      Review of Systems  Constitutional:  Negative for activity change, appetite change, chills, fatigue, fever and unexpected weight change.  HENT: Negative.  Negative for congestion, ear pain, rhinorrhea, sore throat and  trouble swallowing.   Eyes: Negative.   Respiratory: Negative.  Negative for cough, chest tightness, shortness of breath and wheezing.   Cardiovascular: Negative.  Negative for chest pain.  Gastrointestinal: Negative.  Negative for abdominal pain, blood in stool, constipation, diarrhea, nausea and vomiting.  Endocrine: Negative.   Genitourinary: Negative.  Negative for difficulty urinating, dysuria, frequency, hematuria and urgency.  Musculoskeletal: Negative.  Negative for arthralgias, back pain, joint swelling, myalgias and neck pain.  Skin: Negative.  Negative for rash and wound.  Allergic/Immunologic: Negative.  Negative for immunocompromised state.  Neurological: Negative.  Negative for dizziness, seizures, numbness and headaches.  Hematological: Negative.   Psychiatric/Behavioral:  Positive for depression. Negative for behavioral problems, self-injury and suicidal ideas. The patient is not nervous/anxious.    Vital Signs: BP 140/90 Comment: 164/92   Pulse 70    Temp 99.2 F (37.3 C)    Resp 16    Ht '5\' 3"'  (1.6 m)    Wt 153 lb 6.4 oz (69.6 kg)    SpO2 97%    BMI 27.17 kg/m    Physical Exam Vitals reviewed.  Constitutional:      General: She is awake. She is not in acute distress.    Appearance: Normal appearance. She is well-developed, well-groomed and normal weight. She is not ill-appearing or diaphoretic.  HENT:     Head: Normocephalic and atraumatic.     Right Ear: Tympanic membrane, ear canal and external ear normal.     Left Ear: Tympanic membrane, ear canal and external ear normal.     Nose: Nose normal. No congestion or rhinorrhea.     Mouth/Throat:     Lips: Pink.     Mouth: Mucous membranes are moist.     Pharynx: Oropharynx is clear. Uvula midline. No oropharyngeal exudate or posterior oropharyngeal erythema.  Eyes:     General: Lids are normal. Vision grossly intact. Gaze aligned appropriately. No scleral icterus.       Right eye: No discharge.        Left eye: No  discharge.  Extraocular Movements: Extraocular movements intact.     Conjunctiva/sclera: Conjunctivae normal.     Pupils: Pupils are equal, round, and reactive to light.     Funduscopic exam:    Right eye: Red reflex present.        Left eye: Red reflex present. Neck:     Thyroid: No thyromegaly.     Vascular: No JVD.     Trachea: Trachea and phonation normal. No tracheal deviation.  Cardiovascular:     Rate and Rhythm: Normal rate and regular rhythm.     Pulses: Normal pulses.     Heart sounds: Normal heart sounds, S1 normal and S2 normal. No murmur heard.   No friction rub. No gallop.  Pulmonary:     Effort: Pulmonary effort is normal. No accessory muscle usage or respiratory distress.     Breath sounds: Normal breath sounds and air entry. No stridor. No wheezing or rales.  Chest:     Chest wall: No tenderness.  Breasts:    Right: Normal. No swelling, bleeding, inverted nipple, mass, nipple discharge, skin change or tenderness.     Left: Normal. No swelling, bleeding, inverted nipple, mass, nipple discharge, skin change or tenderness.  Abdominal:     General: Bowel sounds are normal. There is no distension.     Palpations: Abdomen is soft. There is no mass.     Tenderness: There is no abdominal tenderness. There is no guarding or rebound.  Musculoskeletal:        General: No tenderness or deformity. Normal range of motion.     Cervical back: Normal range of motion and neck supple.     Right lower leg: No edema.     Left lower leg: No edema.  Lymphadenopathy:     Cervical: No cervical adenopathy.     Upper Body:     Right upper body: No supraclavicular, axillary or pectoral adenopathy.     Left upper body: No supraclavicular, axillary or pectoral adenopathy.  Skin:    General: Skin is warm and dry.     Coloration: Skin is not pale.     Findings: No erythema or rash.  Neurological:     Mental Status: She is alert and oriented to person, place, and time.     Cranial  Nerves: No cranial nerve deficit.     Motor: No abnormal muscle tone.     Coordination: Coordination normal.     Gait: Gait normal.     Deep Tendon Reflexes: Reflexes are normal and symmetric.  Psychiatric:        Mood and Affect: Mood normal.        Behavior: Behavior normal. Behavior is cooperative.        Thought Content: Thought content normal.        Judgment: Judgment normal.       Assessment/Plan: 1. Encounter for routine adult health examination with abnormal findings Age-appropriate preventive screenings and vaccinations discussed, annual physical exam completed. Routine labs for health maintenance ordered, see below. PHM updated.   2. Essential hypertension Stable, continue current medications as prescribed.   3. Parkinson disease ([place]) Followed by neurology. Refills ordered, routine labs ordered.  - CBC with Differential/Platelet - CMP14+EGFR - carbidopa-levodopa (SINEMET) 25-100 MG tablet; Take one tab po qpm  Dispense: 90 tablet; Refill: 3 - rOPINIRole (REQUIP) 0.25 MG tablet; Take 1 tablet (0.25 mg total) by mouth 3 (three) times daily.  Dispense: 270 tablet; Refill: 3  4. TMJ (temporomandibular joint disorder) Refill ordered.  -  HYDROcodone-acetaminophen (NORCO) 5-325 MG tablet; Take 1 tablet by mouth every 4 (four) hours as needed for moderate pain or severe pain. No more than 6 tablets per 24 hours  Dispense: 30 tablet; Refill: 0  5. Non-intractable vomiting with nausea Zofran refills ordered. Routine labs ordered.  - CBC with Differential/Platelet - CMP14+EGFR - ondansetron (ZOFRAN-ODT) 4 MG disintegrating tablet; Take 1 tablet (4 mg total) by mouth every 8 (eight) hours as needed for nausea or vomiting.  Dispense: 30 tablet; Refill: 1  6. Gastroesophageal reflux disease without esophagitis Stable with current medication  7. Mixed hyperlipidemia Routine lab ordered.  - Lipid Profile  8. Vitamin D deficiency Routine lab ordered - Vitamin D (25  hydroxy)  9. Hypothyroidism, unspecified type Routine lab ordered - TSH + free T4  10. Oropharyngeal candidiasis Fluconazole prescribed.  - fluconazole (DIFLUCAN) 150 MG tablet; Take 1 tablet (150 mg total) by mouth once for 1 dose. May take an additional dose after 3 days if still symptomatic.  Dispense: 3 tablet; Refill: 0  11. Dysuria Routine urinalysis done - UA/M w/rflx Culture, Routine  12. GAD (generalized anxiety disorder) Stable, taking bupropion and escitalopram.  - buPROPion (WELLBUTRIN XL) 150 MG 24 hr tablet; Take 1 tablet (150 mg total) by mouth daily.  Dispense: 90 tablet; Refill: 1 - escitalopram (LEXAPRO) 20 MG tablet; Take 1 tablet (20 mg total) by mouth daily.  Dispense: 90 tablet; Refill: 1  13. Encounter for screening mammogram for malignant neoplasm of breast Routine mammogram ordered. - MM 3D SCREEN BREAST BILATERAL; Future      General Counseling: Mija verbalizes understanding of the findings of todays visit and agrees with plan of treatment. I have discussed any further diagnostic evaluation that may be needed or ordered today. We also reviewed her medications today. she has been encouraged to call the office with any questions or concerns that should arise related to todays visit.    Orders Placed This Encounter  Procedures   MM 3D SCREEN BREAST BILATERAL   UA/M w/rflx Culture, Routine   CBC with Differential/Platelet   Lipid Profile   CMP14+EGFR   TSH + free T4   Vitamin D (25 hydroxy)    Meds ordered this encounter  Medications   buPROPion (WELLBUTRIN XL) 150 MG 24 hr tablet    Sig: Take 1 tablet (150 mg total) by mouth daily.    Dispense:  90 tablet    Refill:  1   carbidopa-levodopa (SINEMET) 25-100 MG tablet    Sig: Take one tab po qpm    Dispense:  90 tablet    Refill:  3   escitalopram (LEXAPRO) 20 MG tablet    Sig: Take 1 tablet (20 mg total) by mouth daily.    Dispense:  90 tablet    Refill:  1   oxybutynin (DITROPAN-XL) 10 MG  24 hr tablet    Sig: Take 2 tablets (20 mg total) by mouth daily.    Dispense:  180 tablet    Refill:  1   rOPINIRole (REQUIP) 0.25 MG tablet    Sig: Take 1 tablet (0.25 mg total) by mouth 3 (three) times daily.    Dispense:  270 tablet    Refill:  3   HYDROcodone-acetaminophen (NORCO) 5-325 MG tablet    Sig: Take 1 tablet by mouth every 4 (four) hours as needed for moderate pain or severe pain. No more than 6 tablets per 24 hours    Dispense:  30 tablet    Refill:  0   potassium chloride SA (KLOR-CON M) 20 MEQ tablet    Sig: Take 1 tablet (20 mEq total) by mouth daily.    Dispense:  90 tablet    Refill:  1   ondansetron (ZOFRAN-ODT) 4 MG disintegrating tablet    Sig: Take 1 tablet (4 mg total) by mouth every 8 (eight) hours as needed for nausea or vomiting.    Dispense:  30 tablet    Refill:  1   fluconazole (DIFLUCAN) 150 MG tablet    Sig: Take 1 tablet (150 mg total) by mouth once for 1 dose. May take an additional dose after 3 days if still symptomatic.    Dispense:  3 tablet    Refill:  0    Return in about 3 months (around 05/20/2021) for F/U, med refill, Kartel Wolbert PCP.   Total time spent:30 Minutes Time spent includes review of chart, medications, test results, and follow up plan with the patient.   [place] Controlled Substance Database was reviewed by me.  This patient was seen by Jonetta Osgood, FNP-C in collaboration with Dr. Clayborn Bigness as a part of collaborative care agreement.  Sharesa Kemp R. Valetta Fuller, MSN, FNP-C Internal medicine

## 2021-02-21 DIAGNOSIS — G4733 Obstructive sleep apnea (adult) (pediatric): Secondary | ICD-10-CM | POA: Diagnosis not present

## 2021-02-21 DIAGNOSIS — G8311 Monoplegia of lower limb affecting right dominant side: Secondary | ICD-10-CM | POA: Diagnosis not present

## 2021-02-21 DIAGNOSIS — G20 Parkinson's disease: Secondary | ICD-10-CM | POA: Diagnosis not present

## 2021-02-21 DIAGNOSIS — Z9989 Dependence on other enabling machines and devices: Secondary | ICD-10-CM | POA: Diagnosis not present

## 2021-02-21 DIAGNOSIS — G14 Postpolio syndrome: Secondary | ICD-10-CM | POA: Diagnosis not present

## 2021-03-11 ENCOUNTER — Encounter: Payer: Self-pay | Admitting: Nurse Practitioner

## 2021-03-19 ENCOUNTER — Telehealth: Payer: Self-pay

## 2021-03-19 DIAGNOSIS — M26609 Unspecified temporomandibular joint disorder, unspecified side: Secondary | ICD-10-CM

## 2021-03-20 MED ORDER — HYDROCODONE-ACETAMINOPHEN 5-325 MG PO TABS: 1.0000 | ORAL_TABLET | ORAL | 0 refills | Status: DC | PRN

## 2021-03-20 NOTE — Telephone Encounter (Signed)
Med sent to pharmacy.

## 2021-03-20 NOTE — Telephone Encounter (Signed)
Pt advised that we send med  

## 2021-04-02 ENCOUNTER — Other Ambulatory Visit: Payer: Self-pay | Admitting: Nurse Practitioner

## 2021-04-02 DIAGNOSIS — K219 Gastro-esophageal reflux disease without esophagitis: Secondary | ICD-10-CM

## 2021-04-18 ENCOUNTER — Ambulatory Visit: Payer: Medicare Other | Attending: Neurology

## 2021-04-18 DIAGNOSIS — R2681 Unsteadiness on feet: Secondary | ICD-10-CM | POA: Insufficient documentation

## 2021-04-18 DIAGNOSIS — G20 Parkinson's disease: Secondary | ICD-10-CM | POA: Diagnosis not present

## 2021-04-18 DIAGNOSIS — M6281 Muscle weakness (generalized): Secondary | ICD-10-CM | POA: Insufficient documentation

## 2021-04-19 NOTE — Therapy (Signed)
[place] [facility] ?[place], [place], [address] ?Phone: [phone]   Fax:  [phone] ? ?Occupational Therapy Evaluation ? ?Patient Details  ?Name: Susan Shah ?MRN: [id number] ?Date of Birth: 05-Sep-1945 ?Referring Provider (OT): Dr. Manuella Ghazi ? ? ?Encounter Date: 04/18/2021 ? ? OT End of Session - 04/19/21 1635   ? ? Visit Number 1   ? Number of Visits 17   ? Date for OT Re-Evaluation 05/18/21   ? Authorization Time Period Reporting period beginning 04/18/21   ? OT Start Time 1430   ? OT Stop Time 1540   ? OT Time Calculation (min) 70 min   ? Equipment Utilized During Treatment crutches, transport chair   ? Activity Tolerance Patient tolerated treatment well   ? Behavior During Therapy [facility] for tasks assessed/performed   ? ?  ?  ? ?  ? ? ?Past Medical History:  ?Diagnosis Date  ? Anxiety   ? Asthma 2015  ? mild, seasonal allergy triggered.  ? Cancer [facility]) 2013  ? skin cancer  on left hand  ? Depression   ? Esophageal dysmotility   ? GERD (gastroesophageal reflux disease) 11/05/2012  ? History of hiatal hernia   ? Hyperlipidemia   ? Hypertension   ? Incontinence of urine   ? Lung nodule   ? OA (osteoarthritis)   ? Obesity   ? Parkinson's disease ([place])   ? PONV (postoperative nausea and vomiting)   ? Post-polio syndrome   ? contracted at 59 months old  ? Pulmonary nodule 11/05/2012  ? RML  ? Shingles 2009  ? Sleep apnea   ? ? ?Past Surgical History:  ?Procedure Laterality Date  ? 61 HOUR [place] STUDY N/A 04/24/2016  ? Procedure: [place] STUDY;  Surgeon: Lucilla Lame, MD;  Location: [facility] ENDOSCOPY;  Service: Endoscopy;  Laterality: N/A;  ? ABDOMINAL HYSTERECTOMY    ? Total  ? APPENDECTOMY    ? CARPOMETACARPAL (CMC) FUSION OF THUMB Left 05/16/2016  ? Procedure: CARPOMETACARPAL [facility]) FUSION OF THUMB;  Surgeon: Hessie Knows, MD;  Location: [facility] ORS;  Service: Orthopedics;  Laterality: Left;  ? CARPOMETACARPAL (CMC) FUSION OF THUMB Left 03/30/2019  ? Procedure: LEFT  THUMB SUSPENSION PLASTY;  Surgeon: Hessie Knows, MD;  Location: [facility] ORS;  Service: Orthopedics;  Laterality: Left;  ? CATARACT EXTRACTION Bilateral 12/2012  ? CHOLECYSTECTOMY    ? COLONOSCOPY  2003  ? ESOPHAGEAL MANOMETRY N/A 04/24/2016  ? Procedure: ESOPHAGEAL MANOMETRY (EM);  Surgeon: Lucilla Lame, MD;  Location: [facility] ENDOSCOPY;  Service: Endoscopy;  Laterality: N/A;  ? ESOPHAGOGASTRODUODENOSCOPY  08/2011  ? FOOT SURGERY    ? HALLUX VALGUS CORRECTION    ? HIP SURGERY Right 1950  ? tendon release r/t polio  ? KNEE ARTHROSCOPY Left 06/23/2008  ? RETINAL DETACHMENT SURGERY Right 03/2014  ? ROTATOR CUFF REPAIR Bilateral   ? URINARY SURGERY  2014  ? [place]  ? ? ?There were no vitals filed for this visit. ? ? Subjective Assessment - 04/19/21 1551   ? ? Subjective  "I wish I didn't have to use them, but I've used my crutches for 60 years."   ? Pertinent History CMC arthroplasty revision 3/21, polio with RLE weakness and leg length discrepency (pt reports RLE ~4" shorter), PD   ? Limitations weakness, lack of coordination, decreased balance, hx of falling, BUE dyskinesia, trunk and cervical dyskinesia   ? Patient Stated Goals "To get stronger."   ? Currently in Pain?  No/denies   ? Pain Score 0-No pain   ? ?  ?  ? ?  ? ? ? ? [facility] OT Assessment - 04/19/21 0001   ? ?  ? Assessment  ? Medical Diagnosis Parkinson's Disease   ? Referring Provider (OT) Dr. Manuella Ghazi   ? Onset Date/Surgical Date 02/26/21   referred 02/26/21 by Dr. Manuella Ghazi; pt estimates being diagnosed with PD ~5-6 months ago  ? Hand Dominance Right   ? Prior Therapy none   ?  ? Precautions  ? Precautions Fall   ? Required Braces or Orthoses Other Brace/Splint   ? Other Brace/Splint Pt wears R KAFO for post polio monoplegia   ?  ? Restrictions  ? Weight Bearing Restrictions No   ?  ? Balance Screen  ? Has the patient fallen in the past 6 months Yes   ? How many times? 4-5   ? Has the patient had a decrease in activity level because of a fear of falling?  Yes   ? Is the  patient reluctant to leave their home because of a fear of falling?  No   ?  ? Home  Environment  ? Family/patient expects to be discharged to: Private residence   ? Living Arrangements Spouse/significant other   ? Available Help at Discharge Family   Son lives in [place] about an hour or less; 2 grandchildren local  ? Type of Home House   ? Home Access Ramped entrance   3 steps bilat support from railing on 1 side and grab bar on the other  ? Home Layout One level   ? Bathroom Building control surveyor   ? Bathroom Toilet Handicapped height   bilat rails  ? Additional Comments has power wc for community mobility, crutches for inside the house   ? Lives With Spouse   ?  ? Prior Function  ? Level of Independence Needs assistance with ADLs   ? Vocation Retired   ? Vocation Requirements Retired from [employer]   ? Leisure used to like working in the yard, used to like to [employer], currently enjoys being outside on the deck, putting flowers up on the front porch   ?  ? ADL  ? Eating/Feeding Set up   assist to cut meat  ? Grooming Independent   ? Upper Body Bathing Supervision/safety   ? Lower Body Bathing Supervision/safety   uses shower chair  ? Upper Body Dressing Needs assist for fasteners   ? Lower Body Dressing Needs assist for fasteners   ? Toilet Transfer Modified independent   ? Tub/Shower Transfer Supervision/safety   ? Tub/Shower Clinical biochemist bars;Shower seat with back;Walk in shower   ? Transfers/Ambulation Related to ADL's uses crutches for all transfers   ?  ? IADL  ? Meal Prep Needs to have meals prepared and served   ? Medication Management Is not capable of dispensing or managing own medication   spouse sets up pills using pill organizer  ? Financial Management --   pt can write checks but states it's hard to read them  ?  ? Mobility  ? Mobility Status History of falls   ? Mobility Status Comments pt uses crutches for household amb, power wc for community mobility at baseline   ?  ? Written  Expression  ? Dominant Hand Right   ? Handwriting 90% legible;Increased time   ?  ? Vision - History  ? Baseline Vision Wears glasses only for reading   ?  Additional Comments pt has had bilat cataract sx, hx of detached retina R eye, and visual hallucinations   ?  ? Activity Tolerance  ? Activity Tolerance Tolerates 30 min activity with multiple rests   ? Sitting Balance --   see BERG balance test  ?  ? Cognition  ? Overall Cognitive Status Within Functional Limits for tasks assessed   ?  ? Observation/Other Assessments  ? Focus on Therapeutic Outcomes (FOTO)  time constraints at eval; will complete next session   ? Outcome Measures BERG: 27/56 indicating need for AD d/t increased risk of falls; TUG: 30 sec with crutches (increased fall risk >13.8 sec)   ?  ? Posture/Postural Control  ? Posture Comments truncal and cervical dyskinesia   ?  ? Coordination  ? Gross Motor Movements are Fluid and Coordinated No   ? Fine Motor Movements are Fluid and Coordinated No   ? Coordination and Movement Description Pt has BUE tremors, but not noted at eval; noted only truncal and cervical tremor   ? Right 9 Hole Peg Test 34 sec   ? Left 9 Hole Peg Test 40 sec   ? Tremors BUE, trunk, cervical   ?  ? Tone  ? Assessment Location --   no increased tone or rigidity noted  ?  ? AROM  ? Overall AROM Comments BUEs WFL   ?  ? Strength  ? Overall Strength Comments Bilat shoulders, elbows, wrist grossly 4+/5; noted weakness in bilat hands, see below   ?  ? Hand Function  ? Right Hand Grip (lbs) 40   ? Left Hand Grip (lbs) 48   ? ?  ?  ? ?  ? ?Occupational Therapy Evaluation: ?Pt is a 76 y/o female present today for LSVT BIG evaluation.  Pt diagnosed with PD ~5-6 months ago.  Pt with hx of post polio syndrome with monoplegia affecting RLE; pt has used crutches at baseline for 60 years, per her report.  Pt has hx of frequent falls, most recent fall being Sunday, with pt reporting she tripped resulting in a fall forward and hitting her mouth  and nose.  Pt reports feeling physically better in the a.m., more fatigued in the p.m.  Pt takes Sinemet a.m. and p.m.  Pt's goal is to get stronger.  She verbalizes desire to rely on crutches less, despite using

## 2021-04-23 ENCOUNTER — Ambulatory Visit
Admission: RE | Admit: 2021-04-23 | Discharge: 2021-04-23 | Disposition: A | Discharge status: Home / Self Care | Payer: Medicare Other | Source: Ambulatory Visit | Attending: Nurse Practitioner | Admitting: Nurse Practitioner

## 2021-04-23 DIAGNOSIS — Z1231 Encounter for screening mammogram for malignant neoplasm of breast: Secondary | ICD-10-CM | POA: Diagnosis not present

## 2021-04-26 ENCOUNTER — Telehealth: Payer: Self-pay

## 2021-04-27 ENCOUNTER — Other Ambulatory Visit: Payer: Self-pay | Admitting: Nurse Practitioner

## 2021-04-27 DIAGNOSIS — M26609 Unspecified temporomandibular joint disorder, unspecified side: Secondary | ICD-10-CM

## 2021-04-27 MED ORDER — HYDROCODONE-ACETAMINOPHEN 5-325 MG PO TABS: 1.0000 | ORAL_TABLET | ORAL | 0 refills | Status: DC | PRN

## 2021-04-27 NOTE — Telephone Encounter (Signed)
Med sent to pharmacy.

## 2021-04-30 ENCOUNTER — Encounter: Payer: Self-pay | Admitting: Occupational Therapy

## 2021-04-30 ENCOUNTER — Ambulatory Visit: Payer: Medicare Other | Attending: Neurology | Admitting: Occupational Therapy

## 2021-04-30 DIAGNOSIS — R2681 Unsteadiness on feet: Secondary | ICD-10-CM | POA: Insufficient documentation

## 2021-04-30 DIAGNOSIS — M6281 Muscle weakness (generalized): Secondary | ICD-10-CM | POA: Insufficient documentation

## 2021-04-30 DIAGNOSIS — G20 Parkinson's disease: Secondary | ICD-10-CM | POA: Diagnosis not present

## 2021-04-30 DIAGNOSIS — R278 Other lack of coordination: Secondary | ICD-10-CM | POA: Diagnosis not present

## 2021-04-30 NOTE — Telephone Encounter (Signed)
Try to call pt that med sent voicemail is full ?

## 2021-04-30 NOTE — Therapy (Signed)
[place] [facility] ?[place], [place], [address] ?Phone: [phone]   Fax:  [phone] ? ?Occupational Therapy Treatment ? ?Patient Details  ?Name: Susan Shah ?MRN: [id number] ?Date of Birth: [birth year] ?Referring Provider (OT): Dr. Manuella Ghazi ? ? ?Encounter Date: 04/30/2021 ? ? OT End of Session - 04/30/21 1959   ? ? Visit Number 2   ? Number of Visits 17   ? Date for OT Re-Evaluation 05/18/21   ? Authorization Time Period Reporting period beginning 04/18/21   ? OT Start Time 1345   ? OT Stop Time 0962   ? OT Time Calculation (min) 59 min   ? Equipment Utilized During Treatment crutches, transport chair   ? Activity Tolerance Patient tolerated treatment well   ? Behavior During Therapy [facility] for tasks assessed/performed   ? ?  ?  ? ?  ? ? ?Past Medical History:  ?Diagnosis Date  ? Anxiety   ? Asthma 2015  ? mild, seasonal allergy triggered.  ? Cancer [facility]) 2013  ? skin cancer  on left hand  ? Depression   ? Esophageal dysmotility   ? GERD (gastroesophageal reflux disease) 11/05/2012  ? History of hiatal hernia   ? Hyperlipidemia   ? Hypertension   ? Incontinence of urine   ? Lung nodule   ? OA (osteoarthritis)   ? Obesity   ? Parkinson's disease ([place])   ? PONV (postoperative nausea and vomiting)   ? Post-polio syndrome   ? contracted at 70 months old  ? Pulmonary nodule 11/05/2012  ? RML  ? Shingles 2009  ? Sleep apnea   ? ? ?Past Surgical History:  ?Procedure Laterality Date  ? 65 HOUR [place] STUDY N/A 04/24/2016  ? Procedure: [place] STUDY;  Surgeon: Lucilla Lame, MD;  Location: [facility] ENDOSCOPY;  Service: Endoscopy;  Laterality: N/A;  ? ABDOMINAL HYSTERECTOMY    ? Total  ? APPENDECTOMY    ? CARPOMETACARPAL (CMC) FUSION OF THUMB Left 05/16/2016  ? Procedure: CARPOMETACARPAL [facility]) FUSION OF THUMB;  Surgeon: Hessie Knows, MD;  Location: [facility] ORS;  Service: Orthopedics;  Laterality: Left;  ? CARPOMETACARPAL (CMC) FUSION OF THUMB Left 03/30/2019  ? Procedure: LEFT THUMB  SUSPENSION PLASTY;  Surgeon: Hessie Knows, MD;  Location: [facility] ORS;  Service: Orthopedics;  Laterality: Left;  ? CATARACT EXTRACTION Bilateral 12/2012  ? CHOLECYSTECTOMY    ? COLONOSCOPY  2003  ? ESOPHAGEAL MANOMETRY N/A 04/24/2016  ? Procedure: ESOPHAGEAL MANOMETRY (EM);  Surgeon: Lucilla Lame, MD;  Location: [facility] ENDOSCOPY;  Service: Endoscopy;  Laterality: N/A;  ? ESOPHAGOGASTRODUODENOSCOPY  08/2011  ? FOOT SURGERY    ? HALLUX VALGUS CORRECTION    ? HIP SURGERY Right 1950  ? tendon release r/t polio  ? KNEE ARTHROSCOPY Left 06/23/2008  ? RETINAL DETACHMENT SURGERY Right 03/2014  ? ROTATOR CUFF REPAIR Bilateral   ? URINARY SURGERY  2014  ? [place]  ? ? ?There were no vitals filed for this visit. ? ? Subjective Assessment - 04/30/21 1957   ? ? Subjective  Pt reports she would love to be able to go and check the mail again like she did in the past.   ? Pertinent History CMC arthroplasty revision 3/21, polio with RLE weakness and leg length discrepency (pt reports RLE ~4" shorter), PD   ? Limitations weakness, lack of coordination, decreased balance, hx of falling, BUE dyskinesia, trunk and cervical dyskinesia   ? Patient Stated Goals "To get stronger."   ?  Currently in Pain? Yes   ? Pain Score 2    ? Pain Location Hip   ? Pain Orientation Right   ? Pain Descriptors / Indicators Aching;Tender   ? Pain Type Chronic pain   ? Pain Onset More than a month ago   ? Pain Frequency Intermittent   ? ?  ?  ? ?  ? ? ?Patient seen for initial instruction of LSVT BIG exercises: LSVT Daily Session Maximal Daily Exercises: Sustained movements are designed to rescale the amplitude of movement output for generalization to daily functional activities. Performed as follows for 1 set of 10 repetitions each: Multi directional sustained movements- 1) Floor to ceiling, 2) Side to side. Multi directional Repetitive movements performed in standing and are designed to provide retraining effort needed for sustained muscle activation in tasks  Performed as follows: 3) Step and reach forward, 4) Step and Reach Backwards, 5) Step and reach sideways, 6) Rock and reach forward/backward, 7) Rock and reach sideways. Sit to stand from mat table on lowest setting with cues for weight shift, technique and CGA for 5 reps for 1 set.    ?With side to side exercise in sitting, therapist provided assistance to physically help move right LE in correct position for exercises.  All standing exercises were performed in ADAPTED version with use of a chair and with min to mod assist from therapist to maintain balance, placing UE and LE in correct positions and to facilitate amplitude of movement patterns.  All exercises were modeled by therapist prior to initiation of each exercise.  Pt requires modifications due to history of polio, right LE brace and balance deficits.   ? ?Discussed functional component tasks and formulated preliminary list, pt to think about tonight and review list tomorrow to form final options.   ?1) sit to stand ?2) manipulation of buttons ?3) stepping down onto porch ?4) reaching in overhead cabinets in kitchen  ?5) Donning watch ? ?Hierarchy Tasks ?Pt indicated she would like to be able to retrieve mail from mailbox at home (a task she performed in the past) ?Requested pt take a picture of her driveway to mailbox to see distance and any slopes and we can discussed again to determine if this goal can be achieved safely.   ? ?Response to tx: ?Pt seen for initial instruction of maximal daily exercises, performed today in the clinic in an adapted version as outlined above.  Did not issue written handouts for use at home yet, she will likely need to perform home exercises from a seated position for safety and then work towards performing in adapted version in the clinic with assist.  Pt with good effort this date with all exercises.  Pt instructed to review list of functional component tasks tonight and report back next date for final list.  Will also  work towards identifying hierarchy tasks.  She would like to be able to check the mail safely, will analyze task further with more information on distance, type of driveway pavement, and presence of any slopes or uneven surfaces.  Continue to work towards goals to improve functional transfers, mobility, balance and greater independence with daily tasks.   ? ? ? ? ? ? ? ? ? ? ? ? ? ? ? ? ? ? ? ? ? OT Education - 04/30/21 1959   ? ? Education Details maximal daily exercises, functional component tasks.   ? Person(s) Educated Patient   ? Methods Explanation   ? Comprehension Verbalized understanding;Verbal  cues required;Need further instruction   ? ?  ?  ? ?  ? ? ? OT Short Term Goals - 04/19/21 1645   ? ?  ? OT SHORT TERM GOAL #1  ? Title Pt will be independent to perform maximial daily exercises.   ? Baseline Eval: Will initiate next session   ? Time 2   ? Period Weeks   ? Status New   ? Target Date 05/03/21   ?  ? OT SHORT TERM GOAL #2  ? Title Pt will manage buttons on shirt and pants with modified indep and extra time.   ? Baseline Eval: Difficult, often requires caregiver assist   ? Time 3   ? Period Weeks   ? Status New   ? Target Date 05/10/21   ? ?  ?  ? ?  ? ? ? ? OT Long Term Goals - 04/19/21 1651   ? ?  ? OT LONG TERM GOAL #1  ? Title Pt will perform shower transfer with modified indep.   ? Baseline Eval: close supv   ? Time 4   ? Period Weeks   ? Status New   ? Target Date 05/18/21   ?  ? OT LONG TERM GOAL #2  ? Title Pt will increase FMC to enable modified indep with fastening necklaces and bracelets.   ? Baseline Eval: spouse assists   ? Time 4   ? Period Weeks   ? Status New   ? Target Date 05/18/21   ?  ? OT LONG TERM GOAL #3  ? Title Pt will be indep to don coat.   ? Baseline Eval: min A   ? Time 4   ? Period Weeks   ? Status New   ? Target Date 05/18/21   ?  ? OT LONG TERM GOAL #4  ? Title Pt will increase balance to enable pt to manage light vacuuming and sweeping with supv.   ? Baseline Eval: would  need to perform in sitting   ? Time 4   ? Period Weeks   ? Target Date 05/18/21   ?  ? OT LONG TERM GOAL #5  ? Title Pt will transport light load of laundry with modified indep.   ? Baseline Eval: spou

## 2021-05-01 ENCOUNTER — Ambulatory Visit: Payer: Medicare Other

## 2021-05-01 DIAGNOSIS — G20 Parkinson's disease: Secondary | ICD-10-CM | POA: Diagnosis not present

## 2021-05-01 DIAGNOSIS — R2681 Unsteadiness on feet: Secondary | ICD-10-CM | POA: Diagnosis not present

## 2021-05-01 DIAGNOSIS — M6281 Muscle weakness (generalized): Secondary | ICD-10-CM

## 2021-05-01 DIAGNOSIS — R278 Other lack of coordination: Secondary | ICD-10-CM | POA: Diagnosis not present

## 2021-05-02 ENCOUNTER — Ambulatory Visit: Payer: Medicare Other

## 2021-05-02 DIAGNOSIS — R278 Other lack of coordination: Secondary | ICD-10-CM

## 2021-05-02 DIAGNOSIS — G20 Parkinson's disease: Secondary | ICD-10-CM | POA: Diagnosis not present

## 2021-05-02 DIAGNOSIS — M6281 Muscle weakness (generalized): Secondary | ICD-10-CM

## 2021-05-02 DIAGNOSIS — R2681 Unsteadiness on feet: Secondary | ICD-10-CM | POA: Diagnosis not present

## 2021-05-02 NOTE — Therapy (Signed)
[facility] ?[place], [place], [address] ?Phone: [phone]   Fax:  [phone] ? ?Occupational Therapy Treatment ? ?Patient Details  ?Name: Susan Shah ?MRN: [id number] ?Date of Birth: [birth year] ?Referring Provider (OT): Dr. Manuella Ghazi ? ? ?Encounter Date: 05/01/2021 ? ? OT End of Session - 05/02/21 0818   ? ? Visit Number 3   ? Number of Visits 17   ? Date for OT Re-Evaluation 05/18/21   ? Authorization Time Period Reporting period beginning 04/18/21   ? OT Start Time 1300   ? OT Stop Time 1400   ? OT Time Calculation (min) 60 min   ? Equipment Utilized During Treatment crutches, transport chair   ? Activity Tolerance Patient tolerated treatment well   ? Behavior During Therapy [facility] for tasks assessed/performed   ? ?  ?  ? ?  ? ? ?Past Medical History:  ?Diagnosis Date  ? Anxiety   ? Asthma 2015  ? mild, seasonal allergy triggered.  ? Cancer [facility]) 2013  ? skin cancer  on left hand  ? Depression   ? Esophageal dysmotility   ? GERD (gastroesophageal reflux disease) 11/05/2012  ? History of hiatal hernia   ? Hyperlipidemia   ? Hypertension   ? Incontinence of urine   ? Lung nodule   ? OA (osteoarthritis)   ? Obesity   ? Parkinson's disease ([place])   ? PONV (postoperative nausea and vomiting)   ? Post-polio syndrome   ? contracted at 2 months old  ? Pulmonary nodule 11/05/2012  ? RML  ? Shingles 2009  ? Sleep apnea   ? ? ?Past Surgical History:  ?Procedure Laterality Date  ? 35 HOUR [place] STUDY N/A 04/24/2016  ? Procedure: [place] STUDY;  Surgeon: Lucilla Lame, MD;  Location: [facility] ENDOSCOPY;  Service: Endoscopy;  Laterality: N/A;  ? ABDOMINAL HYSTERECTOMY    ? Total  ? APPENDECTOMY    ? CARPOMETACARPAL (CMC) FUSION OF THUMB Left 05/16/2016  ? Procedure: CARPOMETACARPAL [facility]) FUSION OF THUMB;  Surgeon: Hessie Knows, MD;  Location: [facility] ORS;  Service: Orthopedics;  Laterality: Left;  ? CARPOMETACARPAL (CMC) FUSION OF THUMB Left 03/30/2019  ? Procedure: LEFT THUMB  SUSPENSION PLASTY;  Surgeon: Hessie Knows, MD;  Location: [facility] ORS;  Service: Orthopedics;  Laterality: Left;  ? CATARACT EXTRACTION Bilateral 12/2012  ? CHOLECYSTECTOMY    ? COLONOSCOPY  2003  ? ESOPHAGEAL MANOMETRY N/A 04/24/2016  ? Procedure: ESOPHAGEAL MANOMETRY (EM);  Surgeon: Lucilla Lame, MD;  Location: [facility] ENDOSCOPY;  Service: Endoscopy;  Laterality: N/A;  ? ESOPHAGOGASTRODUODENOSCOPY  08/2011  ? FOOT SURGERY    ? HALLUX VALGUS CORRECTION    ? HIP SURGERY Right 1950  ? tendon release r/t polio  ? KNEE ARTHROSCOPY Left 06/23/2008  ? RETINAL DETACHMENT SURGERY Right 03/2014  ? ROTATOR CUFF REPAIR Bilateral   ? URINARY SURGERY  2014  ? [place]  ? ? ?There were no vitals filed for this visit. ? ? Subjective Assessment - 05/01/21 0816   ? ? Subjective  "I took some pictures of my driveway like you asked."   ? Pertinent History CMC arthroplasty revision 3/21, polio with RLE weakness and leg length discrepency (pt reports RLE ~4" shorter), PD   ? Limitations weakness, lack of coordination, decreased balance, hx of falling, BUE dyskinesia, trunk and cervical dyskinesia   ? Patient Stated Goals "To get stronger."   ? Currently in Pain? Yes   ? Pain Score  3    ? Pain Location Hip   ? Pain Orientation Right   ? Pain Descriptors / Indicators Aching;Tender   ? Pain Type Chronic pain   ? Pain Onset More than a month ago   ? Pain Frequency Intermittent   ? Aggravating Factors  in the morning, and with using it   ? Pain Relieving Factors rest   ? Effect of Pain on Daily Activities mild pain with functional mobility/ADLs   ? Multiple Pain Sites No   ? ?  ?  ? ?  ?Occupational Therapy Treatment: ?Neuro re-ed: ?Patient seen for instruction of LSVT BIG exercises: LSVT Daily Session Maximal Daily Exercises: Sustained movements are designed to rescale the amplitude of movement output for generalization to daily functional activities. Performed as follows for 1 set of 10 repetitions each: Multi directional sustained movements-  1) Floor to ceiling, 2) Side to side. Multi directional Repetitive movements performed in standing and are designed to provide retraining effort needed for sustained muscle activation in tasks Performed as follows: 3) Step and reach forward, 4) Step and Reach Backwards, 5) Step and reach sideways, 6) Rock and reach forward/backward, 7) Rock and reach sideways. Sit to stand from mat table on lowest setting with cues for weight shift, technique and CGA for 5 reps for 1 set.    ?With side to side exercise in sitting, therapist provided assistance to physically help move right LE in correct position for exercises.  All standing exercises were performed in ADAPTED version with use of a chair and with min A from therapist to maintain balance, placing UE and LE in correct positions and to facilitate amplitude of movement patterns.  Pt required mod-max tactile cues for sustaining big arm movements throughout, maintaining neutral head position and erect sitting and standing posture.  Pt struggled to coordinate arm and leg movements simultaneously, requiring mod-max tactile cues for the moving arm and leg on each side.  All exercises were modeled by therapist prior to initiation of each exercise.  Pt requires modifications due to history of polio, right LE brace and balance deficits.   ?  ?Reviewed list of functional component tasks and pt in agreement with final list.   ?1) sit to stand ?2) manipulation of buttons ?3) stepping down onto porch ?4) reaching in overhead cabinets in kitchen  ?5) Donning watch ?  ?Self Care: ?Practiced buttoning/unbuttoning with large and small buttons on a shirt.  Pt requires extra time for small buttons, and min vc to keep eyes focussed on buttons as pt tended to look up intermittently using only tactile sense.   ?Hierarchy Tasks ?Pt took pictures as recommended by OT of her ramp, driveway, and mailbox.  OT estimates distance to be approximately  200-250 ft from top of ramp to mailbox 1 way,  but will confirm with spouse.  Practiced big walking functional distances in clinic to simulate walk to mailbox, completing 130 ft today with good tolerance; pt demonstrated adequate step length with OT providing positive reinforcement to maintain BIG strides throughout.  OT encouraged pt start walking with supv from spouse down driveway to her tolerance daily, working up to entire distance to [employer] and back with supv to work towards pt's goal of being able to check the mail. Encouraged pt to stop at end of driveway and not yet reach mailbox as there is a grassy dip between mailbox and roadside which would increase fall risk.  Pt verbalized understanding.   ? ?Response to tx: ?Pt  reports good tolerance to exercises from previous day, with no significant increase in R hip pain.  Pt demonstrated good tolerance to exercises this day with frequent rest breaks and adaptations with use of chair for stability for standing exercises.  Printed written handouts for use at home yet but additional training needed so OT did not send home with pt this day. Plan to perform home exercises from a seated position next session as OT would like for pt to perform seated at home, working towards performing in adapted version in the clinic with assist.  Pt with good effort this date with all exercises.  Pt in agreement to start walking partial distance to mail box daily with supv from spouse, working up to full distance with supv.  Continue to work towards goals to improve functional transfers, mobility, balance and greater independence with daily tasks.   ? ? ? OT Education - 05/01/21 0817   ? ? Education Details maximal daily exercises, functional component tasks   ? Person(s) Educated Patient   ? Methods Explanation   ? Comprehension Verbalized understanding;Verbal cues required;Need further instruction;Returned demonstration;Tactile cues required   ? ?  ?  ? ?  ? ? ? OT Short Term Goals - 04/19/21 1645   ? ?  ? OT SHORT TERM GOAL #1   ? Title Pt will be independent to perform maximial daily exercises.   ? Baseline Eval: Will initiate next session   ? Time 2   ? Period Weeks   ? Status New   ? Target Date 05/03/21   ?  ? OT SHORT TE

## 2021-05-03 ENCOUNTER — Ambulatory Visit: Payer: Medicare Other

## 2021-05-03 NOTE — Therapy (Signed)
[place] ?[place], [place], [address] ?Phone: [phone]   Fax:  [phone] ? ?Occupational Therapy Treatment ? ?Patient Details  ?Name: Susan Shah ?MRN: [id number] ?Date of Birth: 22-May-1945 ?Referring Provider (OT): Dr. Manuella Ghazi ? ? ?Encounter Date: 05/02/2021 ? ? OT End of Session - 05/03/21 1113   ? ? Visit Number 4   ? Number of Visits 17   ? Date for OT Re-Evaluation 05/18/21   ? Authorization Time Period Reporting period beginning 04/18/21   ? OT Start Time 1300   ? OT Stop Time 1400   ? OT Time Calculation (min) 60 min   ? Equipment Utilized During Treatment crutches, transport chair   ? Activity Tolerance Patient tolerated treatment well   ? Behavior During Therapy [facility] for tasks assessed/performed   ? ?  ?  ? ?  ? ? ?Past Medical History:  ?Diagnosis Date  ? Anxiety   ? Asthma 2015  ? mild, seasonal allergy triggered.  ? Cancer [facility]) 2013  ? skin cancer  on left hand  ? Depression   ? Esophageal dysmotility   ? GERD (gastroesophageal reflux disease) 11/05/2012  ? History of hiatal hernia   ? Hyperlipidemia   ? Hypertension   ? Incontinence of urine   ? Lung nodule   ? OA (osteoarthritis)   ? Obesity   ? Parkinson's disease ([place])   ? PONV (postoperative nausea and vomiting)   ? Post-polio syndrome   ? contracted at 52 months old  ? Pulmonary nodule 11/05/2012  ? RML  ? Shingles 2009  ? Sleep apnea   ? ? ?Past Surgical History:  ?Procedure Laterality Date  ? 93 HOUR [place] STUDY N/A 04/24/2016  ? Procedure: [place] STUDY;  Surgeon: Lucilla Lame, MD;  Location: [facility] ENDOSCOPY;  Service: Endoscopy;  Laterality: N/A;  ? ABDOMINAL HYSTERECTOMY    ? Total  ? APPENDECTOMY    ? CARPOMETACARPAL (CMC) FUSION OF THUMB Left 05/16/2016  ? Procedure: CARPOMETACARPAL [facility]) FUSION OF THUMB;  Surgeon: Hessie Knows, MD;  Location: [facility] ORS;  Service: Orthopedics;  Laterality: Left;  ? CARPOMETACARPAL (CMC) FUSION OF THUMB Left 03/30/2019  ? Procedure: LEFT THUMB  SUSPENSION PLASTY;  Surgeon: Hessie Knows, MD;  Location: [facility] ORS;  Service: Orthopedics;  Laterality: Left;  ? CATARACT EXTRACTION Bilateral 12/2012  ? CHOLECYSTECTOMY    ? COLONOSCOPY  2003  ? ESOPHAGEAL MANOMETRY N/A 04/24/2016  ? Procedure: ESOPHAGEAL MANOMETRY (EM);  Surgeon: Lucilla Lame, MD;  Location: [facility] ENDOSCOPY;  Service: Endoscopy;  Laterality: N/A;  ? ESOPHAGOGASTRODUODENOSCOPY  08/2011  ? FOOT SURGERY    ? HALLUX VALGUS CORRECTION    ? HIP SURGERY Right 1950  ? tendon release r/t polio  ? KNEE ARTHROSCOPY Left 06/23/2008  ? RETINAL DETACHMENT SURGERY Right 03/2014  ? ROTATOR CUFF REPAIR Bilateral   ? URINARY SURGERY  2014  ? [place]  ? ? ?There were no vitals filed for this visit. ? ? Subjective Assessment - 05/02/21 1110   ? ? Subjective  "My neck has been sore over the last couple of weeks."   ? Pertinent History CMC arthroplasty revision 3/21, polio with RLE weakness and leg length discrepency (pt reports RLE ~4" shorter), PD   ? Limitations weakness, lack of coordination, decreased balance, hx of falling, BUE dyskinesia, trunk and cervical dyskinesia   ? Patient Stated Goals "To get stronger."   ? Currently in Pain? Yes   ? Pain  Score 3    ? Pain Location Hip   ? Pain Orientation Right   ? Pain Descriptors / Indicators Aching;Tender   ? Pain Type Chronic pain   ? Pain Onset More than a month ago   ? Pain Frequency Intermittent   ? Aggravating Factors  in the morning, and with using it   ? Pain Relieving Factors rest   ? Effect of Pain on Daily Activities mild pain with functional mobility/ADLs   ? Multiple Pain Sites Yes   ? Pain Score 5   ? Pain Location Neck   ? Pain Orientation Right;Posterior   ? Pain Descriptors / Indicators Sore   ? Pain Type Acute pain   ? Pain Onset 1 to 4 weeks ago   ? Pain Frequency Intermittent   ? Aggravating Factors  N/A   ? Pain Relieving Factors rest   ? Effect of Pain on Daily Activities general soreness   ? ?  ?  ? ?  ?Neuro re-ed: ?Patient seen for instruction  of LSVT BIG exercises: LSVT Daily Session Seated Maximal Daily Exercises: Sustained movements are designed to rescale the amplitude of movement output for generalization to daily functional activities. Performed as follows for 1 set of 10 repetitions each: Multi directional sustained movements- 1) Floor to ceiling, 2) Side to side. Multi directional Repetitive movements performed in standing and are designed to provide retraining effort needed for sustained muscle activation in tasks Performed as follows: 3) Step and reach forward, 4) Step and Reach Backwards, 5) Step and reach sideways, 6) Rock and reach forward/backward, 7) Rock and reach sideways; performed all in seated position as OT is recommending to perform these seated at home to maximize indep and safety, working towards completion in standing. Performed sit to stand from mat table on lowest setting with cues for weight shift, technique, sequencing, and CGA for 5 reps for 1 set.  Chair placed in front for unilateral support upon standing.  With side to side exercise in sitting, therapist provided assistance to physically help move right LE in correct position for exercises.  Pt required mod-max tactile cues for sustaining big arm movements throughout, maintaining neutral head position and erect sitting posture.  Pt struggles with reciprocal arm and legs movements, requiring mod-max tactile cues correct movement patterns.  Pt requires modifications due to history of polio, RLE brace and balance deficits.  All exercises were modeled by therapist prior to initiation of each exercise.  After completion of 1 set of 10 for each exercise, OT had pt go through handout to demo each exercise x1-2 reps each, pt requiring mod vc to accurately perform each exercise with handout.   ? ?Self Care: ?Functional Component tasks:   ?1) sit to stand x5 reps ?2) manipulation of buttons: x3 reps for 3 small buttons, min vc to focus eyes on buttons throughout task ?3) stepping  down onto porch: (did not perform d/t time constraints) ?4) reaching in overhead cabinets in kitchen: (did not perform d/t time constraints ?5) Donning/clasping watch: x2 reps, extra time needed for both trials, 1 trial requiring min A.  Made recommendation for bringing watch and other jewelry pt frequently wears to a jeweler to see if clasps can be replaced with magnetic clasps.  Pt receptive. ?  ?Hierarchy Tasks ?Practiced big walking functional distances in clinic to simulate walk to mailbox, completing 300 ft today with 1 standing rest break.  Pt demonstrated adequate step length with OT providing positive reinforcement to maintain BIG  strides throughout.  OT had encouraged pt start walking with supv from spouse down driveway to her tolerance daily, working up to entire distance to [employer] and back with supv to work towards pt's goal of being able to check the mail.  Pt reports she was quite tired yesterday after therapy and has not yet walked the driveway at home with spouse.  ?  ?Response to tx: ?Pt reported some fatigue today after therapy yesterday. Pt reported neck pain this day at 5/10 that she hadn't reported in previous sessions, but states she's had it for maybe 2 weeks.  OT encouraged pt try a heating pad at home x20 min for pain management/muscle relaxation, and encouraged gentle cervical ROM with OT providing demonstration.  Pt demonstrated good tolerance to seated exercises this day with frequent rest breaks and adaptations with use of chair for additional stability for side to side reaching exercises.  Issued written handouts for use at home for seated exercises and sit to stand reps.  Advised pt that standing exercises will continue to be reviewed and completed in the clinic with OT, and reiterated that pt should perform seated exercises at home working up to 2x daily on non therapy days, and 1x daily on therapy days.  Pt continues with good effort this date with all exercises.  Will plan walking  trials outside (uneven surfaces) and stepping down from a curb or step to simulate step down to porch next session.  Continue to work towards goals to improve functional transfers, mobility, balance a

## 2021-05-07 ENCOUNTER — Ambulatory Visit: Payer: Medicare Other

## 2021-05-07 DIAGNOSIS — G20 Parkinson's disease: Secondary | ICD-10-CM

## 2021-05-07 DIAGNOSIS — M6281 Muscle weakness (generalized): Secondary | ICD-10-CM

## 2021-05-07 DIAGNOSIS — R2681 Unsteadiness on feet: Secondary | ICD-10-CM | POA: Diagnosis not present

## 2021-05-07 DIAGNOSIS — R278 Other lack of coordination: Secondary | ICD-10-CM | POA: Diagnosis not present

## 2021-05-08 ENCOUNTER — Ambulatory Visit: Payer: Medicare Other

## 2021-05-08 DIAGNOSIS — G20 Parkinson's disease: Secondary | ICD-10-CM | POA: Diagnosis not present

## 2021-05-08 DIAGNOSIS — M6281 Muscle weakness (generalized): Secondary | ICD-10-CM | POA: Diagnosis not present

## 2021-05-08 DIAGNOSIS — R2681 Unsteadiness on feet: Secondary | ICD-10-CM

## 2021-05-08 DIAGNOSIS — R278 Other lack of coordination: Secondary | ICD-10-CM
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
## 2021-05-09 ENCOUNTER — Ambulatory Visit: Payer: Medicare Other | Admitting: Occupational Therapy

## 2021-05-09 ENCOUNTER — Encounter: Payer: Self-pay | Admitting: Occupational Therapy

## 2021-05-09 DIAGNOSIS — M6281 Muscle weakness (generalized): Secondary | ICD-10-CM

## 2021-05-09 DIAGNOSIS — R278 Other lack of coordination: Secondary | ICD-10-CM

## 2021-05-09 DIAGNOSIS — R2681 Unsteadiness on feet: Secondary | ICD-10-CM | POA: Diagnosis not present

## 2021-05-09 DIAGNOSIS — G20 Parkinson's disease: Secondary | ICD-10-CM | POA: Diagnosis not present
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
## 2021-05-10 ENCOUNTER — Ambulatory Visit: Payer: Medicare Other

## 2021-05-10 DIAGNOSIS — M6281 Muscle weakness (generalized): Secondary | ICD-10-CM | POA: Diagnosis not present

## 2021-05-10 DIAGNOSIS — G20 Parkinson's disease: Secondary | ICD-10-CM | POA: Diagnosis not present

## 2021-05-10 DIAGNOSIS — R278 Other lack of coordination: Secondary | ICD-10-CM | POA: Diagnosis not present

## 2021-05-10 DIAGNOSIS — R2681 Unsteadiness on feet: Secondary | ICD-10-CM
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
## 2021-05-14 ENCOUNTER — Ambulatory Visit: Payer: Medicare Other

## 2021-05-14 DIAGNOSIS — R2681 Unsteadiness on feet: Secondary | ICD-10-CM | POA: Diagnosis not present

## 2021-05-14 DIAGNOSIS — R278 Other lack of coordination: Secondary | ICD-10-CM | POA: Diagnosis not present

## 2021-05-14 DIAGNOSIS — M6281 Muscle weakness (generalized): Secondary | ICD-10-CM | POA: Diagnosis not present

## 2021-05-14 DIAGNOSIS — G20 Parkinson's disease: Secondary | ICD-10-CM | POA: Diagnosis not present

## 2021-05-15 ENCOUNTER — Ambulatory Visit: Payer: Medicare Other

## 2021-05-15 DIAGNOSIS — G20 Parkinson's disease: Secondary | ICD-10-CM

## 2021-05-15 DIAGNOSIS — M6281 Muscle weakness (generalized): Secondary | ICD-10-CM | POA: Diagnosis not present

## 2021-05-15 DIAGNOSIS — R2681 Unsteadiness on feet: Secondary | ICD-10-CM

## 2021-05-15 DIAGNOSIS — R278 Other lack of coordination: Secondary | ICD-10-CM | POA: Diagnosis not present
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
## 2021-05-16 ENCOUNTER — Ambulatory Visit: Payer: Medicare Other

## 2021-05-16 DIAGNOSIS — R278 Other lack of coordination: Secondary | ICD-10-CM | POA: Diagnosis not present

## 2021-05-16 DIAGNOSIS — G20 Parkinson's disease: Secondary | ICD-10-CM

## 2021-05-16 DIAGNOSIS — R2681 Unsteadiness on feet: Secondary | ICD-10-CM

## 2021-05-16 DIAGNOSIS — M6281 Muscle weakness (generalized): Secondary | ICD-10-CM | POA: Diagnosis not present
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
## 2021-05-17 ENCOUNTER — Ambulatory Visit: Payer: Medicare Other

## 2021-05-17 DIAGNOSIS — R278 Other lack of coordination: Secondary | ICD-10-CM | POA: Diagnosis not present

## 2021-05-17 DIAGNOSIS — G20 Parkinson's disease: Secondary | ICD-10-CM | POA: Diagnosis not present

## 2021-05-17 DIAGNOSIS — M6281 Muscle weakness (generalized): Secondary | ICD-10-CM | POA: Diagnosis not present

## 2021-05-17 DIAGNOSIS — R2681 Unsteadiness on feet: Secondary | ICD-10-CM
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
## 2021-05-21 ENCOUNTER — Ambulatory Visit: Payer: Medicare Other | Attending: Neurology

## 2021-05-21 DIAGNOSIS — R278 Other lack of coordination: Secondary | ICD-10-CM | POA: Diagnosis not present

## 2021-05-21 DIAGNOSIS — M6281 Muscle weakness (generalized): Secondary | ICD-10-CM | POA: Diagnosis not present

## 2021-05-21 DIAGNOSIS — G20 Parkinson's disease: Secondary | ICD-10-CM | POA: Insufficient documentation

## 2021-05-21 DIAGNOSIS — R2681 Unsteadiness on feet: Secondary | ICD-10-CM | POA: Insufficient documentation

## 2021-05-21 DIAGNOSIS — R262 Difficulty in walking, not elsewhere classified: Secondary | ICD-10-CM | POA: Diagnosis not present

## 2021-05-22 ENCOUNTER — Ambulatory Visit: Payer: Medicare Other | Admitting: Nurse Practitioner

## 2021-05-22 NOTE — Therapy (Signed)
[place] [facility] ?[place], [place], [address] ?Phone: [phone]   Fax:  [phone] ? ?Occupational Therapy Treatment ? ?Patient Details  ?Name: Susan Shah ?MRN: [id number] ?Date of Birth: 1945-10-14 ?Referring Provider (OT): Dr. Manuella Ghazi ? ? ?Encounter Date: 05/21/2021 ? ? OT End of Session - 05/22/21 1456   ? ? Visit Number 13   ? Number of Visits 17   ? Date for OT Re-Evaluation 05/25/21   ? Authorization Time Period Reporting period beginning 04/18/21   ? OT Start Time 1300   ? OT Stop Time 1400   ? OT Time Calculation (min) 60 min   ? Equipment Utilized During Treatment crutches, transport chair   ? Activity Tolerance Patient tolerated treatment well   ? Behavior During Therapy [facility] for tasks assessed/performed   ? ?  ?  ? ?  ? ? ?Past Medical History:  ?Diagnosis Date  ? Anxiety   ? Asthma 2015  ? mild, seasonal allergy triggered.  ? Cancer [facility]) 2013  ? skin cancer  on left hand  ? Depression   ? Esophageal dysmotility   ? GERD (gastroesophageal reflux disease) 11/05/2012  ? History of hiatal hernia   ? Hyperlipidemia   ? Hypertension   ? Incontinence of urine   ? Lung nodule   ? OA (osteoarthritis)   ? Obesity   ? Parkinson's disease ([place])   ? PONV (postoperative nausea and vomiting)   ? Post-polio syndrome   ? contracted at 63 months old  ? Pulmonary nodule 11/05/2012  ? RML  ? Shingles 2009  ? Sleep apnea   ? ? ?Past Surgical History:  ?Procedure Laterality Date  ? 56 HOUR [place] STUDY N/A 04/24/2016  ? Procedure: [place] STUDY;  Surgeon: Lucilla Lame, MD;  Location: [facility] ENDOSCOPY;  Service: Endoscopy;  Laterality: N/A;  ? ABDOMINAL HYSTERECTOMY    ? Total  ? APPENDECTOMY    ? CARPOMETACARPAL (CMC) FUSION OF THUMB Left 05/16/2016  ? Procedure: CARPOMETACARPAL [facility]) FUSION OF THUMB;  Surgeon: Hessie Knows, MD;  Location: [facility] ORS;  Service: Orthopedics;  Laterality: Left;  ? CARPOMETACARPAL (CMC) FUSION OF THUMB Left 03/30/2019  ? Procedure: LEFT THUMB  SUSPENSION PLASTY;  Surgeon: Hessie Knows, MD;  Location: [facility] ORS;  Service: Orthopedics;  Laterality: Left;  ? CATARACT EXTRACTION Bilateral 12/2012  ? CHOLECYSTECTOMY    ? COLONOSCOPY  2003  ? ESOPHAGEAL MANOMETRY N/A 04/24/2016  ? Procedure: ESOPHAGEAL MANOMETRY (EM);  Surgeon: Lucilla Lame, MD;  Location: [facility] ENDOSCOPY;  Service: Endoscopy;  Laterality: N/A;  ? ESOPHAGOGASTRODUODENOSCOPY  08/2011  ? FOOT SURGERY    ? HALLUX VALGUS CORRECTION    ? HIP SURGERY Right 1950  ? tendon release r/t polio  ? KNEE ARTHROSCOPY Left 06/23/2008  ? RETINAL DETACHMENT SURGERY Right 03/2014  ? ROTATOR CUFF REPAIR Bilateral   ? URINARY SURGERY  2014  ? [place]  ? ? ?There were no vitals filed for this visit. ? ? Subjective Assessment - 05/21/21 1438   ? ? Subjective  Pt inquired about additional therapy after completion of LSVT Big program to continue to address balance.   ? Pertinent History CMC arthroplasty revision 3/21, polio with RLE weakness and leg length discrepency (pt reports RLE ~4" shorter), PD   ? Limitations weakness, lack of coordination, decreased balance, hx of falling, BUE dyskinesia, trunk and cervical dyskinesia   ? Patient Stated Goals "To get stronger."   ? Currently in Pain?  No/denies   ? Pain Score 0-No pain   ? Pain Onset 1 to 4 weeks ago   ? Pain Onset 1 to 4 weeks ago   ? ?  ?  ? ?  ? ?Occupational Therapy Treatment/Progress Note: ?Neuro re-ed: ?Patient seen for instruction of LSVT BIG exercises: LSVT Daily Session Standing Maximal Daily Exercises: Sustained movements are designed to rescale the amplitude of movement output for generalization to daily functional activities. Performed as follows for 1 set of 15 repetitions each: Multi directional sustained movements- 1) Floor to ceiling (seated), 2) Side to side (seated). Multi directional Repetitive movements performed in standing (adapted with 1 hand on chair for support in standing) and are designed to provide retraining effort needed for  sustained muscle activation in tasks performed as follows: 3) Step and reach forward, 4) Step and Reach Backwards, 5) Step and reach sideways, 6) Rock and reach forward/backward, 7) Rock and reach sideways.  Performed sit to stand from mat table on lowest setting with cues for weight shift, technique, sequencing, and supv for 5 reps.  Pt required 1 tactile cue for sustaining big arm movements throughout, min vc for maintaining neutral head position; erect sitting posture improving with minimal prompting.  Pt requires modifications due to history of polio, RLE brace and balance deficits.  Pt able to initiate all maximal daily exercises without therapist first modeling and following written handout.  ? ?Self Care: ?Functional Component tasks:   ?1) sit to stand x5 reps; intermittent min vc for technique for forward weight shifting; some difficulty with weight shift onto right LE (polio affected leg and built up shoe). Min guard for 1 sit to stand d/t slight LOB but recovered with min guard.  Chair placed in front for unilateral support upon standing. ?2) manipulation of buttons: x5 reps for jeans button in standing; pt initiated 10 finger flicks prior to buttons to improve performance and facilitating "big" hand movements ?3) stepping down onto porch: did not complete today d/t time constraints with focus on jewelry. ?4) reaching in overhead cabinets in kitchen: did not complete today d/t time constraints with focus on jewelry. ?5) Donning/clasping watch: x1 successful watch clasp.  Pt performed 10 finger flicks before and between reps.  Extra time and multiple trials needed for successful clasping.  Pt worked with clasping necklace and 2 bracelets, but required assist for all 3.  Pt contacted her jeweler for pricing to change to magnetic clasps.  Pt thinks she may pick 1-2 items to change clasps as this service is a little pricey.  ?  ?Hierarchy Tasks: Checking mail ?Pt walked with crutches from therapy gym to  upstairs main entrance, standing rest break only in elevator with good tolerance.  Pt typically uses transport chair.  Functional distances continue to improve.   ?  ?Response to tx: ?Pt is tolerating all standing exercises well in the adapted position (with 1 hand on chair for support) and was able to again tolerate 15 reps for all exercises this date.  Pt is requiring fewer cues but required 3-4 seated rest breaks this date to complete the designated standing maximal daily exercises.  Pt was able to initiate all 7 exercises correctly by following written handout, without therapist first modeling.  Although pt reports she continues to feel stronger and more steady, pt inquired about additional therapy to continue to address her balance after she completes her LSVT Big program at the end of this week as pt still has a fear of falling.  OT recommended additional PT to focus on LE strength and balance and will fax script to Dr. Manuella Ghazi.  Continue to work towards goals in plan of care to improve balance, safety, functional mobility, as well as improve independence in functional daily ADL and IADL tasks. ? ? ? OT Education - 05/21/21 1456   ? ? Education Details standing maximal daily exercises   ? Person(s) Educated Patient   ? Methods Explanation;Verbal cues;Demonstration;Tactile cues   ? Comprehension Verbalized understanding;Verbal cues required;Need further instruction;Returned demonstration;Tactile cues required   ? ?  ?  ? ?  ? ? ? OT Short Term Goals - 05/15/21 1130   ? ?  ? OT SHORT TERM GOAL #1  ? Title Pt will be independent to perform maximial daily exercises.   ? Baseline Eval: Will initiate next session; 05/15/21: performs with mod vc, min tactile cues   ? Time 2   ? Period Weeks   ? Status On-going   ? Target Date 05/03/21   ?  ? OT SHORT TERM GOAL #2  ? Title Pt will manage buttons on shirt and pants with modified indep and extra time.   ? Baseline Eval: Difficult, often requires caregiver assist; 05/15/21:  modified indep with shirt buttons, min A with pant buttons   ? Time 3   ? Period Weeks   ? Status On-going   ? Target Date 05/10/21   ? ?  ?  ? ?  ? ? ? ? OT Long Term Goals - 05/15/21 1132   ? ?  ? OT LONG TERM GO

## 2021-05-23 ENCOUNTER — Ambulatory Visit: Payer: Medicare Other

## 2021-05-23 DIAGNOSIS — R278 Other lack of coordination: Secondary | ICD-10-CM | POA: Diagnosis not present

## 2021-05-23 DIAGNOSIS — R2681 Unsteadiness on feet: Secondary | ICD-10-CM

## 2021-05-23 DIAGNOSIS — G20 Parkinson's disease: Secondary | ICD-10-CM

## 2021-05-23 DIAGNOSIS — M6281 Muscle weakness (generalized): Secondary | ICD-10-CM | POA: Diagnosis not present

## 2021-05-23 DIAGNOSIS — R262 Difficulty in walking, not elsewhere classified: Secondary | ICD-10-CM | POA: Diagnosis not present

## 2021-05-24 ENCOUNTER — Ambulatory Visit: Payer: Medicare Other

## 2021-05-24 DIAGNOSIS — R2681 Unsteadiness on feet: Secondary | ICD-10-CM | POA: Diagnosis not present

## 2021-05-24 DIAGNOSIS — R278 Other lack of coordination: Secondary | ICD-10-CM

## 2021-05-24 DIAGNOSIS — G20 Parkinson's disease: Secondary | ICD-10-CM | POA: Diagnosis not present

## 2021-05-24 DIAGNOSIS — R262 Difficulty in walking, not elsewhere classified: Secondary | ICD-10-CM | POA: Diagnosis not present

## 2021-05-24 DIAGNOSIS — M6281 Muscle weakness (generalized): Secondary | ICD-10-CM | POA: Diagnosis not present

## 2021-05-24 NOTE — Therapy (Signed)
[place] [facility] ?[place], [place], [address] ?Phone: [phone]   Fax:  [phone] ? ?Occupational Therapy Treatment ? ?Patient Details  ?Name: Susan Shah ?MRN: [id number] ?Date of Birth: 1945/03/24 ?Referring Provider (OT): Dr. Manuella Ghazi ? ? ?Encounter Date: 05/23/2021 ? ? OT End of Session - 05/24/21 1549   ? ? Visit Number 14   ? Number of Visits 17   ? Date for OT Re-Evaluation 05/25/21   ? Authorization Time Period Reporting period beginning 04/18/21   ? OT Start Time 1300   ? OT Stop Time 1400   ? OT Time Calculation (min) 60 min   ? Equipment Utilized During Treatment crutches, transport chair   ? Activity Tolerance Patient tolerated treatment well   ? Behavior During Therapy [facility] for tasks assessed/performed   ? ?  ?  ? ?  ? ? ?Past Medical History:  ?Diagnosis Date  ? Anxiety   ? Asthma 2015  ? mild, seasonal allergy triggered.  ? Cancer [facility]) 2013  ? skin cancer  on left hand  ? Depression   ? Esophageal dysmotility   ? GERD (gastroesophageal reflux disease) 11/05/2012  ? History of hiatal hernia   ? Hyperlipidemia   ? Hypertension   ? Incontinence of urine   ? Lung nodule   ? OA (osteoarthritis)   ? Obesity   ? Parkinson's disease ([place])   ? PONV (postoperative nausea and vomiting)   ? Post-polio syndrome   ? contracted at 27 months old  ? Pulmonary nodule 11/05/2012  ? RML  ? Shingles 2009  ? Sleep apnea   ? ? ?Past Surgical History:  ?Procedure Laterality Date  ? 2 HOUR [place] STUDY N/A 04/24/2016  ? Procedure: [place] STUDY;  Surgeon: Lucilla Lame, MD;  Location: [facility] ENDOSCOPY;  Service: Endoscopy;  Laterality: N/A;  ? ABDOMINAL HYSTERECTOMY    ? Total  ? APPENDECTOMY    ? CARPOMETACARPAL (CMC) FUSION OF THUMB Left 05/16/2016  ? Procedure: CARPOMETACARPAL [facility]) FUSION OF THUMB;  Surgeon: Hessie Knows, MD;  Location: [facility] ORS;  Service: Orthopedics;  Laterality: Left;  ? CARPOMETACARPAL (CMC) FUSION OF THUMB Left 03/30/2019  ? Procedure: LEFT THUMB  SUSPENSION PLASTY;  Surgeon: Hessie Knows, MD;  Location: [facility] ORS;  Service: Orthopedics;  Laterality: Left;  ? CATARACT EXTRACTION Bilateral 12/2012  ? CHOLECYSTECTOMY    ? COLONOSCOPY  2003  ? ESOPHAGEAL MANOMETRY N/A 04/24/2016  ? Procedure: ESOPHAGEAL MANOMETRY (EM);  Surgeon: Lucilla Lame, MD;  Location: [facility] ENDOSCOPY;  Service: Endoscopy;  Laterality: N/A;  ? ESOPHAGOGASTRODUODENOSCOPY  08/2011  ? FOOT SURGERY    ? HALLUX VALGUS CORRECTION    ? HIP SURGERY Right 1950  ? tendon release r/t polio  ? KNEE ARTHROSCOPY Left 06/23/2008  ? RETINAL DETACHMENT SURGERY Right 03/2014  ? ROTATOR CUFF REPAIR Bilateral   ? URINARY SURGERY  2014  ? [place]  ? ? ?There were no vitals filed for this visit. ? ? Subjective Assessment - 05/23/21 1547   ? ? Subjective  Pt reports feeling like she may be getting a cold.   ? Pertinent History CMC arthroplasty revision 3/21, polio with RLE weakness and leg length discrepency (pt reports RLE ~4" shorter), PD   ? Limitations weakness, lack of coordination, decreased balance, hx of falling, BUE dyskinesia, trunk and cervical dyskinesia   ? Patient Stated Goals "To get stronger."   ? Currently in Pain? No/denies   ? Pain Score  0-No pain   ? Pain Onset 1 to 4 weeks ago   ? Pain Onset 1 to 4 weeks ago   ? ?  ?  ? ?  ?Occupational Therapy Treatment/Progress Note: ?Neuro re-ed: ?Patient seen for instruction of LSVT BIG exercises: LSVT Daily Session Standing Maximal Daily Exercises: Sustained movements are designed to rescale the amplitude of movement output for generalization to daily functional activities. Performed as follows for 1 set of 10 repetitions each: Multi directional sustained movements- 1) Floor to ceiling (seated), 2) Side to side (seated). Multi directional Repetitive movements performed in standing (adapted with 1 hand on chair for support in standing) and are designed to provide retraining effort needed for sustained muscle activation in tasks performed as follows: 3)  Step and reach forward, 4) Step and Reach Backwards, 5) Step and reach sideways, 6) Rock and reach forward/backward, 7) Rock and reach sideways.  Performed sit to stand from mat table on lowest setting with cues for weight shift, technique, and supv for 5 reps.  Pt required min tactile cues for sustaining big arm movements throughout and sustaining erect sitting and standing posture.  Pt requires modifications due to history of polio, RLE brace and balance deficits.  Pt able to initiate all maximal daily exercises without therapist first modeling and following written handout.  ?  ?Self Care: ?Functional Component tasks:   ?1) sit to stand x5 reps; min vc for technique for forward weight shifting; some difficulty with weight shift onto right LE (polio affected leg and built up shoe).  Chair placed in front for unilateral support upon standing. ?2) manipulation of buttons: x5 reps for jeans button in standing; pt initiated 10 finger flicks prior to buttons to improve performance and facilitating "big" hand movements.  Pt performing with less effort, but still a little extra time. ?3) stepping down onto porch: practiced stepping up and down x5 reps on a stair; used handrail and 1 crutch with SBA. ?4) reaching in overhead cabinets in kitchen: simulated with cup sized item reaching from counter level to 2nd shelf x3 reps each hand, unilateral support with 1 hand on countertop.  ?5) Donning/clasping watch: Pt did not have her jewelry or watch with her today but simulated this task with practice snapping and unsnapping beads together.  More difficulty with snapping d/t hand weakness, but good ability to unsnap. ?  ?Hierarchy Tasks: Checking mail ?Pt walked 6 min (did not measure distance this day) with cue for 1 standing rest break, bracing self up against wall to rest BUEs as arms were fatigued from grasping crutches.  OT continues to encourage frequent walking at home to continue to build activity tolerance for  community mobility and checking mail.  ?  ?Response to tx: ?Pt came in with a mask today, stating that she felt like she was either coming down with a cold or possibly an allergy flare up.  Down graded repetitions today to 10 reps for all maximal daily exercises d/t pt feeling more under the weather.  Pt continues to tolerate all standing exercises well in the adapted position (with 1 hand on chair for support).  Pt is requiring fewer cues but required more frequent rest breaks this date d/t not feeling her best with a possible cold.  OT encouraged pt try to rest and go to bed early if able, continue to hydrate.  Pt verbalized understanding.  Continue to work towards goals in plan of care to improve balance, safety, functional mobility, as well as  improve independence in functional daily ADL and IADL tasks. ? ? ? OT Education - 05/23/21 1549   ? ? Education Details standing maximal daily exercises   ? Person(s) Educated Patient   ? Methods Explanation;Verbal cues;Demonstration;Tactile cues   ? Comprehension Verbalized understanding;Verbal cues required;Need further instruction;Returned demonstration;Tactile cues required   ? ?  ?  ? ?  ? ? ? OT Short Term Goals - 05/15/21 1130   ? ?  ? OT SHORT TERM GOAL #1  ? Title Pt will be independent to perform maximial daily exercises.   ? Baseline Eval: Will initiate next session; 05/15/21: performs with mod vc, min tactile cues   ? Time 2   ? Period Weeks   ? Status On-going   ? Target Date 05/03/21   ?  ? OT SHORT TERM GOAL #2  ? Title Pt will manage buttons on shirt and pants with modified indep and extra time.   ? Baseline Eval: Difficult, often requires caregiver assist; 05/15/21: modified indep with shirt buttons, min A with pant buttons   ? Time 3   ? Period Weeks   ? Status On-going   ? Target Date 05/10/21   ? ?  ?  ? ?  ? ? ? ? OT Long Term Goals - 05/15/21 1132   ? ?  ? OT LONG TERM GOAL #1  ? Title Pt will perform shower transfer with modified indep.   ? Baseline  Eval: close supv; 05/15/21: close supv   ? Time 4   ? Period Weeks   ? Status On-going   ? Target Date 05/25/21   ?  ? OT LONG TERM GOAL #2  ? Title Pt will increase [place] to enable modified indep with fastening [place]

## 2021-05-25 ENCOUNTER — Ambulatory Visit: Payer: Medicare Other

## 2021-05-25 DIAGNOSIS — G20 Parkinson's disease: Secondary | ICD-10-CM | POA: Diagnosis not present

## 2021-05-25 DIAGNOSIS — R2681 Unsteadiness on feet: Secondary | ICD-10-CM | POA: Diagnosis not present

## 2021-05-25 DIAGNOSIS — M6281 Muscle weakness (generalized): Secondary | ICD-10-CM | POA: Diagnosis not present

## 2021-05-25 DIAGNOSIS — R278 Other lack of coordination: Secondary | ICD-10-CM | POA: Diagnosis not present

## 2021-05-25 DIAGNOSIS — R262 Difficulty in walking, not elsewhere classified: Secondary | ICD-10-CM | POA: Diagnosis not present

## 2021-05-25 NOTE — Therapy (Signed)
[place] ?[facility] ?[place], [place], [address] ?Phone: [phone]   Fax:  [phone] ? ?Occupational Therapy Treatment ? ?Patient Details  ?Name: Susan Shah ?MRN: [id number] ?Date of Birth: [birth year] ?Referring Provider (OT): Dr. Manuella Ghazi ? ? ?Encounter Date: 05/24/2021 ? ? OT End of Session - 05/25/21 1520   ? ? Visit Number 15   ? Number of Visits 17   ? Date for OT Re-Evaluation 05/25/21   ? Authorization Time Period Reporting period beginning 04/18/21   ? OT Start Time 1200   ? OT Stop Time 1300   ? OT Time Calculation (min) 60 min   ? Equipment Utilized During Treatment crutches, transport chair   ? Activity Tolerance Patient tolerated treatment well   ? Behavior During Therapy [facility] for tasks assessed/performed   ? ?  ?  ? ?  ? ? ?Past Medical History:  ?Diagnosis Date  ? Anxiety   ? Asthma 2015  ? mild, seasonal allergy triggered.  ? Cancer [facility]) 2013  ? skin cancer  on left hand  ? Depression   ? Esophageal dysmotility   ? GERD (gastroesophageal reflux disease) 11/05/2012  ? History of hiatal hernia   ? Hyperlipidemia   ? Hypertension   ? Incontinence of urine   ? Lung nodule   ? OA (osteoarthritis)   ? Obesity   ? Parkinson's disease ([place])   ? PONV (postoperative nausea and vomiting)   ? Post-polio syndrome   ? contracted at 52 months old  ? Pulmonary nodule 11/05/2012  ? RML  ? Shingles 2009  ? Sleep apnea   ? ? ?Past Surgical History:  ?Procedure Laterality Date  ? 20 HOUR [place] STUDY N/A 04/24/2016  ? Procedure: [place] STUDY;  Surgeon: Lucilla Lame, MD;  Location: [facility] ENDOSCOPY;  Service: Endoscopy;  Laterality: N/A;  ? ABDOMINAL HYSTERECTOMY    ? Total  ? APPENDECTOMY    ? CARPOMETACARPAL (CMC) FUSION OF THUMB Left 05/16/2016  ? Procedure: CARPOMETACARPAL [facility]) FUSION OF THUMB;  Surgeon: Hessie Knows, MD;  Location: [facility] ORS;  Service: Orthopedics;  Laterality: Left;  ? CARPOMETACARPAL (CMC) FUSION OF THUMB Left 03/30/2019  ? Procedure: LEFT THUMB  SUSPENSION PLASTY;  Surgeon: Hessie Knows, MD;  Location: [facility] ORS;  Service: Orthopedics;  Laterality: Left;  ? CATARACT EXTRACTION Bilateral 12/2012  ? CHOLECYSTECTOMY    ? COLONOSCOPY  2003  ? ESOPHAGEAL MANOMETRY N/A 04/24/2016  ? Procedure: ESOPHAGEAL MANOMETRY (EM);  Surgeon: Lucilla Lame, MD;  Location: [facility] ENDOSCOPY;  Service: Endoscopy;  Laterality: N/A;  ? ESOPHAGOGASTRODUODENOSCOPY  08/2011  ? FOOT SURGERY    ? HALLUX VALGUS CORRECTION    ? HIP SURGERY Right 1950  ? tendon release r/t polio  ? KNEE ARTHROSCOPY Left 06/23/2008  ? RETINAL DETACHMENT SURGERY Right 03/2014  ? ROTATOR CUFF REPAIR Bilateral   ? URINARY SURGERY  2014  ? [place]  ? ? ?There were no vitals filed for this visit. ? ? Subjective Assessment - 05/24/21 1519   ? ? Subjective  Pt reports feeling somewhat better today.   ? Pertinent History CMC arthroplasty revision 3/21, polio with RLE weakness and leg length discrepency (pt reports RLE ~4" shorter), PD   ? Limitations weakness, lack of coordination, decreased balance, hx of falling, BUE dyskinesia, trunk and cervical dyskinesia   ? Patient Stated Goals "To get stronger."   ? Currently in Pain? No/denies   ? Pain Score 0-No pain   ?  Pain Onset 1 to 4 weeks ago   ? Pain Onset 1 to 4 weeks ago   ? ?  ?  ? ?  ? ?Occupational Therapy Treatment/Progress Note: ?Neuro re-ed: ?Patient seen for instruction of LSVT BIG exercises: LSVT Daily Session Standing Maximal Daily Exercises: Sustained movements are designed to rescale the amplitude of movement output for generalization to daily functional activities. Performed as follows for 1 set of 15 repetitions each: Multi directional sustained movements- 1) Floor to ceiling (seated), 2) Side to side (seated). Multi directional Repetitive movements performed in standing (adapted with 1 hand on chair for support in standing) and are designed to provide retraining effort needed for sustained muscle activation in tasks performed as follows: 3) Step and  reach forward, 4) Step and Reach Backwards, 5) Step and reach sideways, 6) Rock and reach forward/backward, 7) Rock and reach sideways.  Performed sit to stand from mat table on lowest setting with cues for weight shift, technique, and supv for 5 reps.  Pt required 2 tactile cues for sustaining big arm movements throughout and did well to sustain erect sitting without cues.  Occasional cueing to improve standing posture with less reliance on chair for support, as pt occasionally leaned hips against chair with the rock and reach sideways exercise.  Pt requires modifications due to history of polio, RLE brace and balance deficits.  Pt able to initiate all maximal daily exercises without therapist first modeling and following written handout.  ?  ?Self Care: ?Functional Component tasks:   ?1) sit to stand x5 reps; min vc for technique for forward weight shifting; some difficulty with weight shift onto right LE (polio affected leg and built up shoe).  Chair placed in front for unilateral support upon standing. ?2) manipulation of buttons: x5 buttons on a button up shirt in sitting; pt initiated 10 finger flicks prior to buttons to improve performance and facilitating "big" hand movements.  Pt performing with less effort, but still a little extra time. ?3) stepping down onto porch: practiced stepping up and down wooden blocks with bilat crutches x5 reps with min guard d/t no handrail. ?4) reaching in overhead cabinets in kitchen: simulated with cup sized item reaching from counter level to 2nd shelf x3 reps each hand, unilateral support with 1 hand on countertop.  ?5) Donning/clasping watch: Pt practiced with watch and 2 bracelets, requiring min-mod A and multiple attempts/extra time for clasping.  Pt was indep to utilize finger flicks. ? ?Hierarchy Tasks: Checking mail ?Practiced big walking with reinforcement to utilize big steps to clear feet, especially when fatigued  (especially R foot/ monoplegic side) which  occasionally scuffs the floor when tired.  OT continues to encourage frequent walking at home to continue to build activity tolerance for community mobility and checking mail.  ?  ?Response to tx: ?Pt reports feeling better today with her cold symptoms.  Pt reports increased confidence with taking dishes in and out of the oven.  Pt states that prior to therapy, pt would be very hesitant to reach into the oven, but now completes this activity without concern.  Pt was able to tolerate 15 reps of maximal daily exercises today with intermittent sitting rest breaks.   Pt continues to tolerate all standing exercises well in the adapted position (with 1 hand on chair for support).  Pt is sustaining big arm and hand movements well, taking bigger steps with maximal daily exercises, and maintaining sitting and standing posture with less UE support and fewer cues.  Planning discharge assessment next session.  ? ? ? ? ? OT Education - 05/24/21 1520   ? ? Education Details standing maximal daily exercises   ? Person(s) Educated Patient   ? Methods Explanation;Verbal cues;Demonstration;Tactile cues   ? Comprehension Verbalized understanding;Verbal cues required;Need further instruction;Returned demonstration;Tactile cues required   ? ?  ?  ? ?  ? ? ? OT Short Term Goals - 05/15/21 1130   ? ?  ? OT SHORT TERM GOAL #1  ? Title Pt will be independent to perform maximial daily exercises.   ? Baseline Eval: Will initiate next session; 05/15/21: performs with mod vc, min tactile cues   ? Time 2   ? Period Weeks   ? Status On-going   ? Target Date 05/03/21   ?  ? OT SHORT TERM GOAL #2  ? Title Pt will manage buttons on shirt and pants with modified indep and extra time.   ? Baseline Eval: Difficult, often requires caregiver assist; 05/15/21: modified indep with shirt buttons, min A with pant buttons   ? Time 3   ? Period Weeks   ? Status On-going   ? Target Date 05/10/21   ? ?  ?  ? ?  ? ? ? ? OT Long Term Goals - 05/15/21 1132   ? ?  ?  OT LONG TERM GOAL #1  ? Title Pt will perform shower transfer with modified indep.   ? Baseline Eval: close supv; 05/15/21: close supv   ? Time 4   ? Period Weeks   ? Status On-going   ? Target Date 05/25/21

## 2021-05-26 NOTE — Therapy (Signed)
[place] ?[facility] ?[place], [place], [address] ?Phone: [phone]   Fax:  [phone] ? ?Occupational Therapy Discharge ? ?Patient Details  ?Name: Susan Shah ?MRN: [id number] ?Date of Birth: January 16, 1946 ?Referring Provider (OT): Dr. Manuella Ghazi ? ? ?Encounter Date: 05/25/2021 ? ? OT End of Session - 05/26/21 0725   ? ? Visit Number 16   ? Number of Visits 17   ? Date for OT Re-Evaluation 05/25/21   ? Authorization Time Period Reporting period beginning 05/15/21   ? OT Start Time [id number]   ? OT Stop Time 1015   ? OT Time Calculation (min) 51 min   ? Equipment Utilized During Treatment crutches, transport chair   ? Activity Tolerance Patient tolerated treatment well   ? Behavior During Therapy [facility] for tasks assessed/performed   ? ?  ?  ? ?  ? ? ?Past Medical History:  ?Diagnosis Date  ? Anxiety   ? Asthma 2015  ? mild, seasonal allergy triggered.  ? Cancer [facility]) 2013  ? skin cancer  on left hand  ? Depression   ? Esophageal dysmotility   ? GERD (gastroesophageal reflux disease) 11/05/2012  ? History of hiatal hernia   ? Hyperlipidemia   ? Hypertension   ? Incontinence of urine   ? Lung nodule   ? OA (osteoarthritis)   ? Obesity   ? Parkinson's disease ([place])   ? PONV (postoperative nausea and vomiting)   ? Post-polio syndrome   ? contracted at 17 months old  ? Pulmonary nodule 11/05/2012  ? RML  ? Shingles 2009  ? Sleep apnea   ? ? ?Past Surgical History:  ?Procedure Laterality Date  ? 61 HOUR [place] STUDY N/A 04/24/2016  ? Procedure: [place] STUDY;  Surgeon: Lucilla Lame, MD;  Location: [facility] ENDOSCOPY;  Service: Endoscopy;  Laterality: N/A;  ? ABDOMINAL HYSTERECTOMY    ? Total  ? APPENDECTOMY    ? CARPOMETACARPAL (CMC) FUSION OF THUMB Left 05/16/2016  ? Procedure: CARPOMETACARPAL [facility]) FUSION OF THUMB;  Surgeon: Hessie Knows, MD;  Location: [facility] ORS;  Service: Orthopedics;  Laterality: Left;  ? CARPOMETACARPAL (CMC) FUSION OF THUMB Left 03/30/2019  ? Procedure: LEFT THUMB  SUSPENSION PLASTY;  Surgeon: Hessie Knows, MD;  Location: [facility] ORS;  Service: Orthopedics;  Laterality: Left;  ? CATARACT EXTRACTION Bilateral 12/2012  ? CHOLECYSTECTOMY    ? COLONOSCOPY  2003  ? ESOPHAGEAL MANOMETRY N/A 04/24/2016  ? Procedure: ESOPHAGEAL MANOMETRY (EM);  Surgeon: Lucilla Lame, MD;  Location: [facility] ENDOSCOPY;  Service: Endoscopy;  Laterality: N/A;  ? ESOPHAGOGASTRODUODENOSCOPY  08/2011  ? FOOT SURGERY    ? HALLUX VALGUS CORRECTION    ? HIP SURGERY Right 1950  ? tendon release r/t polio  ? KNEE ARTHROSCOPY Left 06/23/2008  ? RETINAL DETACHMENT SURGERY Right 03/2014  ? ROTATOR CUFF REPAIR Bilateral   ? URINARY SURGERY  2014  ? [place]  ? ? ?There were no vitals filed for this visit. ? ? Subjective Assessment - 05/25/21 0723   ? ? Subjective  Pt reports it was difficult to get up early this morning but she made it.   ? Pertinent History CMC arthroplasty revision 3/21, polio with RLE weakness and leg length discrepency (pt reports RLE ~4" shorter), PD   ? Limitations weakness, lack of coordination, decreased balance, hx of falling, BUE dyskinesia, trunk and cervical dyskinesia   ? Patient Stated Goals "To get stronger."   ? Currently in Pain? No/denies   ?  Pain Score 0-No pain   ? Pain Onset 1 to 4 weeks ago   ? Pain Onset 1 to 4 weeks ago   ? ?  ?  ? ?  ? ? ? ? ? [place] OT Assessment - 05/26/21 0001   ? ?  ? Assessment  ? Medical Diagnosis Parkinson's   ? Referring Provider (OT) Dr. Manuella Ghazi   ? Hand Dominance Right   ?  ? Observation/Other Assessments  ? Focus on Therapeutic Outcomes (FOTO)  53 (40 at eval)   ? Outcome Measures BERG: 38/56 (27 at eval), TUG: 24 sec (30 sec at eval)   ?  ? Coordination  ? Right 9 Hole Peg Test 32 sec (no significant change)   ? Left 9 Hole Peg Test 40 sec (no significant change)   ?  ? Hand Function  ? Right Hand Grip (lbs) 46 (6 lb improvement)   ? Left Hand Grip (lbs) 44 (no significant change, though this side with hx of thumb sx)   ? ?  ?  ? ?  ? ?Occupational Therapy  Treatment/Discharge: ?Neuro re-ed: ?Objective measures taken as follows: ?BERG: 38/56 (improved from 27/56 at eval) ?TUG: 24 secs with bilat crutches (improved from 30 sec at eval) ?6 min walk: 692 ft with no standing rest breaks (improved from 610 ft with 1 standing rest break on 05/17/21) ? ?Self Care: ?ADL modifications and fall prevention reviewed.  Pt verbalizes understanding of all recommendations.   ? ?Response to Treatment: ?Discharge assessment completed this day.  FOTO score of 53 at d/c, improved from 40 at eval.  BERG score has improved by 11 points since eval, TUG score has improved by 6 sec, 6 min walk test improved from 610 ft on 05/17/21 with 1 standing rest break, to 692 ft with no standing rest break at discharge.  No significant change with 9 hole peg test, but performing finger flicks before FM tasks for ADLs has been effective for pt to more easily manage buttons on shirt. R grip (dominant) has improved by 6 lbs; pt has been working with her theraputty and has been encouraged to continue for additional strengthening in bilat hands.  Pt is now participating in meal prep with her husband and reports increased confidence with item transport and reaching activities during ADLs.  Pt now has sufficient endurance and stability to start walking to her mailbox with spouse's supv (supv recommended on uneven surfaces).  Pt is able to perform her maximal daily exercises from a chair or adapted (in standing with unilateral support from a chair) following a written handout.  Pt has been encouraged to continue with these exercises to maintain big posture, UE range, big steps, and maintain balance for daily tasks and mobility.  Pt endorses feeling stronger and more confident with daily tasks.  OT is recommending PT evaluation to further increase BLE strength and continue to progress balance for daily tasks.  Pt is in agreement with this plan and will schedule when order is signed by MD.  Pt appropriate for OT  discharge this date with pt in agreement with plan.  ? ? ? OT Education - 05/25/21 0725   ? ? Education Details goal review, discharge recommendations   ? Person(s) Educated Patient   ? Methods Explanation   ? Comprehension Verbalized understanding   ? ?  ?  ? ?  ? ? ? OT Short Term Goals - 05/25/21 0727   ? ?  ? OT SHORT TERM GOAL #  1  ? Title Pt will be independent to perform maximial daily exercises.   ? Baseline Eval: Will initiate next session; 05/15/21: performs with mod vc, min tactile cues; 05/25/21 d/c: Pt can initiate all exercises independently following written handout but does require cues for optimal technique.   ? Time 2   ? Period Weeks   ? Status Partially Met   ? Target Date 05/03/21   ?  ? OT SHORT TERM GOAL #2  ? Title Pt will manage buttons on shirt and pants with modified indep and extra time.   ? Baseline Eval: Difficult, often requires caregiver assist; 05/15/21: modified indep with shirt buttons, min A with pant buttons; 05/25/21 d/c: modified indep (finger flicks effective for efficiency with buttons)   ? Time 3   ? Period Weeks   ? Status Achieved   ? Target Date 05/10/21   ? ?  ?  ? ?  ? ? ? ? OT Long Term Goals - 05/25/21 0730   ? ?  ? OT LONG TERM GOAL #1  ? Title Pt will perform shower transfer with modified indep.   ? Baseline Eval: close supv; 05/15/21: close supv; 05/25/21 d/c: close supv from spouse and pt plans to have this supv ongoing d/t the nature of her bathroom set up.  Pt does have all recommended safety accommodations, including grab bars, shower seat, and non-skid mat, and spouse is easily able to provide this supv for pt to increase confidence.   ? Time 4   ? Period Weeks   ? Status Deferred   ? Target Date 05/25/21   ?  ? OT LONG TERM GOAL #2  ? Title Pt will increase FMC to enable modified indep with fastening necklaces and bracelets.   ? Baseline Eval: spouse assists; 05/15/21: improving, extra time and inconsistent, but improved with finger flicks.  Pt receptive to magnetic  closure modification; 05/25/21: Pt is inconsistent to fasten jewelry, but states that finger flicks are effective.  Pt has been educated on modifications, including taking selected jewelry in to have magnetic

## 2021-05-29 DIAGNOSIS — M26609 Unspecified temporomandibular joint disorder, unspecified side: Secondary | ICD-10-CM | POA: Diagnosis not present

## 2021-05-29 DIAGNOSIS — R300 Dysuria: Secondary | ICD-10-CM | POA: Diagnosis not present

## 2021-05-29 DIAGNOSIS — F411 Generalized anxiety disorder: Secondary | ICD-10-CM | POA: Diagnosis not present

## 2021-05-29 DIAGNOSIS — Z9989 Dependence on other enabling machines and devices: Secondary | ICD-10-CM | POA: Diagnosis not present

## 2021-05-29 DIAGNOSIS — R112 Nausea with vomiting, unspecified: Secondary | ICD-10-CM | POA: Diagnosis not present

## 2021-05-29 DIAGNOSIS — G4733 Obstructive sleep apnea (adult) (pediatric): Secondary | ICD-10-CM | POA: Diagnosis not present

## 2021-05-29 DIAGNOSIS — R1114 Bilious vomiting: Secondary | ICD-10-CM | POA: Diagnosis not present

## 2021-05-29 DIAGNOSIS — E559 Vitamin D deficiency, unspecified: Secondary | ICD-10-CM | POA: Diagnosis not present

## 2021-05-29 DIAGNOSIS — E039 Hypothyroidism, unspecified: Secondary | ICD-10-CM | POA: Diagnosis not present

## 2021-05-29 DIAGNOSIS — E782 Mixed hyperlipidemia: Secondary | ICD-10-CM | POA: Diagnosis not present

## 2021-05-29 DIAGNOSIS — K219 Gastro-esophageal reflux disease without esophagitis: Secondary | ICD-10-CM | POA: Diagnosis not present

## 2021-05-29 DIAGNOSIS — G20 Parkinson's disease: Secondary | ICD-10-CM | POA: Diagnosis not present

## 2021-05-30 LAB — CMP14+EGFR
ALT: 10 IU/L (ref 0–32)
AST: 15 IU/L (ref 0–40)
Albumin/Globulin Ratio: 1.8 (ref 1.2–2.2)
Albumin: 4.4 g/dL (ref 3.7–4.7)
Alkaline Phosphatase: 100 IU/L (ref 44–121)
BUN/Creatinine Ratio: 19 (ref 12–28)
BUN: 15 mg/dL (ref 8–27)
Bilirubin Total: 0.3 mg/dL (ref 0.0–1.2)
CO2: 25 mmol/L (ref 20–29)
Calcium: 9.3 mg/dL (ref 8.7–10.3)
Chloride: 102 mmol/L (ref 96–106)
Creatinine, Ser: 0.78 mg/dL (ref 0.57–1.00)
Globulin, Total: 2.5 g/dL (ref 1.5–4.5)
Glucose: 92 mg/dL (ref 70–99)
Potassium: 4.1 mmol/L (ref 3.5–5.2)
Sodium: 141 mmol/L (ref 134–144)
Total Protein: 6.9 g/dL (ref 6.0–8.5)
eGFR: 80 mL/min/{1.73_m2} (ref >59)

## 2021-05-30 LAB — CBC WITH DIFFERENTIAL/PLATELET
Basophils Absolute: 0 10*3/uL (ref 0.0–0.2)
Basos: 1 %
EOS (ABSOLUTE): 0.3 10*3/uL (ref 0.0–0.4)
Eos: 5 %
Hematocrit: 36 % (ref 34.0–46.6)
Hemoglobin: 11.7 g/dL (ref 11.1–15.9)
Immature Grans (Abs): 0 10*3/uL (ref 0.0–0.1)
Immature Granulocytes: 0 %
Lymphocytes Absolute: 1.7 10*3/uL (ref 0.7–3.1)
Lymphs: 31 %
MCH: 28.3 pg (ref 26.6–33.0)
MCHC: 32.5 g/dL (ref 31.5–35.7)
MCV: 87 fL (ref 79–97)
Monocytes Absolute: 0.4 10*3/uL (ref 0.1–0.9)
Monocytes: 7 %
Neutrophils Absolute: 3.2 10*3/uL (ref 1.4–7.0)
Neutrophils: 56 %
Platelets: 340 10*3/uL (ref 150–450)
RBC: 4.14 x10E6/uL (ref 3.77–5.28)
RDW: 12.7 % (ref 11.7–15.4)
WBC: 5.6 10*3/uL (ref 3.4–10.8)

## 2021-05-30 LAB — LIPID PANEL
Chol/HDL Ratio: 2.1 ratio (ref 0.0–4.4)
Cholesterol, Total: 146 mg/dL (ref 100–199)
HDL: 69 mg/dL (ref >39)
LDL Chol Calc (NIH): 58 mg/dL (ref 0–99)
Triglycerides: 108 mg/dL (ref 0–149)
VLDL Cholesterol Cal: 19 mg/dL (ref 5–40)

## 2021-05-30 LAB — VITAMIN D 25 HYDROXY (VIT D DEFICIENCY, FRACTURES): Vit D, 25-Hydroxy: 24.4 ng/mL — ABNORMAL LOW (ref 30.0–100.0)

## 2021-05-30 LAB — TSH+FREE T4
Free T4: 1.43 ng/dL (ref 0.82–1.77)
TSH: 1.68 u[IU]/mL (ref 0.450–4.500)

## 2021-05-31 ENCOUNTER — Ambulatory Visit: Payer: Medicare Other

## 2021-05-31 DIAGNOSIS — G20 Parkinson's disease: Secondary | ICD-10-CM

## 2021-05-31 DIAGNOSIS — M6281 Muscle weakness (generalized): Secondary | ICD-10-CM

## 2021-05-31 DIAGNOSIS — G20A1 Parkinson's disease without dyskinesia, without mention of fluctuations: Secondary | ICD-10-CM

## 2021-05-31 DIAGNOSIS — R278 Other lack of coordination: Secondary | ICD-10-CM | POA: Diagnosis not present

## 2021-05-31 DIAGNOSIS — R2681 Unsteadiness on feet: Secondary | ICD-10-CM

## 2021-05-31 DIAGNOSIS — R262 Difficulty in walking, not elsewhere classified: Secondary | ICD-10-CM | POA: Diagnosis not present
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
## 2021-06-04 ENCOUNTER — Ambulatory Visit: Payer: Medicare Other

## 2021-06-06 ENCOUNTER — Ambulatory Visit: Payer: Medicare Other

## 2021-06-06 DIAGNOSIS — R278 Other lack of coordination: Secondary | ICD-10-CM

## 2021-06-06 DIAGNOSIS — R262 Difficulty in walking, not elsewhere classified: Secondary | ICD-10-CM | POA: Diagnosis not present

## 2021-06-06 DIAGNOSIS — M6281 Muscle weakness (generalized): Secondary | ICD-10-CM

## 2021-06-06 DIAGNOSIS — R2681 Unsteadiness on feet: Secondary | ICD-10-CM | POA: Diagnosis not present

## 2021-06-06 DIAGNOSIS — G20 Parkinson's disease: Secondary | ICD-10-CM | POA: Diagnosis not present
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
## 2021-06-08 ENCOUNTER — Ambulatory Visit: Payer: Medicare Other

## 2021-06-12 ENCOUNTER — Ambulatory Visit: Payer: Medicare Other

## 2021-06-12 DIAGNOSIS — R278 Other lack of coordination: Secondary | ICD-10-CM | POA: Diagnosis not present

## 2021-06-12 DIAGNOSIS — R262 Difficulty in walking, not elsewhere classified: Secondary | ICD-10-CM | POA: Diagnosis not present

## 2021-06-12 DIAGNOSIS — R2681 Unsteadiness on feet: Secondary | ICD-10-CM | POA: Diagnosis not present

## 2021-06-12 DIAGNOSIS — G20 Parkinson's disease: Secondary | ICD-10-CM | POA: Diagnosis not present

## 2021-06-12 DIAGNOSIS — M6281 Muscle weakness (generalized): Secondary | ICD-10-CM
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
## 2021-06-14 ENCOUNTER — Ambulatory Visit: Payer: Medicare Other

## 2021-06-20 ENCOUNTER — Ambulatory Visit: Payer: Medicare Other

## 2021-06-25 ENCOUNTER — Ambulatory Visit: Payer: Medicare Other

## 2021-06-26 ENCOUNTER — Ambulatory Visit: Payer: Medicare Other | Attending: Neurology

## 2021-06-26 ENCOUNTER — Ambulatory Visit (INDEPENDENT_AMBULATORY_CARE_PROVIDER_SITE_OTHER): Payer: Medicare Other | Admitting: Nurse Practitioner

## 2021-06-26 ENCOUNTER — Encounter: Payer: Self-pay | Admitting: Nurse Practitioner

## 2021-06-26 VITALS — BP 140/75 | HR 77 | Temp 98.3°F | Resp 16 | Ht 63.0 in | Wt 147.0 lb

## 2021-06-26 DIAGNOSIS — R2681 Unsteadiness on feet: Secondary | ICD-10-CM | POA: Diagnosis not present

## 2021-06-26 DIAGNOSIS — K219 Gastro-esophageal reflux disease without esophagitis: Secondary | ICD-10-CM | POA: Diagnosis not present

## 2021-06-26 DIAGNOSIS — R262 Difficulty in walking, not elsewhere classified: Secondary | ICD-10-CM | POA: Insufficient documentation

## 2021-06-26 DIAGNOSIS — M6281 Muscle weakness (generalized): Secondary | ICD-10-CM | POA: Diagnosis not present

## 2021-06-26 DIAGNOSIS — F411 Generalized anxiety disorder: Secondary | ICD-10-CM | POA: Diagnosis not present

## 2021-06-26 DIAGNOSIS — K449 Diaphragmatic hernia without obstruction or gangrene: Secondary | ICD-10-CM

## 2021-06-26 DIAGNOSIS — N3946 Mixed incontinence: Secondary | ICD-10-CM

## 2021-06-26 DIAGNOSIS — I10 Essential (primary) hypertension: Secondary | ICD-10-CM

## 2021-06-26 DIAGNOSIS — G20 Parkinson's disease: Secondary | ICD-10-CM

## 2021-06-26 DIAGNOSIS — R278 Other lack of coordination: Secondary | ICD-10-CM | POA: Diagnosis not present

## 2021-06-26 DIAGNOSIS — E782 Mixed hyperlipidemia: Secondary | ICD-10-CM

## 2021-06-26 DIAGNOSIS — E876 Hypokalemia: Secondary | ICD-10-CM | POA: Diagnosis not present

## 2021-06-26 DIAGNOSIS — M26609 Unspecified temporomandibular joint disorder, unspecified side: Secondary | ICD-10-CM

## 2021-06-26 MED ORDER — LOSARTAN POTASSIUM 100 MG PO TABS: 100.0000 mg | ORAL_TABLET | Freq: Every day | ORAL | 1 refills | Status: DC

## 2021-06-26 MED ORDER — HYDROCODONE-ACETAMINOPHEN 5-325 MG PO TABS: 1.0000 | ORAL_TABLET | ORAL | 0 refills | Status: DC | PRN

## 2021-06-26 MED ORDER — SIMVASTATIN 20 MG PO TABS: ORAL_TABLET | ORAL | 1 refills | Status: DC

## 2021-06-26 MED ORDER — OXYBUTYNIN CHLORIDE ER 10 MG PO TB24: 20.0000 mg | ORAL_TABLET | Freq: Every day | ORAL | 1 refills | Status: DC

## 2021-06-26 MED ORDER — BUPROPION HCL ER (XL) 150 MG PO TB24: 150.0000 mg | ORAL_TABLET | Freq: Every day | ORAL | 1 refills | Status: DC

## 2021-06-26 MED ORDER — PANTOPRAZOLE SODIUM 40 MG PO TBEC: 40.0000 mg | DELAYED_RELEASE_TABLET | Freq: Two times a day (BID) | ORAL | 1 refills | Status: DC

## 2021-06-26 MED ORDER — ESCITALOPRAM OXALATE 20 MG PO TABS: 20.0000 mg | ORAL_TABLET | Freq: Every day | ORAL | 1 refills | Status: DC

## 2021-06-26 MED ORDER — POTASSIUM CHLORIDE CRYS ER 20 MEQ PO TBCR: 20.0000 meq | EXTENDED_RELEASE_TABLET | Freq: Every day | ORAL | 1 refills | Status: DC

## 2021-06-26 MED ORDER — AMLODIPINE BESYLATE 2.5 MG PO TABS: ORAL_TABLET | ORAL | 2 refills | Status: DC

## 2021-06-26 MED ORDER — METOPROLOL TARTRATE 25 MG PO TABS: 25.0000 mg | ORAL_TABLET | Freq: Two times a day (BID) | ORAL | 1 refills | Status: DC
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
## 2021-06-26 NOTE — Progress Notes (Signed)
[facility] [place], [place] [address]  Internal MEDICINE  Office Visit Note  Patient Name: Susan Shah  [id number]  [id number]  Date of Service: 06/26/2021  Chief Complaint  Patient presents with   Follow-up   Depression   Gastroesophageal Reflux   Hypertension   Hyperlipidemia   Anxiety   Asthma   Medication Refill   Results    HPI Susan Shah presents for follow-up visit for hypertension, asthma, hyperlipidemia, GERD, anxiety and depression, medication refills and to discuss lab results. She does need several medication refills. Her blood pressure is stable on current medications.  She reports her anxiety level is low and denies any depressive symptoms at this time.  She reports that her breathing is stable and denies any shortness of breath, wheezing, chest tightness or cough. She is requesting a letter for exemption from jury duty due to age and parkinson disease.  She had her labs drawn in May this year,  --her vitamin D level is slightly low at 24.4. --Her CBC is normal --Her cholesterol levels are normal with a LDL of 58 and an HDL of 69. --Her metabolic panel is normal and her thyroid levels are normal   Current Medication: Outpatient Encounter Medications as of 06/26/2021  Medication Sig   acetaminophen (TYLENOL) 500 MG tablet Take 1,000 mg by mouth every 6 (six) hours as needed for moderate pain or headache.   carbidopa-levodopa (SINEMET) 25-100 MG tablet Take one tab po qpm   Carboxymethylcellul-Glycerin (LUBRICATING EYE DROPS OP) Place 1 drop into both eyes daily as needed (dry eyes).   cholecalciferol (VITAMIN D3) 25 MCG (1000 UNIT) tablet Take 1,000 Units by mouth daily.   famotidine (PEPCID) 20 MG tablet TAKE 1 TABLET (20 MG TOTAL) BY MOUTH 2 (TWO) TIMES DAILY AS NEEDED FOR HEARTBURN OR INDIGESTION.   fluticasone (FLONASE) 50 MCG/ACT nasal spray Place 1 spray into both nostrils at bedtime as needed for allergies or rhinitis.   loratadine  (QC LORATADINE ALLERGY RELIEF) 10 MG tablet Take 1 tablet (10 mg total) by mouth daily.   ondansetron (ZOFRAN-ODT) 4 MG disintegrating tablet Take 1 tablet (4 mg total) by mouth every 8 (eight) hours as needed for nausea or vomiting.   rOPINIRole (REQUIP) 0.25 MG tablet Take 1 tablet (0.25 mg total) by mouth 3 (three) times daily.   vitamin B-12 (CYANOCOBALAMIN) 1000 MCG tablet Take 1,000 mcg by mouth daily.   [DISCONTINUED] amLODipine (NORVASC) 2.5 MG tablet TAKE 1 TABLET BY MOUTH EVERY DAY - STOP LISINOPRIL   [DISCONTINUED] buPROPion (WELLBUTRIN XL) 150 MG 24 hr tablet Take 1 tablet (150 mg total) by mouth daily.   [DISCONTINUED] escitalopram (LEXAPRO) 20 MG tablet Take 1 tablet (20 mg total) by mouth daily.   [DISCONTINUED] HYDROcodone-acetaminophen (NORCO) 5-325 MG tablet Take 1 tablet by mouth every 4 (four) hours as needed for moderate pain or severe pain. No more than 6 tablets per 24 hours   [DISCONTINUED] losartan (COZAAR) 100 MG tablet TAKE 1 TABLET BY MOUTH EVERY DAY   [DISCONTINUED] metoprolol tartrate (LOPRESSOR) 25 MG tablet TAKE 1 TABLET (25 MG TOTAL) BY MOUTH 2 (TWO) TIMES DAILY WITH A MEAL.   [DISCONTINUED] oxybutynin (DITROPAN-XL) 10 MG 24 hr tablet Take 2 tablets (20 mg total) by mouth daily.   [DISCONTINUED] pantoprazole (PROTONIX) 40 MG tablet Take 1 tablet (40 mg total) by mouth 2 (two) times daily.   [DISCONTINUED] potassium chloride SA (KLOR-CON M) 20 MEQ tablet Take 1 tablet (20 mEq total) by mouth  daily.   [DISCONTINUED] simvastatin (ZOCOR) 20 MG tablet TAKE 1 TABLET BY MOUTH DAILY AT 6 PM.   amLODipine (NORVASC) 2.5 MG tablet TAKE 1 TABLET BY MOUTH EVERY DAY - STOP LISINOPRIL   buPROPion (WELLBUTRIN XL) 150 MG 24 hr tablet Take 1 tablet (150 mg total) by mouth daily.   escitalopram (LEXAPRO) 20 MG tablet Take 1 tablet (20 mg total) by mouth daily.   HYDROcodone-acetaminophen (NORCO) 5-325 MG tablet Take 1 tablet by mouth every 4 (four) hours as needed for moderate pain or  severe pain. No more than 6 tablets per 24 hours   [START ON 07/24/2021] HYDROcodone-acetaminophen (NORCO) 5-325 MG tablet Take 1 tablet by mouth every 4 (four) hours as needed for moderate pain or severe pain. No more than 6 tablets per 24 hours   losartan (COZAAR) 100 MG tablet Take 1 tablet (100 mg total) by mouth daily.   metoprolol tartrate (LOPRESSOR) 25 MG tablet Take 1 tablet (25 mg total) by mouth 2 (two) times daily with a meal.   oxybutynin (DITROPAN-XL) 10 MG 24 hr tablet Take 2 tablets (20 mg total) by mouth daily.   pantoprazole (PROTONIX) 40 MG tablet Take 1 tablet (40 mg total) by mouth 2 (two) times daily.   potassium chloride SA (KLOR-CON M) 20 MEQ tablet Take 1 tablet (20 mEq total) by mouth daily.   simvastatin (ZOCOR) 20 MG tablet TAKE 1 TABLET BY MOUTH DAILY AT 6 PM.   No facility-administered encounter medications on file as of 06/26/2021.    Surgical History: Past Surgical History:  Procedure Laterality Date   52 HOUR [place] STUDY N/A 04/24/2016   Procedure: 24 HOUR PH STUDY;  Surgeon: Lucilla Lame, MD;  Location: [facility] ENDOSCOPY;  Service: Endoscopy;  Laterality: N/A;   ABDOMINAL HYSTERECTOMY     Total   APPENDECTOMY     CARPOMETACARPAL ([facility]) FUSION OF THUMB Left 05/16/2016   Procedure: CARPOMETACARPAL [facility]) FUSION OF THUMB;  Surgeon: Hessie Knows, MD;  Location: [facility] ORS;  Service: Orthopedics;  Laterality: Left;   CARPOMETACARPAL ([place]) FUSION OF THUMB Left 03/30/2019   Procedure: LEFT THUMB SUSPENSION PLASTY;  Surgeon: Hessie Knows, MD;  Location: [facility] ORS;  Service: Orthopedics;  Laterality: Left;   CATARACT EXTRACTION Bilateral 12/2012   CHOLECYSTECTOMY     COLONOSCOPY  2003   ESOPHAGEAL MANOMETRY N/A 04/24/2016   Procedure: ESOPHAGEAL MANOMETRY (EM);  Surgeon: Lucilla Lame, MD;  Location: [facility] ENDOSCOPY;  Service: Endoscopy;  Laterality: N/A;   ESOPHAGOGASTRODUODENOSCOPY  08/2011   FOOT SURGERY     HALLUX VALGUS CORRECTION     HIP SURGERY Right 1950   tendon release r/t  polio   KNEE ARTHROSCOPY Left 06/23/2008   RETINAL DETACHMENT SURGERY Right 03/2014   ROTATOR CUFF REPAIR Bilateral    URINARY SURGERY  2014   [place]    Medical History: Past Medical History:  Diagnosis Date   Anxiety    Asthma 2015   mild, seasonal allergy triggered.   Cancer [facility]) 2013   skin cancer  on left hand   Depression    Esophageal dysmotility    GERD (gastroesophageal reflux disease) 11/05/2012   History of hiatal hernia    Hyperlipidemia    Hypertension    Incontinence of urine    Lung nodule    OA (osteoarthritis)    Obesity    Parkinson's disease (HCC)    PONV (postoperative nausea and vomiting)    Post-polio syndrome    contracted at 60 months old   Pulmonary  nodule 11/05/2012   RML   Shingles 2009   Sleep apnea     Family History: Family History  Problem Relation Age of Onset   Heart disease Mother    Heart disease Father    Heart attack Sister    Heart disease Brother    Heart attack Brother    Breast cancer Maternal Aunt    Stroke Maternal Grandmother    Heart disease Maternal Grandfather     Social History   Socioeconomic History   Marital status: Married    Spouse name: Not on file   Number of children: Not on file   Years of education: Not on file   Highest education level: Not on file  Occupational History   Not on file  Tobacco Use   Smoking status: Former    Types: Cigarettes    Quit date: 05/09/1968    Years since quitting: 53.1   Smokeless tobacco: Never  Vaping Use   Vaping Use: Never used  Substance and Sexual Activity   Alcohol use: No   Drug use: No   Sexual activity: Not on file  Other Topics Concern   Not on file  Social History Narrative   Not on file   Social Determinants of Health   Financial Resource Strain: Not on file  Food Insecurity: Not on file  Transportation Needs: Not on file  Physical Activity: Not on file  Stress: Not on file  Social Connections: Not on file  Intimate Partner Violence:  Not on file      Review of Systems  Constitutional:  Negative for appetite change, chills, fatigue and unexpected weight change.  HENT:  Negative for congestion, rhinorrhea, sneezing and sore throat.   Eyes:  Negative for redness.  Respiratory: Negative.  Negative for cough, chest tightness, shortness of breath and wheezing.   Cardiovascular: Negative.  Negative for chest pain and palpitations.  Gastrointestinal:  Positive for nausea and vomiting. Negative for abdominal pain, constipation and diarrhea.       Burning in chest/heartburn  Genitourinary:  Negative for dysuria and frequency.  Musculoskeletal:  Negative for arthralgias, back pain, joint swelling and neck pain.  Skin:  Negative for rash.  Neurological: Negative.  Negative for tremors and numbness.  Hematological:  Negative for adenopathy. Does not bruise/bleed easily.  Psychiatric/Behavioral:  Negative for behavioral problems (Depression), sleep disturbance and suicidal ideas. The patient is not nervous/anxious.     Vital Signs: BP (!) 141/75   Pulse 77   Temp 98.3 F (36.8 C)   Resp 16   Ht '5\' 3"'$  (1.6 m)   Wt 147 lb (66.7 kg)   SpO2 97%   BMI 26.04 kg/m    Physical Exam Vitals reviewed.  Constitutional:      General: She is not in acute distress.    Appearance: Normal appearance. She is normal weight. She is not ill-appearing.  HENT:     Head: Normocephalic and atraumatic.  Eyes:     Pupils: Pupils are equal, round, and reactive to light.  Cardiovascular:     Rate and Rhythm: Normal rate and regular rhythm.  Pulmonary:     Effort: Pulmonary effort is normal. No respiratory distress.  Abdominal:     General: Bowel sounds are normal. There is no distension.     Palpations: Abdomen is soft.     Tenderness: There is no abdominal tenderness. There is no guarding.     Hernia: No hernia is present.  Neurological:  Mental Status: She is alert and oriented to person, place, and time.     Cranial Nerves: No  cranial nerve deficit.     Gait: Gait normal.  Psychiatric:        Mood and Affect: Mood normal.        Behavior: Behavior normal.        Assessment/Plan: 1. Parkinson disease ([place]) Stable, managed by neurology, patient's caregiver is her husband, she is doing well.  2. Essential hypertension Blood pressure is stable with current medications, refills ordered. - metoprolol tartrate (LOPRESSOR) 25 MG tablet; Take 1 tablet (25 mg total) by mouth 2 (two) times daily with a meal.  Dispense: 180 tablet; Refill: 1 - amLODipine (NORVASC) 2.5 MG tablet; TAKE 1 TABLET BY MOUTH EVERY DAY - STOP LISINOPRIL  Dispense: 90 tablet; Refill: 2 - losartan (COZAAR) 100 MG tablet; Take 1 tablet (100 mg total) by mouth daily.  Dispense: 90 tablet; Refill: 1  3. Mixed stress and urge urinary incontinence Stable, refills ordered. - oxybutynin (DITROPAN-XL) 10 MG 24 hr tablet; Take 2 tablets (20 mg total) by mouth daily.  Dispense: 180 tablet; Refill: 1  4. Hiatal hernia with gastroesophageal reflux Stable, refills ordered.   - pantoprazole (PROTONIX) 40 MG tablet; Take 1 tablet (40 mg total) by mouth 2 (two) times daily.  Dispense: 180 tablet; Refill: 1  5. Hypokalemia Patient takes medications that can cause low potassium, potassium supplement reordered - potassium chloride SA (KLOR-CON M) 20 MEQ tablet; Take 1 tablet (20 mEq total) by mouth daily.  Dispense: 90 tablet; Refill: 1  6. Mixed hyperlipidemia Stable, refills ordered. - simvastatin (ZOCOR) 20 MG tablet; TAKE 1 TABLET BY MOUTH DAILY AT 6 PM.  Dispense: 90 tablet; Refill: 1  7. GAD (generalized anxiety disorder) Anxiety is well controlled with current medications, no changes, refills ordered. - escitalopram (LEXAPRO) 20 MG tablet; Take 1 tablet (20 mg total) by mouth daily.  Dispense: 90 tablet; Refill: 1 - buPROPion (WELLBUTRIN XL) 150 MG 24 hr tablet; Take 1 tablet (150 mg total) by mouth daily.  Dispense: 90 tablet; Refill:  1   General Counseling: Roslynn verbalizes understanding of the findings of todays visit and agrees with plan of treatment. I have discussed any further diagnostic evaluation that may be needed or ordered today. We also reviewed her medications today. she has been encouraged to call the office with any questions or concerns that should arise related to todays visit.    No orders of the defined types were placed in this encounter.   Meds ordered this encounter  Medications   pantoprazole (PROTONIX) 40 MG tablet    Sig: Take 1 tablet (40 mg total) by mouth 2 (two) times daily.    Dispense:  180 tablet    Refill:  1   metoprolol tartrate (LOPRESSOR) 25 MG tablet    Sig: Take 1 tablet (25 mg total) by mouth 2 (two) times daily with a meal.    Dispense:  180 tablet    Refill:  1   oxybutynin (DITROPAN-XL) 10 MG 24 hr tablet    Sig: Take 2 tablets (20 mg total) by mouth daily.    Dispense:  180 tablet    Refill:  1   escitalopram (LEXAPRO) 20 MG tablet    Sig: Take 1 tablet (20 mg total) by mouth daily.    Dispense:  90 tablet    Refill:  1   potassium chloride SA (KLOR-CON M) 20 MEQ tablet  Sig: Take 1 tablet (20 mEq total) by mouth daily.    Dispense:  90 tablet    Refill:  1   buPROPion (WELLBUTRIN XL) 150 MG 24 hr tablet    Sig: Take 1 tablet (150 mg total) by mouth daily.    Dispense:  90 tablet    Refill:  1   simvastatin (ZOCOR) 20 MG tablet    Sig: TAKE 1 TABLET BY MOUTH DAILY AT 6 PM.    Dispense:  90 tablet    Refill:  1   HYDROcodone-acetaminophen (NORCO) 5-325 MG tablet    Sig: Take 1 tablet by mouth every 4 (four) hours as needed for moderate pain or severe pain. No more than 6 tablets per 24 hours    Dispense:  30 tablet    Refill:  0   HYDROcodone-acetaminophen (NORCO) 5-325 MG tablet    Sig: Take 1 tablet by mouth every 4 (four) hours as needed for moderate pain or severe pain. No more than 6 tablets per 24 hours    Dispense:  30 tablet    Refill:  0    Fill  for july   amLODipine (NORVASC) 2.5 MG tablet    Sig: TAKE 1 TABLET BY MOUTH EVERY DAY - STOP LISINOPRIL    Dispense:  90 tablet    Refill:  2   losartan (COZAAR) 100 MG tablet    Sig: Take 1 tablet (100 mg total) by mouth daily.    Dispense:  90 tablet    Refill:  1    Return in about 3 months (around 09/26/2021) for F/U, Caelen Higinbotham PCP.   Total time spent:30 Minutes Time spent includes review of chart, medications, test results, and follow up plan with the patient.   [place] Controlled Substance Database was reviewed by me.  This patient was seen by Jonetta Osgood, FNP-C in collaboration with Dr. Clayborn Bigness as a part of collaborative care agreement.   Erlean Mealor R. Valetta Fuller, MSN, FNP-C Internal medicine

## 2021-06-28 ENCOUNTER — Ambulatory Visit: Payer: Medicare Other

## 2021-06-28 DIAGNOSIS — R2681 Unsteadiness on feet: Secondary | ICD-10-CM

## 2021-06-28 DIAGNOSIS — G20 Parkinson's disease: Secondary | ICD-10-CM | POA: Diagnosis not present

## 2021-06-28 DIAGNOSIS — M6281 Muscle weakness (generalized): Secondary | ICD-10-CM

## 2021-06-28 DIAGNOSIS — R278 Other lack of coordination: Secondary | ICD-10-CM

## 2021-06-28 DIAGNOSIS — R262 Difficulty in walking, not elsewhere classified: Secondary | ICD-10-CM | POA: Diagnosis not present
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
## 2021-06-29 ENCOUNTER — Ambulatory Visit: Payer: Medicare Other

## 2021-06-29 ENCOUNTER — Telehealth: Payer: Self-pay

## 2021-06-29 DIAGNOSIS — M26609 Unspecified temporomandibular joint disorder, unspecified side: Secondary | ICD-10-CM

## 2021-07-03 ENCOUNTER — Telehealth: Payer: Self-pay

## 2021-07-03 ENCOUNTER — Ambulatory Visit: Payer: Medicare Other

## 2021-07-03 DIAGNOSIS — M6281 Muscle weakness (generalized): Secondary | ICD-10-CM | POA: Diagnosis not present

## 2021-07-03 DIAGNOSIS — R278 Other lack of coordination: Secondary | ICD-10-CM | POA: Diagnosis not present

## 2021-07-03 DIAGNOSIS — G20 Parkinson's disease: Secondary | ICD-10-CM | POA: Diagnosis not present

## 2021-07-03 DIAGNOSIS — R262 Difficulty in walking, not elsewhere classified: Secondary | ICD-10-CM | POA: Diagnosis not present

## 2021-07-03 DIAGNOSIS — R2681 Unsteadiness on feet: Secondary | ICD-10-CM

## 2021-07-03 MED ORDER — HYDROCODONE-ACETAMINOPHEN 5-325 MG PO TABS: 1.0000 | ORAL_TABLET | ORAL | 0 refills | Status: DC | PRN
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
## 2021-07-03 NOTE — Telephone Encounter (Signed)
Pt advised that jury duty letter is ready for pickup

## 2021-07-05 ENCOUNTER — Ambulatory Visit: Payer: Medicare Other

## 2021-07-05 DIAGNOSIS — R2681 Unsteadiness on feet: Secondary | ICD-10-CM

## 2021-07-05 DIAGNOSIS — G20A1 Parkinson's disease without dyskinesia, without mention of fluctuations: Secondary | ICD-10-CM

## 2021-07-05 DIAGNOSIS — R278 Other lack of coordination: Secondary | ICD-10-CM

## 2021-07-05 DIAGNOSIS — R262 Difficulty in walking, not elsewhere classified: Secondary | ICD-10-CM | POA: Diagnosis not present

## 2021-07-05 DIAGNOSIS — G20 Parkinson's disease: Secondary | ICD-10-CM

## 2021-07-05 DIAGNOSIS — M6281 Muscle weakness (generalized): Secondary | ICD-10-CM | POA: Diagnosis not present
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
## 2021-07-05 NOTE — Telephone Encounter (Signed)
Pt received meds

## 2021-07-06 ENCOUNTER — Ambulatory Visit: Payer: Medicare Other

## 2021-07-09 ENCOUNTER — Ambulatory Visit: Payer: Medicare Other

## 2021-07-09 DIAGNOSIS — G20A1 Parkinson's disease without dyskinesia, without mention of fluctuations: Secondary | ICD-10-CM

## 2021-07-09 DIAGNOSIS — M6281 Muscle weakness (generalized): Secondary | ICD-10-CM

## 2021-07-09 DIAGNOSIS — R278 Other lack of coordination: Secondary | ICD-10-CM | POA: Diagnosis not present

## 2021-07-09 DIAGNOSIS — G20 Parkinson's disease: Secondary | ICD-10-CM

## 2021-07-09 DIAGNOSIS — R262 Difficulty in walking, not elsewhere classified: Secondary | ICD-10-CM

## 2021-07-09 DIAGNOSIS — R2681 Unsteadiness on feet: Secondary | ICD-10-CM | POA: Diagnosis not present
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
## 2021-07-12 ENCOUNTER — Ambulatory Visit: Payer: Medicare Other

## 2021-07-12 DIAGNOSIS — R278 Other lack of coordination: Secondary | ICD-10-CM | POA: Diagnosis not present

## 2021-07-12 DIAGNOSIS — R262 Difficulty in walking, not elsewhere classified: Secondary | ICD-10-CM | POA: Diagnosis not present

## 2021-07-12 DIAGNOSIS — R2681 Unsteadiness on feet: Secondary | ICD-10-CM

## 2021-07-12 DIAGNOSIS — G20 Parkinson's disease: Secondary | ICD-10-CM

## 2021-07-12 DIAGNOSIS — M6281 Muscle weakness (generalized): Secondary | ICD-10-CM | POA: Diagnosis not present

## 2021-07-13 ENCOUNTER — Ambulatory Visit: Payer: Medicare Other

## 2021-07-16 ENCOUNTER — Ambulatory Visit: Payer: Medicare Other

## 2021-07-17 ENCOUNTER — Ambulatory Visit: Payer: Medicare Other

## 2021-07-17 DIAGNOSIS — R2681 Unsteadiness on feet: Secondary | ICD-10-CM

## 2021-07-17 DIAGNOSIS — R262 Difficulty in walking, not elsewhere classified: Secondary | ICD-10-CM | POA: Diagnosis not present

## 2021-07-17 DIAGNOSIS — G20 Parkinson's disease: Secondary | ICD-10-CM

## 2021-07-17 DIAGNOSIS — R278 Other lack of coordination: Secondary | ICD-10-CM | POA: Diagnosis not present

## 2021-07-17 DIAGNOSIS — M6281 Muscle weakness (generalized): Secondary | ICD-10-CM | POA: Diagnosis not present

## 2021-07-18 ENCOUNTER — Telehealth: Payer: Self-pay

## 2021-07-18 NOTE — Telephone Encounter (Signed)
Pt called and I informed her that she missed her nurse visit on 07/16/21 for a UA. I asked patient if she was having redness, pain and any rash or blisters in the area she advised she had pain at on Monday and pt stated that there was no rash or blisters and that her pain seemed to be a little better.  Pt has appt on 07/19/21 and I advised to pt that we were thinking she may had been developing shingles as the pt stated on Monday it felt like it did when she had shingles before, so since it was better I advised that we will wait on sending in the [place] and can discuss with Dr Humphrey Rolls at visit on 07/19/21

## 2021-07-19 ENCOUNTER — Encounter: Payer: Self-pay | Admitting: Internal Medicine

## 2021-07-19 ENCOUNTER — Ambulatory Visit (INDEPENDENT_AMBULATORY_CARE_PROVIDER_SITE_OTHER): Payer: Medicare Other | Admitting: Internal Medicine

## 2021-07-19 ENCOUNTER — Ambulatory Visit: Payer: Medicare Other

## 2021-07-19 VITALS — BP 123/77 | HR 76 | Temp 97.6°F | Resp 16 | Ht 63.0 in | Wt 148.6 lb

## 2021-07-19 DIAGNOSIS — R2681 Unsteadiness on feet: Secondary | ICD-10-CM

## 2021-07-19 DIAGNOSIS — G14 Postpolio syndrome: Secondary | ICD-10-CM

## 2021-07-19 DIAGNOSIS — R300 Dysuria: Secondary | ICD-10-CM | POA: Diagnosis not present

## 2021-07-19 DIAGNOSIS — G20 Parkinson's disease: Secondary | ICD-10-CM

## 2021-07-19 DIAGNOSIS — M6281 Muscle weakness (generalized): Secondary | ICD-10-CM | POA: Diagnosis not present

## 2021-07-19 DIAGNOSIS — R278 Other lack of coordination: Secondary | ICD-10-CM | POA: Diagnosis not present

## 2021-07-19 DIAGNOSIS — R109 Unspecified abdominal pain: Secondary | ICD-10-CM | POA: Diagnosis not present

## 2021-07-19 DIAGNOSIS — R262 Difficulty in walking, not elsewhere classified: Secondary | ICD-10-CM | POA: Diagnosis not present

## 2021-07-19 LAB — POCT URINALYSIS DIPSTICK
Bilirubin, UA: NEGATIVE
Blood, UA: NEGATIVE
Glucose, UA: NEGATIVE
Ketones, UA: NEGATIVE
Nitrite, UA: NEGATIVE
Protein, UA: POSITIVE — AB
Spec Grav, UA: 1.01 (ref 1.010–1.025)
Urobilinogen, UA: 0.2 E.U./dL
pH, UA: 6 (ref 5.0–8.0)

## 2021-07-19 MED ORDER — NITROFURANTOIN MONOHYD MACRO 100 MG PO CAPS: ORAL_CAPSULE | ORAL | 0 refills | Status: DC
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
## 2021-07-19 NOTE — Progress Notes (Signed)
[facility] [place], [place] [address]  Internal MEDICINE  Office Visit Note  Patient Name: Susan Shah  [id number]  [id number]  Date of Service: 07/19/2021  Chief Complaint  Patient presents with   Back Pain    Lower back pain that reaches around patient's side, near stomach - has been ongoing for a few weeks     HPI Pt is here for a sick visit. Patient is complaining of lower back pain mostly located on the right side, thinks this feels like a UTI.. She does use a cane and has postpolio syndrome with weakness of that right side of the leg Denies any fever and chills   Current Medication:  Outpatient Encounter Medications as of 07/19/2021  Medication Sig   acetaminophen (TYLENOL) 500 MG tablet Take 1,000 mg by mouth every 6 (six) hours as needed for moderate pain or headache.   amLODipine (NORVASC) 2.5 MG tablet TAKE 1 TABLET BY MOUTH EVERY DAY - STOP LISINOPRIL   buPROPion (WELLBUTRIN XL) 150 MG 24 hr tablet Take 1 tablet (150 mg total) by mouth daily.   carbidopa-levodopa (SINEMET) 25-100 MG tablet Take one tab po qpm   Carboxymethylcellul-Glycerin (LUBRICATING EYE DROPS OP) Place 1 drop into both eyes daily as needed (dry eyes).   cholecalciferol (VITAMIN D3) 25 MCG (1000 UNIT) tablet Take 1,000 Units by mouth daily.   escitalopram (LEXAPRO) 20 MG tablet Take 1 tablet (20 mg total) by mouth daily.   famotidine (PEPCID) 20 MG tablet TAKE 1 TABLET (20 MG TOTAL) BY MOUTH 2 (TWO) TIMES DAILY AS NEEDED FOR HEARTBURN OR INDIGESTION.   fluticasone (FLONASE) 50 MCG/ACT nasal spray Place 1 spray into both nostrils at bedtime as needed for allergies or rhinitis.   HYDROcodone-acetaminophen (NORCO) 5-325 MG tablet Take 1 tablet by mouth every 4 (four) hours as needed for moderate pain or severe pain. No more than 6 tablets per 24 hours   [START ON 07/31/2021] HYDROcodone-acetaminophen (NORCO) 5-325 MG tablet Take 1 tablet by mouth every 4 (four) hours as  needed for moderate pain or severe pain. No more than 6 tablets per 24 hours   loratadine (QC LORATADINE ALLERGY RELIEF) 10 MG tablet Take 1 tablet (10 mg total) by mouth daily.   losartan (COZAAR) 100 MG tablet Take 1 tablet (100 mg total) by mouth daily.   metoprolol tartrate (LOPRESSOR) 25 MG tablet Take 1 tablet (25 mg total) by mouth 2 (two) times daily with a meal.   ondansetron (ZOFRAN-ODT) 4 MG disintegrating tablet Take 1 tablet (4 mg total) by mouth every 8 (eight) hours as needed for nausea or vomiting.   oxybutynin (DITROPAN-XL) 10 MG 24 hr tablet Take 2 tablets (20 mg total) by mouth daily.   pantoprazole (PROTONIX) 40 MG tablet Take 1 tablet (40 mg total) by mouth 2 (two) times daily.   potassium chloride SA (KLOR-CON M) 20 MEQ tablet Take 1 tablet (20 mEq total) by mouth daily.   rOPINIRole (REQUIP) 0.25 MG tablet Take 1 tablet (0.25 mg total) by mouth 3 (three) times daily.   simvastatin (ZOCOR) 20 MG tablet TAKE 1 TABLET BY MOUTH DAILY AT 6 PM.   vitamin B-12 (CYANOCOBALAMIN) 1000 MCG tablet Take 1,000 mcg by mouth daily.   No facility-administered encounter medications on file as of 07/19/2021.      Medical History: Past Medical History:  Diagnosis Date   Anxiety    Asthma 2015   mild, seasonal allergy triggered.   Cancer [facility]) 2013  skin cancer  on left hand   Depression    Esophageal dysmotility    GERD (gastroesophageal reflux disease) 11/05/2012   History of hiatal hernia    Hyperlipidemia    Hypertension    Incontinence of urine    Lung nodule    OA (osteoarthritis)    Obesity    Parkinson's disease (HCC)    PONV (postoperative nausea and vomiting)    Post-polio syndrome    contracted at 40 months old   Pulmonary nodule 11/05/2012   RML   Shingles 2009   Sleep apnea      Vital Signs: BP 123/77   Pulse 76   Temp 97.6 F (36.4 C)   Resp 16   Ht '5\' 3"'$  (1.6 m)   Wt 148 lb 9.6 oz (67.4 kg)   SpO2 98%   BMI 26.32 kg/m    Review of Systems   Constitutional:  Negative for fatigue and fever.  HENT:  Negative for congestion, mouth sores and postnasal drip.   Respiratory:  Negative for cough.   Cardiovascular:  Negative for chest pain.  Genitourinary:  Positive for urgency. Negative for flank pain.  Musculoskeletal:  Positive for back pain.  Psychiatric/Behavioral: Negative.      Physical Exam Constitutional:      Appearance: Normal appearance.  HENT:     Head: Normocephalic and atraumatic.     Nose: Nose normal.     Mouth/Throat:     Mouth: Mucous membranes are moist.     Pharynx: No posterior oropharyngeal erythema.  Eyes:     Extraocular Movements: Extraocular movements intact.     Pupils: Pupils are equal, round, and reactive to light.  Cardiovascular:     Pulses: Normal pulses.     Heart sounds: Normal heart sounds.  Pulmonary:     Effort: Pulmonary effort is normal.     Breath sounds: Normal breath sounds.  Musculoskeletal:        General: No tenderness.  Neurological:     General: No focal deficit present.     Mental Status: She is alert.  Psychiatric:        Mood and Affect: Mood normal.        Behavior: Behavior normal.      Assessment/Plan: 1. Right flank pain Unable to assess if this is musculoskeletal or renal.  Will wait for urine cx  2. Dysuria - POCT Urinalysis Dipstick - CULTURE, URINE COMPREHENSIVE  3. Post-polio syndrome Might need to get xray of right hip if continues to have concerns    General Counseling: Susan Shah verbalizes understanding of the findings of todays visit and agrees with plan of treatment. I have discussed any further diagnostic evaluation that may be needed or ordered today. We also reviewed her medications today. she has been encouraged to call the office with any questions or concerns that should arise related to todays visit.    Counseling:    Orders Placed This Encounter  Procedures   CULTURE, URINE COMPREHENSIVE   POCT Urinalysis Dipstick    No orders of  the defined types were placed in this encounter.   [place] Controlled Substance Database was reviewed by me.  Time spent:30 Minutes

## 2021-07-20 ENCOUNTER — Ambulatory Visit: Payer: Medicare Other

## 2021-07-24 LAB — CULTURE, URINE COMPREHENSIVE

## 2021-07-25 ENCOUNTER — Ambulatory Visit: Payer: Medicare Other

## 2021-07-27 ENCOUNTER — Telehealth: Payer: Self-pay

## 2021-07-27 ENCOUNTER — Ambulatory Visit: Payer: Medicare Other

## 2021-07-27 NOTE — Telephone Encounter (Signed)
Pt called that macrobid make her nausea and vomiting advised her to stopped and advised her that her urine culture had no growth and pt don't have symptoms as per Dfk advised pt to stopped med and drink plenty of water

## 2021-07-30 ENCOUNTER — Ambulatory Visit: Payer: Medicare Other

## 2021-07-31 ENCOUNTER — Ambulatory Visit: Payer: Medicare Other

## 2021-08-01 ENCOUNTER — Ambulatory Visit: Payer: Medicare Other

## 2021-08-02 ENCOUNTER — Ambulatory Visit: Payer: Medicare Other | Attending: Neurology

## 2021-08-02 DIAGNOSIS — G20 Parkinson's disease: Secondary | ICD-10-CM | POA: Insufficient documentation

## 2021-08-02 DIAGNOSIS — R2681 Unsteadiness on feet: Secondary | ICD-10-CM | POA: Diagnosis not present

## 2021-08-02 DIAGNOSIS — M6281 Muscle weakness (generalized): Secondary | ICD-10-CM | POA: Diagnosis not present

## 2021-08-02 DIAGNOSIS — R262 Difficulty in walking, not elsewhere classified: Secondary | ICD-10-CM | POA: Diagnosis not present

## 2021-08-02 DIAGNOSIS — R278 Other lack of coordination: Secondary | ICD-10-CM | POA: Insufficient documentation
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
## 2021-08-03 ENCOUNTER — Ambulatory Visit: Payer: Medicare Other

## 2021-08-06 ENCOUNTER — Ambulatory Visit: Payer: Medicare Other | Admitting: Internal Medicine

## 2021-08-06 ENCOUNTER — Ambulatory Visit: Payer: Medicare Other

## 2021-08-07 ENCOUNTER — Ambulatory Visit: Payer: Medicare Other

## 2021-08-07 DIAGNOSIS — R2681 Unsteadiness on feet: Secondary | ICD-10-CM

## 2021-08-07 DIAGNOSIS — R278 Other lack of coordination: Secondary | ICD-10-CM

## 2021-08-07 DIAGNOSIS — G20 Parkinson's disease: Secondary | ICD-10-CM

## 2021-08-07 DIAGNOSIS — R262 Difficulty in walking, not elsewhere classified: Secondary | ICD-10-CM

## 2021-08-07 DIAGNOSIS — M6281 Muscle weakness (generalized): Secondary | ICD-10-CM
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
## 2021-08-08 ENCOUNTER — Ambulatory Visit: Payer: Medicare Other

## 2021-08-08 DIAGNOSIS — R262 Difficulty in walking, not elsewhere classified: Secondary | ICD-10-CM | POA: Diagnosis not present

## 2021-08-08 DIAGNOSIS — G20 Parkinson's disease: Secondary | ICD-10-CM | POA: Diagnosis not present

## 2021-08-08 DIAGNOSIS — M6281 Muscle weakness (generalized): Secondary | ICD-10-CM | POA: Diagnosis not present

## 2021-08-08 DIAGNOSIS — R2681 Unsteadiness on feet: Secondary | ICD-10-CM | POA: Diagnosis not present

## 2021-08-08 DIAGNOSIS — R278 Other lack of coordination: Secondary | ICD-10-CM
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
## 2021-08-10 ENCOUNTER — Ambulatory Visit: Payer: Medicare Other

## 2021-08-13 ENCOUNTER — Ambulatory Visit: Payer: Medicare Other

## 2021-08-14 ENCOUNTER — Ambulatory Visit: Payer: Medicare Other

## 2021-08-14 DIAGNOSIS — M6281 Muscle weakness (generalized): Secondary | ICD-10-CM | POA: Diagnosis not present

## 2021-08-14 DIAGNOSIS — R278 Other lack of coordination: Secondary | ICD-10-CM | POA: Diagnosis not present

## 2021-08-14 DIAGNOSIS — R262 Difficulty in walking, not elsewhere classified: Secondary | ICD-10-CM | POA: Diagnosis not present

## 2021-08-14 DIAGNOSIS — G20 Parkinson's disease: Secondary | ICD-10-CM

## 2021-08-14 DIAGNOSIS — R2681 Unsteadiness on feet: Secondary | ICD-10-CM | POA: Diagnosis not present
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
## 2021-08-16 ENCOUNTER — Ambulatory Visit: Payer: Medicare Other

## 2021-08-16 DIAGNOSIS — M6281 Muscle weakness (generalized): Secondary | ICD-10-CM

## 2021-08-16 DIAGNOSIS — G20 Parkinson's disease: Secondary | ICD-10-CM | POA: Diagnosis not present

## 2021-08-16 DIAGNOSIS — R262 Difficulty in walking, not elsewhere classified: Secondary | ICD-10-CM | POA: Diagnosis not present

## 2021-08-16 DIAGNOSIS — R278 Other lack of coordination: Secondary | ICD-10-CM | POA: Diagnosis not present

## 2021-08-16 DIAGNOSIS — R2681 Unsteadiness on feet: Secondary | ICD-10-CM | POA: Diagnosis not present
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
## 2021-08-19 ENCOUNTER — Encounter: Payer: Self-pay | Admitting: Nurse Practitioner

## 2021-08-20 ENCOUNTER — Ambulatory Visit: Payer: Medicare Other

## 2021-08-22 ENCOUNTER — Ambulatory Visit: Payer: Medicare Other | Attending: Neurology

## 2021-08-22 DIAGNOSIS — G20 Parkinson's disease: Secondary | ICD-10-CM | POA: Diagnosis not present

## 2021-08-22 DIAGNOSIS — R262 Difficulty in walking, not elsewhere classified: Secondary | ICD-10-CM | POA: Diagnosis not present

## 2021-08-22 DIAGNOSIS — G20A1 Parkinson's disease without dyskinesia, without mention of fluctuations: Secondary | ICD-10-CM

## 2021-08-22 DIAGNOSIS — R2681 Unsteadiness on feet: Secondary | ICD-10-CM | POA: Diagnosis not present

## 2021-08-22 DIAGNOSIS — M6281 Muscle weakness (generalized): Secondary | ICD-10-CM | POA: Diagnosis not present

## 2021-08-22 DIAGNOSIS — R278 Other lack of coordination: Secondary | ICD-10-CM | POA: Diagnosis not present
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
## 2021-08-24 ENCOUNTER — Ambulatory Visit: Payer: Medicare Other

## 2021-08-27 ENCOUNTER — Ambulatory Visit: Payer: Medicare Other

## 2021-08-29 ENCOUNTER — Ambulatory Visit: Payer: Medicare Other

## 2021-08-29 DIAGNOSIS — G20 Parkinson's disease: Secondary | ICD-10-CM | POA: Diagnosis not present

## 2021-08-29 DIAGNOSIS — R262 Difficulty in walking, not elsewhere classified: Secondary | ICD-10-CM | POA: Diagnosis not present

## 2021-08-29 DIAGNOSIS — G20A1 Parkinson's disease without dyskinesia, without mention of fluctuations: Secondary | ICD-10-CM

## 2021-08-29 DIAGNOSIS — M6281 Muscle weakness (generalized): Secondary | ICD-10-CM

## 2021-08-29 DIAGNOSIS — R278 Other lack of coordination: Secondary | ICD-10-CM | POA: Diagnosis not present

## 2021-08-29 DIAGNOSIS — R2681 Unsteadiness on feet: Secondary | ICD-10-CM
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
## 2021-09-04 ENCOUNTER — Ambulatory Visit: Payer: Medicare Other

## 2021-09-04 DIAGNOSIS — R262 Difficulty in walking, not elsewhere classified: Secondary | ICD-10-CM | POA: Diagnosis not present

## 2021-09-04 DIAGNOSIS — M6281 Muscle weakness (generalized): Secondary | ICD-10-CM

## 2021-09-04 DIAGNOSIS — R278 Other lack of coordination: Secondary | ICD-10-CM

## 2021-09-04 DIAGNOSIS — R2681 Unsteadiness on feet: Secondary | ICD-10-CM | POA: Diagnosis not present

## 2021-09-04 DIAGNOSIS — G20 Parkinson's disease: Secondary | ICD-10-CM
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
## 2021-09-06 ENCOUNTER — Ambulatory Visit: Payer: Medicare Other

## 2021-09-10 ENCOUNTER — Ambulatory Visit: Payer: Medicare Other

## 2021-09-10 DIAGNOSIS — G20 Parkinson's disease: Secondary | ICD-10-CM | POA: Diagnosis not present

## 2021-09-10 DIAGNOSIS — R262 Difficulty in walking, not elsewhere classified: Secondary | ICD-10-CM

## 2021-09-10 DIAGNOSIS — R278 Other lack of coordination: Secondary | ICD-10-CM | POA: Diagnosis not present

## 2021-09-10 DIAGNOSIS — M6281 Muscle weakness (generalized): Secondary | ICD-10-CM | POA: Diagnosis not present

## 2021-09-10 DIAGNOSIS — R2681 Unsteadiness on feet: Secondary | ICD-10-CM
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
## 2021-09-12 ENCOUNTER — Ambulatory Visit: Payer: Medicare Other

## 2021-09-12 DIAGNOSIS — R278 Other lack of coordination: Secondary | ICD-10-CM | POA: Diagnosis not present

## 2021-09-12 DIAGNOSIS — G20 Parkinson's disease: Secondary | ICD-10-CM | POA: Diagnosis not present

## 2021-09-12 DIAGNOSIS — M6281 Muscle weakness (generalized): Secondary | ICD-10-CM

## 2021-09-12 DIAGNOSIS — R2681 Unsteadiness on feet: Secondary | ICD-10-CM | POA: Diagnosis not present

## 2021-09-12 DIAGNOSIS — R262 Difficulty in walking, not elsewhere classified: Secondary | ICD-10-CM | POA: Diagnosis not present
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
## 2021-09-19 ENCOUNTER — Ambulatory Visit: Payer: Medicare Other

## 2021-09-19 DIAGNOSIS — M6281 Muscle weakness (generalized): Secondary | ICD-10-CM

## 2021-09-19 DIAGNOSIS — R278 Other lack of coordination: Secondary | ICD-10-CM | POA: Diagnosis not present

## 2021-09-19 DIAGNOSIS — R262 Difficulty in walking, not elsewhere classified: Secondary | ICD-10-CM

## 2021-09-19 DIAGNOSIS — G20 Parkinson's disease: Secondary | ICD-10-CM

## 2021-09-19 DIAGNOSIS — R2681 Unsteadiness on feet: Secondary | ICD-10-CM

## 2021-09-19 DIAGNOSIS — S22000A Wedge compression fracture of unspecified thoracic vertebra, initial encounter for closed fracture: Secondary | ICD-10-CM | POA: Diagnosis not present

## 2021-09-19 DIAGNOSIS — M5489 Other dorsalgia: Secondary | ICD-10-CM | POA: Diagnosis not present
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
## 2021-09-20 ENCOUNTER — Other Ambulatory Visit: Payer: Self-pay | Admitting: Orthopedic Surgery

## 2021-09-20 DIAGNOSIS — S22000A Wedge compression fracture of unspecified thoracic vertebra, initial encounter for closed fracture: Secondary | ICD-10-CM

## 2021-09-25 ENCOUNTER — Encounter: Payer: Self-pay | Admitting: Nurse Practitioner

## 2021-09-25 ENCOUNTER — Ambulatory Visit (INDEPENDENT_AMBULATORY_CARE_PROVIDER_SITE_OTHER): Payer: Medicare Other | Admitting: Nurse Practitioner

## 2021-09-25 VITALS — BP 140/65 | HR 74 | Temp 98.3°F | Resp 16 | Ht 63.0 in | Wt 144.6 lb

## 2021-09-25 DIAGNOSIS — R229 Localized swelling, mass and lump, unspecified: Secondary | ICD-10-CM | POA: Diagnosis not present

## 2021-09-25 DIAGNOSIS — G20 Parkinson's disease: Secondary | ICD-10-CM

## 2021-09-25 DIAGNOSIS — I10 Essential (primary) hypertension: Secondary | ICD-10-CM | POA: Diagnosis not present

## 2021-09-25 DIAGNOSIS — M159 Polyosteoarthritis, unspecified: Secondary | ICD-10-CM | POA: Diagnosis not present

## 2021-09-25 DIAGNOSIS — G14 Postpolio syndrome: Secondary | ICD-10-CM | POA: Diagnosis not present

## 2021-09-25 DIAGNOSIS — Z23 Encounter for immunization: Secondary | ICD-10-CM | POA: Diagnosis not present

## 2021-09-25 NOTE — Progress Notes (Signed)
[facility] [place], [place] [address]  Internal MEDICINE  Office Visit Note  Patient Name: Susan Shah  [id number]  [id number]  Date of Service: 09/25/2021  Chief Complaint  Patient presents with  . Follow-up  . Hypertension  . Hyperlipidemia  . Gastroesophageal Reflux  . Depression    HPI Anah presents for a routine follow-up visit for postpolio syndrome, Parkinson disease, chronic pain management and localized pain near the right axilla. Postpolio syndrome --continues to be followed by neurology and has regular physical therapy sessions Parkinson's disease --currently taking carbidopa-levodopa and ropinirole and managed by neurology.  Has been attending regular physical therapy sessions and has shown improvement in her stance and gait.  She is less shaky, more steady on her feet and has some increased strength in her lower extremities. Chronic pain management -- Continues to take hydrocodone-acetaminophen 5-325 mg every 4 hours as needed for severe pain.  The chronic pain is currently tolerable with the as needed pain medication.  She still has to 1 month prescriptions of the medication at the pharmacy if she needs it.  Currently seeing a specialist.  Will be getting an MRI of her lower back saying and he may end up needing spinal fusion surgery. Localized pain/tenderness near the right axilla directly below the lower aspect of the scapula --area has been a cause of chronic pain for the patient since her surgery 6 years ago.  Reports that no one has been able to figure out what is causing the pain or why.  She reports not having any targeted imaging of the area.      Current Medication: Outpatient Encounter Medications as of 09/25/2021  Medication Sig  . acetaminophen (TYLENOL) 500 MG tablet Take 1,000 mg by mouth every 6 (six) hours as needed for moderate pain or headache.  Marland Kitchen amLODipine (NORVASC) 2.5 MG tablet TAKE 1 TABLET BY MOUTH EVERY DAY - STOP  LISINOPRIL  . buPROPion (WELLBUTRIN XL) 150 MG 24 hr tablet Take 1 tablet (150 mg total) by mouth daily.  . carbidopa-levodopa (SINEMET) 25-100 MG tablet Take one tab po qpm  . Carboxymethylcellul-Glycerin (LUBRICATING EYE DROPS OP) Place 1 drop into both eyes daily as needed (dry eyes).  . cholecalciferol (VITAMIN D3) 25 MCG (1000 UNIT) tablet Take 1,000 Units by mouth daily.  Marland Kitchen escitalopram (LEXAPRO) 20 MG tablet Take 1 tablet (20 mg total) by mouth daily.  . famotidine (PEPCID) 20 MG tablet TAKE 1 TABLET (20 MG TOTAL) BY MOUTH 2 (TWO) TIMES DAILY AS NEEDED FOR HEARTBURN OR INDIGESTION.  . fluticasone (FLONASE) 50 MCG/ACT nasal spray Place 1 spray into both nostrils at bedtime as needed for allergies or rhinitis.  Marland Kitchen HYDROcodone-acetaminophen (NORCO) 5-325 MG tablet Take 1 tablet by mouth every 4 (four) hours as needed for moderate pain or severe pain. No more than 6 tablets per 24 hours  . HYDROcodone-acetaminophen (NORCO) 5-325 MG tablet Take 1 tablet by mouth every 4 (four) hours as needed for moderate pain or severe pain. No more than 6 tablets per 24 hours  . loratadine (QC LORATADINE ALLERGY RELIEF) 10 MG tablet Take 1 tablet (10 mg total) by mouth daily.  Marland Kitchen losartan (COZAAR) 100 MG tablet Take 1 tablet (100 mg total) by mouth daily.  . metoprolol tartrate (LOPRESSOR) 25 MG tablet Take 1 tablet (25 mg total) by mouth 2 (two) times daily with a meal.  . ondansetron (ZOFRAN-ODT) 4 MG disintegrating tablet Take 1 tablet (4 mg total) by mouth every  8 (eight) hours as needed for nausea or vomiting.  Marland Kitchen oxybutynin (DITROPAN-XL) 10 MG 24 hr tablet Take 2 tablets (20 mg total) by mouth daily.  . pantoprazole (PROTONIX) 40 MG tablet Take 1 tablet (40 mg total) by mouth 2 (two) times daily.  . potassium chloride SA (KLOR-CON M) 20 MEQ tablet Take 1 tablet (20 mEq total) by mouth daily.  Marland Kitchen rOPINIRole (REQUIP) 0.25 MG tablet Take 1 tablet (0.25 mg total) by mouth 3 (three) times daily.  . simvastatin  (ZOCOR) 20 MG tablet TAKE 1 TABLET BY MOUTH DAILY AT 6 PM.  . vitamin B-12 (CYANOCOBALAMIN) 1000 MCG tablet Take 1,000 mcg by mouth daily.   No facility-administered encounter medications on file as of 09/25/2021.    Surgical History: Past Surgical History:  Procedure Laterality Date  . [place] STUDY N/A 04/24/2016   Procedure: 71 HOUR PH STUDY;  Surgeon: Lucilla Lame, MD;  Location: [facility] ENDOSCOPY;  Service: Endoscopy;  Laterality: N/A;  . ABDOMINAL HYSTERECTOMY     Total  . APPENDECTOMY    . CARPOMETACARPAL ([place]) FUSION OF THUMB Left 05/16/2016   Procedure: CARPOMETACARPAL [facility]) FUSION OF THUMB;  Surgeon: Hessie Knows, MD;  Location: [facility] ORS;  Service: Orthopedics;  Laterality: Left;  . CARPOMETACARPAL ([place]) FUSION OF THUMB Left 03/30/2019   Procedure: LEFT THUMB SUSPENSION PLASTY;  Surgeon: Hessie Knows, MD;  Location: [facility] ORS;  Service: Orthopedics;  Laterality: Left;  . CATARACT EXTRACTION Bilateral 12/2012  . CHOLECYSTECTOMY    . COLONOSCOPY  2003  . ESOPHAGEAL MANOMETRY N/A 04/24/2016   Procedure: ESOPHAGEAL MANOMETRY (EM);  Surgeon: Lucilla Lame, MD;  Location: [facility] END[facility];  Service: Endoscopy;  Laterality: N/A;  . ESOPHAGOGASTRODUODENOSCOPY  08/2011  . FOOT SURGERY    . HALLUX VALGUS CORRECTION    . HIP SURGERY Right 1950   tendon release r/t polio  . KNEE ARTHROSCOPY Left 06/23/2008  . RETINAL DETACHMENT SURGERY Right 03/2014  . ROTATOR CUFF REPAIR Bilateral   . URINARY SURGERY  2014   [place]    Medical History: Past Medical History:  Diagnosis Date  . Anxiety   . Asthma 2015   mild, seasonal allergy triggered.  . Cancer ([place]) 2013   skin cancer  on left hand  . Depression   . Esophageal dysmotility   . GERD (gastroesophageal reflux disease) 11/05/2012  . History of hiatal hernia   . Hyperlipidemia   . Hypertension   . Incontinence of urine   . Lung nodule   . OA (osteoarthritis)   . Obesity   . Parkinson's disease ([place])   . PONV (postoperative nausea  and vomiting)   . Post-polio syndrome    contracted at 72 months old  . Pulmonary nodule 11/05/2012   RML  . Shingles 2009  . Sleep apnea     Family History: Family History  Problem Relation Age of Onset  . Heart disease Mother   . Heart disease Father   . Heart attack Sister   . Heart disease Brother   . Heart attack Brother   . Breast cancer Maternal Aunt   . Stroke Maternal Grandmother   . Heart disease Maternal Grandfather     Social History   Socioeconomic History  . Marital status: Married    Spouse name: Not on file  . Number of children: Not on file  . Years of education: Not on file  . Highest education level: Not on file  Occupational History  . Not on file  Tobacco Use  .  Smoking status: Former    Types: Cigarettes    Quit date: 05/09/1968    Years since quitting: 53.4  . Smokeless tobacco: Never  Vaping Use  . Vaping Use: Never used  Substance and Sexual Activity  . Alcohol use: No  . Drug use: No  . Sexual activity: Not on file  Other Topics Concern  . Not on file  Social History Narrative  . Not on file   Social Determinants of Health   Financial Resource Strain: Not on file  Food Insecurity: Not on file  Transportation Needs: Not on file  Physical Activity: Not on file  Stress: Not on file  Social Connections: Not on file  Intimate Partner Violence: Not on file      Review of Systems  Vital Signs: BP (!) 140/65 Comment: 150/80  Pulse 74   Temp 98.3 F (36.8 C)   Resp 16   Ht '5\' 3"'$  (1.6 m)   Wt 144 lb 9.6 oz (65.6 kg)   SpO2 97%   BMI 25.61 kg/m    Physical Exam     Assessment/Plan:   General Counseling: Sharetha verbalizes understanding of the findings of todays visit and agrees with plan of treatment. I have discussed any further diagnostic evaluation that may be needed or ordered today. We also reviewed her medications today. she has been encouraged to call the office with any questions or concerns that should arise  related to todays visit.    No orders of the defined types were placed in this encounter.   No orders of the defined types were placed in this encounter.   Return for previously scheduled, CPE, Shamyah Stantz PCP in february.   Total time spent:*** Minutes Time spent includes review of chart, medications, test results, and follow up plan with the patient.   [place] Controlled Substance Database was reviewed by me.  This patient was seen by Jonetta Osgood, FNP-C in collaboration with Dr. Clayborn Bigness as a part of collaborative care agreement.   Shondale Quinley R. Valetta Fuller, MSN, FNP-C Internal medicine

## 2021-09-26 ENCOUNTER — Encounter: Payer: Self-pay | Admitting: Nurse Practitioner

## 2021-09-27 ENCOUNTER — Ambulatory Visit: Payer: Medicare Other

## 2021-10-02 ENCOUNTER — Ambulatory Visit
Admission: RE | Admit: 2021-10-02 | Discharge: 2021-10-02 | Disposition: A | Discharge status: Home / Self Care | Payer: Medicare Other | Source: Ambulatory Visit | Attending: Orthopedic Surgery | Admitting: Orthopedic Surgery

## 2021-10-02 DIAGNOSIS — M40204 Unspecified kyphosis, thoracic region: Secondary | ICD-10-CM | POA: Diagnosis not present

## 2021-10-02 DIAGNOSIS — S22000A Wedge compression fracture of unspecified thoracic vertebra, initial encounter for closed fracture: Secondary | ICD-10-CM | POA: Diagnosis not present

## 2021-10-02 DIAGNOSIS — M546 Pain in thoracic spine: Secondary | ICD-10-CM | POA: Diagnosis not present

## 2021-10-03 ENCOUNTER — Ambulatory Visit: Payer: Medicare Other | Attending: Neurology

## 2021-10-03 DIAGNOSIS — R262 Difficulty in walking, not elsewhere classified: Secondary | ICD-10-CM

## 2021-10-03 DIAGNOSIS — M6281 Muscle weakness (generalized): Secondary | ICD-10-CM | POA: Diagnosis not present

## 2021-10-03 DIAGNOSIS — R2681 Unsteadiness on feet: Secondary | ICD-10-CM

## 2021-10-03 DIAGNOSIS — R278 Other lack of coordination: Secondary | ICD-10-CM | POA: Diagnosis not present
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
## 2021-10-03 NOTE — Addendum Note (Signed)
Addended by: Zollie Pee on: 10/03/2021 02:50 PM   Modules accepted: Orders

## 2021-10-04 ENCOUNTER — Ambulatory Visit: Payer: Medicare Other

## 2021-10-08 ENCOUNTER — Ambulatory Visit: Payer: Medicare Other

## 2021-10-08 DIAGNOSIS — M6281 Muscle weakness (generalized): Secondary | ICD-10-CM

## 2021-10-08 DIAGNOSIS — R278 Other lack of coordination: Secondary | ICD-10-CM

## 2021-10-08 DIAGNOSIS — R2681 Unsteadiness on feet: Secondary | ICD-10-CM

## 2021-10-08 DIAGNOSIS — R262 Difficulty in walking, not elsewhere classified: Secondary | ICD-10-CM | POA: Diagnosis not present
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
## 2021-10-10 ENCOUNTER — Ambulatory Visit: Payer: Medicare Other

## 2021-10-15 ENCOUNTER — Ambulatory Visit: Payer: Medicare Other

## 2021-10-15 DIAGNOSIS — M5414 Radiculopathy, thoracic region: Secondary | ICD-10-CM | POA: Diagnosis not present

## 2021-10-15 DIAGNOSIS — M47814 Spondylosis without myelopathy or radiculopathy, thoracic region: Secondary | ICD-10-CM | POA: Diagnosis not present

## 2021-10-15 DIAGNOSIS — M5134 Other intervertebral disc degeneration, thoracic region: Secondary | ICD-10-CM | POA: Diagnosis not present

## 2021-10-17 ENCOUNTER — Ambulatory Visit: Payer: Medicare Other

## 2021-10-22 ENCOUNTER — Ambulatory Visit: Payer: Medicare Other | Attending: Neurology

## 2021-10-22 DIAGNOSIS — R42 Dizziness and giddiness: Secondary | ICD-10-CM | POA: Diagnosis not present

## 2021-10-22 DIAGNOSIS — M6281 Muscle weakness (generalized): Secondary | ICD-10-CM | POA: Diagnosis not present

## 2021-10-22 DIAGNOSIS — R262 Difficulty in walking, not elsewhere classified: Secondary | ICD-10-CM | POA: Diagnosis not present

## 2021-10-22 DIAGNOSIS — R2681 Unsteadiness on feet: Secondary | ICD-10-CM | POA: Diagnosis not present

## 2021-10-22 DIAGNOSIS — R278 Other lack of coordination: Secondary | ICD-10-CM | POA: Insufficient documentation
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
## 2021-10-23 DIAGNOSIS — Z23 Encounter for immunization: Secondary | ICD-10-CM | POA: Diagnosis not present

## 2021-10-24 ENCOUNTER — Ambulatory Visit: Payer: Medicare Other

## 2021-10-24 DIAGNOSIS — R42 Dizziness and giddiness: Secondary | ICD-10-CM | POA: Diagnosis not present

## 2021-10-24 DIAGNOSIS — R262 Difficulty in walking, not elsewhere classified: Secondary | ICD-10-CM

## 2021-10-24 DIAGNOSIS — R278 Other lack of coordination: Secondary | ICD-10-CM | POA: Diagnosis not present

## 2021-10-24 DIAGNOSIS — M6281 Muscle weakness (generalized): Secondary | ICD-10-CM

## 2021-10-24 DIAGNOSIS — R2681 Unsteadiness on feet: Secondary | ICD-10-CM
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
## 2021-10-29 ENCOUNTER — Ambulatory Visit: Payer: Medicare Other

## 2021-10-31 ENCOUNTER — Ambulatory Visit: Payer: Medicare Other

## 2021-11-05 ENCOUNTER — Ambulatory Visit: Payer: Medicare Other

## 2021-11-05 DIAGNOSIS — R2681 Unsteadiness on feet: Secondary | ICD-10-CM | POA: Diagnosis not present

## 2021-11-05 DIAGNOSIS — R262 Difficulty in walking, not elsewhere classified: Secondary | ICD-10-CM

## 2021-11-05 DIAGNOSIS — R278 Other lack of coordination: Secondary | ICD-10-CM | POA: Diagnosis not present

## 2021-11-05 DIAGNOSIS — M6281 Muscle weakness (generalized): Secondary | ICD-10-CM | POA: Diagnosis not present

## 2021-11-05 DIAGNOSIS — R42 Dizziness and giddiness: Secondary | ICD-10-CM | POA: Diagnosis not present
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
## 2021-11-07 ENCOUNTER — Telehealth: Payer: Self-pay | Admitting: Nurse Practitioner

## 2021-11-07 ENCOUNTER — Ambulatory Visit: Payer: Medicare Other

## 2021-11-07 DIAGNOSIS — R2681 Unsteadiness on feet: Secondary | ICD-10-CM

## 2021-11-07 DIAGNOSIS — R278 Other lack of coordination: Secondary | ICD-10-CM

## 2021-11-07 DIAGNOSIS — R262 Difficulty in walking, not elsewhere classified: Secondary | ICD-10-CM | POA: Diagnosis not present

## 2021-11-07 DIAGNOSIS — M6281 Muscle weakness (generalized): Secondary | ICD-10-CM

## 2021-11-07 DIAGNOSIS — R42 Dizziness and giddiness: Secondary | ICD-10-CM | POA: Diagnosis not present
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
## 2021-11-07 NOTE — Telephone Encounter (Signed)
Received order for Immunodeficiency screeing from [employer]. Gave to Alyssa for signature-Toni

## 2021-11-12 ENCOUNTER — Ambulatory Visit: Payer: Medicare Other

## 2021-11-12 ENCOUNTER — Telehealth: Payer: Self-pay | Admitting: Internal Medicine

## 2021-11-12 DIAGNOSIS — R2681 Unsteadiness on feet: Secondary | ICD-10-CM | POA: Diagnosis not present

## 2021-11-12 DIAGNOSIS — R278 Other lack of coordination: Secondary | ICD-10-CM | POA: Diagnosis not present

## 2021-11-12 DIAGNOSIS — R42 Dizziness and giddiness: Secondary | ICD-10-CM | POA: Diagnosis not present

## 2021-11-12 DIAGNOSIS — M6281 Muscle weakness (generalized): Secondary | ICD-10-CM | POA: Diagnosis not present

## 2021-11-12 DIAGNOSIS — R262 Difficulty in walking, not elsewhere classified: Secondary | ICD-10-CM

## 2021-11-12 NOTE — Telephone Encounter (Signed)
order for Immunodeficiency screeing order signed. Faxed back to [employer]-[place]
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
## 2021-11-13 ENCOUNTER — Telehealth: Payer: Self-pay

## 2021-11-13 NOTE — Telephone Encounter (Addendum)
Pt husband called that her wife maybe talking telemarketing and accidentally send form for Immunodeficiency screening to dr Humphrey Rolls  advised him that fraud company disregard

## 2021-11-14 ENCOUNTER — Ambulatory Visit: Payer: Medicare Other

## 2021-11-15 DIAGNOSIS — M5414 Radiculopathy, thoracic region: Secondary | ICD-10-CM | POA: Diagnosis not present

## 2021-11-15 DIAGNOSIS — M4804 Spinal stenosis, thoracic region: Secondary | ICD-10-CM | POA: Diagnosis not present

## 2021-11-19 ENCOUNTER — Ambulatory Visit: Payer: Medicare Other

## 2021-11-21 ENCOUNTER — Ambulatory Visit: Payer: Medicare Other | Attending: Neurology

## 2021-11-21 DIAGNOSIS — M6281 Muscle weakness (generalized): Secondary | ICD-10-CM | POA: Insufficient documentation

## 2021-11-21 DIAGNOSIS — R2681 Unsteadiness on feet: Secondary | ICD-10-CM | POA: Insufficient documentation

## 2021-11-21 DIAGNOSIS — R262 Difficulty in walking, not elsewhere classified: Secondary | ICD-10-CM | POA: Insufficient documentation

## 2021-11-21 DIAGNOSIS — R278 Other lack of coordination: Secondary | ICD-10-CM | POA: Diagnosis not present
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
## 2021-11-28 ENCOUNTER — Ambulatory Visit: Payer: Medicare Other

## 2021-11-28 DIAGNOSIS — M6281 Muscle weakness (generalized): Secondary | ICD-10-CM

## 2021-11-28 DIAGNOSIS — R2681 Unsteadiness on feet: Secondary | ICD-10-CM

## 2021-11-28 DIAGNOSIS — R262 Difficulty in walking, not elsewhere classified: Secondary | ICD-10-CM

## 2021-11-28 DIAGNOSIS — R278 Other lack of coordination: Secondary | ICD-10-CM
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
## 2021-11-29 NOTE — Addendum Note (Signed)
Addended by: Etta Grandchild on: 11/29/2021 07:02 AM   Modules accepted: Orders

## 2021-11-30 ENCOUNTER — Ambulatory Visit: Payer: Medicare Other
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
## 2021-12-05 ENCOUNTER — Ambulatory Visit: Payer: Medicare Other

## 2021-12-05 DIAGNOSIS — R278 Other lack of coordination: Secondary | ICD-10-CM | POA: Diagnosis not present

## 2021-12-05 DIAGNOSIS — M6281 Muscle weakness (generalized): Secondary | ICD-10-CM | POA: Diagnosis not present

## 2021-12-05 DIAGNOSIS — R262 Difficulty in walking, not elsewhere classified: Secondary | ICD-10-CM

## 2021-12-05 DIAGNOSIS — R2681 Unsteadiness on feet: Secondary | ICD-10-CM

## 2021-12-07 ENCOUNTER — Encounter: Payer: Self-pay | Admitting: Physician Assistant

## 2021-12-07 ENCOUNTER — Ambulatory Visit (INDEPENDENT_AMBULATORY_CARE_PROVIDER_SITE_OTHER): Payer: Medicare Other | Admitting: Nurse Practitioner

## 2021-12-07 ENCOUNTER — Ambulatory Visit: Payer: Medicare Other

## 2021-12-07 VITALS — BP 138/60 | HR 70 | Temp 97.6°F | Resp 16 | Ht 63.0 in | Wt 148.0 lb

## 2021-12-07 DIAGNOSIS — F411 Generalized anxiety disorder: Secondary | ICD-10-CM

## 2021-12-07 DIAGNOSIS — I10 Essential (primary) hypertension: Secondary | ICD-10-CM | POA: Diagnosis not present

## 2021-12-07 DIAGNOSIS — N3946 Mixed incontinence: Secondary | ICD-10-CM

## 2021-12-07 DIAGNOSIS — M26609 Unspecified temporomandibular joint disorder, unspecified side: Secondary | ICD-10-CM

## 2021-12-07 DIAGNOSIS — E782 Mixed hyperlipidemia: Secondary | ICD-10-CM

## 2021-12-07 DIAGNOSIS — Z760 Encounter for issue of repeat prescription: Secondary | ICD-10-CM | POA: Diagnosis not present

## 2021-12-07 DIAGNOSIS — E876 Hypokalemia: Secondary | ICD-10-CM

## 2021-12-07 DIAGNOSIS — K219 Gastro-esophageal reflux disease without esophagitis: Secondary | ICD-10-CM

## 2021-12-07 MED ORDER — AMLODIPINE BESYLATE 2.5 MG PO TABS: ORAL_TABLET | ORAL | 2 refills | Status: DC

## 2021-12-07 MED ORDER — LOSARTAN POTASSIUM 100 MG PO TABS: 100.0000 mg | ORAL_TABLET | Freq: Every day | ORAL | 1 refills | Status: DC

## 2021-12-07 MED ORDER — ESCITALOPRAM OXALATE 20 MG PO TABS: 20.0000 mg | ORAL_TABLET | Freq: Every day | ORAL | 1 refills | Status: DC

## 2021-12-07 MED ORDER — OXYBUTYNIN CHLORIDE ER 10 MG PO TB24: 20.0000 mg | ORAL_TABLET | Freq: Every day | ORAL | 1 refills | Status: DC

## 2021-12-07 MED ORDER — HYDROCODONE-ACETAMINOPHEN 5-325 MG PO TABS: 1.0000 | ORAL_TABLET | ORAL | 0 refills | Status: DC | PRN

## 2021-12-07 MED ORDER — METOPROLOL TARTRATE 25 MG PO TABS: 25.0000 mg | ORAL_TABLET | Freq: Two times a day (BID) | ORAL | 1 refills | Status: DC

## 2021-12-07 MED ORDER — PANTOPRAZOLE SODIUM 40 MG PO TBEC: 40.0000 mg | DELAYED_RELEASE_TABLET | Freq: Two times a day (BID) | ORAL | 1 refills | Status: DC

## 2021-12-07 MED ORDER — POTASSIUM CHLORIDE CRYS ER 20 MEQ PO TBCR: 20.0000 meq | EXTENDED_RELEASE_TABLET | Freq: Every day | ORAL | 1 refills | Status: DC

## 2021-12-07 MED ORDER — SIMVASTATIN 20 MG PO TABS: ORAL_TABLET | ORAL | 1 refills | Status: DC

## 2021-12-07 MED ORDER — BUPROPION HCL ER (XL) 150 MG PO TB24: 150.0000 mg | ORAL_TABLET | Freq: Every day | ORAL | 1 refills | Status: DC

## 2021-12-07 NOTE — Progress Notes (Unsigned)
[facility] [place], [place] [address]  Internal MEDICINE  Office Visit Note  Patient Name: Susan Shah  [id number]  [id number]  Date of Service: 12/07/2021  Chief Complaint  Patient presents with   Follow-up   Depression   Gastroesophageal Reflux   Hypertension   Hyperlipidemia   Medication Refill   Quality Metric Gaps    Pneumonia Vaccine    HPI Susan Shah presents for a follow-up visit for   Refills Husband having surgery on Tuesday  Appt Monday dentist  Next Monday ortho      Current Medication: Outpatient Encounter Medications as of 12/07/2021  Medication Sig   acetaminophen (TYLENOL) 500 MG tablet Take 1,000 mg by mouth every 6 (six) hours as needed for moderate pain or headache.   amLODipine (NORVASC) 2.5 MG tablet TAKE 1 TABLET BY MOUTH EVERY DAY - STOP LISINOPRIL   buPROPion (WELLBUTRIN XL) 150 MG 24 hr tablet Take 1 tablet (150 mg total) by mouth daily.   carbidopa-levodopa (SINEMET) 25-100 MG tablet Take one tab po qpm   Carboxymethylcellul-Glycerin (LUBRICATING EYE DROPS OP) Place 1 drop into both eyes daily as needed (dry eyes).   cholecalciferol (VITAMIN D3) 25 MCG (1000 UNIT) tablet Take 1,000 Units by mouth daily.   escitalopram (LEXAPRO) 20 MG tablet Take 1 tablet (20 mg total) by mouth daily.   famotidine (PEPCID) 20 MG tablet TAKE 1 TABLET (20 MG TOTAL) BY MOUTH 2 (TWO) TIMES DAILY AS NEEDED FOR HEARTBURN OR INDIGESTION.   fluticasone (FLONASE) 50 MCG/ACT nasal spray Place 1 spray into both nostrils at bedtime as needed for allergies or rhinitis.   HYDROcodone-acetaminophen (NORCO) 5-325 MG tablet Take 1 tablet by mouth every 4 (four) hours as needed for moderate pain or severe pain. No more than 6 tablets per 24 hours   HYDROcodone-acetaminophen (NORCO) 5-325 MG tablet Take 1 tablet by mouth every 4 (four) hours as needed for moderate pain or severe pain. No more than 6 tablets per 24 hours   loratadine (QC LORATADINE ALLERGY  RELIEF) 10 MG tablet Take 1 tablet (10 mg total) by mouth daily.   losartan (COZAAR) 100 MG tablet Take 1 tablet (100 mg total) by mouth daily.   metoprolol tartrate (LOPRESSOR) 25 MG tablet Take 1 tablet (25 mg total) by mouth 2 (two) times daily with a meal.   ondansetron (ZOFRAN-ODT) 4 MG disintegrating tablet Take 1 tablet (4 mg total) by mouth every 8 (eight) hours as needed for nausea or vomiting.   oxybutynin (DITROPAN-XL) 10 MG 24 hr tablet Take 2 tablets (20 mg total) by mouth daily.   pantoprazole (PROTONIX) 40 MG tablet Take 1 tablet (40 mg total) by mouth 2 (two) times daily.   potassium chloride SA (KLOR-CON M) 20 MEQ tablet Take 1 tablet (20 mEq total) by mouth daily.   rOPINIRole (REQUIP) 0.25 MG tablet Take 1 tablet (0.25 mg total) by mouth 3 (three) times daily.   simvastatin (ZOCOR) 20 MG tablet TAKE 1 TABLET BY MOUTH DAILY AT 6 PM.   vitamin B-12 (CYANOCOBALAMIN) 1000 MCG tablet Take 1,000 mcg by mouth daily.   No facility-administered encounter medications on file as of 12/07/2021.    Surgical History: Past Surgical History:  Procedure Laterality Date   47 HOUR [place] STUDY N/A 04/24/2016   Procedure: 24 HOUR PH STUDY;  Surgeon: Lucilla Lame, MD;  Location: [facility] ENDOSCOPY;  Service: Endoscopy;  Laterality: N/A;   ABDOMINAL HYSTERECTOMY     Total   APPENDECTOMY  CARPOMETACARPAL ([place]) FUSION OF THUMB Left 05/16/2016   Procedure: CARPOMETACARPAL [facility]) FUSION OF THUMB;  Surgeon: Hessie Knows, MD;  Location: [facility] ORS;  Service: Orthopedics;  Laterality: Left;   CARPOMETACARPAL ([place]) FUSION OF THUMB Left 03/30/2019   Procedure: LEFT THUMB SUSPENSION PLASTY;  Surgeon: Hessie Knows, MD;  Location: [facility] ORS;  Service: Orthopedics;  Laterality: Left;   CATARACT EXTRACTION Bilateral 12/2012   CHOLECYSTECTOMY     COLONOSCOPY  2003   ESOPHAGEAL MANOMETRY N/A 04/24/2016   Procedure: ESOPHAGEAL MANOMETRY (EM);  Surgeon: Lucilla Lame, MD;  Location: [facility] ENDOSCOPY;  Service: Endoscopy;   Laterality: N/A;   ESOPHAGOGASTRODUODENOSCOPY  08/2011   FOOT SURGERY     HALLUX VALGUS CORRECTION     HIP SURGERY Right 1950   tendon release r/t polio   KNEE ARTHROSCOPY Left 06/23/2008   RETINAL DETACHMENT SURGERY Right 03/2014   ROTATOR CUFF REPAIR Bilateral    URINARY SURGERY  2014   [place]    Medical History: Past Medical History:  Diagnosis Date   Anxiety    Asthma 2015   mild, seasonal allergy triggered.   Cancer ([place]) 2013   skin cancer  on left hand   Depression    Esophageal dysmotility    GERD (gastroesophageal reflux disease) 11/05/2012   History of hiatal hernia    Hyperlipidemia    Hypertension    Incontinence of urine    Lung nodule    OA (osteoarthritis)    Obesity    Parkinson's disease    PONV (postoperative nausea and vomiting)    Post-polio syndrome    contracted at 46 months old   Pulmonary nodule 11/05/2012   RML   Shingles 2009   Sleep apnea     Family History: Family History  Problem Relation Age of Onset   Heart disease Mother    Heart disease Father    Heart attack Sister    Heart disease Brother    Heart attack Brother    Breast cancer Maternal Aunt    Stroke Maternal Grandmother    Heart disease Maternal Grandfather     Social History   Socioeconomic History   Marital status: Married    Spouse name: Not on file   Number of children: Not on file   Years of education: Not on file   Highest education level: Not on file  Occupational History   Not on file  Tobacco Use   Smoking status: Former    Types: Cigarettes    Quit date: 05/09/1968    Years since quitting: 53.6   Smokeless tobacco: Never  Vaping Use   Vaping Use: Never used  Substance and Sexual Activity   Alcohol use: No   Drug use: No   Sexual activity: Not on file  Other Topics Concern   Not on file  Social History Narrative   Not on file   Social Determinants of Health   Financial Resource Strain: Not on file  Food Insecurity: Not on file   Transportation Needs: Not on file  Physical Activity: Not on file  Stress: Not on file  Social Connections: Not on file  Intimate Partner Violence: Not on file      Review of Systems  Vital Signs: BP (!) 167/60   Pulse 70   Temp 97.6 F (36.4 C)   Resp 16   Ht '5\' 3"'$  (1.6 m)   Wt 148 lb (67.1 kg)   SpO2 98%   BMI 26.22 kg/m    Physical Exam  Assessment/Plan:   General Counseling: Natasia verbalizes understanding of the findings of todays visit and agrees with plan of treatment. I have discussed any further diagnostic evaluation that may be needed or ordered today. We also reviewed her medications today. she has been encouraged to call the office with any questions or concerns that should arise related to todays visit.    No orders of the defined types were placed in this encounter.   No orders of the defined types were placed in this encounter.   No follow-ups on file.   Total time spent:*** Minutes Time spent includes review of chart, medications, test results, and follow up plan with the patient.   [place] Controlled Substance Database was reviewed by me.  This patient was seen by Jonetta Osgood, FNP-C in collaboration with Dr. Clayborn Bigness as a part of collaborative care agreement.   Melessa Cowell R. Valetta Fuller, MSN, FNP-C Internal medicine

## 2021-12-08 MED ORDER — HYDROCODONE-ACETAMINOPHEN 5-325 MG PO TABS: 1.0000 | ORAL_TABLET | ORAL | 0 refills | Status: DC | PRN

## 2021-12-10 ENCOUNTER — Telehealth: Payer: Self-pay

## 2021-12-10 ENCOUNTER — Ambulatory Visit: Payer: Medicare Other

## 2021-12-12 ENCOUNTER — Ambulatory Visit: Payer: Medicare Other

## 2021-12-12 DIAGNOSIS — R278 Other lack of coordination: Secondary | ICD-10-CM | POA: Diagnosis not present

## 2021-12-12 DIAGNOSIS — M6281 Muscle weakness (generalized): Secondary | ICD-10-CM | POA: Diagnosis not present

## 2021-12-12 DIAGNOSIS — R262 Difficulty in walking, not elsewhere classified: Secondary | ICD-10-CM

## 2021-12-12 DIAGNOSIS — R2681 Unsteadiness on feet: Secondary | ICD-10-CM
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
## 2021-12-18 ENCOUNTER — Other Ambulatory Visit: Payer: Self-pay | Admitting: Nurse Practitioner

## 2021-12-18 DIAGNOSIS — M47814 Spondylosis without myelopathy or radiculopathy, thoracic region: Secondary | ICD-10-CM | POA: Diagnosis not present

## 2021-12-18 DIAGNOSIS — M5134 Other intervertebral disc degeneration, thoracic region: Secondary | ICD-10-CM | POA: Diagnosis not present

## 2021-12-18 MED ORDER — HYDROCODONE-ACETAMINOPHEN 10-325 MG PO TABS: 0.5000 | ORAL_TABLET | Freq: Three times a day (TID) | ORAL | 0 refills | Status: DC | PRN

## 2021-12-18 NOTE — Telephone Encounter (Signed)
done

## 2021-12-19 ENCOUNTER — Ambulatory Visit: Payer: Medicare Other

## 2021-12-20 ENCOUNTER — Encounter: Payer: Self-pay | Admitting: Physical Therapy

## 2021-12-20 ENCOUNTER — Ambulatory Visit: Payer: Medicare Other | Admitting: Physical Therapy

## 2021-12-20 DIAGNOSIS — R2681 Unsteadiness on feet: Secondary | ICD-10-CM | POA: Diagnosis not present

## 2021-12-20 DIAGNOSIS — M6281 Muscle weakness (generalized): Secondary | ICD-10-CM

## 2021-12-20 DIAGNOSIS — R262 Difficulty in walking, not elsewhere classified: Secondary | ICD-10-CM

## 2021-12-20 DIAGNOSIS — R278 Other lack of coordination: Secondary | ICD-10-CM | POA: Diagnosis not present
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
## 2021-12-21 ENCOUNTER — Ambulatory Visit: Payer: Medicare Other

## 2021-12-26 ENCOUNTER — Ambulatory Visit: Payer: Medicare Other | Attending: Neurology

## 2021-12-26 DIAGNOSIS — R2681 Unsteadiness on feet: Secondary | ICD-10-CM | POA: Diagnosis not present

## 2021-12-26 DIAGNOSIS — M6281 Muscle weakness (generalized): Secondary | ICD-10-CM | POA: Insufficient documentation

## 2021-12-26 DIAGNOSIS — R278 Other lack of coordination: Secondary | ICD-10-CM | POA: Diagnosis not present

## 2021-12-26 DIAGNOSIS — R262 Difficulty in walking, not elsewhere classified: Secondary | ICD-10-CM | POA: Insufficient documentation

## 2021-12-26 IMAGING — MG MM DIGITAL SCREENING BILAT W/ TOMO AND CAD
8 series · 8 of 24 positions shown · non-contrast
Comparison: Previous exam(s).

CLINICAL DATA: Screening.

EXAM:
DIGITAL SCREENING BILATERAL MAMMOGRAM WITH TOMOSYNTHESIS AND CAD
TECHNIQUE: Bilateral screening digital craniocaudal and mediolateral oblique
mammograms were obtained. Bilateral screening digital breast
tomosynthesis was performed. The images were evaluated with
computer-aided detection.

[R CC synth-2D]
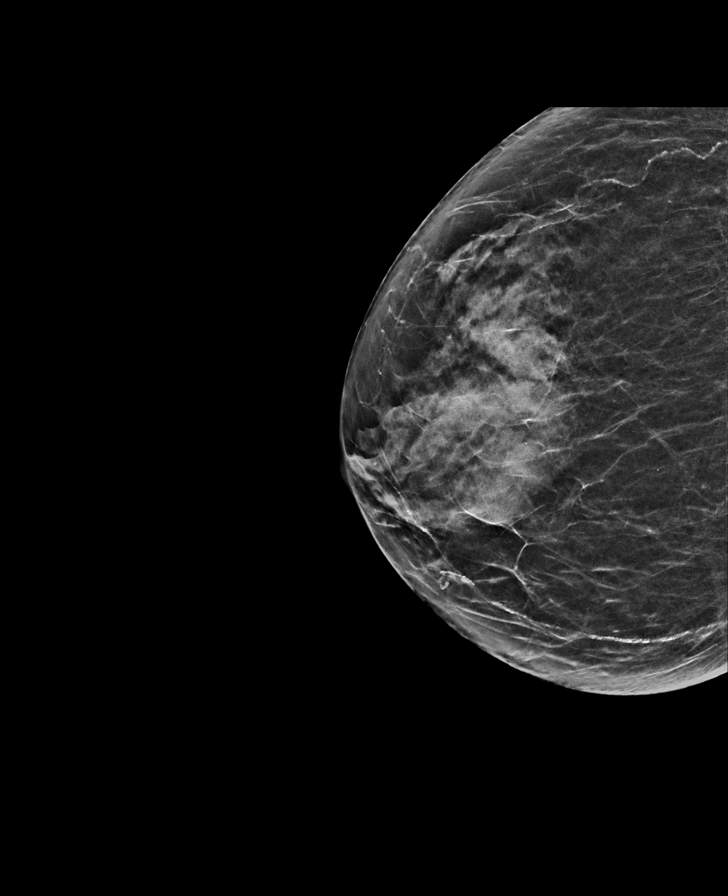

[L CC synth-2D]
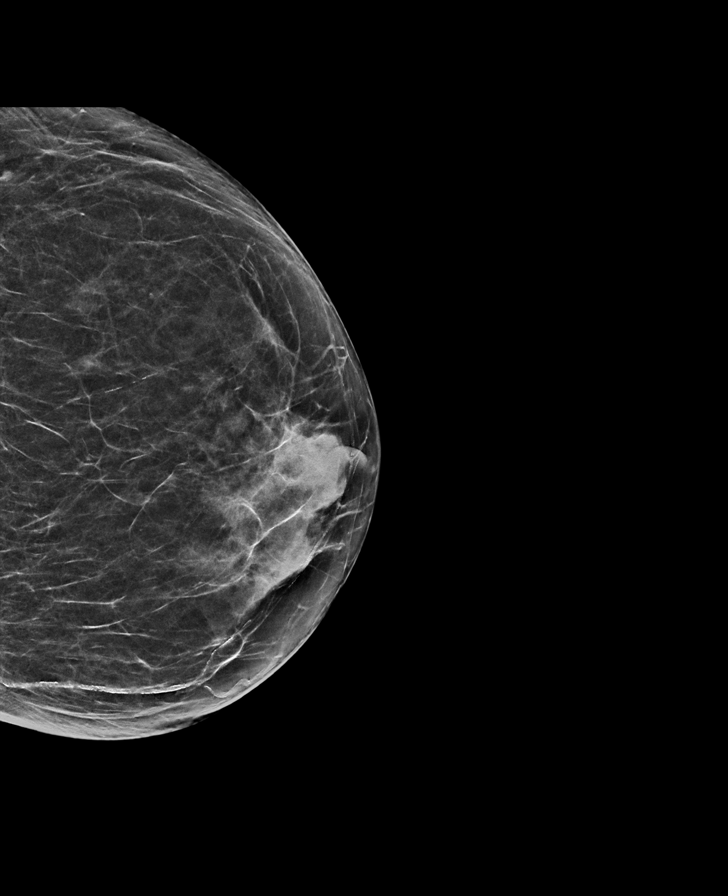

[L MLO synth-2D]
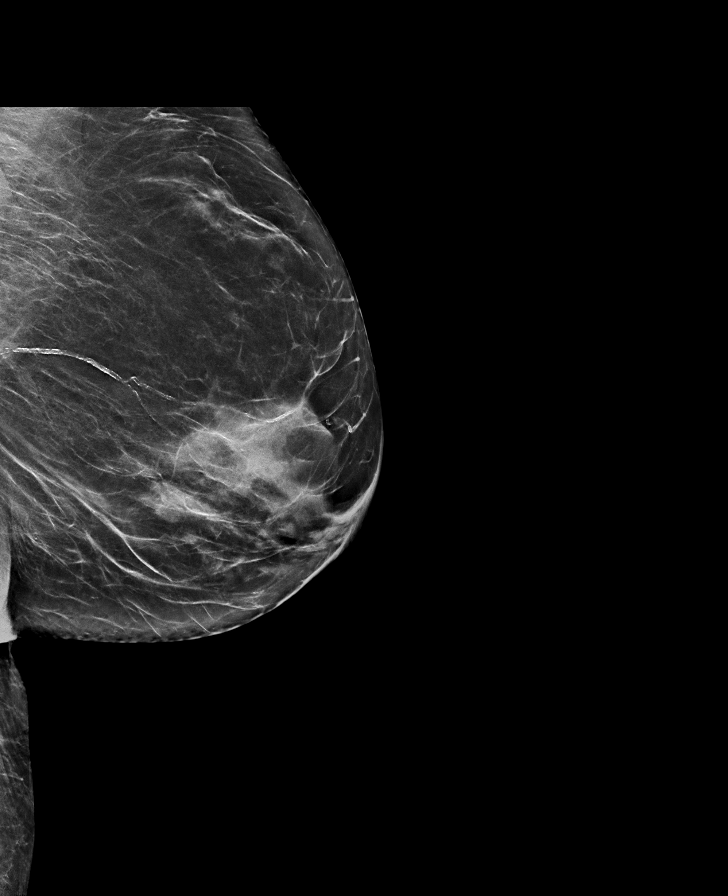

[R MLO synth-2D]
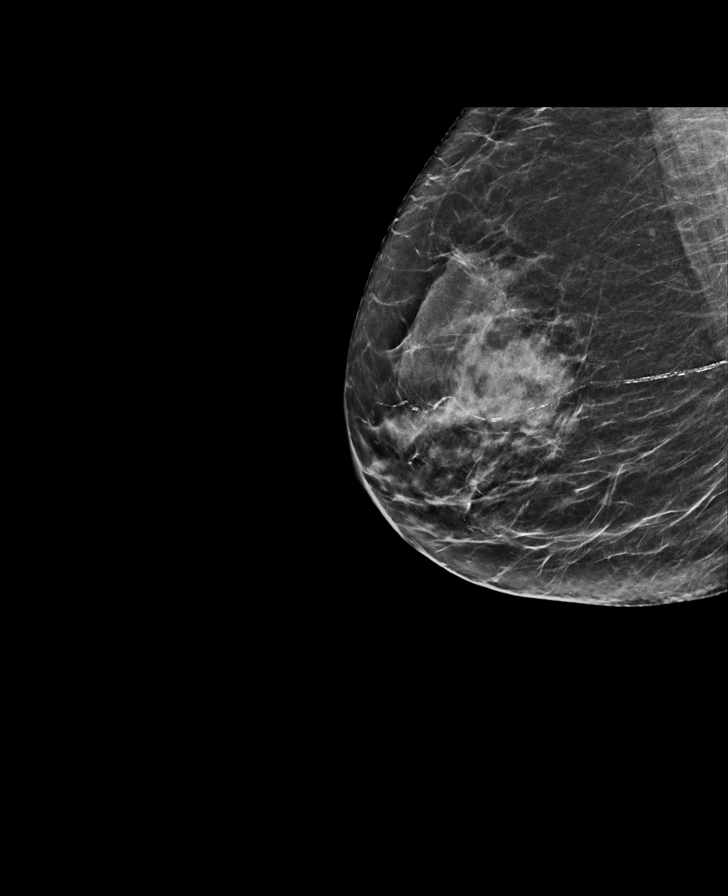

[L MLO tomo · tomo slice 34/67.0]
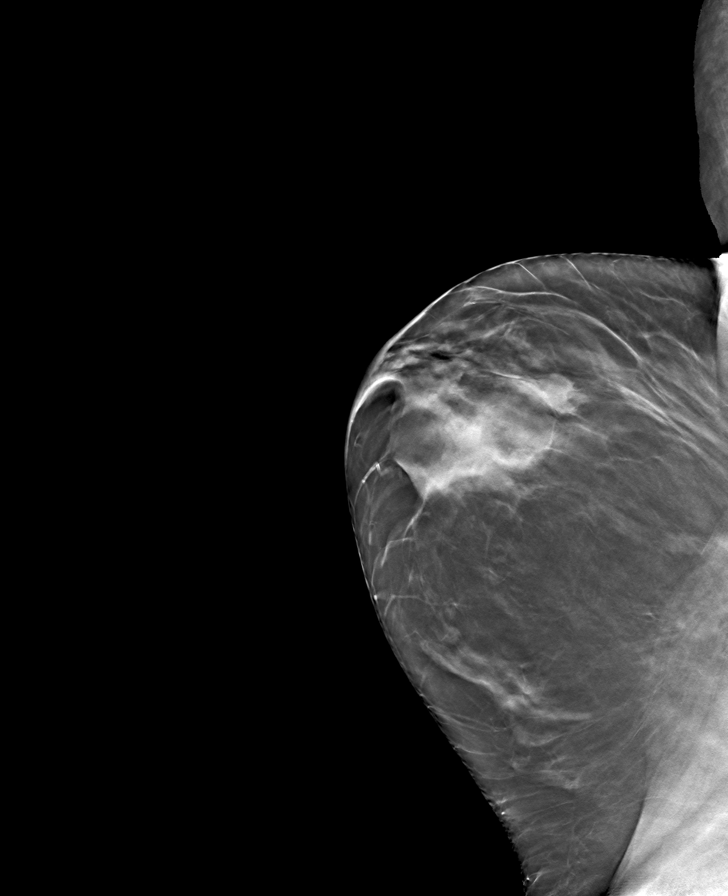

[R CC tomo · tomo slice 29/57.0]
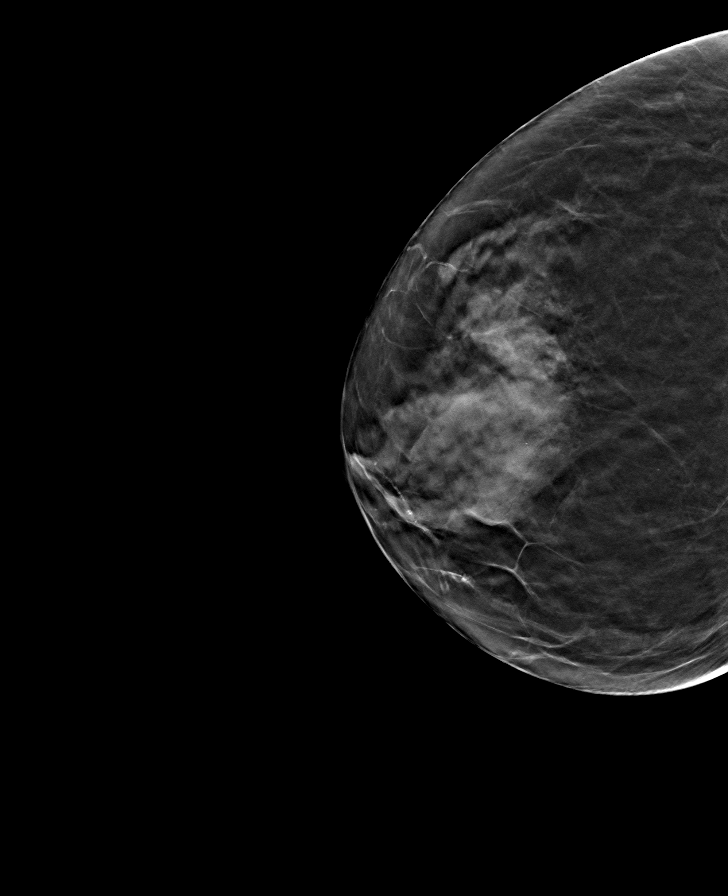

[L CC tomo · tomo slice 29/58.0]
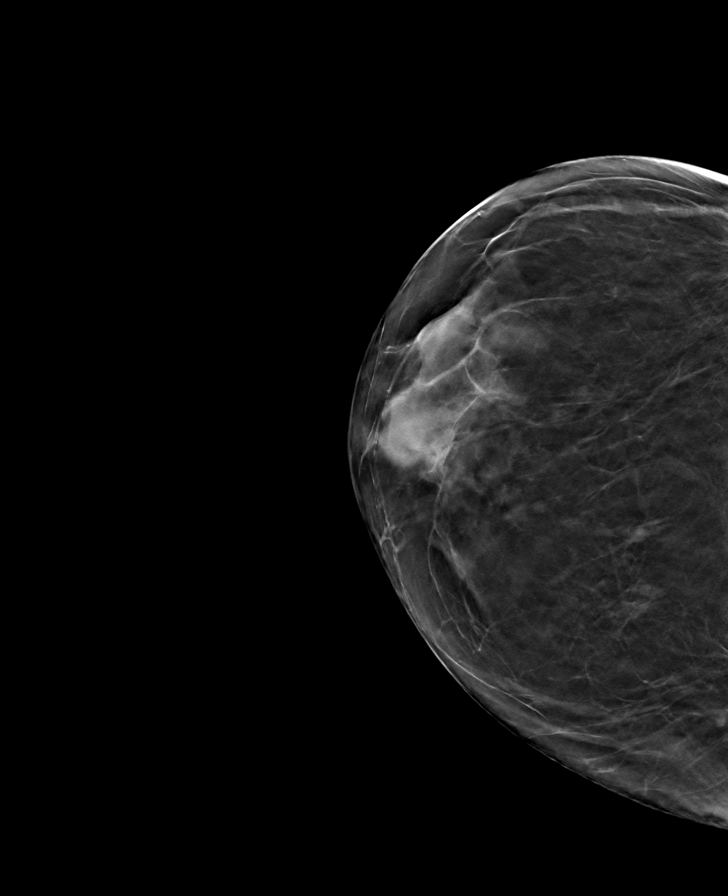

[R MLO tomo · tomo slice 33/64.0]
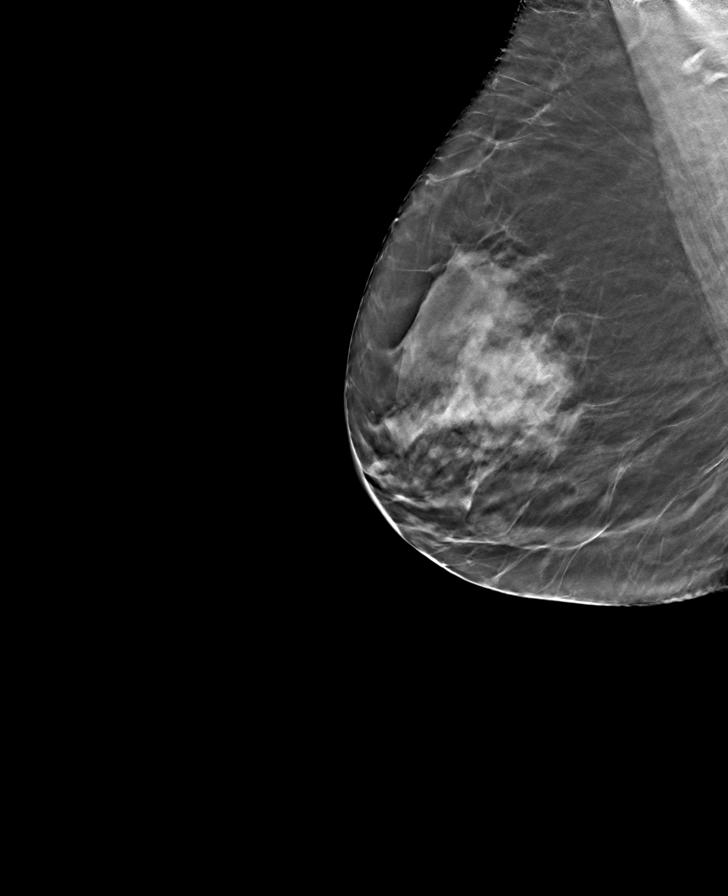

[8 of 24 positions shown; findings below may reference images not displayed]

ACR Breast Density Category c: The breast tissue is heterogeneously
dense, which may obscure small masses.
FINDINGS: There are no findings suspicious for malignancy. The images were
evaluated with computer-aided detection.
IMPRESSION: No mammographic evidence of malignancy. A result letter of this
screening mammogram will be mailed directly to the patient.

RECOMMENDATION:
Screening mammogram in one year. (Code:T4-5-GWO)

BI-RADS CATEGORY  1: Negative.
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
## 2021-12-28 ENCOUNTER — Ambulatory Visit: Payer: Medicare Other

## 2022-01-02 ENCOUNTER — Ambulatory Visit: Payer: Medicare Other

## 2022-01-04 ENCOUNTER — Ambulatory Visit: Payer: Medicare Other

## 2022-01-09 ENCOUNTER — Ambulatory Visit: Payer: Medicare Other

## 2022-01-11 ENCOUNTER — Ambulatory Visit: Payer: Medicare Other

## 2022-01-16 ENCOUNTER — Ambulatory Visit: Payer: Medicare Other

## 2022-01-18 ENCOUNTER — Ambulatory Visit: Payer: Medicare Other

## 2022-01-23 ENCOUNTER — Ambulatory Visit: Payer: Medicare Other

## 2022-01-25 ENCOUNTER — Ambulatory Visit: Payer: Medicare Other

## 2022-01-28 ENCOUNTER — Ambulatory Visit: Payer: Medicare Other

## 2022-01-29 DIAGNOSIS — G14 Postpolio syndrome: Secondary | ICD-10-CM | POA: Diagnosis not present

## 2022-01-29 DIAGNOSIS — G20B1 Parkinson's disease with dyskinesia, without mention of fluctuations: Secondary | ICD-10-CM | POA: Diagnosis not present

## 2022-01-29 DIAGNOSIS — G4733 Obstructive sleep apnea (adult) (pediatric): Secondary | ICD-10-CM | POA: Diagnosis not present

## 2022-01-29 DIAGNOSIS — G8311 Monoplegia of lower limb affecting right dominant side: Secondary | ICD-10-CM | POA: Diagnosis not present

## 2022-01-30 ENCOUNTER — Ambulatory Visit: Payer: Medicare Other

## 2022-02-01 ENCOUNTER — Ambulatory Visit: Payer: Medicare Other

## 2022-02-04 ENCOUNTER — Ambulatory Visit: Payer: Medicare Other

## 2022-02-06 ENCOUNTER — Ambulatory Visit: Payer: Medicare Other | Admitting: Nurse Practitioner

## 2022-02-06 ENCOUNTER — Ambulatory Visit: Payer: Medicare Other

## 2022-02-08 ENCOUNTER — Ambulatory Visit: Payer: Medicare Other

## 2022-02-11 ENCOUNTER — Ambulatory Visit: Payer: Medicare Other

## 2022-02-12 DIAGNOSIS — K080 Exfoliation of teeth due to systemic causes: Secondary | ICD-10-CM | POA: Diagnosis not present

## 2022-02-13 ENCOUNTER — Ambulatory Visit: Payer: Medicare Other

## 2022-02-15 ENCOUNTER — Ambulatory Visit: Payer: Medicare Other

## 2022-02-18 ENCOUNTER — Ambulatory Visit: Payer: Medicare Other

## 2022-02-19 DIAGNOSIS — M4804 Spinal stenosis, thoracic region: Secondary | ICD-10-CM | POA: Diagnosis not present

## 2022-02-19 DIAGNOSIS — M5414 Radiculopathy, thoracic region: Secondary | ICD-10-CM | POA: Diagnosis not present

## 2022-02-20 ENCOUNTER — Ambulatory Visit: Payer: Medicare Other

## 2022-02-21 ENCOUNTER — Encounter: Payer: Self-pay | Admitting: Nurse Practitioner

## 2022-02-21 ENCOUNTER — Ambulatory Visit (INDEPENDENT_AMBULATORY_CARE_PROVIDER_SITE_OTHER): Payer: Medicare Other | Admitting: Nurse Practitioner

## 2022-02-21 VITALS — BP 140/75 | HR 67 | Temp 97.5°F | Resp 16 | Ht 63.0 in | Wt 146.6 lb

## 2022-02-21 DIAGNOSIS — E559 Vitamin D deficiency, unspecified: Secondary | ICD-10-CM

## 2022-02-21 DIAGNOSIS — Z1231 Encounter for screening mammogram for malignant neoplasm of breast: Secondary | ICD-10-CM

## 2022-02-21 DIAGNOSIS — E782 Mixed hyperlipidemia: Secondary | ICD-10-CM

## 2022-02-21 DIAGNOSIS — G20B2 Parkinson's disease with dyskinesia, with fluctuations: Secondary | ICD-10-CM

## 2022-02-21 DIAGNOSIS — G14 Postpolio syndrome: Secondary | ICD-10-CM

## 2022-02-21 DIAGNOSIS — I10 Essential (primary) hypertension: Secondary | ICD-10-CM

## 2022-02-21 DIAGNOSIS — Z1212 Encounter for screening for malignant neoplasm of rectum: Secondary | ICD-10-CM

## 2022-02-21 DIAGNOSIS — G20A1 Parkinson's disease without dyskinesia, without mention of fluctuations: Secondary | ICD-10-CM

## 2022-02-21 DIAGNOSIS — Z0001 Encounter for general adult medical examination with abnormal findings: Secondary | ICD-10-CM | POA: Diagnosis not present

## 2022-02-21 DIAGNOSIS — Z1211 Encounter for screening for malignant neoplasm of colon: Secondary | ICD-10-CM

## 2022-02-21 MED ORDER — HYDROCODONE-ACETAMINOPHEN 10-325 MG PO TABS: 0.5000 | ORAL_TABLET | Freq: Three times a day (TID) | ORAL | 0 refills | Status: DC | PRN

## 2022-02-21 NOTE — Progress Notes (Signed)
[facility] [place], [place] [address]  Internal MEDICINE  Office Visit Note  Patient Name: Susan Shah  [id number]  [id number]  Date of Service: 02/21/2022  Chief Complaint  Patient presents with   Medicare Wellness   Depression   Gastroesophageal Reflux   Hyperlipidemia   Hypertension    HPI Susan Shah presents for an annual well visit and physical exam.  Well-appearing 77 y.o. female with hypertension, OSA, hiatal hernia, GERD, post-polio syndrome, parkinsons disease, and depression Routine CRC screening: due for cologuard test Routine mammogram: done in April 2023 DEXA scan: done in 2017 Labs: due for routine labs  New or worsening pain: chronic pain, manageable. Other concerns: none Intermittent nausea some times after taking medications.  Feeling depressed but it is manageable, related to her chronic conditions with parkinsons and polio.      02/21/2022   11:15 AM 02/20/2021   11:11 AM 01/07/2020   11:16 AM  MMSE - Mini Mental State Exam  Orientation to time '5 5 5  '$ Orientation to Place '5 5 5  '$ Registration '3 3 3  '$ Attention/ Calculation '5 5 5  '$ Recall '3 3 3  '$ Language- name 2 objects '2 2 2  '$ Language- repeat '1 1 1  '$ Language- follow 3 step command '3 3 3  '$ Language- read & follow direction '1 1 1  '$ Write a sentence 1 1 0  Copy design '1 1 1  '$ Total score '30 30 29    '$ Functional Status Survey: Is the patient deaf or have difficulty hearing?: Yes Does the patient have difficulty seeing, even when wearing glasses/contacts?: No Does the patient have difficulty concentrating, remembering, or making decisions?: No Does the patient have difficulty walking or climbing stairs?: Yes Does the patient have difficulty dressing or bathing?: Yes Does the patient have difficulty doing errands alone such as visiting a doctor's office or shopping?: No     02/20/2021   11:09 AM 06/26/2021    3:44 PM 09/25/2021    3:34 PM 12/07/2021   11:17 AM 02/21/2022   11:11 AM   Fall Risk  Falls in the past year? '1 1 1 1 1  '$ Was there an injury with Fall? 0 0 0 0 0  Fall Risk Category Calculator '2 2 2 1 1  '$ Fall Risk Category (Retired) Moderate Moderate Moderate Low   (RETIRED) Patient Fall Risk Level Moderate fall risk Moderate fall risk High fall risk    Patient at Risk for Falls Due to Impaired balance/gait;Impaired mobility Impaired balance/gait;Impaired mobility     Fall risk Follow up Falls evaluation completed Falls evaluation completed   Falls evaluation completed       02/21/2022   11:11 AM  Depression screen PHQ 2/9  Decreased Interest 3  Down, Depressed, Hopeless 3  PHQ - 2 Score 6  Altered sleeping 0  Tired, decreased energy 3  Change in appetite 0  Feeling bad or failure about yourself  2  Trouble concentrating 3  Moving slowly or fidgety/restless 0  Suicidal thoughts 0  PHQ-9 Score 14  Difficult doing work/chores Somewhat difficult      Current Medication: Outpatient Encounter Medications as of 02/21/2022  Medication Sig   acetaminophen (TYLENOL) 500 MG tablet Take 1,000 mg by mouth every 6 (six) hours as needed for moderate pain or headache.   amLODipine (NORVASC) 2.5 MG tablet TAKE 1 TABLET BY MOUTH EVERY DAY - STOP LISINOPRIL   buPROPion (WELLBUTRIN XL) 150 MG 24 hr tablet Take 1 tablet (  150 mg total) by mouth daily.   Carbidopa-Levodopa ER (SINEMET CR) 25-100 MG tablet controlled release Take 1 tablet by mouth 3 (three) times daily.   Carboxymethylcellul-Glycerin (LUBRICATING EYE DROPS OP) Place 1 drop into both eyes daily as needed (dry eyes).   cholecalciferol (VITAMIN D3) 25 MCG (1000 UNIT) tablet Take 1,000 Units by mouth daily.   escitalopram (LEXAPRO) 20 MG tablet Take 1 tablet (20 mg total) by mouth daily.   famotidine (PEPCID) 20 MG tablet TAKE 1 TABLET (20 MG TOTAL) BY MOUTH 2 (TWO) TIMES DAILY AS NEEDED FOR HEARTBURN OR INDIGESTION.   fluticasone (FLONASE) 50 MCG/ACT nasal spray Place 1 spray into both nostrils at bedtime as  needed for allergies or rhinitis.   loratadine (QC LORATADINE ALLERGY RELIEF) 10 MG tablet Take 1 tablet (10 mg total) by mouth daily.   losartan (COZAAR) 100 MG tablet Take 1 tablet (100 mg total) by mouth daily.   metoprolol tartrate (LOPRESSOR) 25 MG tablet Take 1 tablet (25 mg total) by mouth 2 (two) times daily with a meal.   ondansetron (ZOFRAN-ODT) 4 MG disintegrating tablet Take 1 tablet (4 mg total) by mouth every 8 (eight) hours as needed for nausea or vomiting.   oxybutynin (DITROPAN-XL) 10 MG 24 hr tablet Take 2 tablets (20 mg total) by mouth daily.   pantoprazole (PROTONIX) 40 MG tablet Take 1 tablet (40 mg total) by mouth 2 (two) times daily.   potassium chloride SA (KLOR-CON M) 20 MEQ tablet Take 1 tablet (20 mEq total) by mouth daily.   rOPINIRole (REQUIP) 0.25 MG tablet Take by mouth.   simvastatin (ZOCOR) 20 MG tablet TAKE 1 TABLET BY MOUTH DAILY AT 6 PM.   vitamin B-12 (CYANOCOBALAMIN) 1000 MCG tablet Take 1,000 mcg by mouth daily.   [DISCONTINUED] carbidopa-levodopa (SINEMET) 25-100 MG tablet Take one tab po qpm   [DISCONTINUED] HYDROcodone-acetaminophen (NORCO) 10-325 MG tablet Take 0.5 tablets by mouth every 8 (eight) hours as needed for moderate pain or severe pain.   [DISCONTINUED] HYDROcodone-acetaminophen (NORCO) 5-325 MG tablet Take 1 tablet by mouth every 4 (four) hours as needed for moderate pain or severe pain. No more than 6 tablets per 24 hours   [DISCONTINUED] rOPINIRole (REQUIP) 0.25 MG tablet Take 1 tablet (0.25 mg total) by mouth 3 (three) times daily.   HYDROcodone-acetaminophen (NORCO) 10-325 MG tablet Take 0.5 tablets by mouth every 8 (eight) hours as needed for moderate pain or severe pain.   No facility-administered encounter medications on file as of 02/21/2022.    Surgical History: Past Surgical History:  Procedure Laterality Date   81 HOUR [place] STUDY N/A 04/24/2016   Procedure: 24 HOUR PH STUDY;  Surgeon: Susan Lame, MD;  Location: [facility] ENDOSCOPY;   Service: Endoscopy;  Laterality: N/A;   ABDOMINAL HYSTERECTOMY     Total   APPENDECTOMY     CARPOMETACARPAL ([facility]) FUSION OF THUMB Left 05/16/2016   Procedure: CARPOMETACARPAL [facility]) FUSION OF THUMB;  Surgeon: Hessie Knows, MD;  Location: [facility] ORS;  Service: Orthopedics;  Laterality: Left;   CARPOMETACARPAL ([place]) FUSION OF THUMB Left 03/30/2019   Procedure: LEFT THUMB SUSPENSION PLASTY;  Surgeon: Hessie Knows, MD;  Location: [facility] ORS;  Service: Orthopedics;  Laterality: Left;   CATARACT EXTRACTION Bilateral 12/2012   CHOLECYSTECTOMY     COLONOSCOPY  2003   ESOPHAGEAL MANOMETRY N/A 04/24/2016   Procedure: ESOPHAGEAL MANOMETRY (EM);  Surgeon: Susan Lame, MD;  Location: [facility] ENDOSCOPY;  Service: Endoscopy;  Laterality: N/A;   ESOPHAGOGASTRODUODENOSCOPY  08/2011  FOOT SURGERY     HALLUX VALGUS CORRECTION     HIP SURGERY Right 1950   tendon release r/t polio   KNEE ARTHROSCOPY Left 06/23/2008   RETINAL DETACHMENT SURGERY Right 03/2014   ROTATOR CUFF REPAIR Bilateral    URINARY SURGERY  2014   [place]    Medical History: Past Medical History:  Diagnosis Date   Anxiety    Asthma 2015   mild, seasonal allergy triggered.   Cancer ([place]) 2013   skin cancer  on left hand   Depression    Esophageal dysmotility    GERD (gastroesophageal reflux disease) 11/05/2012   History of hiatal hernia    Hyperlipidemia    Hypertension    Incontinence of urine    Lung nodule    OA (osteoarthritis)    Obesity    Parkinson's disease    PONV (postoperative nausea and vomiting)    Post-polio syndrome    contracted at 49 months old   Pulmonary nodule 11/05/2012   RML   Shingles 2009   Sleep apnea     Family History: Family History  Problem Relation Age of Onset   Heart disease Mother    Heart disease Father    Heart attack Sister    Heart disease Brother    Heart attack Brother    Breast cancer Maternal Aunt    Stroke Maternal Grandmother    Heart disease Maternal Grandfather      Social History   Socioeconomic History   Marital status: Married    Spouse name: Not on file   Number of children: Not on file   Years of education: Not on file   Highest education level: Not on file  Occupational History   Not on file  Tobacco Use   Smoking status: Former    Types: Cigarettes    Quit date: 05/09/1968    Years since quitting: 53.8   Smokeless tobacco: Never  Vaping Use   Vaping Use: Never used  Substance and Sexual Activity   Alcohol use: No   Drug use: No   Sexual activity: Not on file  Other Topics Concern   Not on file  Social History Narrative   Not on file   Social Determinants of Health   Financial Resource Strain: Not on file  Food Insecurity: Not on file  Transportation Needs: Not on file  Physical Activity: Not on file  Stress: Not on file  Social Connections: Not on file  Intimate Partner Violence: Not on file      Review of Systems  Constitutional:  Positive for fatigue. Negative for activity change, appetite change, chills, fever and unexpected weight change.  HENT: Negative.  Negative for congestion, ear pain, rhinorrhea, sore throat and trouble swallowing.   Eyes: Negative.   Respiratory: Negative.  Negative for cough, chest tightness, shortness of breath and wheezing.   Cardiovascular: Negative.  Negative for chest pain and palpitations.  Gastrointestinal:  Positive for nausea. Negative for abdominal pain, blood in stool, constipation, diarrhea and vomiting.  Endocrine: Negative.   Genitourinary: Negative.  Negative for difficulty urinating, dysuria, frequency, hematuria and urgency.  Musculoskeletal:  Positive for arthralgias and gait problem. Negative for back pain, joint swelling, myalgias and neck pain.  Skin: Negative.  Negative for rash and wound.  Allergic/Immunologic: Negative.  Negative for immunocompromised state.  Neurological:  Positive for weakness. Negative for dizziness, seizures, numbness and headaches.   Hematological: Negative.   Psychiatric/Behavioral:  Positive for behavioral problems. Negative for self-injury,  sleep disturbance and suicidal ideas. The patient is nervous/anxious.     Vital Signs: BP (!) 140/75 Comment: 171/75  Pulse 67   Temp (!) 97.5 F (36.4 C)   Resp 16   Ht '5\' 3"'$  (1.6 m)   Wt 146 lb 9.6 oz (66.5 kg)   SpO2 98%   BMI 25.97 kg/m    Physical Exam Vitals reviewed.  Constitutional:      General: She is awake. She is not in acute distress.    Appearance: Normal appearance. She is well-developed, well-groomed and normal weight. She is not ill-appearing or diaphoretic.  HENT:     Head: Normocephalic and atraumatic.     Right Ear: Tympanic membrane, ear canal and external ear normal.     Left Ear: Tympanic membrane, ear canal and external ear normal.     Nose: Nose normal. No congestion or rhinorrhea.     Mouth/Throat:     Lips: Pink.     Mouth: Mucous membranes are moist.     Pharynx: Oropharynx is clear. Uvula midline. No oropharyngeal exudate or posterior oropharyngeal erythema.  Eyes:     General: Lids are normal. Vision grossly intact. Gaze aligned appropriately. No scleral icterus.       Right eye: No discharge.        Left eye: No discharge.     Extraocular Movements: Extraocular movements intact.     Conjunctiva/sclera: Conjunctivae normal.     Pupils: Pupils are equal, round, and reactive to light.     Funduscopic exam:    Right eye: Red reflex present.        Left eye: Red reflex present. Neck:     Thyroid: No thyromegaly.     Vascular: No JVD.     Trachea: Trachea and phonation normal. No tracheal deviation.  Cardiovascular:     Rate and Rhythm: Normal rate and regular rhythm.     Pulses: Normal pulses.     Heart sounds: Normal heart sounds, S1 normal and S2 normal. No murmur heard.    No friction rub. No gallop.  Pulmonary:     Effort: Pulmonary effort is normal. No accessory muscle usage or respiratory distress.     Breath sounds:  Normal breath sounds and air entry. No stridor. No wheezing or rales.  Chest:     Chest wall: No tenderness.  Breasts:    Right: Normal. No swelling, bleeding, inverted nipple, mass, nipple discharge, skin change or tenderness.     Left: Normal. No swelling, bleeding, inverted nipple, mass, nipple discharge, skin change or tenderness.  Abdominal:     General: Bowel sounds are normal. There is no distension.     Palpations: Abdomen is soft. There is no mass.     Tenderness: There is no abdominal tenderness. There is no guarding or rebound.  Musculoskeletal:        General: No tenderness or deformity. Normal range of motion.     Cervical back: Normal range of motion and neck supple.     Right lower leg: No edema.     Left lower leg: No edema.  Lymphadenopathy:     Cervical: No cervical adenopathy.     Upper Body:     Right upper body: No supraclavicular, axillary or pectoral adenopathy.     Left upper body: No supraclavicular, axillary or pectoral adenopathy.  Skin:    General: Skin is warm and dry.     Capillary Refill: Capillary refill takes less than 2 seconds.  Coloration: Skin is not pale.     Findings: No erythema or rash.  Neurological:     Mental Status: She is alert and oriented to person, place, and time.     Cranial Nerves: No cranial nerve deficit.     Motor: No abnormal muscle tone.     Coordination: Coordination normal.     Gait: Gait normal.     Deep Tendon Reflexes: Reflexes are normal and symmetric.  Psychiatric:        Mood and Affect: Mood normal.        Behavior: Behavior normal. Behavior is cooperative.        Thought Content: Thought content normal.        Judgment: Judgment normal.        Assessment/Plan: 1. Encounter for routine adult health examination with abnormal findings Age-appropriate preventive screenings and vaccinations discussed, annual physical exam completed. Routine labs for health maintenance ordered, see below. PHM updated.  - CBC  with Differential/Platelet - CMP14+EGFR - Lipid Profile - TSH + free T4 - Vitamin D (25 hydroxy)  2. Parkinson's disease with dyskinesia and fluctuating manifestations Medication refills ordered, continue as prescribed. Routine labs ordered - Carbidopa-Levodopa ER (SINEMET CR) 25-100 MG tablet controlled release; Take 1 tablet by mouth 3 (three) times daily. - rOPINIRole (REQUIP) 0.25 MG tablet; Take by mouth. - CBC with Differential/Platelet - CMP14+EGFR - Lipid Profile - TSH + free T4 - Vitamin D (25 hydroxy) - HYDROcodone-acetaminophen (NORCO) 10-325 MG tablet; Take 0.5 tablets by mouth every 8 (eight) hours as needed for moderate pain or severe pain.  Dispense: 30 tablet; Refill: 0  3. Post-polio syndrome Pain med refill ordred - HYDROcodone-acetaminophen (NORCO) 10-325 MG tablet; Take 0.5 tablets by mouth every 8 (eight) hours as needed for moderate pain or severe pain.  Dispense: 30 tablet; Refill: 0  4. Vitamin D deficiency Routine lab ordered - Vitamin D (25 hydroxy)  5. Mixed hyperlipidemia Routine lab ordered - Lipid Profile  6. Encounter for screening mammogram for malignant neoplasm of breast Routine mammogram ordered - MM 3D SCREEN BREAST BILATERAL; Future  7. Screening for colorectal cancer Cologuard test ordered - Cologuard      General Counseling: Tyaisha verbalizes understanding of the findings of todays visit and agrees with plan of treatment. I have discussed any further diagnostic evaluation that may be needed or ordered today. We also reviewed her medications today. she has been encouraged to call the office with any questions or concerns that should arise related to todays visit.    Orders Placed This Encounter  Procedures   MM 3D SCREEN BREAST BILATERAL   Cologuard   CBC with Differential/Platelet   CMP14+EGFR   Lipid Profile   TSH + free T4   Vitamin D (25 hydroxy)    Meds ordered this encounter  Medications   HYDROcodone-acetaminophen  (NORCO) 10-325 MG tablet    Sig: Take 0.5 tablets by mouth every 8 (eight) hours as needed for moderate pain or severe pain.    Dispense:  30 tablet    Refill:  0    Change in strength since 5-325 mg was not available.    Return in about 3 months (around 05/22/2022) for F/U, pain med refill, Jasin Brazel PCP.   Total time spent:30 Minutes Time spent includes review of chart, medications, test results, and follow up plan with the patient.   [place] Controlled Substance Database was reviewed by me.  This patient was seen by Jonetta Osgood, FNP-C in collaboration with  Dr. Clayborn Bigness as a part of collaborative care agreement.  Lisette Mancebo R. Valetta Fuller, MSN, FNP-C Internal medicine ,

## 2022-02-22 ENCOUNTER — Encounter: Payer: Self-pay | Admitting: Nurse Practitioner

## 2022-02-25 ENCOUNTER — Ambulatory Visit: Payer: Medicare Other

## 2022-02-25 DIAGNOSIS — K080 Exfoliation of teeth due to systemic causes: Secondary | ICD-10-CM | POA: Diagnosis not present

## 2022-02-27 ENCOUNTER — Ambulatory Visit: Payer: Medicare Other

## 2022-03-20 DIAGNOSIS — K080 Exfoliation of teeth due to systemic causes: Secondary | ICD-10-CM | POA: Diagnosis not present

## 2022-03-30 ENCOUNTER — Other Ambulatory Visit: Payer: Self-pay | Admitting: Nurse Practitioner

## 2022-03-30 DIAGNOSIS — Z760 Encounter for issue of repeat prescription: Secondary | ICD-10-CM

## 2022-04-16 DIAGNOSIS — E559 Vitamin D deficiency, unspecified: Secondary | ICD-10-CM | POA: Diagnosis not present

## 2022-04-16 DIAGNOSIS — I10 Essential (primary) hypertension: Secondary | ICD-10-CM | POA: Diagnosis not present

## 2022-04-16 DIAGNOSIS — G20A1 Parkinson's disease without dyskinesia, without mention of fluctuations: Secondary | ICD-10-CM | POA: Diagnosis not present

## 2022-04-16 DIAGNOSIS — Z0001 Encounter for general adult medical examination with abnormal findings: Secondary | ICD-10-CM | POA: Diagnosis not present

## 2022-04-16 DIAGNOSIS — E782 Mixed hyperlipidemia: Secondary | ICD-10-CM | POA: Diagnosis not present

## 2022-04-18 LAB — CBC WITH DIFFERENTIAL/PLATELET
Basophils Absolute: 0 10*3/uL (ref 0.0–0.2)
Basos: 1 %
EOS (ABSOLUTE): 0.2 10*3/uL (ref 0.0–0.4)
Eos: 4 %
Hematocrit: 35.6 % (ref 34.0–46.6)
Hemoglobin: 11.2 g/dL (ref 11.1–15.9)
Immature Grans (Abs): 0 10*3/uL (ref 0.0–0.1)
Immature Granulocytes: 0 %
Lymphocytes Absolute: 1.9 10*3/uL (ref 0.7–3.1)
Lymphs: 36 %
MCH: 27.7 pg (ref 26.6–33.0)
MCHC: 31.5 g/dL (ref 31.5–35.7)
MCV: 88 fL (ref 79–97)
Monocytes Absolute: 0.4 10*3/uL (ref 0.1–0.9)
Monocytes: 8 %
Neutrophils Absolute: 2.8 10*3/uL (ref 1.4–7.0)
Neutrophils: 51 %
Platelets: 310 10*3/uL (ref 150–450)
RBC: 4.04 x10E6/uL (ref 3.77–5.28)
RDW: 13.1 % (ref 11.7–15.4)
WBC: 5.5 10*3/uL (ref 3.4–10.8)

## 2022-04-18 LAB — CMP14+EGFR
ALT: 13 IU/L (ref 0–32)
AST: 17 IU/L (ref 0–40)
Albumin/Globulin Ratio: 1.7 (ref 1.2–2.2)
Albumin: 3.9 g/dL (ref 3.8–4.8)
Alkaline Phosphatase: 101 IU/L (ref 44–121)
BUN/Creatinine Ratio: 34 — ABNORMAL HIGH (ref 12–28)
BUN: 25 mg/dL (ref 8–27)
Bilirubin Total: 0.2 mg/dL (ref 0.0–1.2)
CO2: 25 mmol/L (ref 20–29)
Calcium: 9.3 mg/dL (ref 8.7–10.3)
Chloride: 105 mmol/L (ref 96–106)
Creatinine, Ser: 0.74 mg/dL (ref 0.57–1.00)
Globulin, Total: 2.3 g/dL (ref 1.5–4.5)
Glucose: 93 mg/dL (ref 70–99)
Potassium: 4.3 mmol/L (ref 3.5–5.2)
Sodium: 143 mmol/L (ref 134–144)
Total Protein: 6.2 g/dL (ref 6.0–8.5)
eGFR: 84 mL/min/{1.73_m2} (ref >59)

## 2022-04-18 LAB — LIPID PANEL
Chol/HDL Ratio: 2.5 ratio (ref 0.0–4.4)
Cholesterol, Total: 150 mg/dL (ref 100–199)
HDL: 61 mg/dL (ref >39)
LDL Chol Calc (NIH): 71 mg/dL (ref 0–99)
Triglycerides: 100 mg/dL (ref 0–149)
VLDL Cholesterol Cal: 18 mg/dL (ref 5–40)

## 2022-04-18 LAB — TSH+FREE T4
Free T4: 1.25 ng/dL (ref 0.82–1.77)
TSH: 2.23 u[IU]/mL (ref 0.450–4.500)

## 2022-04-18 LAB — VITAMIN D 25 HYDROXY (VIT D DEFICIENCY, FRACTURES): Vit D, 25-Hydroxy: 29.2 ng/mL — ABNORMAL LOW (ref 30.0–100.0)

## 2022-04-30 DIAGNOSIS — G20B1 Parkinson's disease with dyskinesia, without mention of fluctuations: Secondary | ICD-10-CM | POA: Diagnosis not present

## 2022-04-30 DIAGNOSIS — G8311 Monoplegia of lower limb affecting right dominant side: Secondary | ICD-10-CM | POA: Diagnosis not present

## 2022-04-30 DIAGNOSIS — G14 Postpolio syndrome: Secondary | ICD-10-CM | POA: Diagnosis not present

## 2022-04-30 DIAGNOSIS — R441 Visual hallucinations: Secondary | ICD-10-CM | POA: Diagnosis not present

## 2022-05-05 DIAGNOSIS — Z1212 Encounter for screening for malignant neoplasm of rectum: Secondary | ICD-10-CM | POA: Diagnosis not present

## 2022-05-05 DIAGNOSIS — Z1211 Encounter for screening for malignant neoplasm of colon: Secondary | ICD-10-CM | POA: Diagnosis not present

## 2022-05-14 LAB — COLOGUARD: COLOGUARD: NEGATIVE

## 2022-05-17 ENCOUNTER — Ambulatory Visit
Admission: RE | Admit: 2022-05-17 | Discharge: 2022-05-17 | Disposition: A | Discharge status: Home / Self Care | Payer: Medicare Other | Source: Ambulatory Visit | Attending: Nurse Practitioner | Admitting: Nurse Practitioner

## 2022-05-17 DIAGNOSIS — Z1231 Encounter for screening mammogram for malignant neoplasm of breast: Secondary | ICD-10-CM

## 2022-05-23 ENCOUNTER — Encounter: Payer: Self-pay | Admitting: Nurse Practitioner

## 2022-05-23 ENCOUNTER — Ambulatory Visit (INDEPENDENT_AMBULATORY_CARE_PROVIDER_SITE_OTHER): Payer: Medicare Other | Admitting: Nurse Practitioner

## 2022-05-23 VITALS — BP 140/76 | HR 76 | Temp 97.8°F | Resp 16 | Ht 63.0 in | Wt 146.0 lb

## 2022-05-23 DIAGNOSIS — G20B2 Parkinson's disease with dyskinesia, with fluctuations: Secondary | ICD-10-CM | POA: Diagnosis not present

## 2022-05-23 DIAGNOSIS — R1030 Lower abdominal pain, unspecified: Secondary | ICD-10-CM

## 2022-05-23 DIAGNOSIS — G14 Postpolio syndrome: Secondary | ICD-10-CM

## 2022-05-23 MED ORDER — HYDROCODONE-ACETAMINOPHEN 10-325 MG PO TABS: 0.5000 | ORAL_TABLET | Freq: Three times a day (TID) | ORAL | 0 refills | Status: DC | PRN

## 2022-05-23 NOTE — Progress Notes (Signed)
[facility] [address] [place], [place] [address]  Internal MEDICINE  Office Visit Note  Patient Name: Susan Shah  [id number]  [id number]  Date of Service: 05/23/2022  Chief Complaint  Patient presents with   Depression   Gastroesophageal Reflux   Hypertension   Hyperlipidemia   Follow-up    HPI Susan Shah presents for a follow-up visit for back pain, parkinsons disease and back pain.  Lower abdominal pain -- mild to moderate pain, was going to try to give a urine sample to check for UTI but was unable to give a sample at this time.  Chronic back pain -- takes pain medication, due for refills today Parkinsons disease -- sees neurology, on ropinirole and carbidopa-levodopa     Current Medication: Outpatient Encounter Medications as of 05/23/2022  Medication Sig   acetaminophen (TYLENOL) 500 MG tablet Take 1,000 mg by mouth every 6 (six) hours as needed for moderate pain or headache.   amLODipine (NORVASC) 2.5 MG tablet TAKE 1 TABLET BY MOUTH EVERY DAY - STOP LISINOPRIL   buPROPion (WELLBUTRIN XL) 150 MG 24 hr tablet Take 1 tablet (150 mg total) by mouth daily.   Carbidopa-Levodopa ER (SINEMET CR) 25-100 MG tablet controlled release Take 1 tablet by mouth 3 (three) times daily.   Carboxymethylcellul-Glycerin (LUBRICATING EYE DROPS OP) Place 1 drop into both eyes daily as needed (dry eyes).   cholecalciferol (VITAMIN D3) 25 MCG (1000 UNIT) tablet Take 1,000 Units by mouth daily.   famotidine (PEPCID) 20 MG tablet TAKE 1 TABLET (20 MG TOTAL) BY MOUTH 2 (TWO) TIMES DAILY AS NEEDED FOR HEARTBURN OR INDIGESTION.   fluticasone (FLONASE) 50 MCG/ACT nasal spray Place 1 spray into both nostrils at bedtime as needed for allergies or rhinitis.   loratadine (QC LORATADINE ALLERGY RELIEF) 10 MG tablet Take 1 tablet (10 mg total) by mouth daily.   ondansetron (ZOFRAN-ODT) 4 MG disintegrating tablet Take 1 tablet (4 mg total) by mouth every 8 (eight) hours as needed for nausea or  vomiting.   oxybutynin (DITROPAN-XL) 10 MG 24 hr tablet TAKE 2 TABLETS BY MOUTH EVERY DAY   pantoprazole (PROTONIX) 40 MG tablet TAKE 1 TABLET BY MOUTH TWICE A DAY   potassium chloride SA (KLOR-CON M) 20 MEQ tablet Take 1 tablet (20 mEq total) by mouth daily.   rOPINIRole (REQUIP) 0.25 MG tablet Take by mouth.   vitamin B-12 (CYANOCOBALAMIN) 1000 MCG tablet Take 1,000 mcg by mouth daily.   [DISCONTINUED] escitalopram (LEXAPRO) 20 MG tablet Take 1 tablet (20 mg total) by mouth daily.   [DISCONTINUED] HYDROcodone-acetaminophen (NORCO) 10-325 MG tablet Take 0.5 tablets by mouth every 8 (eight) hours as needed for moderate pain or severe pain.   [DISCONTINUED] losartan (COZAAR) 100 MG tablet Take 1 tablet (100 mg total) by mouth daily.   [DISCONTINUED] metoprolol tartrate (LOPRESSOR) 25 MG tablet Take 1 tablet (25 mg total) by mouth 2 (two) times daily with a meal.   [DISCONTINUED] simvastatin (ZOCOR) 20 MG tablet TAKE 1 TABLET BY MOUTH DAILY AT 6 PM.   HYDROcodone-acetaminophen (NORCO) 10-325 MG tablet Take 0.5 tablets by mouth every 8 (eight) hours as needed for moderate pain or severe pain.   [START ON 06/20/2022] HYDROcodone-acetaminophen (NORCO) 10-325 MG tablet Take 0.5 tablets by mouth every 8 (eight) hours as needed for moderate pain or severe pain.   [START ON 07/18/2022] HYDROcodone-acetaminophen (NORCO) 10-325 MG tablet Take 0.5 tablets by mouth every 8 (eight) hours as needed for moderate pain or severe pain.  No facility-administered encounter medications on file as of 05/23/2022.    Surgical History: Past Surgical History:  Procedure Laterality Date   7 HOUR PH STUDY N/A 04/24/2016   Procedure: 24 HOUR PH STUDY;  Surgeon: Midge Minium, MD;  Location: [facility] ENDOSCOPY;  Service: Endoscopy;  Laterality: N/A;   ABDOMINAL HYSTERECTOMY     Total   APPENDECTOMY     CARPOMETACARPAL (CMC) FUSION OF THUMB Left 05/16/2016   Procedure: CARPOMETACARPAL [facility]) FUSION OF THUMB;  Surgeon: Kennedy Bucker,  MD;  Location: [facility] ORS;  Service: Orthopedics;  Laterality: Left;   CARPOMETACARPAL (CMC) FUSION OF THUMB Left 03/30/2019   Procedure: LEFT THUMB SUSPENSION PLASTY;  Surgeon: Kennedy Bucker, MD;  Location: [facility] ORS;  Service: Orthopedics;  Laterality: Left;   CATARACT EXTRACTION Bilateral 12/2012   CHOLECYSTECTOMY     COLONOSCOPY  2003   ESOPHAGEAL MANOMETRY N/A 04/24/2016   Procedure: ESOPHAGEAL MANOMETRY (EM);  Surgeon: Midge Minium, MD;  Location: [facility] ENDOSCOPY;  Service: Endoscopy;  Laterality: N/A;   ESOPHAGOGASTRODUODENOSCOPY  08/2011   FOOT SURGERY     HALLUX VALGUS CORRECTION     HIP SURGERY Right 1950   tendon release r/t polio   KNEE ARTHROSCOPY Left 06/23/2008   RETINAL DETACHMENT SURGERY Right 03/2014   ROTATOR CUFF REPAIR Bilateral    URINARY SURGERY  2014   [place]    Medical History: Past Medical History:  Diagnosis Date   Anxiety    Asthma 2015   mild, seasonal allergy triggered.   Cancer (HCC) 2013   skin cancer  on left hand   Depression    Esophageal dysmotility    GERD (gastroesophageal reflux disease) 11/05/2012   History of hiatal hernia    Hyperlipidemia    Hypertension    Incontinence of urine    Lung nodule    OA (osteoarthritis)    Obesity    Parkinson's disease    PONV (postoperative nausea and vomiting)    Post-polio syndrome    contracted at 73 months old   Pulmonary nodule 11/05/2012   RML   Shingles 2009   Sleep apnea     Family History: Family History  Problem Relation Age of Onset   Heart disease Mother    Heart disease Father    Heart attack Sister    Heart disease Brother    Heart attack Brother    Breast cancer Maternal Aunt    Stroke Maternal Grandmother    Heart disease Maternal Grandfather     Social History   Socioeconomic History   Marital status: Married    Spouse name: Not on file   Number of children: Not on file   Years of education: Not on file   Highest education level: Not on file  Occupational History    Not on file  Tobacco Use   Smoking status: Former    Types: Cigarettes    Quit date: 05/09/1968    Years since quitting: 54.1   Smokeless tobacco: Never  Vaping Use   Vaping Use: Never used  Substance and Sexual Activity   Alcohol use: No   Drug use: No   Sexual activity: Not on file  Other Topics Concern   Not on file  Social History Narrative   Not on file   Social Determinants of Health   Financial Resource Strain: Not on file  Food Insecurity: Not on file  Transportation Needs: Not on file  Physical Activity: Not on file  Stress: Not on file  Social Connections:  Not on file  Intimate Partner Violence: Not on file      Review of Systems  Constitutional:  Negative for appetite change, chills, fatigue and unexpected weight change.  HENT:  Negative for congestion, rhinorrhea, sneezing and sore throat.   Eyes:  Negative for redness.  Respiratory: Negative.  Negative for cough, chest tightness, shortness of breath and wheezing.   Cardiovascular: Negative.  Negative for chest pain and palpitations.  Gastrointestinal:  Negative for abdominal pain, constipation, diarrhea and nausea.  Genitourinary:  Negative for dysuria and frequency.  Musculoskeletal:  Negative for arthralgias, back pain, joint swelling and neck pain.  Skin:  Negative for rash.  Neurological: Negative.  Negative for tremors and numbness.  Hematological:  Negative for adenopathy. Does not bruise/bleed easily.  Psychiatric/Behavioral:  Negative for behavioral problems (Depression), sleep disturbance and suicidal ideas. The patient is not nervous/anxious.     Vital Signs: BP (!) 140/76   Pulse 76   Temp 97.8 F (36.6 C)   Resp 16   Ht 5\' 3"  (1.6 m)   Wt 146 lb (66.2 kg)   SpO2 96%   BMI 25.86 kg/m    Physical Exam Vitals reviewed.  Constitutional:      General: She is not in acute distress.    Appearance: Normal appearance. She is normal weight. She is not ill-appearing.  HENT:     Head:  Normocephalic and atraumatic.  Eyes:     Pupils: Pupils are equal, round, and reactive to light.  Cardiovascular:     Rate and Rhythm: Normal rate and regular rhythm.  Pulmonary:     Effort: Pulmonary effort is normal. No respiratory distress.  Abdominal:     General: Bowel sounds are normal. There is no distension.     Palpations: Abdomen is soft.     Tenderness: There is no abdominal tenderness. There is no guarding.     Hernia: No hernia is present.  Neurological:     Mental Status: She is alert and oriented to person, place, and time.     Cranial Nerves: No cranial nerve deficit.     Gait: Gait normal.  Psychiatric:        Mood and Affect: Mood normal.        Behavior: Behavior normal.        Assessment/Plan: 1. Lower abdominal pain Not bothering her that much, will call the clinic if it does not get better or gets any worse.   2. Post-polio syndrome Continue prn hydrocodone as prescribed.  - HYDROcodone-acetaminophen (NORCO) 10-325 MG tablet; Take 0.5 tablets by mouth every 8 (eight) hours as needed for moderate pain or severe pain.  Dispense: 30 tablet; Refill: 0 - HYDROcodone-acetaminophen (NORCO) 10-325 MG tablet; Take 0.5 tablets by mouth every 8 (eight) hours as needed for moderate pain or severe pain.  Dispense: 30 tablet; Refill: 0 - HYDROcodone-acetaminophen (NORCO) 10-325 MG tablet; Take 0.5 tablets by mouth every 8 (eight) hours as needed for moderate pain or severe pain.  Dispense: 30 tablet; Refill: 0  3. Parkinson's disease with dyskinesia and fluctuating manifestations Refills of hydrocodone ordered. Sees neurology for parkinsons - HYDROcodone-acetaminophen (NORCO) 10-325 MG tablet; Take 0.5 tablets by mouth every 8 (eight) hours as needed for moderate pain or severe pain.  Dispense: 30 tablet; Refill: 0 - HYDROcodone-acetaminophen (NORCO) 10-325 MG tablet; Take 0.5 tablets by mouth every 8 (eight) hours as needed for moderate pain or severe pain.  Dispense:  30 tablet; Refill: 0 - HYDROcodone-acetaminophen (NORCO) 10-325 MG  tablet; Take 0.5 tablets by mouth every 8 (eight) hours as needed for moderate pain or severe pain.  Dispense: 30 tablet; Refill: 0   General Counseling: Susan Shah verbalizes understanding of the findings of todays visit and agrees with plan of treatment. I have discussed any further diagnostic evaluation that may be needed or ordered today. We also reviewed her medications today. she has been encouraged to call the office with any questions or concerns that should arise related to todays visit.    Orders Placed This Encounter  Procedures   POCT Urinalysis Dipstick    Meds ordered this encounter  Medications   HYDROcodone-acetaminophen (NORCO) 10-325 MG tablet    Sig: Take 0.5 tablets by mouth every 8 (eight) hours as needed for moderate pain or severe pain.    Dispense:  30 tablet    Refill:  0    Refills for 05/23/22   HYDROcodone-acetaminophen (NORCO) 10-325 MG tablet    Sig: Take 0.5 tablets by mouth every 8 (eight) hours as needed for moderate pain or severe pain.    Dispense:  30 tablet    Refill:  0    Refills for 06/20/22   HYDROcodone-acetaminophen (NORCO) 10-325 MG tablet    Sig: Take 0.5 tablets by mouth every 8 (eight) hours as needed for moderate pain or severe pain.    Dispense:  30 tablet    Refill:  0    Refills for 07/18/22    Return in about 3 months (around 08/21/2022) for F/U, pain med refill, Bufford Helms PCP.   Total time spent:30 Minutes Time spent includes review of chart, medications, test results, and follow up plan with the patient.   [place] Controlled Substance Database was reviewed by me.  This patient was seen by Sallyanne Kuster, FNP-C in collaboration with Dr. Beverely Risen as a part of collaborative care agreement.   Lorelie Biermann R. Tedd Sias, MSN, FNP-C Internal medicine

## 2022-05-24 ENCOUNTER — Other Ambulatory Visit: Payer: Self-pay | Admitting: Nurse Practitioner

## 2022-05-24 ENCOUNTER — Other Ambulatory Visit: Payer: Self-pay | Admitting: Internal Medicine

## 2022-05-24 DIAGNOSIS — Z760 Encounter for issue of repeat prescription: Secondary | ICD-10-CM

## 2022-05-29 DIAGNOSIS — M25552 Pain in left hip: Secondary | ICD-10-CM | POA: Diagnosis not present

## 2022-05-29 DIAGNOSIS — G8929 Other chronic pain: Secondary | ICD-10-CM | POA: Diagnosis not present

## 2022-05-29 DIAGNOSIS — M5134 Other intervertebral disc degeneration, thoracic region: Secondary | ICD-10-CM | POA: Diagnosis not present

## 2022-05-29 DIAGNOSIS — M5459 Other low back pain: Secondary | ICD-10-CM | POA: Diagnosis not present

## 2022-05-29 DIAGNOSIS — M47818 Spondylosis without myelopathy or radiculopathy, sacral and sacrococcygeal region: Secondary | ICD-10-CM | POA: Diagnosis not present

## 2022-06-08 ENCOUNTER — Encounter: Payer: Self-pay | Admitting: Nurse Practitioner

## 2022-06-20 ENCOUNTER — Other Ambulatory Visit: Payer: Self-pay | Admitting: Internal Medicine

## 2022-06-27 ENCOUNTER — Telehealth: Payer: Self-pay

## 2022-06-27 DIAGNOSIS — M5414 Radiculopathy, thoracic region: Secondary | ICD-10-CM | POA: Diagnosis not present

## 2022-06-27 DIAGNOSIS — G20B2 Parkinson's disease with dyskinesia, with fluctuations: Secondary | ICD-10-CM

## 2022-06-27 DIAGNOSIS — M4804 Spinal stenosis, thoracic region: Secondary | ICD-10-CM | POA: Diagnosis not present

## 2022-06-27 DIAGNOSIS — G14 Postpolio syndrome: Secondary | ICD-10-CM

## 2022-06-27 DIAGNOSIS — M461 Sacroiliitis, not elsewhere classified: Secondary | ICD-10-CM | POA: Diagnosis not present

## 2022-06-27 MED ORDER — HYDROCODONE-ACETAMINOPHEN 10-325 MG PO TABS
0.5000 | ORAL_TABLET | Freq: Three times a day (TID) | ORAL | 0 refills | Status: DC | PRN
Start: 2022-06-27 — End: 2022-07-31

## 2022-06-28 NOTE — Telephone Encounter (Signed)
Pt advised sent med

## 2022-07-03 DIAGNOSIS — M461 Sacroiliitis, not elsewhere classified: Secondary | ICD-10-CM | POA: Diagnosis not present

## 2022-07-22 DIAGNOSIS — M5414 Radiculopathy, thoracic region: Secondary | ICD-10-CM | POA: Diagnosis not present

## 2022-07-24 ENCOUNTER — Emergency Department
Admission: EM | Admit: 2022-07-24 | Discharge: 2022-07-24 | Disposition: A | Discharge status: Home / Self Care | Payer: Medicare Other | Attending: Emergency Medicine | Admitting: Emergency Medicine

## 2022-07-24 ENCOUNTER — Emergency Department: Payer: Medicare Other

## 2022-07-24 ENCOUNTER — Other Ambulatory Visit: Payer: Self-pay

## 2022-07-24 DIAGNOSIS — G20C Parkinsonism, unspecified: Secondary | ICD-10-CM | POA: Diagnosis not present

## 2022-07-24 DIAGNOSIS — I10 Essential (primary) hypertension: Secondary | ICD-10-CM | POA: Insufficient documentation

## 2022-07-24 DIAGNOSIS — D649 Anemia, unspecified: Secondary | ICD-10-CM | POA: Insufficient documentation

## 2022-07-24 DIAGNOSIS — M549 Dorsalgia, unspecified: Secondary | ICD-10-CM | POA: Diagnosis not present

## 2022-07-24 DIAGNOSIS — R1030 Lower abdominal pain, unspecified: Secondary | ICD-10-CM | POA: Diagnosis not present

## 2022-07-24 DIAGNOSIS — G8929 Other chronic pain: Secondary | ICD-10-CM | POA: Diagnosis not present

## 2022-07-24 DIAGNOSIS — R079 Chest pain, unspecified: Secondary | ICD-10-CM | POA: Diagnosis not present

## 2022-07-24 DIAGNOSIS — R0789 Other chest pain: Secondary | ICD-10-CM | POA: Insufficient documentation

## 2022-07-24 LAB — CBC
HCT: 35 % — ABNORMAL LOW (ref 36.0–46.0)
Hemoglobin: 10.9 g/dL — ABNORMAL LOW (ref 12.0–15.0)
MCH: 28.5 pg (ref 26.0–34.0)
MCHC: 31.1 g/dL (ref 30.0–36.0)
MCV: 91.4 fL (ref 80.0–100.0)
Platelets: 263 10*3/uL (ref 150–400)
RBC: 3.83 MIL/uL — ABNORMAL LOW (ref 3.87–5.11)
RDW: 13.7 % (ref 11.5–15.5)
WBC: 9.5 10*3/uL (ref 4.0–10.5)
nRBC: 0 % (ref 0.0–0.2)

## 2022-07-24 LAB — TROPONIN I (HIGH SENSITIVITY)
Troponin I (High Sensitivity): 4 ng/L (ref <18)
Troponin I (High Sensitivity): 4 ng/L (ref <18)

## 2022-07-24 LAB — BASIC METABOLIC PANEL
Anion gap: 7 (ref 5–15)
BUN: 30 mg/dL — ABNORMAL HIGH (ref 8–23)
CO2: 26 mmol/L (ref 22–32)
Calcium: 8.4 mg/dL — ABNORMAL LOW (ref 8.9–10.3)
Chloride: 104 mmol/L (ref 98–111)
Creatinine, Ser: 0.77 mg/dL (ref 0.44–1.00)
GFR, Estimated: >60 mL/min (ref >60)
Glucose, Bld: 88 mg/dL (ref 70–99)
Potassium: 3.9 mmol/L (ref 3.5–5.1)
Sodium: 137 mmol/L (ref 135–145)

## 2022-07-24 NOTE — ED Provider Notes (Signed)
[facility] Provider Note    Event Date/Time   First MD Initiated Contact with Patient 07/24/22 1244     (approximate)   History   Chest Pain   HPI  Susan Shah is a 77 y.o. female with a history of Parkinson's, hypertension, hyperlipidemia, GERD, and depression who presents with chest pain, acute onset around 11 AM, substernal in location and radiating somewhat up into her jaw.  She states that the pain was sharp.  It has now completely resolved.  She denies any associated shortness of breath or lightheadedness.  She has no nausea or vomiting.  She states that she was sitting and watching television when it happened and it was not related to exertion.  She states it feels different than when she has had GERD previously.  I reviewed the past medical records.  The patient's most recent outpatient encounter was with internal medicine on 5/2 for follow-up of lower abdominal pain and chronic back pain.  She has no recent ED visits or hospitalizations.   Physical Exam   Triage Vital Signs: ED Triage Vitals  Enc Vitals Group     BP 07/24/22 1248 138/73     Pulse Rate 07/24/22 1248 69     Resp 07/24/22 1248 17     Temp 07/24/22 1248 98.8 F (37.1 C)     Temp Source 07/24/22 1248 Oral     SpO2 07/24/22 1248 99 %     Weight 07/24/22 1244 140 lb (63.5 kg)     Height 07/24/22 1244 5\' 3"  (1.6 m)     Head Circumference --      Peak Flow --      Pain Score 07/24/22 1244 2     Pain Loc --      Pain Edu? --      Excl. in GC? --     Most recent vital signs: Vitals:   07/24/22 1248 07/24/22 1439  BP: 138/73 (!) 139/93  Pulse: 69 68  Resp: 17 18  Temp: 98.8 F (37.1 C)   SpO2: 99% 99%     General: Awake, no distress.  CV:  Good peripheral perfusion.  Normal heart sounds. Resp:  Normal effort.  Lungs CTAB. Abd:  No distention.  Other:  Trace bilateral lower extremity edema.   ED Results / Procedures / Treatments   Labs (all labs ordered are  listed, but only abnormal results are displayed) Labs Reviewed  BASIC METABOLIC PANEL - Abnormal; Notable for the following components:      Result Value   BUN 30 (*)    Calcium 8.4 (*)    All other components within normal limits  CBC - Abnormal; Notable for the following components:   RBC 3.83 (*)    Hemoglobin 10.9 (*)    HCT 35.0 (*)    All other components within normal limits  TROPONIN I (HIGH SENSITIVITY)  TROPONIN I (HIGH SENSITIVITY)     EKG  ED ECG REPORT I, Dionne Bucy, the attending physician, personally viewed and interpreted this ECG.  Date: 07/24/2022 EKG Time: 1247 Rate: 68 Rhythm: normal sinus rhythm QRS Axis: normal Intervals: normal ST/T Wave abnormalities: Nonspecific T wave abnormalities Narrative Interpretation: no evidence of acute ischemia    RADIOLOGY  Chest x-ray: I independently viewed and interpreted the images; there is no focal consolidation or edema  PROCEDURES:  Critical Care performed: No  Procedures   MEDICATIONS ORDERED IN ED: Medications - No data to display  IMPRESSION / MDM / ASSESSMENT AND PLAN / ED COURSE  I reviewed the triage vital signs and the nursing notes.  77 year old female with PMH as noted above but no significant cardiac history presents with atypical and nonexertional chest pain that lasted less than an hour and has now completely resolved.  Physical exam is unremarkable for acute findings.  EKG is nonischemic.  Differential diagnosis includes, but is not limited to, musculoskeletal pain, neuropathic pain, GERD, other benign etiology, less likely ACS.  Given that it resolved on its own it is not consistent with PE, aortic dissection, or other vascular etiology.  We will obtain chest x-ray, basic labs, troponins x 2, and reassess.  Patient's presentation is most consistent with acute complicated illness / injury requiring diagnostic workup.  The patient is on the cardiac monitor to evaluate for  evidence of arrhythmia and/or significant heart rate changes.  ----------------------------------------- 3:30 PM on 07/24/2022 -----------------------------------------  BMP and CBC show no acute findings.  CBC shows mild anemia which appears chronic.  Initial and repeat troponins are both negative.  The patient has had no further chest pain while in the ED.  There is no evidence of cardiac etiology of her symptoms.  The patient feels well and would like to go home.  At this time there is no indication for further ED workup or inpatient admission.  I counseled her on the results of the workup.  I recommend she follow-up with her primary care provider.  I gave strict return precautions and she expressed understanding.   FINAL CLINICAL IMPRESSION(S) / ED DIAGNOSES   Final diagnoses:  Atypical chest pain     Rx / DC Orders   ED Discharge Orders     None        Note:  This document was prepared using Dragon voice recognition software and may include unintentional dictation errors.    Dionne Bucy, MD 07/24/22 1530

## 2022-07-24 NOTE — ED Triage Notes (Signed)
Pt to er room number 15 via ems, per ems pt was watching tv when she started having some chest pain going into her L arm and neck, denies dizziness.

## 2022-07-24 NOTE — ED Notes (Signed)
Pt in bed, family at bedside, pt denies pain, pt states that she is ready to go home, pt verbalized understanding d/c and follow up, pt from department via wc.

## 2022-07-24 NOTE — Discharge Instructions (Signed)
Return to the ER for new, worsening, or persistent severe chest pain, difficulty breathing, weakness or lightheadedness, or any other new or worsening symptoms that concern you.  Follow-up with your primary care provider. °

## 2022-07-30 ENCOUNTER — Telehealth: Payer: Self-pay

## 2022-07-30 NOTE — Telephone Encounter (Signed)
Transition Care Management Follow-up Telephone Call Date of discharge and from where: 07/24/2022 [facility] How have you been since you were released from the hospital? Patient is feeling better Any questions or concerns? No  Items Reviewed: Did the pt receive and understand the discharge instructions provided? Yes  Medications obtained and verified? Yes  Other? No  Any new allergies since your discharge? No  Dietary orders reviewed? Yes Do you have support at home? Yes   Follow up appointments reviewed:  PCP Hospital f/u appt confirmed? Yes  Scheduled to see Sallyanne Kuster, NP on 07/31/2022 @ [facility]. Specialist Hospital f/u appt confirmed? No  Scheduled to see  on  @ . Are transportation arrangements needed? No  If their condition worsens, is the pt aware to call PCP or go to the Emergency Dept.? Yes Was the patient provided with contact information for the PCP's office or ED? Yes Was to pt encouraged to call back with questions or concerns? Yes  Annali Lybrand [facility]  [facility] Population Health Community Resource Care Guide   [email or web address]  ?? [phone]   Website: [email or web address]  [email or web address]

## 2022-07-31 ENCOUNTER — Ambulatory Visit (INDEPENDENT_AMBULATORY_CARE_PROVIDER_SITE_OTHER): Payer: Medicare Other | Admitting: Nurse Practitioner

## 2022-07-31 ENCOUNTER — Encounter: Payer: Self-pay | Admitting: Nurse Practitioner

## 2022-07-31 VITALS — BP 130/78 | HR 75 | Temp 98.6°F | Resp 16 | Ht 63.0 in | Wt 147.8 lb

## 2022-07-31 DIAGNOSIS — G14 Postpolio syndrome: Secondary | ICD-10-CM | POA: Diagnosis not present

## 2022-07-31 DIAGNOSIS — K219 Gastro-esophageal reflux disease without esophagitis: Secondary | ICD-10-CM | POA: Diagnosis not present

## 2022-07-31 DIAGNOSIS — G20B2 Parkinson's disease with dyskinesia, with fluctuations: Secondary | ICD-10-CM

## 2022-07-31 DIAGNOSIS — Z23 Encounter for immunization: Secondary | ICD-10-CM | POA: Diagnosis not present

## 2022-07-31 MED ORDER — HYDROCODONE-ACETAMINOPHEN 10-325 MG PO TABS
0.5000 | ORAL_TABLET | Freq: Three times a day (TID) | ORAL | 0 refills | Status: DC | PRN
Start: 2022-09-25 — End: 2022-11-12

## 2022-07-31 MED ORDER — HYDROCODONE-ACETAMINOPHEN 10-325 MG PO TABS
0.5000 | ORAL_TABLET | Freq: Three times a day (TID) | ORAL | 0 refills | Status: DC | PRN
Start: 2022-08-28 — End: 2022-11-12

## 2022-07-31 MED ORDER — FAMOTIDINE 40 MG PO TABS
40.0000 mg | ORAL_TABLET | Freq: Two times a day (BID) | ORAL | 1 refills | Status: DC
Start: 2022-07-31 — End: 2023-02-24

## 2022-07-31 MED ORDER — HYDROCODONE-ACETAMINOPHEN 10-325 MG PO TABS
0.5000 | ORAL_TABLET | Freq: Three times a day (TID) | ORAL | 0 refills | Status: DC | PRN
Start: 2022-07-31 — End: 2022-11-12

## 2022-07-31 NOTE — Progress Notes (Signed)
[facility] [address] [place], [place] [address]  Internal MEDICINE  Office Visit Note  Patient Name: Susan Shah  [id number]  [id number]  Date of Service: 07/31/2022  Chief Complaint  Patient presents with   Follow-up    ED follow up    HPI Hang presents for a follow-up visit for visit to ED for chest pain. Turned out to be GERD.  Went to ED for chest pain -- cardiac work up was negative  Was told the chest pain was from GERD. Has severe acid reflux and is not currently controled by twice daily pantoprazole and once daily famotidine.  -- has tried nexium, famotidine, reglan, pantoprazole, dexlansoprazole, omeprazole.  Due for routine pain medication refills for her chronic pain.  Due for shingles vaccine    Current Medication: Outpatient Encounter Medications as of 07/31/2022  Medication Sig   acetaminophen (TYLENOL) 500 MG tablet Take 1,000 mg by mouth every 6 (six) hours as needed for moderate pain or headache.   amLODipine (NORVASC) 2.5 MG tablet TAKE 1 TABLET BY MOUTH EVERY DAY - STOP LISINOPRIL   buPROPion (WELLBUTRIN XL) 150 MG 24 hr tablet Take 1 tablet (150 mg total) by mouth daily.   Carbidopa-Levodopa ER (SINEMET CR) 25-100 MG tablet controlled release Take 1 tablet by mouth 3 (three) times daily.   Carboxymethylcellul-Glycerin (LUBRICATING EYE DROPS OP) Place 1 drop into both eyes daily as needed (dry eyes).   cholecalciferol (VITAMIN D3) 25 MCG (1000 UNIT) tablet Take 1,000 Units by mouth daily.   escitalopram (LEXAPRO) 20 MG tablet TAKE 1 TABLET BY MOUTH EVERY DAY   famotidine (PEPCID) 40 MG tablet Take 1 tablet (40 mg total) by mouth 2 (two) times daily.   fluticasone (FLONASE) 50 MCG/ACT nasal spray Place 1 spray into both nostrils at bedtime as needed for allergies or rhinitis.   loratadine (QC LORATADINE ALLERGY RELIEF) 10 MG tablet Take 1 tablet (10 mg total) by mouth daily.   losartan (COZAAR) 100 MG tablet TAKE 1 TABLET BY MOUTH EVERY DAY    metoprolol tartrate (LOPRESSOR) 25 MG tablet TAKE 1 TABLET (25 MG TOTAL) BY MOUTH TWICE A DAY WITH MEALS   ondansetron (ZOFRAN-ODT) 4 MG disintegrating tablet Take 1 tablet (4 mg total) by mouth every 8 (eight) hours as needed for nausea or vomiting.   oxybutynin (DITROPAN-XL) 10 MG 24 hr tablet TAKE 2 TABLETS BY MOUTH EVERY DAY   pantoprazole (PROTONIX) 40 MG tablet TAKE 1 TABLET BY MOUTH TWICE A DAY   potassium chloride SA (KLOR-CON M) 20 MEQ tablet Take 1 tablet (20 mEq total) by mouth daily.   rOPINIRole (REQUIP) 0.25 MG tablet Take by mouth.   simvastatin (ZOCOR) 20 MG tablet TAKE 1 TABLET BY MOUTH DAILY AT 6 PM.   vitamin B-12 (CYANOCOBALAMIN) 1000 MCG tablet Take 1,000 mcg by mouth daily.   [DISCONTINUED] famotidine (PEPCID) 20 MG tablet TAKE 1 TABLET (20 MG TOTAL) BY MOUTH 2 (TWO) TIMES DAILY AS NEEDED FOR HEARTBURN OR INDIGESTION.   [DISCONTINUED] HYDROcodone-acetaminophen (NORCO) 10-325 MG tablet Take 0.5 tablets by mouth every 8 (eight) hours as needed for moderate pain or severe pain.   [DISCONTINUED] HYDROcodone-acetaminophen (NORCO) 10-325 MG tablet Take 0.5 tablets by mouth every 8 (eight) hours as needed for moderate pain or severe pain.   [DISCONTINUED] HYDROcodone-acetaminophen (NORCO) 10-325 MG tablet Take 0.5 tablets by mouth every 8 (eight) hours as needed for moderate pain or severe pain.   HYDROcodone-acetaminophen (NORCO) 10-325 MG tablet Take 0.5 tablets by  mouth every 8 (eight) hours as needed for moderate pain or severe pain.   [START ON 08/28/2022] HYDROcodone-acetaminophen (NORCO) 10-325 MG tablet Take 0.5 tablets by mouth every 8 (eight) hours as needed for moderate pain or severe pain.   [START ON 09/25/2022] HYDROcodone-acetaminophen (NORCO) 10-325 MG tablet Take 0.5 tablets by mouth every 8 (eight) hours as needed for moderate pain or severe pain.   Zoster Vaccine Adjuvanted [facility]) injection Inject 0.5 mLs into the muscle once for 1 dose.   [DISCONTINUED] Zoster  Vaccine Adjuvanted [facility]) injection Inject 0.5 mLs into the muscle once.   No facility-administered encounter medications on file as of 07/31/2022.    Surgical History: Past Surgical History:  Procedure Laterality Date   43 HOUR PH STUDY N/A 04/24/2016   Procedure: 24 HOUR PH STUDY;  Surgeon: Midge Minium, MD;  Location: [facility] ENDOSCOPY;  Service: Endoscopy;  Laterality: N/A;   ABDOMINAL HYSTERECTOMY     Total   APPENDECTOMY     CARPOMETACARPAL ([facility]) FUSION OF THUMB Left 05/16/2016   Procedure: CARPOMETACARPAL [facility]) FUSION OF THUMB;  Surgeon: Kennedy Bucker, MD;  Location: [facility] ORS;  Service: Orthopedics;  Laterality: Left;   CARPOMETACARPAL (CMC) FUSION OF THUMB Left 03/30/2019   Procedure: LEFT THUMB SUSPENSION PLASTY;  Surgeon: Kennedy Bucker, MD;  Location: [facility] ORS;  Service: Orthopedics;  Laterality: Left;   CATARACT EXTRACTION Bilateral 12/2012   CHOLECYSTECTOMY     COLONOSCOPY  2003   ESOPHAGEAL MANOMETRY N/A 04/24/2016   Procedure: ESOPHAGEAL MANOMETRY (EM);  Surgeon: Midge Minium, MD;  Location: [facility] ENDOSCOPY;  Service: Endoscopy;  Laterality: N/A;   ESOPHAGOGASTRODUODENOSCOPY  08/2011   FOOT SURGERY     HALLUX VALGUS CORRECTION     HIP SURGERY Right 1950   tendon release r/t polio   KNEE ARTHROSCOPY Left 06/23/2008   RETINAL DETACHMENT SURGERY Right 03/2014   ROTATOR CUFF REPAIR Bilateral    URINARY SURGERY  2014   [place]    Medical History: Past Medical History:  Diagnosis Date   Anxiety    Asthma 2015   mild, seasonal allergy triggered.   Cancer (HCC) 2013   skin cancer  on left hand   Depression    Esophageal dysmotility    GERD (gastroesophageal reflux disease) 11/05/2012   History of hiatal hernia    Hyperlipidemia    Hypertension    Incontinence of urine    Lung nodule    OA (osteoarthritis)    Obesity    Parkinson's disease    PONV (postoperative nausea and vomiting)    Post-polio syndrome    contracted at 26 months old   Pulmonary nodule 11/05/2012    RML   Shingles 2009   Sleep apnea     Family History: Family History  Problem Relation Age of Onset   Heart disease Mother    Heart disease Father    Heart attack Sister    Heart disease Brother    Heart attack Brother    Breast cancer Maternal Aunt    Stroke Maternal Grandmother    Heart disease Maternal Grandfather     Social History   Socioeconomic History   Marital status: Married    Spouse name: Not on file   Number of children: Not on file   Years of education: Not on file   Highest education level: Not on file  Occupational History   Not on file  Tobacco Use   Smoking status: Former    Current packs/day: 0.00    Types: Cigarettes  Quit date: 05/09/1968    Years since quitting: 54.2   Smokeless tobacco: Never  Vaping Use   Vaping status: Never Used  Substance and Sexual Activity   Alcohol use: No   Drug use: No   Sexual activity: Not on file  Other Topics Concern   Not on file  Social History Narrative   Not on file   Social Determinants of Health   Financial Resource Strain: Not on file  Food Insecurity: Not on file  Transportation Needs: Not on file  Physical Activity: Not on file  Stress: Not on file  Social Connections: Not on file  Intimate Partner Violence: Not on file      Review of Systems  Constitutional:  Negative for appetite change, chills, fatigue and unexpected weight change.  HENT:  Negative for congestion, rhinorrhea, sneezing and sore throat.   Eyes:  Negative for redness.  Respiratory: Negative.  Negative for cough, chest tightness, shortness of breath and wheezing.   Cardiovascular:  Positive for chest pain (was having chest pain that was linked to GERD and cardiac issues were ruled out.). Negative for palpitations.  Gastrointestinal:  Positive for nausea. Negative for abdominal pain, constipation and diarrhea.  Genitourinary:  Negative for dysuria and frequency.  Musculoskeletal:  Negative for arthralgias, back pain,  joint swelling and neck pain.  Skin:  Negative for rash.  Neurological: Negative.  Negative for tremors and numbness.  Hematological:  Negative for adenopathy. Does not bruise/bleed easily.  Psychiatric/Behavioral:  Negative for behavioral problems (Depression), sleep disturbance and suicidal ideas. The patient is not nervous/anxious.     Vital Signs: BP 130/78   Pulse 75   Temp 98.6 F (37 C)   Resp 16   Ht 5\' 3"  (1.6 m)   Wt 147 lb 12.8 oz (67 kg)   SpO2 96%   BMI 26.18 kg/m    Physical Exam Vitals reviewed.  Constitutional:      General: She is not in acute distress.    Appearance: Normal appearance. She is not ill-appearing.  HENT:     Head: Normocephalic and atraumatic.  Eyes:     Pupils: Pupils are equal, round, and reactive to light.  Cardiovascular:     Rate and Rhythm: Normal rate and regular rhythm.  Pulmonary:     Effort: Pulmonary effort is normal. No respiratory distress.  Neurological:     Mental Status: She is alert and oriented to person, place, and time.  Psychiatric:        Mood and Affect: Mood normal.        Behavior: Behavior normal.        Assessment/Plan: 1. Gastroesophageal reflux disease without esophagitis Increase famotidine to twice daily. Continue pantoprazole as well. Follow up as needed if new regimen is not effective.  - famotidine (PEPCID) 40 MG tablet; Take 1 tablet (40 mg total) by mouth 2 (two) times daily.  Dispense: 180 tablet; Refill: 1  2. Post-polio syndrome Refills ordered x3 months, continue hydrocodone prn as prescribed, follow up in 3 months for additional refills.  - HYDROcodone-acetaminophen (NORCO) 10-325 MG tablet; Take 0.5 tablets by mouth every 8 (eight) hours as needed for moderate pain or severe pain.  Dispense: 30 tablet; Refill: 0 - HYDROcodone-acetaminophen (NORCO) 10-325 MG tablet; Take 0.5 tablets by mouth every 8 (eight) hours as needed for moderate pain or severe pain.  Dispense: 30 tablet; Refill: 0 -  HYDROcodone-acetaminophen (NORCO) 10-325 MG tablet; Take 0.5 tablets by mouth every 8 (eight) hours as  needed for moderate pain or severe pain.  Dispense: 30 tablet; Refill: 0  3. Parkinson's disease with dyskinesia and fluctuating manifestations Refills x3 months, follow up in 3 months for additional refills  - HYDROcodone-acetaminophen (NORCO) 10-325 MG tablet; Take 0.5 tablets by mouth every 8 (eight) hours as needed for moderate pain or severe pain.  Dispense: 30 tablet; Refill: 0 - HYDROcodone-acetaminophen (NORCO) 10-325 MG tablet; Take 0.5 tablets by mouth every 8 (eight) hours as needed for moderate pain or severe pain.  Dispense: 30 tablet; Refill: 0 - HYDROcodone-acetaminophen (NORCO) 10-325 MG tablet; Take 0.5 tablets by mouth every 8 (eight) hours as needed for moderate pain or severe pain.  Dispense: 30 tablet; Refill: 0  4. Need for vaccination - Zoster Vaccine Adjuvanted [facility] injection; Inject 0.5 mLs into the muscle once for 1 dose.  Dispense: 0.5 mL; Refill: 0    General Counseling: Lucrecia verbalizes understanding of the findings of todays visit and agrees with plan of treatment. I have discussed any further diagnostic evaluation that may be needed or ordered today. We also reviewed her medications today. she has been encouraged to call the office with any questions or concerns that should arise related to todays visit.    No orders of the defined types were placed in this encounter.   Meds ordered this encounter  Medications   famotidine (PEPCID) 40 MG tablet    Sig: Take 1 tablet (40 mg total) by mouth 2 (two) times daily.    Dispense:  180 tablet    Refill:  1   HYDROcodone-acetaminophen (NORCO) 10-325 MG tablet    Sig: Take 0.5 tablets by mouth every 8 (eight) hours as needed for moderate pain or severe pain.    Dispense:  30 tablet    Refill:  0    Refills for july   HYDROcodone-acetaminophen (NORCO) 10-325 MG tablet    Sig: Take 0.5 tablets by mouth every 8  (eight) hours as needed for moderate pain or severe pain.    Dispense:  30 tablet    Refill:  0    Refills for August   HYDROcodone-acetaminophen (NORCO) 10-325 MG tablet    Sig: Take 0.5 tablets by mouth every 8 (eight) hours as needed for moderate pain or severe pain.    Dispense:  30 tablet    Refill:  0    Refills for september   Zoster Vaccine Adjuvanted [facility]) injection    Sig: Inject 0.5 mLs into the muscle once for 1 dose.    Dispense:  0.5 mL    Refill:  0    Return in about 12 weeks (around 10/23/2022) for F/U, pain med refill, Amerigo Mcglory PCP.   Total time spent:30 Minutes Time spent includes review of chart, medications, test results, and follow up plan with the patient.   [place] Controlled Substance Database was reviewed by me.  This patient was seen by Sallyanne Kuster, FNP-C in collaboration with Dr. Beverely Risen as a part of collaborative care agreement.   Calena Salem R. Tedd Sias, MSN, FNP-C Internal medicine

## 2022-08-03 ENCOUNTER — Encounter: Payer: Self-pay | Admitting: Nurse Practitioner

## 2022-08-03 MED ORDER — ZOSTER VAC RECOMB ADJUVANTED 50 MCG/0.5ML IM SUSR
0.5000 mL | Freq: Once | INTRAMUSCULAR | 0 refills | Status: AC
Start: 2022-08-03 — End: 2022-08-03

## 2022-08-12 DIAGNOSIS — M4804 Spinal stenosis, thoracic region: Secondary | ICD-10-CM | POA: Diagnosis not present

## 2022-08-12 DIAGNOSIS — M5414 Radiculopathy, thoracic region: Secondary | ICD-10-CM | POA: Diagnosis not present

## 2022-08-12 DIAGNOSIS — M461 Sacroiliitis, not elsewhere classified: Secondary | ICD-10-CM | POA: Diagnosis not present

## 2022-08-12 DIAGNOSIS — M5416 Radiculopathy, lumbar region: Secondary | ICD-10-CM | POA: Diagnosis not present

## 2022-08-14 ENCOUNTER — Other Ambulatory Visit: Payer: Self-pay | Admitting: Family Medicine

## 2022-08-14 DIAGNOSIS — M5416 Radiculopathy, lumbar region: Secondary | ICD-10-CM

## 2022-08-23 ENCOUNTER — Ambulatory Visit: Payer: Medicare Other | Admitting: Nurse Practitioner

## 2022-08-26 DIAGNOSIS — K080 Exfoliation of teeth due to systemic causes: Secondary | ICD-10-CM | POA: Diagnosis not present

## 2022-08-29 ENCOUNTER — Ambulatory Visit
Admission: RE | Admit: 2022-08-29 | Discharge: 2022-08-29 | Disposition: A | Discharge status: Home / Self Care | Payer: Medicare Other | Source: Ambulatory Visit | Attending: Family Medicine | Admitting: Family Medicine

## 2022-08-29 DIAGNOSIS — M5416 Radiculopathy, lumbar region: Secondary | ICD-10-CM

## 2022-08-30 DIAGNOSIS — R441 Visual hallucinations: Secondary | ICD-10-CM | POA: Diagnosis not present

## 2022-08-30 DIAGNOSIS — G20B1 Parkinson's disease with dyskinesia, without mention of fluctuations: Secondary | ICD-10-CM | POA: Diagnosis not present

## 2022-08-30 DIAGNOSIS — R011 Cardiac murmur, unspecified: Secondary | ICD-10-CM | POA: Diagnosis not present

## 2022-08-30 DIAGNOSIS — Z8659 Personal history of other mental and behavioral disorders: Secondary | ICD-10-CM | POA: Diagnosis not present

## 2022-09-05 DIAGNOSIS — K080 Exfoliation of teeth due to systemic causes: Secondary | ICD-10-CM | POA: Diagnosis not present

## 2022-09-17 DIAGNOSIS — M5416 Radiculopathy, lumbar region: Secondary | ICD-10-CM | POA: Diagnosis not present

## 2022-09-18 DIAGNOSIS — M5416 Radiculopathy, lumbar region: Secondary | ICD-10-CM | POA: Diagnosis not present

## 2022-09-18 DIAGNOSIS — M5136 Other intervertebral disc degeneration, lumbar region: Secondary | ICD-10-CM | POA: Diagnosis not present

## 2022-09-18 DIAGNOSIS — M4804 Spinal stenosis, thoracic region: Secondary | ICD-10-CM | POA: Diagnosis not present

## 2022-09-18 DIAGNOSIS — M5414 Radiculopathy, thoracic region: Secondary | ICD-10-CM | POA: Diagnosis not present

## 2022-09-24 DIAGNOSIS — M5416 Radiculopathy, lumbar region: Secondary | ICD-10-CM | POA: Diagnosis not present

## 2022-10-07 DIAGNOSIS — M47816 Spondylosis without myelopathy or radiculopathy, lumbar region: Secondary | ICD-10-CM | POA: Diagnosis not present

## 2022-10-11 ENCOUNTER — Other Ambulatory Visit: Payer: Self-pay

## 2022-10-11 DIAGNOSIS — Z760 Encounter for issue of repeat prescription: Secondary | ICD-10-CM

## 2022-10-11 MED ORDER — BUPROPION HCL ER (XL) 150 MG PO TB24
150.0000 mg | ORAL_TABLET | Freq: Every day | ORAL | 1 refills | Status: DC
Start: 2022-10-11 — End: 2023-03-25

## 2022-10-15 DIAGNOSIS — M5416 Radiculopathy, lumbar region: Secondary | ICD-10-CM | POA: Diagnosis not present

## 2022-10-17 ENCOUNTER — Other Ambulatory Visit: Payer: Self-pay | Admitting: Nurse Practitioner

## 2022-10-17 DIAGNOSIS — Z760 Encounter for issue of repeat prescription: Secondary | ICD-10-CM

## 2022-10-22 DIAGNOSIS — M5416 Radiculopathy, lumbar region: Secondary | ICD-10-CM | POA: Diagnosis not present

## 2022-10-23 ENCOUNTER — Ambulatory Visit: Payer: Medicare Other | Admitting: Nurse Practitioner

## 2022-10-28 DIAGNOSIS — M47816 Spondylosis without myelopathy or radiculopathy, lumbar region: Secondary | ICD-10-CM | POA: Diagnosis not present

## 2022-11-05 DIAGNOSIS — M5416 Radiculopathy, lumbar region: Secondary | ICD-10-CM | POA: Diagnosis not present

## 2022-11-12 ENCOUNTER — Ambulatory Visit (INDEPENDENT_AMBULATORY_CARE_PROVIDER_SITE_OTHER): Payer: Medicare Other | Admitting: Nurse Practitioner

## 2022-11-12 ENCOUNTER — Encounter: Payer: Self-pay | Admitting: Nurse Practitioner

## 2022-11-12 VITALS — BP 138/78 | HR 70 | Temp 98.7°F | Resp 16 | Ht 63.0 in | Wt 152.0 lb

## 2022-11-12 DIAGNOSIS — G14 Postpolio syndrome: Secondary | ICD-10-CM

## 2022-11-12 DIAGNOSIS — G20B2 Parkinson's disease with dyskinesia, with fluctuations: Secondary | ICD-10-CM

## 2022-11-12 DIAGNOSIS — M150 Primary generalized (osteo)arthritis: Secondary | ICD-10-CM

## 2022-11-12 DIAGNOSIS — K219 Gastro-esophageal reflux disease without esophagitis: Secondary | ICD-10-CM

## 2022-11-12 DIAGNOSIS — Z760 Encounter for issue of repeat prescription: Secondary | ICD-10-CM

## 2022-11-12 DIAGNOSIS — Z79899 Other long term (current) drug therapy: Secondary | ICD-10-CM

## 2022-11-12 DIAGNOSIS — R112 Nausea with vomiting, unspecified: Secondary | ICD-10-CM

## 2022-11-12 MED ORDER — OXYBUTYNIN CHLORIDE ER 10 MG PO TB24
20.0000 mg | ORAL_TABLET | Freq: Every day | ORAL | 1 refills | Status: DC
Start: 2022-11-12 — End: 2023-03-25

## 2022-11-12 MED ORDER — ESCITALOPRAM OXALATE 20 MG PO TABS: 20.0000 mg | ORAL_TABLET | Freq: Every day | ORAL | 1 refills | Status: DC

## 2022-11-12 MED ORDER — FLUTICASONE PROPIONATE 50 MCG/ACT NA SUSP
1.0000 | Freq: Every evening | NASAL | 1 refills | Status: AC | PRN
Start: 2022-11-12 — End: ?

## 2022-11-12 MED ORDER — LOSARTAN POTASSIUM 100 MG PO TABS: 100.0000 mg | ORAL_TABLET | Freq: Every day | ORAL | 1 refills | Status: DC

## 2022-11-12 MED ORDER — METOPROLOL TARTRATE 25 MG PO TABS
ORAL_TABLET | ORAL | 1 refills | Status: DC
Start: 2022-11-12 — End: 2023-03-25

## 2022-11-12 MED ORDER — SIMVASTATIN 20 MG PO TABS: ORAL_TABLET | ORAL | 1 refills | Status: DC

## 2022-11-12 MED ORDER — HYDROCODONE-ACETAMINOPHEN 10-325 MG PO TABS
0.5000 | ORAL_TABLET | Freq: Three times a day (TID) | ORAL | 0 refills | Status: DC | PRN
Start: 2022-11-12 — End: 2023-03-25

## 2022-11-12 MED ORDER — ONDANSETRON 4 MG PO TBDP
4.0000 mg | ORAL_TABLET | Freq: Three times a day (TID) | ORAL | 1 refills | Status: DC | PRN
Start: 2022-11-12 — End: 2023-05-06

## 2022-11-12 MED ORDER — HYDROCODONE-ACETAMINOPHEN 10-325 MG PO TABS
0.5000 | ORAL_TABLET | Freq: Three times a day (TID) | ORAL | 0 refills | Status: DC | PRN
Start: 2023-01-07 — End: 2023-02-19

## 2022-11-12 MED ORDER — POTASSIUM CHLORIDE CRYS ER 20 MEQ PO TBCR: 20.0000 meq | EXTENDED_RELEASE_TABLET | Freq: Every day | ORAL | 1 refills | Status: DC

## 2022-11-12 MED ORDER — HYDROCODONE-ACETAMINOPHEN 10-325 MG PO TABS
0.5000 | ORAL_TABLET | Freq: Three times a day (TID) | ORAL | 0 refills | Status: DC | PRN
Start: 2022-12-10 — End: 2023-03-25

## 2022-11-12 MED ORDER — PANTOPRAZOLE SODIUM 40 MG PO TBEC
40.0000 mg | DELAYED_RELEASE_TABLET | Freq: Two times a day (BID) | ORAL | 1 refills | Status: DC
Start: 2022-11-12 — End: 2023-03-11

## 2022-11-12 NOTE — Progress Notes (Signed)
[facility] [address] [place], [place] [address]  Internal MEDICINE  Office Visit Note  Patient Name: Susan Shah  [id number]  [id number]  Date of Service: 11/12/2022  Chief Complaint  Patient presents with   Depression   Gastroesophageal Reflux   Hypertension   Hyperlipidemia   Follow-up    HPI Susan Shah presents for a follow-up visit for arthritis, parkinsons, and multiple refills.  Osteoarthritis -- takes hydrocodone as needed.  Had recent covid booster Parkinsons -- sees neurology, currently taking sinemet and ropinirole. This helps control her tremors some but they are still very visible.  Post-polio syndrome -- walking well with her brace Due for several refills today, will review medication list as well.  GERD -- was having issues with burning chest pain at her previous visit and cardiac issues were ruled out. Doing much better with pantoprazole.     Current Medication: Outpatient Encounter Medications as of 11/12/2022  Medication Sig   acetaminophen (TYLENOL) 500 MG tablet Take 1,000 mg by mouth every 6 (six) hours as needed for moderate pain or headache.   amLODipine (NORVASC) 2.5 MG tablet TAKE 1 TABLET BY MOUTH EVERY DAY- STOP LISINOPRIL   buPROPion (WELLBUTRIN XL) 150 MG 24 hr tablet Take 1 tablet (150 mg total) by mouth daily.   Carbidopa-Levodopa ER (SINEMET CR) 25-100 MG tablet controlled release Take 1 tablet by mouth 3 (three) times daily.   Carboxymethylcellul-Glycerin (LUBRICATING EYE DROPS OP) Place 1 drop into both eyes daily as needed (dry eyes).   cholecalciferol (VITAMIN D3) 25 MCG (1000 UNIT) tablet Take 1,000 Units by mouth daily.   famotidine (PEPCID) 40 MG tablet Take 1 tablet (40 mg total) by mouth 2 (two) times daily.   loratadine (QC LORATADINE ALLERGY RELIEF) 10 MG tablet Take 1 tablet (10 mg total) by mouth daily.   rOPINIRole (REQUIP) 0.25 MG tablet Take by mouth.   vitamin B-12 (CYANOCOBALAMIN) 1000 MCG tablet Take 1,000 mcg  by mouth daily.   [DISCONTINUED] escitalopram (LEXAPRO) 20 MG tablet TAKE 1 TABLET BY MOUTH EVERY DAY   [DISCONTINUED] fluticasone (FLONASE) 50 MCG/ACT nasal spray Place 1 spray into both nostrils at bedtime as needed for allergies or rhinitis.   [DISCONTINUED] HYDROcodone-acetaminophen (NORCO) 10-325 MG tablet Take 0.5 tablets by mouth every 8 (eight) hours as needed for moderate pain or severe pain.   [DISCONTINUED] HYDROcodone-acetaminophen (NORCO) 10-325 MG tablet Take 0.5 tablets by mouth every 8 (eight) hours as needed for moderate pain or severe pain.   [DISCONTINUED] HYDROcodone-acetaminophen (NORCO) 10-325 MG tablet Take 0.5 tablets by mouth every 8 (eight) hours as needed for moderate pain or severe pain.   [DISCONTINUED] losartan (COZAAR) 100 MG tablet TAKE 1 TABLET BY MOUTH EVERY DAY   [DISCONTINUED] metoprolol tartrate (LOPRESSOR) 25 MG tablet TAKE 1 TABLET (25 MG TOTAL) BY MOUTH TWICE A DAY WITH MEALS   [DISCONTINUED] ondansetron (ZOFRAN-ODT) 4 MG disintegrating tablet Take 1 tablet (4 mg total) by mouth every 8 (eight) hours as needed for nausea or vomiting.   [DISCONTINUED] oxybutynin (DITROPAN-XL) 10 MG 24 hr tablet TAKE 2 TABLETS BY MOUTH EVERY DAY   [DISCONTINUED] pantoprazole (PROTONIX) 40 MG tablet TAKE 1 TABLET BY MOUTH TWICE A DAY   [DISCONTINUED] potassium chloride SA (KLOR-CON M) 20 MEQ tablet Take 1 tablet (20 mEq total) by mouth daily.   [DISCONTINUED] simvastatin (ZOCOR) 20 MG tablet TAKE 1 TABLET BY MOUTH DAILY AT 6 PM.   escitalopram (LEXAPRO) 20 MG tablet Take 1 tablet (20 mg total) by  mouth daily.   fluticasone (FLONASE) 50 MCG/ACT nasal spray Place 1 spray into both nostrils at bedtime as needed for allergies or rhinitis.   [START ON 01/07/2023] HYDROcodone-acetaminophen (NORCO) 10-325 MG tablet Take 0.5 tablets by mouth every 8 (eight) hours as needed for moderate pain (pain score 4-6) or severe pain (pain score 7-10).   [START ON 12/10/2022]  HYDROcodone-acetaminophen (NORCO) 10-325 MG tablet Take 0.5 tablets by mouth every 8 (eight) hours as needed for moderate pain (pain score 4-6) or severe pain (pain score 7-10).   HYDROcodone-acetaminophen (NORCO) 10-325 MG tablet Take 0.5 tablets by mouth every 8 (eight) hours as needed for moderate pain (pain score 4-6) or severe pain (pain score 7-10).   losartan (COZAAR) 100 MG tablet Take 1 tablet (100 mg total) by mouth daily.   metoprolol tartrate (LOPRESSOR) 25 MG tablet TAKE 1 TABLET (25 MG TOTAL) BY MOUTH TWICE A DAY WITH MEALS   ondansetron (ZOFRAN-ODT) 4 MG disintegrating tablet Take 1 tablet (4 mg total) by mouth every 8 (eight) hours as needed for nausea or vomiting.   oxybutynin (DITROPAN-XL) 10 MG 24 hr tablet Take 2 tablets (20 mg total) by mouth daily.   pantoprazole (PROTONIX) 40 MG tablet Take 1 tablet (40 mg total) by mouth 2 (two) times daily.   potassium chloride SA (KLOR-CON M) 20 MEQ tablet Take 1 tablet (20 mEq total) by mouth daily.   simvastatin (ZOCOR) 20 MG tablet TAKE 1 TABLET BY MOUTH DAILY AT 6 PM.   No facility-administered encounter medications on file as of 11/12/2022.    Surgical History: Past Surgical History:  Procedure Laterality Date   25 HOUR PH STUDY N/A 04/24/2016   Procedure: 24 HOUR PH STUDY;  Surgeon: Midge Minium, MD;  Location: [facility] ENDOSCOPY;  Service: Endoscopy;  Laterality: N/A;   ABDOMINAL HYSTERECTOMY     Total   APPENDECTOMY     CARPOMETACARPAL (CMC) FUSION OF THUMB Left 05/16/2016   Procedure: CARPOMETACARPAL [facility]) FUSION OF THUMB;  Surgeon: Kennedy Bucker, MD;  Location: [facility] ORS;  Service: Orthopedics;  Laterality: Left;   CARPOMETACARPAL (CMC) FUSION OF THUMB Left 03/30/2019   Procedure: LEFT THUMB SUSPENSION PLASTY;  Surgeon: Kennedy Bucker, MD;  Location: [facility] ORS;  Service: Orthopedics;  Laterality: Left;   CATARACT EXTRACTION Bilateral 12/2012   CHOLECYSTECTOMY     COLONOSCOPY  2003   ESOPHAGEAL MANOMETRY N/A 04/24/2016   Procedure:  ESOPHAGEAL MANOMETRY (EM);  Surgeon: Midge Minium, MD;  Location: [facility] ENDOSCOPY;  Service: Endoscopy;  Laterality: N/A;   ESOPHAGOGASTRODUODENOSCOPY  08/2011   FOOT SURGERY     HALLUX VALGUS CORRECTION     HIP SURGERY Right 1950   tendon release r/t polio   KNEE ARTHROSCOPY Left 06/23/2008   RETINAL DETACHMENT SURGERY Right 03/2014   ROTATOR CUFF REPAIR Bilateral    URINARY SURGERY  2014   [place]    Medical History: Past Medical History:  Diagnosis Date   Anxiety    Asthma 2015   mild, seasonal allergy triggered.   Cancer [facility]) 2013   skin cancer  on left hand   Depression    Esophageal dysmotility    GERD (gastroesophageal reflux disease) 11/05/2012   History of hiatal hernia    Hyperlipidemia    Hypertension    Incontinence of urine    Lung nodule    OA (osteoarthritis)    Obesity    Parkinson's disease (HCC)    PONV (postoperative nausea and vomiting)    Post-polio syndrome    contracted  at 16 months old   Pulmonary nodule 11/05/2012   RML   Shingles 2009   Sleep apnea     Family History: Family History  Problem Relation Age of Onset   Heart disease Mother    Heart disease Father    Heart attack Sister    Heart disease Brother    Heart attack Brother    Breast cancer Maternal Aunt    Stroke Maternal Grandmother    Heart disease Maternal Grandfather     Social History   Socioeconomic History   Marital status: Married    Spouse name: Not on file   Number of children: Not on file   Years of education: Not on file   Highest education level: Not on file  Occupational History   Not on file  Tobacco Use   Smoking status: Former    Current packs/day: 0.00    Types: Cigarettes    Quit date: 05/09/1968    Years since quitting: 54.5   Smokeless tobacco: Never  Vaping Use   Vaping status: Never Used  Substance and Sexual Activity   Alcohol use: No   Drug use: No   Sexual activity: Not on file  Other Topics Concern   Not on file  Social History  Narrative   Not on file   Social Determinants of Health   Financial Resource Strain: Not on file  Food Insecurity: Not on file  Transportation Needs: Not on file  Physical Activity: Not on file  Stress: Not on file  Social Connections: Not on file  Intimate Partner Violence: Not on file      Review of Systems  Constitutional:  Negative for appetite change, chills, fatigue and unexpected weight change.  HENT:  Negative for congestion, rhinorrhea, sneezing and sore throat.   Eyes:  Negative for redness.  Respiratory: Negative.  Negative for cough, chest tightness, shortness of breath and wheezing.   Cardiovascular: Negative.  Negative for chest pain and palpitations.  Gastrointestinal:  Negative for abdominal pain, constipation, diarrhea and nausea.  Genitourinary:  Negative for dysuria and frequency.  Musculoskeletal:  Positive for arthralgias and gait problem. Negative for back pain, joint swelling and neck pain.  Skin:  Negative for rash.  Neurological:  Negative for tremors and numbness.  Hematological:  Negative for adenopathy. Does not bruise/bleed easily.  Psychiatric/Behavioral:  Negative for behavioral problems (Depression), sleep disturbance and suicidal ideas. The patient is not nervous/anxious.     Vital Signs: BP 138/78 Comment: 161/83  Pulse 70   Temp 98.7 F (37.1 C)   Resp 16   Ht 5\' 3"  (1.6 m)   Wt 152 lb (68.9 kg)   SpO2 97%   BMI 26.93 kg/m    Physical Exam Vitals reviewed.  Constitutional:      General: She is not in acute distress.    Appearance: Normal appearance. She is not ill-appearing.  HENT:     Head: Normocephalic and atraumatic.  Eyes:     Pupils: Pupils are equal, round, and reactive to light.  Cardiovascular:     Rate and Rhythm: Normal rate and regular rhythm.  Pulmonary:     Effort: Pulmonary effort is normal. No respiratory distress.  Neurological:     Mental Status: She is alert and oriented to person, place, and time.      Gait: Gait abnormal.  Psychiatric:        Mood and Affect: Mood normal.        Behavior: Behavior normal.  Assessment/Plan: 1. Gastroesophageal reflux disease without esophagitis Much improved since last office visit, continue pantoprazole as prescribed.   2. Primary osteoarthritis involving multiple joints Continue hydrocodone as needed for pain, follow up in 3 months for additional refills.  - HYDROcodone-acetaminophen (NORCO) 10-325 MG tablet; Take 0.5 tablets by mouth every 8 (eight) hours as needed for moderate pain (pain score 4-6) or severe pain (pain score 7-10).  Dispense: 30 tablet; Refill: 0 - HYDROcodone-acetaminophen (NORCO) 10-325 MG tablet; Take 0.5 tablets by mouth every 8 (eight) hours as needed for moderate pain (pain score 4-6) or severe pain (pain score 7-10).  Dispense: 30 tablet; Refill: 0 - HYDROcodone-acetaminophen (NORCO) 10-325 MG tablet; Take 0.5 tablets by mouth every 8 (eight) hours as needed for moderate pain (pain score 4-6) or severe pain (pain score 7-10).  Dispense: 30 tablet; Refill: 0  3. Parkinson's disease with dyskinesia and fluctuating manifestations (HCC) Continue following up with neurology and continue medications as prescribed.   4. Post-polio syndrome Continue hydrocodone as needed as prescribed. Follow up in 3 months for additional refills.  - HYDROcodone-acetaminophen (NORCO) 10-325 MG tablet; Take 0.5 tablets by mouth every 8 (eight) hours as needed for moderate pain (pain score 4-6) or severe pain (pain score 7-10).  Dispense: 30 tablet; Refill: 0 - HYDROcodone-acetaminophen (NORCO) 10-325 MG tablet; Take 0.5 tablets by mouth every 8 (eight) hours as needed for moderate pain (pain score 4-6) or severe pain (pain score 7-10).  Dispense: 30 tablet; Refill: 0 - HYDROcodone-acetaminophen (NORCO) 10-325 MG tablet; Take 0.5 tablets by mouth every 8 (eight) hours as needed for moderate pain (pain score 4-6) or severe pain (pain score 7-10).   Dispense: 30 tablet; Refill: 0  5. Encounter for medication review Medication list reviewed, updated and refills ordered.  - metoprolol tartrate (LOPRESSOR) 25 MG tablet; TAKE 1 TABLET (25 MG TOTAL) BY MOUTH TWICE A DAY WITH MEALS  Dispense: 180 tablet; Refill: 1 - simvastatin (ZOCOR) 20 MG tablet; TAKE 1 TABLET BY MOUTH DAILY AT 6 PM.  Dispense: 90 tablet; Refill: 1 - fluticasone (FLONASE) 50 MCG/ACT nasal spray; Place 1 spray into both nostrils at bedtime as needed for allergies or rhinitis.  Dispense: 16 g; Refill: 1 - losartan (COZAAR) 100 MG tablet; Take 1 tablet (100 mg total) by mouth daily.  Dispense: 90 tablet; Refill: 1 - potassium chloride SA (KLOR-CON M) 20 MEQ tablet; Take 1 tablet (20 mEq total) by mouth daily.  Dispense: 90 tablet; Refill: 1 - escitalopram (LEXAPRO) 20 MG tablet; Take 1 tablet (20 mg total) by mouth daily.  Dispense: 90 tablet; Refill: 1 - oxybutynin (DITROPAN-XL) 10 MG 24 hr tablet; Take 2 tablets (20 mg total) by mouth daily.  Dispense: 180 tablet; Refill: 1 - ondansetron (ZOFRAN-ODT) 4 MG disintegrating tablet; Take 1 tablet (4 mg total) by mouth every 8 (eight) hours as needed for nausea or vomiting.  Dispense: 30 tablet; Refill: 1 - pantoprazole (PROTONIX) 40 MG tablet; Take 1 tablet (40 mg total) by mouth 2 (two) times daily.  Dispense: 180 tablet; Refill: 1   General Counseling: Susan Shah verbalizes understanding of the findings of todays visit and agrees with plan of treatment. I have discussed any further diagnostic evaluation that may be needed or ordered today. We also reviewed her medications today. she has been encouraged to call the office with any questions or concerns that should arise related to todays visit.    No orders of the defined types were placed in this encounter.   Meds ordered  this encounter  Medications   metoprolol tartrate (LOPRESSOR) 25 MG tablet    Sig: TAKE 1 TABLET (25 MG TOTAL) BY MOUTH TWICE A DAY WITH MEALS    Dispense:  180  tablet    Refill:  1   simvastatin (ZOCOR) 20 MG tablet    Sig: TAKE 1 TABLET BY MOUTH DAILY AT 6 PM.    Dispense:  90 tablet    Refill:  1   HYDROcodone-acetaminophen (NORCO) 10-325 MG tablet    Sig: Take 0.5 tablets by mouth every 8 (eight) hours as needed for moderate pain (pain score 4-6) or severe pain (pain score 7-10).    Dispense:  30 tablet    Refill:  0    Refills for December   HYDROcodone-acetaminophen (NORCO) 10-325 MG tablet    Sig: Take 0.5 tablets by mouth every 8 (eight) hours as needed for moderate pain (pain score 4-6) or severe pain (pain score 7-10).    Dispense:  30 tablet    Refill:  0    Refills for november   HYDROcodone-acetaminophen (NORCO) 10-325 MG tablet    Sig: Take 0.5 tablets by mouth every 8 (eight) hours as needed for moderate pain (pain score 4-6) or severe pain (pain score 7-10).    Dispense:  30 tablet    Refill:  0    Refills for October   fluticasone (FLONASE) 50 MCG/ACT nasal spray    Sig: Place 1 spray into both nostrils at bedtime as needed for allergies or rhinitis.    Dispense:  16 g    Refill:  1   losartan (COZAAR) 100 MG tablet    Sig: Take 1 tablet (100 mg total) by mouth daily.    Dispense:  90 tablet    Refill:  1   potassium chloride SA (KLOR-CON M) 20 MEQ tablet    Sig: Take 1 tablet (20 mEq total) by mouth daily.    Dispense:  90 tablet    Refill:  1   escitalopram (LEXAPRO) 20 MG tablet    Sig: Take 1 tablet (20 mg total) by mouth daily.    Dispense:  90 tablet    Refill:  1   oxybutynin (DITROPAN-XL) 10 MG 24 hr tablet    Sig: Take 2 tablets (20 mg total) by mouth daily.    Dispense:  180 tablet    Refill:  1   ondansetron (ZOFRAN-ODT) 4 MG disintegrating tablet    Sig: Take 1 tablet (4 mg total) by mouth every 8 (eight) hours as needed for nausea or vomiting.    Dispense:  30 tablet    Refill:  1   pantoprazole (PROTONIX) 40 MG tablet    Sig: Take 1 tablet (40 mg total) by mouth 2 (two) times daily.    Dispense:   180 tablet    Refill:  1    Return in about 3 months (around 02/05/2023) for F/U, pain med refill, Ahlam Piscitelli PCP, and need to order routine labs . UDS at next visit   Total time spent:30 Minutes Time spent includes review of chart, medications, test results, and follow up plan with the patient.   [place] Controlled Substance Database was reviewed by me.  This patient was seen by Sallyanne Kuster, FNP-C in collaboration with Dr. Beverely Risen as a part of collaborative care agreement.   Myleka Moncure R. Tedd Sias, MSN, FNP-C Internal medicine

## 2022-11-18 DIAGNOSIS — M47816 Spondylosis without myelopathy or radiculopathy, lumbar region: Secondary | ICD-10-CM | POA: Diagnosis not present

## 2022-11-26 DIAGNOSIS — M5416 Radiculopathy, lumbar region: Secondary | ICD-10-CM | POA: Diagnosis not present

## 2022-12-30 ENCOUNTER — Encounter: Payer: Self-pay | Admitting: Nurse Practitioner

## 2022-12-30 DIAGNOSIS — R441 Visual hallucinations: Secondary | ICD-10-CM | POA: Diagnosis not present

## 2022-12-30 DIAGNOSIS — G20B1 Parkinson's disease with dyskinesia, without mention of fluctuations: Secondary | ICD-10-CM | POA: Diagnosis not present

## 2022-12-30 DIAGNOSIS — G14 Postpolio syndrome: Secondary | ICD-10-CM | POA: Diagnosis not present

## 2022-12-30 DIAGNOSIS — M47816 Spondylosis without myelopathy or radiculopathy, lumbar region: Secondary | ICD-10-CM | POA: Diagnosis not present

## 2022-12-30 DIAGNOSIS — Z8659 Personal history of other mental and behavioral disorders: Secondary | ICD-10-CM | POA: Diagnosis not present

## 2023-01-28 DIAGNOSIS — R41841 Cognitive communication deficit: Secondary | ICD-10-CM | POA: Diagnosis not present

## 2023-02-06 ENCOUNTER — Ambulatory Visit: Payer: Medicare Other | Admitting: Nurse Practitioner

## 2023-02-07 DIAGNOSIS — R41841 Cognitive communication deficit: Secondary | ICD-10-CM | POA: Diagnosis not present

## 2023-02-10 DIAGNOSIS — H524 Presbyopia: Secondary | ICD-10-CM | POA: Diagnosis not present

## 2023-02-13 ENCOUNTER — Telehealth: Payer: Self-pay

## 2023-02-13 DIAGNOSIS — M47816 Spondylosis without myelopathy or radiculopathy, lumbar region: Secondary | ICD-10-CM | POA: Diagnosis not present

## 2023-02-13 DIAGNOSIS — M150 Primary generalized (osteo)arthritis: Secondary | ICD-10-CM

## 2023-02-13 DIAGNOSIS — M5416 Radiculopathy, lumbar region: Secondary | ICD-10-CM | POA: Diagnosis not present

## 2023-02-13 DIAGNOSIS — G14 Postpolio syndrome: Secondary | ICD-10-CM

## 2023-02-14 DIAGNOSIS — R41841 Cognitive communication deficit: Secondary | ICD-10-CM | POA: Diagnosis not present

## 2023-02-17 ENCOUNTER — Telehealth: Payer: Self-pay

## 2023-02-18 ENCOUNTER — Other Ambulatory Visit: Payer: Self-pay | Admitting: Nurse Practitioner

## 2023-02-18 DIAGNOSIS — Z79899 Other long term (current) drug therapy: Secondary | ICD-10-CM

## 2023-02-19 MED ORDER — HYDROCODONE-ACETAMINOPHEN 10-325 MG PO TABS
0.5000 | ORAL_TABLET | Freq: Three times a day (TID) | ORAL | 0 refills | Status: DC | PRN
Start: 2023-02-19 — End: 2023-03-25

## 2023-02-19 NOTE — Telephone Encounter (Signed)
Pt advised  that we sent med please keep appt we are not able to send refills again need appt

## 2023-02-24 ENCOUNTER — Other Ambulatory Visit: Payer: Self-pay | Admitting: Nurse Practitioner

## 2023-02-24 DIAGNOSIS — K219 Gastro-esophageal reflux disease without esophagitis: Secondary | ICD-10-CM

## 2023-02-27 ENCOUNTER — Ambulatory Visit: Payer: Medicare Other | Admitting: Nurse Practitioner

## 2023-02-28 NOTE — Telephone Encounter (Signed)
 Done

## 2023-03-03 ENCOUNTER — Other Ambulatory Visit: Payer: Self-pay | Admitting: Nurse Practitioner

## 2023-03-03 ENCOUNTER — Telehealth: Payer: Self-pay | Admitting: Nurse Practitioner

## 2023-03-03 DIAGNOSIS — K219 Gastro-esophageal reflux disease without esophagitis: Secondary | ICD-10-CM

## 2023-03-03 NOTE — Telephone Encounter (Signed)
 Patient's husband called requesting famotidine  refills. I told him rx was sent to [facility] 02/24/23. He will call pharmacy-Toni

## 2023-03-06 ENCOUNTER — Other Ambulatory Visit: Payer: Self-pay

## 2023-03-06 ENCOUNTER — Ambulatory Visit: Payer: Medicare Other | Admitting: Nurse Practitioner

## 2023-03-07 DIAGNOSIS — R41841 Cognitive communication deficit: Secondary | ICD-10-CM | POA: Diagnosis not present

## 2023-03-08 ENCOUNTER — Telehealth: Payer: Self-pay | Admitting: Nurse Practitioner

## 2023-03-08 NOTE — Telephone Encounter (Signed)
Patient lvm 03/07/23 @ 2:36 to r/s cancelled wellness visit. I will call patient on Monday-Toni

## 2023-03-10 ENCOUNTER — Telehealth: Payer: Self-pay | Admitting: Nurse Practitioner

## 2023-03-10 NOTE — Telephone Encounter (Signed)
Lvm to reschedule cancelled wellness visit-Toni

## 2023-03-11 ENCOUNTER — Other Ambulatory Visit: Payer: Self-pay | Admitting: Nurse Practitioner

## 2023-03-11 DIAGNOSIS — Z79899 Other long term (current) drug therapy: Secondary | ICD-10-CM

## 2023-03-17 DIAGNOSIS — K080 Exfoliation of teeth due to systemic causes: Secondary | ICD-10-CM | POA: Diagnosis not present

## 2023-03-18 ENCOUNTER — Telehealth: Payer: Self-pay | Admitting: Nurse Practitioner

## 2023-03-18 NOTE — Telephone Encounter (Signed)
 Left vm and sent mychart message to confirm 03/25/23 appointment-Toni

## 2023-03-25 ENCOUNTER — Encounter: Payer: Self-pay | Admitting: Nurse Practitioner

## 2023-03-25 ENCOUNTER — Ambulatory Visit (INDEPENDENT_AMBULATORY_CARE_PROVIDER_SITE_OTHER): Payer: Medicare Other | Admitting: Nurse Practitioner

## 2023-03-25 VITALS — BP 125/85 | HR 62 | Temp 98.3°F | Resp 16 | Ht 63.0 in | Wt 155.6 lb

## 2023-03-25 DIAGNOSIS — M150 Primary generalized (osteo)arthritis: Secondary | ICD-10-CM

## 2023-03-25 DIAGNOSIS — N3946 Mixed incontinence: Secondary | ICD-10-CM

## 2023-03-25 DIAGNOSIS — G14 Postpolio syndrome: Secondary | ICD-10-CM | POA: Diagnosis not present

## 2023-03-25 DIAGNOSIS — E876 Hypokalemia: Secondary | ICD-10-CM | POA: Diagnosis not present

## 2023-03-25 DIAGNOSIS — Z0000 Encounter for general adult medical examination without abnormal findings: Secondary | ICD-10-CM | POA: Diagnosis not present

## 2023-03-25 DIAGNOSIS — Z79899 Other long term (current) drug therapy: Secondary | ICD-10-CM

## 2023-03-25 DIAGNOSIS — E782 Mixed hyperlipidemia: Secondary | ICD-10-CM

## 2023-03-25 DIAGNOSIS — F331 Major depressive disorder, recurrent, moderate: Secondary | ICD-10-CM

## 2023-03-25 DIAGNOSIS — I10 Essential (primary) hypertension: Secondary | ICD-10-CM | POA: Diagnosis not present

## 2023-03-25 DIAGNOSIS — Z760 Encounter for issue of repeat prescription: Secondary | ICD-10-CM

## 2023-03-25 MED ORDER — METOPROLOL TARTRATE 25 MG PO TABS
ORAL_TABLET | ORAL | 1 refills | Status: DC
Start: 2023-03-25 — End: 2023-06-23

## 2023-03-25 MED ORDER — HYDROCODONE-ACETAMINOPHEN 10-325 MG PO TABS
0.5000 | ORAL_TABLET | Freq: Three times a day (TID) | ORAL | 0 refills | Status: DC | PRN
Start: 2023-05-20 — End: 2023-06-23

## 2023-03-25 MED ORDER — POTASSIUM CHLORIDE CRYS ER 20 MEQ PO TBCR
20.0000 meq | EXTENDED_RELEASE_TABLET | Freq: Every day | ORAL | 1 refills | Status: DC
Start: 2023-03-25 — End: 2023-06-23

## 2023-03-25 MED ORDER — AMLODIPINE BESYLATE 2.5 MG PO TABS: ORAL_TABLET | ORAL | 2 refills | Status: DC

## 2023-03-25 MED ORDER — HYDROCODONE-ACETAMINOPHEN 10-325 MG PO TABS
0.5000 | ORAL_TABLET | Freq: Three times a day (TID) | ORAL | 0 refills | Status: DC | PRN
Start: 2023-03-25 — End: 2023-06-23

## 2023-03-25 MED ORDER — SIMVASTATIN 20 MG PO TABS: ORAL_TABLET | ORAL | 1 refills | Status: DC

## 2023-03-25 MED ORDER — LOSARTAN POTASSIUM 100 MG PO TABS: 100.0000 mg | ORAL_TABLET | Freq: Every day | ORAL | 1 refills | Status: DC

## 2023-03-25 MED ORDER — HYDROCODONE-ACETAMINOPHEN 10-325 MG PO TABS
0.5000 | ORAL_TABLET | Freq: Three times a day (TID) | ORAL | 0 refills | Status: DC | PRN
Start: 2023-03-25 — End: 2023-03-25

## 2023-03-25 MED ORDER — BUPROPION HCL ER (XL) 150 MG PO TB24: 150.0000 mg | ORAL_TABLET | Freq: Every day | ORAL | 1 refills | Status: DC

## 2023-03-25 MED ORDER — OXYBUTYNIN CHLORIDE ER 10 MG PO TB24: 20.0000 mg | ORAL_TABLET | Freq: Every day | ORAL | 1 refills | Status: DC

## 2023-03-25 MED ORDER — HYDROCODONE-ACETAMINOPHEN 10-325 MG PO TABS
0.5000 | ORAL_TABLET | Freq: Three times a day (TID) | ORAL | 0 refills | Status: DC | PRN
Start: 2023-04-22 — End: 2023-06-23

## 2023-03-25 NOTE — Progress Notes (Signed)
 [facility] [address] [place], [place] [address]  Internal MEDICINE  Office Visit Note  Patient Name: Susan Shah  [id number]  [id number]  Date of Service: 03/25/2023  Chief Complaint  Patient presents with   Depression   Hyperlipidemia   Gastroesophageal Reflux   Hypertension   Medicare Wellness    HPI Artasia presents for an annual well visit and physical exam.  Well-appearing 78 y.o. female with hypertension, OSA, hiatal hernia, GERD, post-polio syndrome, parkinsons disease, and depression  Routine CRC screening:last colonoscopy was done in 2019. Discontinued at this time Routine mammogram: done in April last year  DEXA scan: done in 2017 Labs: due for routine labs  New or worsening pain: chronic pain but no new pains  Other concerns: none      03/25/2023   10:42 AM 02/21/2022   11:15 AM 02/20/2021   11:11 AM  MMSE - Mini Mental State Exam  Orientation to time 5 5 5   Orientation to Place 5 5 5   Registration 3 3 3   Attention/ Calculation 5 5 5   Recall 3 3 3   Language- name 2 objects 2 2 2   Language- repeat 1 1 1   Language- follow 3 step command 3 3 3   Language- read & follow direction 1 1 1   Write a sentence 1 1 1   Copy design 1 1 1   Total score 30 30 30     Functional Status Survey: Is the patient deaf or have difficulty hearing?: Yes Does the patient have difficulty seeing, even when wearing glasses/contacts?: Yes Does the patient have difficulty concentrating, remembering, or making decisions?: Yes Does the patient have difficulty walking or climbing stairs?: Yes Does the patient have difficulty dressing or bathing?: No Does the patient have difficulty doing errands alone such as visiting a doctor's office or shopping?: No     06/26/2021    3:44 PM 09/25/2021    3:34 PM 12/07/2021   11:17 AM 02/21/2022   11:11 AM 03/25/2023   10:41 AM  Fall Risk  Falls in the past year? 1 1 1 1  0  Was there an injury with Fall? 0 0 0 0 0  Fall Risk Category  Calculator 2 2 1 1  0  Fall Risk Category (Retired) Moderate Moderate Low    (RETIRED) Patient Fall Risk Level Moderate fall risk High fall risk     Patient at Risk for Falls Due to Impaired balance/gait;Impaired mobility    No Fall Risks  Fall risk Follow up Falls evaluation completed   Falls evaluation completed Falls evaluation completed       02/21/2022   11:11 AM  Depression screen PHQ 2/9  Decreased Interest 3  Down, Depressed, Hopeless 3  PHQ - 2 Score 6  Altered sleeping 0  Tired, decreased energy 3  Change in appetite 0  Feeling bad or failure about yourself  2  Trouble concentrating 3  Moving slowly or fidgety/restless 0  Suicidal thoughts 0  PHQ-9 Score 14  Difficult doing work/chores Somewhat difficult        Current Medication: Outpatient Encounter Medications as of 03/25/2023  Medication Sig   acetaminophen (TYLENOL) 500 MG tablet Take 1,000 mg by mouth every 6 (six) hours as needed for moderate pain or headache.   Carboxymethylcellul-Glycerin (LUBRICATING EYE DROPS OP) Place 1 drop into both eyes daily as needed (dry eyes).   cholecalciferol (VITAMIN D3) 25 MCG (1000 UNIT) tablet Take 1,000 Units by mouth daily.   escitalopram (LEXAPRO) 20 MG tablet  TAKE 1 TABLET BY MOUTH EVERY DAY   famotidine (PEPCID) 40 MG tablet TAKE 1 TABLET(40 MG) BY MOUTH TWICE DAILY   fluticasone (FLONASE) 50 MCG/ACT nasal spray Place 1 spray into both nostrils at bedtime as needed for allergies or rhinitis.   loratadine (QC LORATADINE ALLERGY RELIEF) 10 MG tablet Take 1 tablet (10 mg total) by mouth daily.   ondansetron (ZOFRAN-ODT) 4 MG disintegrating tablet Take 1 tablet (4 mg total) by mouth every 8 (eight) hours as needed for nausea or vomiting.   pantoprazole (PROTONIX) 40 MG tablet TAKE 1 TABLET BY MOUTH TWICE DAILY   rOPINIRole (REQUIP) 0.25 MG tablet Take by mouth.   vitamin B-12 (CYANOCOBALAMIN) 1000 MCG tablet Take 1,000 mcg by mouth daily.   [DISCONTINUED] amLODipine (NORVASC)  2.5 MG tablet TAKE 1 TABLET BY MOUTH EVERY DAY- STOP LISINOPRIL   [DISCONTINUED] buPROPion (WELLBUTRIN XL) 150 MG 24 hr tablet Take 1 tablet (150 mg total) by mouth daily.   [DISCONTINUED] HYDROcodone-acetaminophen (NORCO) 10-325 MG tablet Take 0.5 tablets by mouth every 8 (eight) hours as needed for moderate pain (pain score 4-6) or severe pain (pain score 7-10).   [DISCONTINUED] HYDROcodone-acetaminophen (NORCO) 10-325 MG tablet Take 0.5 tablets by mouth every 8 (eight) hours as needed for moderate pain (pain score 4-6) or severe pain (pain score 7-10).   [DISCONTINUED] HYDROcodone-acetaminophen (NORCO) 10-325 MG tablet Take 0.5 tablets by mouth every 8 (eight) hours as needed for moderate pain (pain score 4-6) or severe pain (pain score 7-10).   [DISCONTINUED] losartan (COZAAR) 100 MG tablet Take 1 tablet (100 mg total) by mouth daily.   [DISCONTINUED] metoprolol tartrate (LOPRESSOR) 25 MG tablet TAKE 1 TABLET (25 MG TOTAL) BY MOUTH TWICE A DAY WITH MEALS   [DISCONTINUED] oxybutynin (DITROPAN-XL) 10 MG 24 hr tablet Take 2 tablets (20 mg total) by mouth daily.   [DISCONTINUED] potassium chloride SA (KLOR-CON M) 20 MEQ tablet Take 1 tablet (20 mEq total) by mouth daily.   [DISCONTINUED] simvastatin (ZOCOR) 20 MG tablet TAKE 1 TABLET BY MOUTH DAILY AT 6 PM.   amLODipine (NORVASC) 2.5 MG tablet TAKE 1 TABLET BY MOUTH EVERY DAY- STOP LISINOPRIL   buPROPion (WELLBUTRIN XL) 150 MG 24 hr tablet Take 1 tablet (150 mg total) by mouth daily.   Carbidopa-Levodopa ER (SINEMET CR) 25-100 MG tablet controlled release Take 1 tablet by mouth 3 (three) times daily.   HYDROcodone-acetaminophen (NORCO) 10-325 MG tablet Take 0.5 tablets by mouth every 8 (eight) hours as needed for moderate pain (pain score 4-6) or severe pain (pain score 7-10).   HYDROcodone-acetaminophen (NORCO) 10-325 MG tablet Take 0.5 tablets by mouth every 8 (eight) hours as needed for moderate pain (pain score 4-6) or severe pain (pain score  7-10).   [START ON 05/20/2023] HYDROcodone-acetaminophen (NORCO) 10-325 MG tablet Take 0.5 tablets by mouth every 8 (eight) hours as needed for moderate pain (pain score 4-6) or severe pain (pain score 7-10).   losartan (COZAAR) 100 MG tablet Take 1 tablet (100 mg total) by mouth daily.   metoprolol tartrate (LOPRESSOR) 25 MG tablet TAKE 1 TABLET (25 MG TOTAL) BY MOUTH TWICE A DAY WITH MEALS   oxybutynin (DITROPAN-XL) 10 MG 24 hr tablet Take 2 tablets (20 mg total) by mouth daily.   potassium chloride SA (KLOR-CON M) 20 MEQ tablet Take 1 tablet (20 mEq total) by mouth daily.   simvastatin (ZOCOR) 20 MG tablet TAKE 1 TABLET BY MOUTH DAILY AT 6 PM.   [DISCONTINUED] HYDROcodone-acetaminophen (NORCO) 10-325 MG tablet  Take 0.5 tablets by mouth every 8 (eight) hours as needed for moderate pain (pain score 4-6) or severe pain (pain score 7-10).   No facility-administered encounter medications on file as of 03/25/2023.    Surgical History: Past Surgical History:  Procedure Laterality Date   31 HOUR PH STUDY N/A 04/24/2016   Procedure: 24 HOUR PH STUDY;  Surgeon: Marnee Sink, MD;  Location: [facility] ENDOSCOPY;  Service: Endoscopy;  Laterality: N/A;   ABDOMINAL HYSTERECTOMY     Total   APPENDECTOMY     CARPOMETACARPAL ([facility]) FUSION OF THUMB Left 05/16/2016   Procedure: CARPOMETACARPAL [facility]) FUSION OF THUMB;  Surgeon: Molli Angelucci, MD;  Location: [facility] ORS;  Service: Orthopedics;  Laterality: Left;   CARPOMETACARPAL (CMC) FUSION OF THUMB Left 03/30/2019   Procedure: LEFT THUMB SUSPENSION PLASTY;  Surgeon: Molli Angelucci, MD;  Location: [facility] ORS;  Service: Orthopedics;  Laterality: Left;   CATARACT EXTRACTION Bilateral 12/2012   CHOLECYSTECTOMY     COLONOSCOPY  2003   ESOPHAGEAL MANOMETRY N/A 04/24/2016   Procedure: ESOPHAGEAL MANOMETRY (EM);  Surgeon: Marnee Sink, MD;  Location: [facility] ENDOSCOPY;  Service: Endoscopy;  Laterality: N/A;   ESOPHAGOGASTRODUODENOSCOPY  08/2011   FOOT SURGERY     HALLUX VALGUS CORRECTION      HIP SURGERY Right 1950   tendon release r/t polio   KNEE ARTHROSCOPY Left 06/23/2008   RETINAL DETACHMENT SURGERY Right 03/2014   ROTATOR CUFF REPAIR Bilateral    URINARY SURGERY  2014   [place]     Medical History: Past Medical History:  Diagnosis Date   Anxiety    Asthma 2015   mild, seasonal allergy triggered.   Cancer (HCC) 2013   skin cancer  on left hand   Depression    Esophageal dysmotility    GERD (gastroesophageal reflux disease) 11/05/2012   History of hiatal hernia    Hyperlipidemia    Hypertension    Incontinence of urine    Lung nodule    OA (osteoarthritis)    Obesity    Parkinson's disease (HCC)    PONV (postoperative nausea and vomiting)    Post-polio syndrome    contracted at 86 months old   Pulmonary nodule 11/05/2012   RML   Shingles 2009   Sleep apnea     Family History: Family History  Problem Relation Age of Onset   Heart disease Mother    Heart disease Father    Heart attack Sister    Heart disease Brother    Heart attack Brother    Breast cancer Maternal Aunt    Stroke Maternal Grandmother    Heart disease Maternal Grandfather     Social History   Socioeconomic History   Marital status: Married    Spouse name: Not on file   Number of children: Not on file   Years of education: Not on file   Highest education level: Not on file  Occupational History   Not on file  Tobacco Use   Smoking status: Former    Current packs/day: 0.00    Types: Cigarettes    Quit date: 05/09/1968    Years since quitting: 55.0   Smokeless tobacco: Never  Vaping Use   Vaping status: Never Used  Substance and Sexual Activity   Alcohol use: No   Drug use: No   Sexual activity: Not on file  Other Topics Concern   Not on file  Social History Narrative   Not on file   Social Drivers of Health   Financial  Resource Strain: Not on file  Food Insecurity: Not on file  Transportation Needs: Not on file  Physical Activity: Not on file  Stress:  Not on file  Social Connections: Not on file  Intimate Partner Violence: Not on file      Review of Systems  Constitutional:  Positive for fatigue. Negative for activity change, appetite change, chills, fever and unexpected weight change.  HENT: Negative.  Negative for congestion, ear pain, rhinorrhea, sore throat and trouble swallowing.   Eyes: Negative.   Respiratory: Negative.  Negative for cough, chest tightness, shortness of breath and wheezing.   Cardiovascular: Negative.  Negative for chest pain and palpitations.  Gastrointestinal:  Positive for nausea. Negative for abdominal pain, blood in stool, constipation, diarrhea and vomiting.  Endocrine: Negative.   Genitourinary: Negative.  Negative for difficulty urinating, dysuria, frequency, hematuria and urgency.  Musculoskeletal:  Positive for arthralgias and gait problem. Negative for back pain, joint swelling, myalgias and neck pain.  Skin: Negative.  Negative for rash and wound.  Allergic/Immunologic: Negative.  Negative for immunocompromised state.  Neurological:  Positive for weakness. Negative for dizziness, seizures, numbness and headaches.  Hematological: Negative.   Psychiatric/Behavioral:  Positive for behavioral problems. Negative for self-injury, sleep disturbance and suicidal ideas. The patient is nervous/anxious.     Vital Signs: BP 125/85   Pulse 62   Temp 98.3 F (36.8 C)   Resp 16   Ht 5\' 3"  (1.6 m)   Wt 155 lb 9.6 oz (70.6 kg)   SpO2 98%   BMI 27.56 kg/m    Physical Exam Vitals reviewed.  Constitutional:      General: She is awake. She is not in acute distress.    Appearance: Normal appearance. She is well-developed, well-groomed and normal weight. She is not ill-appearing or diaphoretic.  HENT:     Head: Normocephalic and atraumatic.     Mouth/Throat:     Lips: Pink.     Pharynx: Uvula midline.  Eyes:     General: Lids are normal. Vision grossly intact. Gaze aligned appropriately.      Extraocular Movements: Extraocular movements intact.     Conjunctiva/sclera: Conjunctivae normal.     Pupils: Pupils are equal, round, and reactive to light.     Funduscopic exam:    Right eye: Red reflex present.        Left eye: Red reflex present. Neck:     Thyroid: No thyromegaly.     Vascular: No JVD.     Trachea: Trachea and phonation normal. No tracheal deviation.  Cardiovascular:     Rate and Rhythm: Normal rate and regular rhythm.     Pulses: Normal pulses.     Heart sounds: Normal heart sounds, S1 normal and S2 normal. No murmur heard.    No friction rub. No gallop.  Pulmonary:     Effort: Pulmonary effort is normal. No accessory muscle usage or respiratory distress.     Breath sounds: Normal breath sounds and air entry. No stridor. No wheezing or rales.  Chest:     Chest wall: No tenderness.  Breasts:    Right: Normal. No swelling, bleeding, inverted nipple, mass, nipple discharge, skin change or tenderness.     Left: Normal. No swelling, bleeding, inverted nipple, mass, nipple discharge, skin change or tenderness.  Musculoskeletal:     Cervical back: Normal range of motion and neck supple.  Lymphadenopathy:     Cervical: No cervical adenopathy.     Upper Body:  Right upper body: No supraclavicular, axillary or pectoral adenopathy.     Left upper body: No supraclavicular, axillary or pectoral adenopathy.  Skin:    General: Skin is warm and dry.     Capillary Refill: Capillary refill takes less than 2 seconds.  Neurological:     Mental Status: She is alert and oriented to person, place, and time.     Cranial Nerves: No cranial nerve deficit.     Motor: No abnormal muscle tone.     Coordination: Coordination normal.     Gait: Gait normal.     Deep Tendon Reflexes: Reflexes are normal and symmetric.  Psychiatric:        Mood and Affect: Mood normal.        Behavior: Behavior normal. Behavior is cooperative.        Assessment/Plan: 1. Encounter for  subsequent annual wellness visit (AWV) in Medicare patient (Primary) Age-appropriate preventive screenings and vaccinations discussed. Routine labs for health maintenance will be ordered. PHM updated.    2. Essential hypertension Stable, continue losartan, metoprolol and amlodipine as prescribed.  - losartan (COZAAR) 100 MG tablet; Take 1 tablet (100 mg total) by mouth daily.  Dispense: 90 tablet; Refill: 1 - metoprolol tartrate (LOPRESSOR) 25 MG tablet; TAKE 1 TABLET (25 MG TOTAL) BY MOUTH TWICE A DAY WITH MEALS  Dispense: 180 tablet; Refill: 1 - amLODipine (NORVASC) 2.5 MG tablet; TAKE 1 TABLET BY MOUTH EVERY DAY- STOP LISINOPRIL  Dispense: 90 tablet; Refill: 2  3. Mixed hyperlipidemia Continue simvastatin as prescribed.  - simvastatin (ZOCOR) 20 MG tablet; TAKE 1 TABLET BY MOUTH DAILY AT 6 PM.  Dispense: 90 tablet; Refill: 1  4. Hypokalemia Continue potassium supplement as prescribed.  - potassium chloride SA (KLOR-CON M) 20 MEQ tablet; Take 1 tablet (20 mEq total) by mouth daily.  Dispense: 90 tablet; Refill: 1  5. Post-polio syndrome Continue prn hydrocodone as prescribed. Follow up in 3 months for additional refills.  - HYDROcodone-acetaminophen (NORCO) 10-325 MG tablet; Take 0.5 tablets by mouth every 8 (eight) hours as needed for moderate pain (pain score 4-6) or severe pain (pain score 7-10).  Dispense: 30 tablet; Refill: 0 - HYDROcodone-acetaminophen (NORCO) 10-325 MG tablet; Take 0.5 tablets by mouth every 8 (eight) hours as needed for moderate pain (pain score 4-6) or severe pain (pain score 7-10).  Dispense: 30 tablet; Refill: 0 - HYDROcodone-acetaminophen (NORCO) 10-325 MG tablet; Take 0.5 tablets by mouth every 8 (eight) hours as needed for moderate pain (pain score 4-6) or severe pain (pain score 7-10).  Dispense: 30 tablet; Refill: 0  6. Primary osteoarthritis involving multiple joints Continue prn hydrocodone as prescribed. Follow up in 3 months for additional refills.  -  HYDROcodone-acetaminophen (NORCO) 10-325 MG tablet; Take 0.5 tablets by mouth every 8 (eight) hours as needed for moderate pain (pain score 4-6) or severe pain (pain score 7-10).  Dispense: 30 tablet; Refill: 0 - HYDROcodone-acetaminophen (NORCO) 10-325 MG tablet; Take 0.5 tablets by mouth every 8 (eight) hours as needed for moderate pain (pain score 4-6) or severe pain (pain score 7-10).  Dispense: 30 tablet; Refill: 0 - HYDROcodone-acetaminophen (NORCO) 10-325 MG tablet; Take 0.5 tablets by mouth every 8 (eight) hours as needed for moderate pain (pain score 4-6) or severe pain (pain score 7-10).  Dispense: 30 tablet; Refill: 0  7. Mixed stress and urge urinary incontinence Continue oxybutynin as prescribed  - oxybutynin (DITROPAN-XL) 10 MG 24 hr tablet; Take 2 tablets (20 mg total) by mouth daily.  Dispense: 180 tablet; Refill: 1  8. Moderate episode of recurrent major depressive disorder (HCC) Continue bupropion as prescribed  - buPROPion (WELLBUTRIN XL) 150 MG 24 hr tablet; Take 1 tablet (150 mg total) by mouth daily.  Dispense: 90 tablet; Refill: 1      General Counseling: Charika verbalizes understanding of the findings of todays visit and agrees with plan of treatment. I have discussed any further diagnostic evaluation that may be needed or ordered today. We also reviewed her medications today. she has been encouraged to call the office with any questions or concerns that should arise related to todays visit.    No orders of the defined types were placed in this encounter.   Meds ordered this encounter  Medications   DISCONTD: HYDROcodone-acetaminophen (NORCO) 10-325 MG tablet    Sig: Take 0.5 tablets by mouth every 8 (eight) hours as needed for moderate pain (pain score 4-6) or severe pain (pain score 7-10).    Dispense:  30 tablet    Refill:  0    Fill for march 4   HYDROcodone-acetaminophen (NORCO) 10-325 MG tablet    Sig: Take 0.5 tablets by mouth every 8 (eight) hours as  needed for moderate pain (pain score 4-6) or severe pain (pain score 7-10).    Dispense:  30 tablet    Refill:  0    Fill for April 1   HYDROcodone-acetaminophen (NORCO) 10-325 MG tablet    Sig: Take 0.5 tablets by mouth every 8 (eight) hours as needed for moderate pain (pain score 4-6) or severe pain (pain score 7-10).    Dispense:  30 tablet    Refill:  0    Refills for november   HYDROcodone-acetaminophen (NORCO) 10-325 MG tablet    Sig: Take 0.5 tablets by mouth every 8 (eight) hours as needed for moderate pain (pain score 4-6) or severe pain (pain score 7-10).    Dispense:  30 tablet    Refill:  0    Fill for April 29   losartan (COZAAR) 100 MG tablet    Sig: Take 1 tablet (100 mg total) by mouth daily.    Dispense:  90 tablet    Refill:  1    For future refills   metoprolol tartrate (LOPRESSOR) 25 MG tablet    Sig: TAKE 1 TABLET (25 MG TOTAL) BY MOUTH TWICE A DAY WITH MEALS    Dispense:  180 tablet    Refill:  1    For future refills   oxybutynin (DITROPAN-XL) 10 MG 24 hr tablet    Sig: Take 2 tablets (20 mg total) by mouth daily.    Dispense:  180 tablet    Refill:  1    For future refills   potassium chloride SA (KLOR-CON M) 20 MEQ tablet    Sig: Take 1 tablet (20 mEq total) by mouth daily.    Dispense:  90 tablet    Refill:  1    For future refills   simvastatin (ZOCOR) 20 MG tablet    Sig: TAKE 1 TABLET BY MOUTH DAILY AT 6 PM.    Dispense:  90 tablet    Refill:  1    For future refills   buPROPion (WELLBUTRIN XL) 150 MG 24 hr tablet    Sig: Take 1 tablet (150 mg total) by mouth daily.    Dispense:  90 tablet    Refill:  1    For future refills   amLODipine (NORVASC) 2.5 MG tablet  Sig: TAKE 1 TABLET BY MOUTH EVERY DAY- STOP LISINOPRIL    Dispense:  90 tablet    Refill:  2    For future refills    Return in about 3 months (around 06/25/2023) for F/U, pain med refill, [place] PCP.   Total time spent:30 Minutes Time spent includes review of chart,  medications, test results, and follow up plan with the patient.    Controlled Substance Database was reviewed by me.  This patient was seen by Laurence Pons, FNP-C in collaboration with Dr. Verneta Gone as a part of collaborative care agreement.  Billie Intriago R. Bobbi Burow, MSN, FNP-C Internal medicine

## 2023-04-09 ENCOUNTER — Encounter: Payer: Self-pay | Admitting: Emergency Medicine

## 2023-04-09 ENCOUNTER — Other Ambulatory Visit: Payer: Self-pay

## 2023-04-09 ENCOUNTER — Ambulatory Visit: Admission: EM | Admit: 2023-04-09 | Discharge: 2023-04-09 | Disposition: A | Discharge status: Home / Self Care

## 2023-04-09 DIAGNOSIS — R102 Pelvic and perineal pain: Secondary | ICD-10-CM

## 2023-04-09 NOTE — ED Triage Notes (Signed)
 Patient presents to [facility] for evaluation of generalized abdominal pain for a few days, with nausea, but no emesis.  States her urine has been dark yellow/orange for a few days with a terrible smell.  Denies dysuria.

## 2023-04-09 NOTE — ED Provider Notes (Signed)
 Renaldo Fiddler    CSN: [id number] Arrival date & time: 04/09/23  1824      History   Chief Complaint Chief Complaint  Patient presents with   Abdominal Pain    HPI Susan Shah is a 78 y.o. female.   Patient presents for evaluation of generalized abdominal pain, nausea without vomiting, darkening urine with odor present for 2 to 3 days.  Has not attempted treatment.  Denies fever, hematuria or dysuria.  Past Medical History:  Diagnosis Date   Anxiety    Asthma 2015   mild, seasonal allergy triggered.   Cancer (HCC) 2013   skin cancer  on left hand   Depression    Esophageal dysmotility    GERD (gastroesophageal reflux disease) 11/05/2012   History of hiatal hernia    Hyperlipidemia    Hypertension    Incontinence of urine    Lung nodule    OA (osteoarthritis)    Obesity    Parkinson's disease (HCC)    PONV (postoperative nausea and vomiting)    Post-polio syndrome    contracted at 61 months old   Pulmonary nodule 11/05/2012   RML   Shingles 2009   Sleep apnea     Patient Active Problem List   Diagnosis Date Noted   Other hypersomnia 07/04/2020   Encounter for general adult medical examination with abnormal findings 01/07/2020   Benign hypertension 01/07/2020   Parkinson disease (HCC) 01/07/2020   At high risk for falls 01/07/2020   Abnormality of gait and mobility 01/07/2020   Urinary tract infection with hematuria 10/03/2019   Non-intractable vomiting 10/03/2019   Dysuria 10/03/2019   TMJ (temporomandibular joint disorder) 07/21/2019   Generalized weakness 07/21/2019   OSA on CPAP 05/02/2019   Cough due to ACE inhibitor 07/10/2018   Other fatigue 02/16/2018   Vertigo 02/08/2018   Screening for breast cancer 12/17/2017   Flu vaccine need 10/30/2017   Need for vaccination against Streptococcus pneumoniae using pneumococcal conjugate vaccine 13 10/30/2017   Hypokalemia 08/28/2017   Absolute anemia 08/28/2017   Benign essential tremor  08/28/2017   Chest pain 08/25/2017   H/O aneurysm 07/15/2017   Monoplegia of right leg (HCC) 07/15/2017   Depression 03/23/2017   Hiatal hernia with gastroesophageal reflux 07/30/2016   Hiatal hernia 04/11/2016   Anxiety    OA (osteoarthritis)    Hyperlipidemia    Essential hypertension    Post-polio syndrome    Lung nodule    Esophageal dysmotility    Incontinence of urine    Obesity    GERD (gastroesophageal reflux disease) 11/05/2012   Pulmonary nodule 11/05/2012   Shingles 01/22/2007    Past Surgical History:  Procedure Laterality Date   24 HOUR PH STUDY N/A 04/24/2016   Procedure: 24 HOUR PH STUDY;  Surgeon: Midge Minium, MD;  Location: [facility] ENDOSCOPY;  Service: Endoscopy;  Laterality: N/A;   ABDOMINAL HYSTERECTOMY     Total   APPENDECTOMY     CARPOMETACARPAL (CMC) FUSION OF THUMB Left 05/16/2016   Procedure: CARPOMETACARPAL [facility]) FUSION OF THUMB;  Surgeon: Kennedy Bucker, MD;  Location: [facility] ORS;  Service: Orthopedics;  Laterality: Left;   CARPOMETACARPAL (CMC) FUSION OF THUMB Left 03/30/2019   Procedure: LEFT THUMB SUSPENSION PLASTY;  Surgeon: Kennedy Bucker, MD;  Location: [facility] ORS;  Service: Orthopedics;  Laterality: Left;   CATARACT EXTRACTION Bilateral 12/2012   CHOLECYSTECTOMY     COLONOSCOPY  2003   ESOPHAGEAL MANOMETRY N/A 04/24/2016   Procedure: ESOPHAGEAL MANOMETRY (EM);  Surgeon: Midge Minium, MD;  Location: [facility] ENDOSCOPY;  Service: Endoscopy;  Laterality: N/A;   ESOPHAGOGASTRODUODENOSCOPY  08/2011   FOOT SURGERY     HALLUX VALGUS CORRECTION     HIP SURGERY Right 1950   tendon release r/t polio   KNEE ARTHROSCOPY Left 06/23/2008   RETINAL DETACHMENT SURGERY Right 03/2014   ROTATOR CUFF REPAIR Bilateral    URINARY SURGERY  2014   [place]    OB History   No obstetric history on file.      Home Medications    Prior to Admission medications   Medication Sig Start Date End Date Taking? Authorizing Provider  acetaminophen (TYLENOL) 500 MG tablet Take  1,000 mg by mouth every 6 (six) hours as needed for moderate pain or headache.    [provider]  amLODipine (NORVASC) 2.5 MG tablet TAKE 1 TABLET BY MOUTH EVERY DAY- STOP LISINOPRIL 03/25/23   Sallyanne Kuster, NP  buPROPion (WELLBUTRIN XL) 150 MG 24 hr tablet Take 1 tablet (150 mg total) by mouth daily. 03/25/23   Sallyanne Kuster, NP  Carbidopa-Levodopa ER (SINEMET CR) 25-100 MG tablet controlled release Take 1 tablet by mouth 3 (three) times daily. 01/29/22 01/29/23  [provider]  Carboxymethylcellul-Glycerin (LUBRICATING EYE DROPS OP) Place 1 drop into both eyes daily as needed (dry eyes).    [provider]  cholecalciferol (VITAMIN D3) 25 MCG (1000 UNIT) tablet Take 1,000 Units by mouth daily.    [provider]  escitalopram (LEXAPRO) 20 MG tablet TAKE 1 TABLET BY MOUTH EVERY DAY 02/19/23   Sallyanne Kuster, NP  famotidine (PEPCID) 40 MG tablet TAKE 1 TABLET(40 MG) BY MOUTH TWICE DAILY 03/03/23   Sallyanne Kuster, NP  fluticasone (FLONASE) 50 MCG/ACT nasal spray Place 1 spray into both nostrils at bedtime as needed for allergies or rhinitis. 11/12/22   Sallyanne Kuster, NP  HYDROcodone-acetaminophen (NORCO) 10-325 MG tablet Take 0.5 tablets by mouth every 8 (eight) hours as needed for moderate pain (pain score 4-6) or severe pain (pain score 7-10). 04/22/23   Sallyanne Kuster, NP  HYDROcodone-acetaminophen (NORCO) 10-325 MG tablet Take 0.5 tablets by mouth every 8 (eight) hours as needed for moderate pain (pain score 4-6) or severe pain (pain score 7-10). 03/25/23   Sallyanne Kuster, NP  HYDROcodone-acetaminophen (NORCO) 10-325 MG tablet Take 0.5 tablets by mouth every 8 (eight) hours as needed for moderate pain (pain score 4-6) or severe pain (pain score 7-10). 05/20/23   Sallyanne Kuster, NP  loratadine (QC LORATADINE ALLERGY RELIEF) 10 MG tablet Take 1 tablet (10 mg total) by mouth daily. 01/08/19   Carlean Jews, NP  losartan (COZAAR) 100 MG tablet Take 1  tablet (100 mg total) by mouth daily. 03/25/23   Sallyanne Kuster, NP  metoprolol tartrate (LOPRESSOR) 25 MG tablet TAKE 1 TABLET (25 MG TOTAL) BY MOUTH TWICE A DAY WITH MEALS 03/25/23   Abernathy, Arlyss Repress, NP  ondansetron (ZOFRAN-ODT) 4 MG disintegrating tablet Take 1 tablet (4 mg total) by mouth every 8 (eight) hours as needed for nausea or vomiting. 11/12/22   Sallyanne Kuster, NP  oxybutynin (DITROPAN-XL) 10 MG 24 hr tablet Take 2 tablets (20 mg total) by mouth daily. 03/25/23   Sallyanne Kuster, NP  pantoprazole (PROTONIX) 40 MG tablet TAKE 1 TABLET BY MOUTH TWICE DAILY 03/11/23   Lyndon Code, MD  potassium chloride SA (KLOR-CON M) 20 MEQ tablet Take 1 tablet (20 mEq total) by mouth daily. 03/25/23   Sallyanne Kuster, NP  rOPINIRole (REQUIP) 0.25 MG  tablet Take by mouth. 02/21/21   [provider]  simvastatin (ZOCOR) 20 MG tablet TAKE 1 TABLET BY MOUTH DAILY AT 6 PM. 03/25/23   Sallyanne Kuster, NP  vitamin B-12 (CYANOCOBALAMIN) 1000 MCG tablet Take 1,000 mcg by mouth daily.    [provider]    Family History Family History  Problem Relation Age of Onset   Heart disease Mother    Heart disease Father    Heart attack Sister    Heart disease Brother    Heart attack Brother    Breast cancer Maternal Aunt    Stroke Maternal Grandmother    Heart disease Maternal Grandfather     Social History Social History   Tobacco Use   Smoking status: Former    Current packs/day: 0.00    Types: Cigarettes    Quit date: 05/09/1968    Years since quitting: 54.9   Smokeless tobacco: Never  Vaping Use   Vaping status: Never Used  Substance Use Topics   Alcohol use: No   Drug use: No     Allergies   Soma [carisoprodol], Iodinated contrast media, Iodine, and Macrobid [nitrofurantoin]   Review of Systems Review of Systems   Physical Exam Triage Vital Signs ED Triage Vitals  Encounter Vitals Group     BP 04/09/23 1837 (!) 166/82     Systolic BP Percentile --       Diastolic BP Percentile --      Pulse Rate 04/09/23 1837 76     Resp 04/09/23 1837 18     Temp 04/09/23 1837 99.1 F (37.3 C)     Temp Source 04/09/23 1837 Oral     SpO2 04/09/23 1837 95 %     Weight --      Height --      Head Circumference --      Peak Flow --      Pain Score 04/09/23 1838 4     Pain Loc --      Pain Education --      Exclude from Growth Chart --    No data found.  Updated Vital Signs BP (!) 166/82 (BP Location: Left Arm)   Pulse 76   Temp 99.1 F (37.3 C) (Oral)   Resp 18   SpO2 95%   Visual Acuity Right Eye Distance:   Left Eye Distance:   Bilateral Distance:    Right Eye Near:   Left Eye Near:    Bilateral Near:     Physical Exam Constitutional:      Appearance: She is well-developed.  Eyes:     Extraocular Movements: Extraocular movements intact.  Pulmonary:     Effort: Pulmonary effort is normal.  Abdominal:     Tenderness: There is abdominal tenderness in the suprapubic area. There is no right CVA tenderness or left CVA tenderness.  Neurological:     Mental Status: She is alert and oriented to person, place, and time. Mental status is at baseline.      UC Treatments / Results  Labs (all labs ordered are listed, but only abnormal results are displayed) Labs Reviewed  POCT URINALYSIS DIP (MANUAL ENTRY)    EKG   Radiology No results found.  Procedures Procedures (including critical care time)  Medications Ordered in UC Medications - No data to display  Initial Impression / Assessment and Plan / UC Course  I have reviewed the triage vital signs and the nursing notes.  Pertinent labs & imaging results that were  available during my care of the patient were reviewed by me and considered in my medical decision making (see chart for details).  Suprapubic pain  Unable to provide urine sample, will drop off to clinic at that time we will prophylactically treat with antibiotics, no signs of sepsis or kidney involvement at this  time, discussed this with patient, recommended additional supportive care with follow-up as needed Final Clinical Impressions(s) / UC Diagnoses   Final diagnoses:  Suprapubic pain     Discharge Instructions      Today you are evaluated for your abdominal symptoms  May continue use of Zofran as needed for nausea  We will prophylactically send you an antibiotic as soon as we are able to get a urine sample for testing, medicine to be used will be cephalexin every 8 hours for 5 days, will be sent in as soon as sample is brought to clinic, open form 8 am - 8 pm    sample provided will also be sent to the lab for 3 days to see and determine bacterial       ED Prescriptions   None    PDMP not reviewed this encounter.   Valinda Hoar, NP 04/09/23 [phone]

## 2023-04-09 NOTE — Discharge Instructions (Addendum)
 Today you are evaluated for your abdominal symptoms  May continue use of Zofran as needed for nausea  We will prophylactically send you an antibiotic as soon as we are able to get a urine sample for testing, medicine to be used will be cephalexin every 8 hours for 5 days, will be sent in as soon as sample is brought to clinic, open form 8 am - 8 pm    sample provided will also be sent to the lab for 3 days to see and determine bacterial

## 2023-04-09 NOTE — ED Notes (Signed)
 Patient attempted for urine sample. Unsuccessful.  Provided water

## 2023-04-09 NOTE — ED Notes (Signed)
 Patient attempted for urine sample, unsuccessful.  Tiffany, RT providing urine bag to take home.

## 2023-04-10 ENCOUNTER — Telehealth (INDEPENDENT_AMBULATORY_CARE_PROVIDER_SITE_OTHER): Admitting: Emergency Medicine

## 2023-04-10 ENCOUNTER — Other Ambulatory Visit (HOSPITAL_COMMUNITY)
Admission: RE | Admit: 2023-04-10 | Discharge: 2023-04-10 | Disposition: A | Discharge status: Home / Self Care | Attending: Emergency Medicine | Admitting: Emergency Medicine

## 2023-04-10 ENCOUNTER — Telehealth: Payer: Self-pay | Admitting: Emergency Medicine

## 2023-04-10 DIAGNOSIS — R102 Pelvic and perineal pain: Secondary | ICD-10-CM

## 2023-04-10 LAB — POCT URINALYSIS DIPSTICK
Bilirubin, UA: NEGATIVE — NL
Glucose, UA: NEGATIVE
Ketones, UA: NEGATIVE — NL
Nitrite, UA: NEGATIVE
Spec Grav, UA: >=1.03 — AB (ref 1.010–1.025)
Urobilinogen, UA: 0.2 U/dL
pH, UA: 5.5 (ref 5.0–8.0)

## 2023-04-10 MED ORDER — CEPHALEXIN 500 MG PO CAPS: 500.0000 mg | ORAL_CAPSULE | Freq: Three times a day (TID) | ORAL | 0 refills | Status: AC

## 2023-04-10 NOTE — Telephone Encounter (Signed)
 Orders placed for urine dropped off after visit

## 2023-04-10 NOTE — Telephone Encounter (Signed)
 Patient dropped off urine sample, will be sent to culture, prophylactically treating with cephalexin, sent to pharmacy

## 2023-04-11 LAB — URINE CULTURE: Culture: >=100000 — AB

## 2023-04-21 ENCOUNTER — Telehealth: Payer: Self-pay

## 2023-04-21 DIAGNOSIS — G14 Postpolio syndrome: Secondary | ICD-10-CM

## 2023-04-21 DIAGNOSIS — D508 Other iron deficiency anemias: Secondary | ICD-10-CM

## 2023-04-21 DIAGNOSIS — E876 Hypokalemia: Secondary | ICD-10-CM

## 2023-04-21 DIAGNOSIS — G20B2 Parkinson's disease with dyskinesia, with fluctuations: Secondary | ICD-10-CM

## 2023-04-21 DIAGNOSIS — E7800 Pure hypercholesterolemia, unspecified: Secondary | ICD-10-CM

## 2023-04-21 DIAGNOSIS — E559 Vitamin D deficiency, unspecified: Secondary | ICD-10-CM

## 2023-04-21 DIAGNOSIS — I10 Essential (primary) hypertension: Secondary | ICD-10-CM

## 2023-04-24 NOTE — Telephone Encounter (Signed)
 Patient notified

## 2023-04-25 ENCOUNTER — Telehealth: Payer: Self-pay | Admitting: Nurse Practitioner

## 2023-04-25 NOTE — Telephone Encounter (Signed)
 Patient lvm Friday afternoon wondering if her HYDROcodone had been sent in. I notified her refill was sent in 04/22/23 to [facility] on [address]

## 2023-05-03 ENCOUNTER — Encounter: Payer: Self-pay | Admitting: Nurse Practitioner

## 2023-05-06 ENCOUNTER — Other Ambulatory Visit: Payer: Self-pay

## 2023-05-06 DIAGNOSIS — Z79899 Other long term (current) drug therapy: Secondary | ICD-10-CM

## 2023-05-06 MED ORDER — ONDANSETRON 4 MG PO TBDP: 4.0000 mg | ORAL_TABLET | Freq: Three times a day (TID) | ORAL | 1 refills | Status: AC | PRN

## 2023-05-26 DIAGNOSIS — E876 Hypokalemia: Secondary | ICD-10-CM | POA: Diagnosis not present

## 2023-05-26 DIAGNOSIS — E7800 Pure hypercholesterolemia, unspecified: Secondary | ICD-10-CM | POA: Diagnosis not present

## 2023-05-26 DIAGNOSIS — G20B2 Parkinson's disease with dyskinesia, with fluctuations: Secondary | ICD-10-CM | POA: Diagnosis not present

## 2023-05-26 DIAGNOSIS — G14 Postpolio syndrome: Secondary | ICD-10-CM | POA: Diagnosis not present

## 2023-05-26 DIAGNOSIS — I10 Essential (primary) hypertension: Secondary | ICD-10-CM | POA: Diagnosis not present

## 2023-05-26 DIAGNOSIS — E559 Vitamin D deficiency, unspecified: Secondary | ICD-10-CM | POA: Diagnosis not present

## 2023-05-27 LAB — CMP14+EGFR
ALT: 8 IU/L (ref 0–32)
AST: 11 IU/L (ref 0–40)
Albumin: 4.2 g/dL (ref 3.8–4.8)
Alkaline Phosphatase: 102 IU/L (ref 44–121)
BUN/Creatinine Ratio: 21 (ref 12–28)
BUN: 19 mg/dL (ref 8–27)
Bilirubin Total: 0.3 mg/dL (ref 0.0–1.2)
CO2: 25 mmol/L (ref 20–29)
Calcium: 9.3 mg/dL (ref 8.7–10.3)
Chloride: 103 mmol/L (ref 96–106)
Creatinine, Ser: 0.89 mg/dL (ref 0.57–1.00)
Globulin, Total: 2.2 g/dL (ref 1.5–4.5)
Glucose: 91 mg/dL (ref 70–99)
Potassium: 4.4 mmol/L (ref 3.5–5.2)
Sodium: 141 mmol/L (ref 134–144)
Total Protein: 6.4 g/dL (ref 6.0–8.5)
eGFR: 67 mL/min/{1.73_m2} (ref >59)

## 2023-05-27 LAB — CBC WITH DIFFERENTIAL/PLATELET
Basophils Absolute: 0.1 10*3/uL (ref 0.0–0.2)
Basos: 1 %
EOS (ABSOLUTE): 0.2 10*3/uL (ref 0.0–0.4)
Eos: 3 %
Hematocrit: 38.5 % (ref 34.0–46.6)
Hemoglobin: 12.2 g/dL (ref 11.1–15.9)
Immature Grans (Abs): 0 10*3/uL (ref 0.0–0.1)
Immature Granulocytes: 0 %
Lymphocytes Absolute: 1.9 10*3/uL (ref 0.7–3.1)
Lymphs: 33 %
MCH: 27.7 pg (ref 26.6–33.0)
MCHC: 31.7 g/dL (ref 31.5–35.7)
MCV: 87 fL (ref 79–97)
Monocytes Absolute: 0.5 10*3/uL (ref 0.1–0.9)
Monocytes: 9 %
Neutrophils Absolute: 3 10*3/uL (ref 1.4–7.0)
Neutrophils: 54 %
Platelets: 331 10*3/uL (ref 150–450)
RBC: 4.41 x10E6/uL (ref 3.77–5.28)
RDW: 12.7 % (ref 11.7–15.4)
WBC: 5.6 10*3/uL (ref 3.4–10.8)

## 2023-05-27 LAB — LIPID PANEL
Chol/HDL Ratio: 2 ratio (ref 0.0–4.4)
Cholesterol, Total: 142 mg/dL (ref 100–199)
HDL: 71 mg/dL (ref >39)
LDL Chol Calc (NIH): 55 mg/dL (ref 0–99)
Triglycerides: 86 mg/dL (ref 0–149)
VLDL Cholesterol Cal: 16 mg/dL (ref 5–40)

## 2023-05-27 LAB — TSH+FREE T4
Free T4: 1.28 ng/dL (ref 0.82–1.77)
TSH: 1.82 u[IU]/mL (ref 0.450–4.500)

## 2023-05-27 LAB — VITAMIN D 25 HYDROXY (VIT D DEFICIENCY, FRACTURES): Vit D, 25-Hydroxy: 23.7 ng/mL — ABNORMAL LOW (ref 30.0–100.0)

## 2023-06-23 ENCOUNTER — Encounter: Payer: Self-pay | Admitting: Nurse Practitioner

## 2023-06-23 ENCOUNTER — Ambulatory Visit: Admitting: Nurse Practitioner

## 2023-06-23 VITALS — BP 135/70 | HR 65 | Temp 98.1°F | Resp 16 | Ht 63.0 in | Wt 150.2 lb

## 2023-06-23 DIAGNOSIS — M150 Primary generalized (osteo)arthritis: Secondary | ICD-10-CM

## 2023-06-23 DIAGNOSIS — E876 Hypokalemia: Secondary | ICD-10-CM

## 2023-06-23 DIAGNOSIS — E782 Mixed hyperlipidemia: Secondary | ICD-10-CM

## 2023-06-23 DIAGNOSIS — F331 Major depressive disorder, recurrent, moderate: Secondary | ICD-10-CM

## 2023-06-23 DIAGNOSIS — K5903 Drug induced constipation: Secondary | ICD-10-CM

## 2023-06-23 DIAGNOSIS — G14 Postpolio syndrome: Secondary | ICD-10-CM

## 2023-06-23 DIAGNOSIS — I10 Essential (primary) hypertension: Secondary | ICD-10-CM | POA: Diagnosis not present

## 2023-06-23 DIAGNOSIS — N3946 Mixed incontinence: Secondary | ICD-10-CM

## 2023-06-23 DIAGNOSIS — Z79899 Other long term (current) drug therapy: Secondary | ICD-10-CM

## 2023-06-23 MED ORDER — BUPROPION HCL ER (XL) 150 MG PO TB24: 150.0000 mg | ORAL_TABLET | Freq: Every day | ORAL | 1 refills | Status: DC

## 2023-06-23 MED ORDER — SIMVASTATIN 20 MG PO TABS: ORAL_TABLET | ORAL | 1 refills | Status: DC

## 2023-06-23 MED ORDER — HYDROCODONE-ACETAMINOPHEN 10-325 MG PO TABS
0.5000 | ORAL_TABLET | Freq: Three times a day (TID) | ORAL | 0 refills | Status: DC | PRN
Start: 2023-08-18 — End: 2023-08-12

## 2023-06-23 MED ORDER — LOSARTAN POTASSIUM 100 MG PO TABS: 100.0000 mg | ORAL_TABLET | Freq: Every day | ORAL | 1 refills | Status: DC

## 2023-06-23 MED ORDER — POTASSIUM CHLORIDE CRYS ER 20 MEQ PO TBCR: 20.0000 meq | EXTENDED_RELEASE_TABLET | Freq: Every day | ORAL | 1 refills | Status: DC

## 2023-06-23 MED ORDER — HYDROCODONE-ACETAMINOPHEN 10-325 MG PO TABS: 0.5000 | ORAL_TABLET | Freq: Three times a day (TID) | ORAL | 0 refills | Status: DC | PRN

## 2023-06-23 MED ORDER — PANTOPRAZOLE SODIUM 40 MG PO TBEC
40.0000 mg | DELAYED_RELEASE_TABLET | Freq: Two times a day (BID) | ORAL | 1 refills | Status: DC
Start: 2023-06-23 — End: 2023-09-17

## 2023-06-23 MED ORDER — METOPROLOL TARTRATE 25 MG PO TABS: ORAL_TABLET | ORAL | 1 refills | Status: DC

## 2023-06-23 MED ORDER — ESCITALOPRAM OXALATE 20 MG PO TABS: 20.0000 mg | ORAL_TABLET | Freq: Every day | ORAL | 1 refills | Status: DC

## 2023-06-23 MED ORDER — OXYBUTYNIN CHLORIDE ER 10 MG PO TB24: 20.0000 mg | ORAL_TABLET | Freq: Every day | ORAL | 1 refills | Status: DC

## 2023-06-23 NOTE — Progress Notes (Signed)
 [facility] [address] [place], [place] [address]  Internal MEDICINE  Office Visit Note  Patient Name: Susan Shah  [id number]  [id number]  Date of Service: 06/23/2023  Chief Complaint  Patient presents with   Depression   Gastroesophageal Reflux   Hyperlipidemia   Hypertension   Follow-up    HPI Shelly presents for a follow-up visit for reviewed lab results, chronic pain, and constipation  Reviewed labs with patient, all labs are normal except for mildly low vitamin D   Chronic pain -- takes hydrocodone  as needed.  Constipation -- has not tried anything for this. Most likely opioid induced. Reports hard stools and having difficulty having a BM, having to strain    Current Medication: Outpatient Encounter Medications as of 06/23/2023  Medication Sig   acetaminophen  (TYLENOL ) 500 MG tablet Take 1,000 mg by mouth every 6 (six) hours as needed for moderate pain or headache.   amLODipine  (NORVASC ) 2.5 MG tablet TAKE 1 TABLET BY MOUTH EVERY DAY- STOP LISINOPRIL    Carboxymethylcellul-Glycerin (LUBRICATING EYE DROPS OP) Place 1 drop into both eyes daily as needed (dry eyes).   cholecalciferol  (VITAMIN D3) 25 MCG (1000 UNIT) tablet Take 1,000 Units by mouth daily.   famotidine  (PEPCID ) 40 MG tablet TAKE 1 TABLET(40 MG) BY MOUTH TWICE DAILY   fluticasone  (FLONASE ) 50 MCG/ACT nasal spray Place 1 spray into both nostrils at bedtime as needed for allergies or rhinitis.   loratadine  (QC LORATADINE  ALLERGY RELIEF) 10 MG tablet Take 1 tablet (10 mg total) by mouth daily.   ondansetron  (ZOFRAN -ODT) 4 MG disintegrating tablet Take 1 tablet (4 mg total) by mouth every 8 (eight) hours as needed for nausea or vomiting.   rOPINIRole  (REQUIP ) 0.25 MG tablet Take by mouth.   vitamin B-12 (CYANOCOBALAMIN ) 1000 MCG tablet Take 1,000 mcg by mouth daily.   [DISCONTINUED] buPROPion  (WELLBUTRIN  XL) 150 MG 24 hr tablet Take 1 tablet (150 mg total) by mouth daily.   [DISCONTINUED] escitalopram   (LEXAPRO ) 20 MG tablet TAKE 1 TABLET BY MOUTH EVERY DAY   [DISCONTINUED] HYDROcodone -acetaminophen  (NORCO) 10-325 MG tablet Take 0.5 tablets by mouth every 8 (eight) hours as needed for moderate pain (pain score 4-6) or severe pain (pain score 7-10).   [DISCONTINUED] HYDROcodone -acetaminophen  (NORCO) 10-325 MG tablet Take 0.5 tablets by mouth every 8 (eight) hours as needed for moderate pain (pain score 4-6) or severe pain (pain score 7-10).   [DISCONTINUED] HYDROcodone -acetaminophen  (NORCO) 10-325 MG tablet Take 0.5 tablets by mouth every 8 (eight) hours as needed for moderate pain (pain score 4-6) or severe pain (pain score 7-10).   [DISCONTINUED] losartan  (COZAAR ) 100 MG tablet Take 1 tablet (100 mg total) by mouth daily.   [DISCONTINUED] metoprolol  tartrate (LOPRESSOR ) 25 MG tablet TAKE 1 TABLET (25 MG TOTAL) BY MOUTH TWICE A DAY WITH MEALS   [DISCONTINUED] oxybutynin  (DITROPAN -XL) 10 MG 24 hr tablet Take 2 tablets (20 mg total) by mouth daily.   [DISCONTINUED] pantoprazole  (PROTONIX ) 40 MG tablet TAKE 1 TABLET BY MOUTH TWICE DAILY   [DISCONTINUED] potassium chloride  SA (KLOR-CON  M) 20 MEQ tablet Take 1 tablet (20 mEq total) by mouth daily.   [DISCONTINUED] simvastatin  (ZOCOR ) 20 MG tablet TAKE 1 TABLET BY MOUTH DAILY AT 6 PM.   buPROPion  (WELLBUTRIN  XL) 150 MG 24 hr tablet Take 1 tablet (150 mg total) by mouth daily.   Carbidopa -Levodopa  ER (SINEMET  CR) 25-100 MG tablet controlled release Take 1 tablet by mouth 3 (three) times daily.   escitalopram  (LEXAPRO ) 20 MG tablet Take  1 tablet (20 mg total) by mouth daily.   [START ON 08/18/2023] HYDROcodone -acetaminophen  (NORCO) 10-325 MG tablet Take 0.5 tablets by mouth every 8 (eight) hours as needed for moderate pain (pain score 4-6) or severe pain (pain score 7-10).   [START ON 07/21/2023] HYDROcodone -acetaminophen  (NORCO) 10-325 MG tablet Take 0.5 tablets by mouth every 8 (eight) hours as needed for moderate pain (pain score 4-6) or severe pain (pain  score 7-10).   HYDROcodone -acetaminophen  (NORCO) 10-325 MG tablet Take 0.5 tablets by mouth every 8 (eight) hours as needed for moderate pain (pain score 4-6) or severe pain (pain score 7-10).   losartan  (COZAAR ) 100 MG tablet Take 1 tablet (100 mg total) by mouth daily.   metoprolol  tartrate (LOPRESSOR ) 25 MG tablet TAKE 1 TABLET (25 MG TOTAL) BY MOUTH TWICE A DAY WITH MEALS   oxybutynin  (DITROPAN -XL) 10 MG 24 hr tablet Take 2 tablets (20 mg total) by mouth daily.   pantoprazole  (PROTONIX ) 40 MG tablet Take 1 tablet (40 mg total) by mouth 2 (two) times daily.   potassium chloride  SA (KLOR-CON  M) 20 MEQ tablet Take 1 tablet (20 mEq total) by mouth daily.   simvastatin  (ZOCOR ) 20 MG tablet TAKE 1 TABLET BY MOUTH DAILY AT 6 PM.   No facility-administered encounter medications on file as of 06/23/2023.    Surgical History: Past Surgical History:  Procedure Laterality Date   71 HOUR PH STUDY N/A 04/24/2016   Procedure: 24 HOUR PH STUDY;  Surgeon: Marnee Sink, MD;  Location: [facility] ENDOSCOPY;  Service: Endoscopy;  Laterality: N/A;   ABDOMINAL HYSTERECTOMY     Total   APPENDECTOMY     CARPOMETACARPAL (CMC) FUSION OF THUMB Left 05/16/2016   Procedure: CARPOMETACARPAL [facility]) FUSION OF THUMB;  Surgeon: Molli Angelucci, MD;  Location: [facility] ORS;  Service: Orthopedics;  Laterality: Left;   CARPOMETACARPAL (CMC) FUSION OF THUMB Left 03/30/2019   Procedure: LEFT THUMB SUSPENSION PLASTY;  Surgeon: Molli Angelucci, MD;  Location: [facility] ORS;  Service: Orthopedics;  Laterality: Left;   CATARACT EXTRACTION Bilateral 12/2012   CHOLECYSTECTOMY     COLONOSCOPY  2003   ESOPHAGEAL MANOMETRY N/A 04/24/2016   Procedure: ESOPHAGEAL MANOMETRY (EM);  Surgeon: Marnee Sink, MD;  Location: [facility] ENDOSCOPY;  Service: Endoscopy;  Laterality: N/A;   ESOPHAGOGASTRODUODENOSCOPY  08/2011   FOOT SURGERY     HALLUX VALGUS CORRECTION     HIP SURGERY Right 1950   tendon release r/t polio   KNEE ARTHROSCOPY Left 06/23/2008   RETINAL  DETACHMENT SURGERY Right 03/2014   ROTATOR CUFF REPAIR Bilateral    URINARY SURGERY  2014   [place]     Medical History: Past Medical History:  Diagnosis Date   Anxiety    Asthma 2015   mild, seasonal allergy triggered.   Cancer (HCC) 2013   skin cancer  on left hand   Depression    Esophageal dysmotility    GERD (gastroesophageal reflux disease) 11/05/2012   History of hiatal hernia    Hyperlipidemia    Hypertension    Incontinence of urine    Lung nodule    OA (osteoarthritis)    Obesity    Parkinson's disease (HCC)    PONV (postoperative nausea and vomiting)    Post-polio syndrome    contracted at 33 months old   Pulmonary nodule 11/05/2012   RML   Shingles 2009   Sleep apnea     Family History: Family History  Problem Relation Age of Onset   Heart disease Mother  Heart disease Father    Heart attack Sister    Heart disease Brother    Heart attack Brother    Breast cancer Maternal Aunt    Stroke Maternal Grandmother    Heart disease Maternal Grandfather     Social History   Socioeconomic History   Marital status: Married    Spouse name: Not on file   Number of children: Not on file   Years of education: Not on file   Highest education level: Not on file  Occupational History   Not on file  Tobacco Use   Smoking status: Former    Current packs/day: 0.00    Types: Cigarettes    Quit date: 05/09/1968    Years since quitting: 55.1   Smokeless tobacco: Never  Vaping Use   Vaping status: Never Used  Substance and Sexual Activity   Alcohol use: No   Drug use: No   Sexual activity: Not on file  Other Topics Concern   Not on file  Social History Narrative   Not on file   Social Drivers of Health   Financial Resource Strain: Not on file  Food Insecurity: Not on file  Transportation Needs: Not on file  Physical Activity: Not on file  Stress: Not on file  Social Connections: Not on file  Intimate Partner Violence: Not on file       Review of Systems  Constitutional:  Negative for appetite change, chills, fatigue and unexpected weight change.  HENT:  Negative for congestion, rhinorrhea, sneezing and sore throat.   Eyes:  Negative for redness.  Respiratory: Negative.  Negative for cough, chest tightness, shortness of breath and wheezing.   Cardiovascular: Negative.  Negative for chest pain and palpitations.  Gastrointestinal:  Negative for abdominal pain, constipation, diarrhea and nausea.  Genitourinary:  Negative for dysuria and frequency.  Musculoskeletal:  Positive for arthralgias and gait problem. Negative for back pain, joint swelling and neck pain.  Skin:  Negative for rash.  Neurological:  Negative for tremors and numbness.  Hematological:  Negative for adenopathy. Does not bruise/bleed easily.  Psychiatric/Behavioral:  Negative for behavioral problems (Depression), sleep disturbance and suicidal ideas. The patient is not nervous/anxious.     Vital Signs: BP 135/70 Comment: 148/77  Pulse 65   Temp 98.1 F (36.7 C)   Resp 16   Ht 5\' 3"  (1.6 m)   Wt 150 lb 3.2 oz (68.1 kg)   SpO2 97%   BMI 26.61 kg/m    Physical Exam Vitals reviewed.  Constitutional:      General: She is not in acute distress.    Appearance: Normal appearance. She is not ill-appearing.  HENT:     Head: Normocephalic and atraumatic.  Eyes:     Pupils: Pupils are equal, round, and reactive to light.  Cardiovascular:     Rate and Rhythm: Normal rate and regular rhythm.  Pulmonary:     Effort: Pulmonary effort is normal. No respiratory distress.  Neurological:     Mental Status: She is alert and oriented to person, place, and time.     Gait: Gait abnormal.  Psychiatric:        Mood and Affect: Mood normal.        Behavior: Behavior normal.        Assessment/Plan: 1. Essential hypertension (Primary) Stable, continue losartan  and metoprolol  as prescribed.  - losartan  (COZAAR ) 100 MG tablet; Take 1 tablet (100 mg  total) by mouth daily.  Dispense: 90 tablet; Refill: 1 -  metoprolol  tartrate (LOPRESSOR ) 25 MG tablet; TAKE 1 TABLET (25 MG TOTAL) BY MOUTH TWICE A DAY WITH MEALS  Dispense: 180 tablet; Refill: 1  2. Mixed hyperlipidemia Continue simvastatin  as prescribed.  - simvastatin  (ZOCOR ) 20 MG tablet; TAKE 1 TABLET BY MOUTH DAILY AT 6 PM.  Dispense: 90 tablet; Refill: 1  3. Post-polio syndrome Continue prn hydrocodone  as prescribed. Follow up in 3 months for additional refills  - HYDROcodone -acetaminophen  (NORCO) 10-325 MG tablet; Take 0.5 tablets by mouth every 8 (eight) hours as needed for moderate pain (pain score 4-6) or severe pain (pain score 7-10).  Dispense: 30 tablet; Refill: 0 - HYDROcodone -acetaminophen  (NORCO) 10-325 MG tablet; Take 0.5 tablets by mouth every 8 (eight) hours as needed for moderate pain (pain score 4-6) or severe pain (pain score 7-10).  Dispense: 30 tablet; Refill: 0 - HYDROcodone -acetaminophen  (NORCO) 10-325 MG tablet; Take 0.5 tablets by mouth every 8 (eight) hours as needed for moderate pain (pain score 4-6) or severe pain (pain score 7-10).  Dispense: 30 tablet; Refill: 0  4. Drug-induced constipation Discussed OTC medications and interventions that can be used.   5. Hypokalemia Continue potassium supplement as prescribed.  - potassium chloride  SA (KLOR-CON  M) 20 MEQ tablet; Take 1 tablet (20 mEq total) by mouth daily.  Dispense: 90 tablet; Refill: 1  6. Primary osteoarthritis involving multiple joints Continue prn hydrocodone  as prescribed. Follow up in 3 months for additional refills  - HYDROcodone -acetaminophen  (NORCO) 10-325 MG tablet; Take 0.5 tablets by mouth every 8 (eight) hours as needed for moderate pain (pain score 4-6) or severe pain (pain score 7-10).  Dispense: 30 tablet; Refill: 0 - HYDROcodone -acetaminophen  (NORCO) 10-325 MG tablet; Take 0.5 tablets by mouth every 8 (eight) hours as needed for moderate pain (pain score 4-6) or severe pain (pain score  7-10).  Dispense: 30 tablet; Refill: 0 - HYDROcodone -acetaminophen  (NORCO) 10-325 MG tablet; Take 0.5 tablets by mouth every 8 (eight) hours as needed for moderate pain (pain score 4-6) or severe pain (pain score 7-10).  Dispense: 30 tablet; Refill: 0  7. Mixed stress and urge urinary incontinence Continue oxybutynin  as prescribed  - oxybutynin  (DITROPAN -XL) 10 MG 24 hr tablet; Take 2 tablets (20 mg total) by mouth daily.  Dispense: 180 tablet; Refill: 1  8. Encounter for medication review Medication list reviewed, updated and refills ordered - pantoprazole  (PROTONIX ) 40 MG tablet; Take 1 tablet (40 mg total) by mouth 2 (two) times daily.  Dispense: 180 tablet; Refill: 1 - escitalopram  (LEXAPRO ) 20 MG tablet; Take 1 tablet (20 mg total) by mouth daily.  Dispense: 90 tablet; Refill: 1  9. Moderate episode of recurrent major depressive disorder (HCC) Continue bupropion  as prescribed  - buPROPion  (WELLBUTRIN  XL) 150 MG 24 hr tablet; Take 1 tablet (150 mg total) by mouth daily.  Dispense: 90 tablet; Refill: 1   General Counseling: Izumi verbalizes understanding of the findings of todays visit and agrees with plan of treatment. I have discussed any further diagnostic evaluation that may be needed or ordered today. We also reviewed her medications today. she has been encouraged to call the office with any questions or concerns that should arise related to todays visit.    No orders of the defined types were placed in this encounter.   Meds ordered this encounter  Medications   HYDROcodone -acetaminophen  (NORCO) 10-325 MG tablet    Sig: Take 0.5 tablets by mouth every 8 (eight) hours as needed for moderate pain (pain score 4-6) or severe pain (pain score 7-10).  Dispense:  30 tablet    Refill:  0    Fill for July 28   HYDROcodone -acetaminophen  (NORCO) 10-325 MG tablet    Sig: Take 0.5 tablets by mouth every 8 (eight) hours as needed for moderate pain (pain score 4-6) or severe pain (pain  score 7-10).    Dispense:  30 tablet    Refill:  0    Fill for June 30   HYDROcodone -acetaminophen  (NORCO) 10-325 MG tablet    Sig: Take 0.5 tablets by mouth every 8 (eight) hours as needed for moderate pain (pain score 4-6) or severe pain (pain score 7-10).    Dispense:  30 tablet    Refill:  0    Fill for June 2   losartan  (COZAAR ) 100 MG tablet    Sig: Take 1 tablet (100 mg total) by mouth daily.    Dispense:  90 tablet    Refill:  1    For future refills   metoprolol  tartrate (LOPRESSOR ) 25 MG tablet    Sig: TAKE 1 TABLET (25 MG TOTAL) BY MOUTH TWICE A DAY WITH MEALS    Dispense:  180 tablet    Refill:  1    For future refills   pantoprazole  (PROTONIX ) 40 MG tablet    Sig: Take 1 tablet (40 mg total) by mouth 2 (two) times daily.    Dispense:  180 tablet    Refill:  1   oxybutynin  (DITROPAN -XL) 10 MG 24 hr tablet    Sig: Take 2 tablets (20 mg total) by mouth daily.    Dispense:  180 tablet    Refill:  1    For future refills   simvastatin  (ZOCOR ) 20 MG tablet    Sig: TAKE 1 TABLET BY MOUTH DAILY AT 6 PM.    Dispense:  90 tablet    Refill:  1    For future refills   potassium chloride  SA (KLOR-CON  M) 20 MEQ tablet    Sig: Take 1 tablet (20 mEq total) by mouth daily.    Dispense:  90 tablet    Refill:  1    For future refills   escitalopram  (LEXAPRO ) 20 MG tablet    Sig: Take 1 tablet (20 mg total) by mouth daily.    Dispense:  90 tablet    Refill:  1   buPROPion  (WELLBUTRIN  XL) 150 MG 24 hr tablet    Sig: Take 1 tablet (150 mg total) by mouth daily.    Dispense:  90 tablet    Refill:  1    For future refills    Return in about 3 months (around 09/18/2023) for F/U, pain med refill, Zaia Carre PCP.   Total time spent:30 Minutes Time spent includes review of chart, medications, test results, and follow up plan with the patient.   [place] Controlled Substance Database was reviewed by me.  This patient was seen by Laurence Pons, FNP-C in collaboration with Dr. Verneta Gone as a part of collaborative care agreement.   Talin Rozeboom R. Bobbi Burow, MSN, FNP-C Internal medicine

## 2023-06-23 NOTE — Patient Instructions (Signed)
 Try over the counter stool soften like colace (docusate sodium ) and take 1 capsule once or twice daily.

## 2023-06-30 ENCOUNTER — Encounter: Payer: Self-pay | Admitting: Nurse Practitioner

## 2023-06-30 DIAGNOSIS — G20B1 Parkinson's disease with dyskinesia, without mention of fluctuations: Secondary | ICD-10-CM | POA: Diagnosis not present

## 2023-06-30 DIAGNOSIS — Z1331 Encounter for screening for depression: Secondary | ICD-10-CM | POA: Diagnosis not present

## 2023-06-30 DIAGNOSIS — G8311 Monoplegia of lower limb affecting right dominant side: Secondary | ICD-10-CM | POA: Diagnosis not present

## 2023-06-30 DIAGNOSIS — R441 Visual hallucinations: Secondary | ICD-10-CM | POA: Diagnosis not present

## 2023-07-02 DIAGNOSIS — R441 Visual hallucinations: Secondary | ICD-10-CM | POA: Diagnosis not present

## 2023-07-02 DIAGNOSIS — G20B1 Parkinson's disease with dyskinesia, without mention of fluctuations: Secondary | ICD-10-CM | POA: Diagnosis not present

## 2023-07-10 ENCOUNTER — Other Ambulatory Visit: Payer: Self-pay | Admitting: Nurse Practitioner

## 2023-07-10 DIAGNOSIS — Z1231 Encounter for screening mammogram for malignant neoplasm of breast: Secondary | ICD-10-CM

## 2023-07-20 ENCOUNTER — Other Ambulatory Visit: Payer: Self-pay | Admitting: Nurse Practitioner

## 2023-07-20 DIAGNOSIS — K219 Gastro-esophageal reflux disease without esophagitis: Secondary | ICD-10-CM

## 2023-07-21 ENCOUNTER — Encounter

## 2023-07-21 DIAGNOSIS — K080 Exfoliation of teeth due to systemic causes: Secondary | ICD-10-CM | POA: Diagnosis not present

## 2023-08-01 DIAGNOSIS — H00015 Hordeolum externum left lower eyelid: Secondary | ICD-10-CM | POA: Diagnosis not present

## 2023-08-04 ENCOUNTER — Encounter

## 2023-08-04 DIAGNOSIS — M25551 Pain in right hip: Secondary | ICD-10-CM | POA: Diagnosis not present

## 2023-08-04 DIAGNOSIS — R202 Paresthesia of skin: Secondary | ICD-10-CM | POA: Diagnosis not present

## 2023-08-07 DIAGNOSIS — M4807 Spinal stenosis, lumbosacral region: Secondary | ICD-10-CM | POA: Diagnosis not present

## 2023-08-09 ENCOUNTER — Emergency Department

## 2023-08-09 ENCOUNTER — Other Ambulatory Visit: Payer: Self-pay

## 2023-08-09 ENCOUNTER — Observation Stay
Admission: EM | Admit: 2023-08-09 | Discharge: 2023-08-12 | Disposition: A | Discharge status: Home / Self Care | Attending: Hospitalist | Admitting: Hospitalist

## 2023-08-09 DIAGNOSIS — S82114A Nondisplaced fracture of right tibial spine, initial encounter for closed fracture: Secondary | ICD-10-CM | POA: Diagnosis present

## 2023-08-09 DIAGNOSIS — Z043 Encounter for examination and observation following other accident: Secondary | ICD-10-CM | POA: Diagnosis not present

## 2023-08-09 DIAGNOSIS — I10 Essential (primary) hypertension: Secondary | ICD-10-CM | POA: Diagnosis present

## 2023-08-09 DIAGNOSIS — M150 Primary generalized (osteo)arthritis: Secondary | ICD-10-CM

## 2023-08-09 DIAGNOSIS — M79604 Pain in right leg: Secondary | ICD-10-CM | POA: Diagnosis not present

## 2023-08-09 DIAGNOSIS — E785 Hyperlipidemia, unspecified: Secondary | ICD-10-CM | POA: Insufficient documentation

## 2023-08-09 DIAGNOSIS — E876 Hypokalemia: Secondary | ICD-10-CM | POA: Diagnosis not present

## 2023-08-09 DIAGNOSIS — Z87891 Personal history of nicotine dependence: Secondary | ICD-10-CM | POA: Insufficient documentation

## 2023-08-09 DIAGNOSIS — S82201A Unspecified fracture of shaft of right tibia, initial encounter for closed fracture: Secondary | ICD-10-CM | POA: Diagnosis not present

## 2023-08-09 DIAGNOSIS — G20A1 Parkinson's disease without dyskinesia, without mention of fluctuations: Secondary | ICD-10-CM | POA: Diagnosis not present

## 2023-08-09 DIAGNOSIS — W19XXXA Unspecified fall, initial encounter: Secondary | ICD-10-CM

## 2023-08-09 DIAGNOSIS — S82234A Nondisplaced oblique fracture of shaft of right tibia, initial encounter for closed fracture: Principal | ICD-10-CM

## 2023-08-09 DIAGNOSIS — G14 Postpolio syndrome: Secondary | ICD-10-CM

## 2023-08-09 DIAGNOSIS — W010XXA Fall on same level from slipping, tripping and stumbling without subsequent striking against object, initial encounter: Secondary | ICD-10-CM | POA: Diagnosis not present

## 2023-08-09 DIAGNOSIS — M84474A Pathological fracture, right foot, initial encounter for fracture: Secondary | ICD-10-CM | POA: Diagnosis not present

## 2023-08-09 DIAGNOSIS — F419 Anxiety disorder, unspecified: Secondary | ICD-10-CM | POA: Insufficient documentation

## 2023-08-09 DIAGNOSIS — M85871 Other specified disorders of bone density and structure, right ankle and foot: Secondary | ICD-10-CM | POA: Diagnosis not present

## 2023-08-09 DIAGNOSIS — F32A Depression, unspecified: Secondary | ICD-10-CM | POA: Insufficient documentation

## 2023-08-09 DIAGNOSIS — M19071 Primary osteoarthritis, right ankle and foot: Secondary | ICD-10-CM | POA: Diagnosis not present

## 2023-08-09 MED ORDER — OXYCODONE-ACETAMINOPHEN 5-325 MG PO TABS
1.0000 | ORAL_TABLET | Freq: Once | ORAL | Status: AC
  Administered 2023-08-09: 1 via ORAL
  Filled 2023-08-09: qty 1

## 2023-08-09 NOTE — ED Triage Notes (Signed)
 Pt arrives [facility] from home for a fall with C/O pain in both legs (8/10). No LOC, didn't hit head, not on blood thinners. hx of parkinson.   164/138 86 bpm 96 o2 98.1 100 CBG

## 2023-08-09 NOTE — ED Provider Notes (Incomplete)
 [facility] Provider Note    Event Date/Time   First MD Initiated Contact with Patient 08/09/23 2313     (approximate)   History   Fall   HPI Susan Shah is a 78 y.o. female with history of Parkinson's presenting today for fall.  Patient was coming back from the bathroom and when she went to sit down on the bed missed it and fell down.  She reports her right leg got caught underneath her.  Noting pain in her right hip, right knee, and down the shin to her ankle.  Denies any numbness or tingling.  Denies head injury or loss of consciousness.  No injury elsewhere.     Physical Exam   Triage Vital Signs: ED Triage Vitals [08/09/23 2319]  Encounter Vitals Group     BP      Girls Systolic BP Percentile      Girls Diastolic BP Percentile      Boys Systolic BP Percentile      Boys Diastolic BP Percentile      Pulse      Resp      Temp      Temp src      SpO2      Weight 145 lb (65.8 kg)     Height 5' 2 (1.575 m)     Head Circumference      Peak Flow      Pain Score      Pain Loc      Pain Education      Exclude from Growth Chart     Most recent vital signs: Vitals:   08/09/23 2326  BP: (!) 173/79  Pulse: 68  Resp: 18  Temp: 98.3 F (36.8 C)  SpO2: 99%    I have reviewed the vital signs. General:  Awake, alert, no acute distress. Head:  Normocephalic, Atraumatic. EENT:  PERRL, EOMI, Oral mucosa pink and moist, Neck is supple. Cardiovascular: Regular rate, 2+ distal pulses. Respiratory:  Normal respiratory effort, symmetrical expansion, no distress.   Extremities: Tenderness to palpation throughout the right lower extremity starting at the hip, right knee, and right shin/ankle.  No obvious deformity or bruising.  Sensation intact throughout.  Nontender elsewise throughout left lower extremity and bilateral upper extremities. Neuro:  Alert and oriented.  Interacting appropriately.   Skin:  Warm, dry, no rash.   Psych: Appropriate  affect.    ED Results / Procedures / Treatments   Labs (all labs ordered are listed, but only abnormal results are displayed) Labs Reviewed  CBC WITH DIFFERENTIAL/PLATELET - Abnormal; Notable for the following components:      Result Value   WBC 11.8 (*)    Neutro Abs 9.4 (*)    All other components within normal limits  BASIC METABOLIC PANEL WITH GFR - Abnormal; Notable for the following components:   Potassium 3.3 (*)    Glucose, Bld 111 (*)    BUN 25 (*)    All other components within normal limits     EKG My EKG interpretation: Rate of 68, appears normal sinus rhythm but from her Parkinson's hard to get definitive baseline.  No obvious ST elevations or depressions   RADIOLOGY Independently interpreted x-ray showing evidence of nondisplaced oblique proximal tibia fracture.  X-rays elsewise negative for acute fractures   PROCEDURES:  Critical Care performed: No  .Splint Application  Date/Time: 08/10/2023 2:37 AM  Performed by: Malvina Alm DASEN, MD Authorized by: Malvina Alm DASEN, MD  Consent:    Consent obtained:  Verbal   Consent given by:  Patient   Risks, benefits, and alternatives were discussed: yes     Risks discussed:  Discoloration and numbness   Alternatives discussed:  No treatment Universal protocol:    Patient identity confirmed:  Verbally with patient Pre-procedure details:    Distal neurologic exam:  Normal   Distal perfusion: distal pulses strong and brisk capillary refill   Procedure details:    Location:  Leg   Leg location:  R lower leg   Splint type:  Long leg   Supplies:  Elastic bandage, cotton padding and fiberglass   Splint applied and adjusted personally by me: applied by tech and adjusted by me.   Post-procedure details:    Distal neurologic exam:  Normal   Distal perfusion: distal pulses strong and brisk capillary refill     Procedure completion:  Tolerated well, no immediate complications   Post-procedure imaging: not applicable       MEDICATIONS ORDERED IN ED: Medications  oxyCODONE -acetaminophen  (PERCOCET/ROXICET) 5-325 MG per tablet 1 tablet (1 tablet Oral Given 08/09/23 2348)     IMPRESSION / MDM / ASSESSMENT AND PLAN / ED COURSE  I reviewed the triage vital signs and the nursing notes.                              Differential diagnosis includes, but is not limited to, proximal femur fracture, distal femur/proximal tibia fracture, distal tibia/fibula fracture, soft tissue bruising  Patient's presentation is most consistent with acute complicated illness / injury requiring diagnostic workup.  Patient is a 78 year old female presenting today for ground-level fall with injury to her right leg.  Exam shows tenderness throughout the right leg so we will get x-rays for evaluation although no obvious deformity is present.  No head injury or loss of consciousness.  Will give Percocet for pain control.  X-ray of the tibia shows nondisplaced oblique proximal tibia fracture.  X-rays elsewise of the hip, ankle, and foot are negative for fracture.  Laboratory workup reassuring.  Discussed case with orthopedics, Dr. Cleotilde.  Recommends long-leg splint and will need orthopedic follow-up for likely cast.  No surgical intervention.  Given that patient at baseline has Parkinson's and use crutches for ambulation, she will not be able to weight-bear on that leg at this time.  Does not have proper care at home and will need to be admitted for PT/OT and likely rehab placement while recovering.  Patient admitted to hospitalist service for further care.  Clinical Course as of 08/10/23 9776  [place] Aug 10, 2023  0052 Orthopedics consulted for tibia fracture [DW]  0130 Dr. Cleotilde states patient will need to be placed in a long-leg splint and typically will follow-up with orthopedics in several days to be placed in cast but is nonweightbearing on that leg. [DW]    Clinical Course User Index [DW] Malvina Alm DASEN, MD     FINAL CLINICAL  IMPRESSION(S) / ED DIAGNOSES   Final diagnoses:  Nondisplaced oblique fracture of shaft of right tibia, initial encounter for closed fracture  Fall, initial encounter     Rx / DC Orders   ED Discharge Orders     None        Note:  This document was prepared using Dragon voice recognition software and may include unintentional dictation errors.   Malvina Alm DASEN, MD 08/10/23 MONIQUE    Malvina Alm DASEN,  MD 08/10/23 9761

## 2023-08-10 ENCOUNTER — Emergency Department

## 2023-08-10 DIAGNOSIS — A8039 Other acute paralytic poliomyelitis: Secondary | ICD-10-CM | POA: Diagnosis not present

## 2023-08-10 DIAGNOSIS — M84474A Pathological fracture, right foot, initial encounter for fracture: Secondary | ICD-10-CM | POA: Diagnosis not present

## 2023-08-10 DIAGNOSIS — M25571 Pain in right ankle and joints of right foot: Secondary | ICD-10-CM | POA: Diagnosis not present

## 2023-08-10 DIAGNOSIS — S82114A Nondisplaced fracture of right tibial spine, initial encounter for closed fracture: Secondary | ICD-10-CM | POA: Diagnosis present

## 2023-08-10 DIAGNOSIS — I10 Essential (primary) hypertension: Secondary | ICD-10-CM

## 2023-08-10 DIAGNOSIS — E785 Hyperlipidemia, unspecified: Secondary | ICD-10-CM | POA: Insufficient documentation

## 2023-08-10 DIAGNOSIS — S82201A Unspecified fracture of shaft of right tibia, initial encounter for closed fracture: Secondary | ICD-10-CM | POA: Diagnosis present

## 2023-08-10 DIAGNOSIS — M25551 Pain in right hip: Secondary | ICD-10-CM | POA: Diagnosis not present

## 2023-08-10 DIAGNOSIS — E876 Hypokalemia: Secondary | ICD-10-CM

## 2023-08-10 DIAGNOSIS — Z043 Encounter for examination and observation following other accident: Secondary | ICD-10-CM | POA: Diagnosis not present

## 2023-08-10 DIAGNOSIS — G20A1 Parkinson's disease without dyskinesia, without mention of fluctuations: Secondary | ICD-10-CM

## 2023-08-10 DIAGNOSIS — F419 Anxiety disorder, unspecified: Secondary | ICD-10-CM | POA: Diagnosis not present

## 2023-08-10 DIAGNOSIS — F32A Depression, unspecified: Secondary | ICD-10-CM | POA: Insufficient documentation

## 2023-08-10 DIAGNOSIS — S82234A Nondisplaced oblique fracture of shaft of right tibia, initial encounter for closed fracture: Secondary | ICD-10-CM | POA: Diagnosis not present

## 2023-08-10 DIAGNOSIS — M19071 Primary osteoarthritis, right ankle and foot: Secondary | ICD-10-CM | POA: Diagnosis not present

## 2023-08-10 DIAGNOSIS — M85871 Other specified disorders of bone density and structure, right ankle and foot: Secondary | ICD-10-CM | POA: Diagnosis not present

## 2023-08-10 DIAGNOSIS — W19XXXA Unspecified fall, initial encounter: Secondary | ICD-10-CM | POA: Diagnosis not present

## 2023-08-10 DIAGNOSIS — M79604 Pain in right leg: Secondary | ICD-10-CM | POA: Diagnosis not present

## 2023-08-10 LAB — CBC WITH DIFFERENTIAL/PLATELET
Abs Immature Granulocytes: 0.04 K/uL (ref 0.00–0.07)
Basophils Absolute: 0 K/uL (ref 0.0–0.1)
Basophils Relative: 0 %
Eosinophils Absolute: 0 K/uL (ref 0.0–0.5)
Eosinophils Relative: 0 %
HCT: 37.6 % (ref 36.0–46.0)
Hemoglobin: 12 g/dL (ref 12.0–15.0)
Immature Granulocytes: 0 %
Lymphocytes Relative: 12 %
Lymphs Abs: 1.4 K/uL (ref 0.7–4.0)
MCH: 28.2 pg (ref 26.0–34.0)
MCHC: 31.9 g/dL (ref 30.0–36.0)
MCV: 88.3 fL (ref 80.0–100.0)
Monocytes Absolute: 0.9 K/uL (ref 0.1–1.0)
Monocytes Relative: 8 %
Neutro Abs: 9.4 K/uL — ABNORMAL HIGH (ref 1.7–7.7)
Neutrophils Relative %: 80 %
Platelets: 313 K/uL (ref 150–400)
RBC: 4.26 MIL/uL (ref 3.87–5.11)
RDW: 14.4 % (ref 11.5–15.5)
WBC: 11.8 K/uL — ABNORMAL HIGH (ref 4.0–10.5)
nRBC: 0 % (ref 0.0–0.2)

## 2023-08-10 LAB — BASIC METABOLIC PANEL WITH GFR
Anion gap: 9 (ref 5–15)
Anion gap: 19 — ABNORMAL HIGH (ref 5–15)
BUN: 25 mg/dL — ABNORMAL HIGH (ref 8–23)
BUN: 27 mg/dL — ABNORMAL HIGH (ref 8–23)
CO2: 25 mmol/L (ref 22–32)
CO2: 25 mmol/L (ref 22–32)
Calcium: 9.3 mg/dL (ref 8.9–10.3)
Calcium: 9.5 mg/dL (ref 8.9–10.3)
Chloride: 104 mmol/L (ref 98–111)
Chloride: 102 mmol/L (ref 98–111)
Creatinine, Ser: 0.82 mg/dL (ref 0.44–1.00)
Creatinine, Ser: 0.75 mg/dL (ref 0.44–1.00)
GFR, Estimated: >60 mL/min (ref >60)
GFR, Estimated: >60 mL/min (ref >60)
Glucose, Bld: 111 mg/dL — ABNORMAL HIGH (ref 70–99)
Glucose, Bld: 104 mg/dL — ABNORMAL HIGH (ref 70–99)
Potassium: 3.3 mmol/L — ABNORMAL LOW (ref 3.5–5.1)
Potassium: 2.7 mmol/L — CL (ref 3.5–5.1)
Sodium: 138 mmol/L (ref 135–145)
Sodium: 138 mmol/L (ref 135–145)

## 2023-08-10 LAB — CBC
HCT: 33 % — ABNORMAL LOW (ref 36.0–46.0)
Hemoglobin: 10.5 g/dL — ABNORMAL LOW (ref 12.0–15.0)
MCH: 27.8 pg (ref 26.0–34.0)
MCHC: 31.8 g/dL (ref 30.0–36.0)
MCV: 87.3 fL (ref 80.0–100.0)
Platelets: 256 K/uL (ref 150–400)
RBC: 3.78 MIL/uL — ABNORMAL LOW (ref 3.87–5.11)
RDW: 14.2 % (ref 11.5–15.5)
WBC: 12.7 K/uL — ABNORMAL HIGH (ref 4.0–10.5)
nRBC: 0 % (ref 0.0–0.2)

## 2023-08-10 LAB — MAGNESIUM: Magnesium: 1.9 mg/dL (ref 1.7–2.4)

## 2023-08-10 MED ORDER — POTASSIUM CHLORIDE CRYS ER 20 MEQ PO TBCR
40.0000 meq | EXTENDED_RELEASE_TABLET | Freq: Once | ORAL | Status: AC
Start: 2023-08-10 — End: 2023-08-10
  Administered 2023-08-10: 40 meq via ORAL
  Filled 2023-08-10: qty 2

## 2023-08-10 MED ORDER — HYDROCODONE-ACETAMINOPHEN 10-325 MG PO TABS: 0.5000 | ORAL_TABLET | Freq: Three times a day (TID) | ORAL | Status: DC | PRN

## 2023-08-10 MED ORDER — TIZANIDINE HCL 4 MG PO TABS: 4.0000 mg | ORAL_TABLET | Freq: Three times a day (TID) | ORAL | Status: DC | PRN

## 2023-08-10 MED ORDER — POLYVINYL ALCOHOL 1.4 % OP SOLN: 1.0000 [drp] | Freq: Every day | OPHTHALMIC | Status: DC | PRN

## 2023-08-10 MED ORDER — POTASSIUM CHLORIDE 10 MEQ/100ML IV SOLN
10.0000 meq | INTRAVENOUS | Status: AC
  Administered 2023-08-10 (×3): 10 meq via INTRAVENOUS
  Filled 2023-08-10 (×4): qty 100

## 2023-08-10 MED ORDER — ONDANSETRON HCL 4 MG/2ML IJ SOLN: 4.0000 mg | Freq: Four times a day (QID) | INTRAMUSCULAR | Status: DC | PRN

## 2023-08-10 MED ORDER — MORPHINE SULFATE (PF) 2 MG/ML IV SOLN
2.0000 mg | INTRAVENOUS | Status: DC | PRN
  Administered 2023-08-10: 2 mg via INTRAVENOUS
  Filled 2023-08-10: qty 1

## 2023-08-10 MED ORDER — LORATADINE 10 MG PO TABS
10.0000 mg | ORAL_TABLET | Freq: Every day | ORAL | Status: DC
  Administered 2023-08-12: 10 mg via ORAL
  Filled 2023-08-10 (×3): qty 1

## 2023-08-10 MED ORDER — ONDANSETRON HCL 4 MG PO TABS: 4.0000 mg | ORAL_TABLET | Freq: Four times a day (QID) | ORAL | Status: DC | PRN

## 2023-08-10 MED ORDER — PANTOPRAZOLE SODIUM 40 MG PO TBEC
40.0000 mg | DELAYED_RELEASE_TABLET | Freq: Two times a day (BID) | ORAL | Status: DC
  Administered 2023-08-10 – 2023-08-12 (×5): 40 mg via ORAL
  Filled 2023-08-10 (×5): qty 1

## 2023-08-10 MED ORDER — VITAMIN B-12 1000 MCG PO TABS
1000.0000 ug | ORAL_TABLET | Freq: Every day | ORAL | Status: DC
  Administered 2023-08-10 – 2023-08-12 (×3): 1000 ug via ORAL
  Filled 2023-08-10 (×3): qty 1

## 2023-08-10 MED ORDER — LOSARTAN POTASSIUM 50 MG PO TABS
100.0000 mg | ORAL_TABLET | Freq: Every day | ORAL | Status: DC
  Administered 2023-08-10 – 2023-08-12 (×3): 100 mg via ORAL
  Filled 2023-08-10 (×3): qty 2

## 2023-08-10 MED ORDER — TRAZODONE HCL 50 MG PO TABS: 25.0000 mg | ORAL_TABLET | Freq: Every evening | ORAL | Status: DC | PRN

## 2023-08-10 MED ORDER — NEOMYCIN-POLYMYXIN-DEXAMETH 3.5-10000-0.1 OP SUSP
1.0000 [drp] | Freq: Four times a day (QID) | OPHTHALMIC | Status: DC
  Administered 2023-08-10 – 2023-08-12 (×9): 1 [drp] via OPHTHALMIC
  Filled 2023-08-10: qty 5

## 2023-08-10 MED ORDER — SODIUM CHLORIDE 0.9 % IV SOLN: INTRAVENOUS | Status: DC

## 2023-08-10 MED ORDER — ACETAMINOPHEN 325 MG PO TABS
650.0000 mg | ORAL_TABLET | Freq: Four times a day (QID) | ORAL | Status: DC | PRN
  Administered 2023-08-10 (×2): 650 mg via ORAL
  Filled 2023-08-10 (×2): qty 2

## 2023-08-10 MED ORDER — VITAMIN D 25 MCG (1000 UNIT) PO TABS
1000.0000 [IU] | ORAL_TABLET | Freq: Every day | ORAL | Status: DC
  Administered 2023-08-10 – 2023-08-12 (×3): 1000 [IU] via ORAL
  Filled 2023-08-10 (×3): qty 1

## 2023-08-10 MED ORDER — SIMVASTATIN 20 MG PO TABS
20.0000 mg | ORAL_TABLET | Freq: Every day | ORAL | Status: DC
  Administered 2023-08-10 – 2023-08-11 (×2): 20 mg via ORAL
  Filled 2023-08-10 (×2): qty 1

## 2023-08-10 MED ORDER — ROPINIROLE HCL 0.25 MG PO TABS
0.2500 mg | ORAL_TABLET | Freq: Every day | ORAL | Status: DC
  Administered 2023-08-10 – 2023-08-11 (×2): 0.25 mg via ORAL
  Filled 2023-08-10 (×2): qty 1

## 2023-08-10 MED ORDER — CARBIDOPA-LEVODOPA ER 25-100 MG PO TBCR
1.0000 | EXTENDED_RELEASE_TABLET | Freq: Three times a day (TID) | ORAL | Status: DC
  Administered 2023-08-10 – 2023-08-12 (×7): 1 via ORAL
  Filled 2023-08-10 (×8): qty 1

## 2023-08-10 MED ORDER — HYDROCODONE-ACETAMINOPHEN 5-325 MG PO TABS
1.0000 | ORAL_TABLET | ORAL | Status: DC | PRN
  Administered 2023-08-10 – 2023-08-11 (×2): 1 via ORAL
  Administered 2023-08-11: 2 via ORAL
  Administered 2023-08-12: 1 via ORAL
  Filled 2023-08-10: qty 1
  Filled 2023-08-10: qty 2
  Filled 2023-08-10 (×2): qty 1

## 2023-08-10 MED ORDER — ACETAMINOPHEN 650 MG RE SUPP: 650.0000 mg | Freq: Four times a day (QID) | RECTAL | Status: DC | PRN

## 2023-08-10 MED ORDER — BUPROPION HCL ER (XL) 150 MG PO TB24
150.0000 mg | ORAL_TABLET | Freq: Every day | ORAL | Status: DC
  Administered 2023-08-10 – 2023-08-12 (×3): 150 mg via ORAL
  Filled 2023-08-10 (×3): qty 1

## 2023-08-10 MED ORDER — ESCITALOPRAM OXALATE 10 MG PO TABS
20.0000 mg | ORAL_TABLET | Freq: Every day | ORAL | Status: DC
  Administered 2023-08-10 – 2023-08-12 (×3): 20 mg via ORAL
  Filled 2023-08-10 (×3): qty 2

## 2023-08-10 MED ORDER — POTASSIUM CHLORIDE CRYS ER 20 MEQ PO TBCR
40.0000 meq | EXTENDED_RELEASE_TABLET | Freq: Once | ORAL | Status: AC
  Administered 2023-08-10: 40 meq via ORAL
  Filled 2023-08-10: qty 2

## 2023-08-10 MED ORDER — OXYBUTYNIN CHLORIDE ER 5 MG PO TB24
20.0000 mg | ORAL_TABLET | Freq: Every day | ORAL | Status: DC
  Administered 2023-08-10 – 2023-08-12 (×3): 20 mg via ORAL
  Filled 2023-08-10 (×3): qty 4

## 2023-08-10 MED ORDER — MORPHINE SULFATE (PF) 2 MG/ML IV SOLN: 1.0000 mg | INTRAVENOUS | Status: DC | PRN

## 2023-08-10 MED ORDER — FLUTICASONE PROPIONATE 50 MCG/ACT NA SUSP: 1.0000 | Freq: Every evening | NASAL | Status: DC | PRN

## 2023-08-10 MED ORDER — METOPROLOL TARTRATE 25 MG PO TABS
25.0000 mg | ORAL_TABLET | Freq: Two times a day (BID) | ORAL | Status: DC
  Administered 2023-08-10 – 2023-08-12 (×5): 25 mg via ORAL
  Filled 2023-08-10 (×5): qty 1

## 2023-08-10 MED ORDER — ENOXAPARIN SODIUM 40 MG/0.4ML IJ SOSY
40.0000 mg | PREFILLED_SYRINGE | INTRAMUSCULAR | Status: DC
  Administered 2023-08-10 – 2023-08-12 (×3): 40 mg via SUBCUTANEOUS
  Filled 2023-08-10 (×3): qty 0.4

## 2023-08-10 MED ORDER — AMLODIPINE BESYLATE 5 MG PO TABS
2.5000 mg | ORAL_TABLET | Freq: Every day | ORAL | Status: DC
  Administered 2023-08-10 – 2023-08-12 (×3): 2.5 mg via ORAL
  Filled 2023-08-10 (×3): qty 1

## 2023-08-10 MED ORDER — FAMOTIDINE 20 MG PO TABS
40.0000 mg | ORAL_TABLET | Freq: Two times a day (BID) | ORAL | Status: DC
  Administered 2023-08-10 – 2023-08-12 (×5): 40 mg via ORAL
  Filled 2023-08-10 (×5): qty 2

## 2023-08-10 MED ORDER — MAGNESIUM HYDROXIDE 400 MG/5ML PO SUSP: 30.0000 mL | Freq: Every day | ORAL | Status: DC | PRN

## 2023-08-10 MED ORDER — POTASSIUM CHLORIDE 10 MEQ/100ML IV SOLN
10.0000 meq | Freq: Once | INTRAVENOUS | Status: AC
  Administered 2023-08-10: 10 meq via INTRAVENOUS
  Filled 2023-08-10: qty 100

## 2023-08-10 NOTE — Progress Notes (Signed)
[employer], no blood products

## 2023-08-10 NOTE — Assessment & Plan Note (Addendum)
 -  We will continue statin therapy.

## 2023-08-10 NOTE — Assessment & Plan Note (Signed)
-   Will continue Sinemet  CR.

## 2023-08-10 NOTE — TOC Progression Note (Signed)
 Transition of Care [facility]) - Progression Note    Patient Details  Name: CLEON THOMA MRN: [id number] Date of Birth: May 04, 1945  [facility] CM/SW Contact  Marinda Cooks, RN Phone Number: 08/10/2023, 5:36 PM  Clinical Narrative:     This CM spoke with pt and her husband bedside regarding dc planning & SNF work up recommended , informed there was no preference . T FL2 completed & PASSAR: 2 Referrals sent . Awaiting bed offers for choice TOC will cont to follow dc planning / care coordination and update as applicable.       Expected Discharge Plan and Services                                               Social Determinants of Health (SDOH) Interventions SDOH Screenings   Food Insecurity: No Food Insecurity (08/10/2023)  Housing: Low Risk  (08/10/2023)  Transportation Needs: No Transportation Needs (08/10/2023)  Utilities: Not At Risk (08/10/2023)  Alcohol  Screen: Low Risk  (09/25/2021)  Depression (PHQ2-9): High Risk (02/21/2022)  Financial Resource Strain: Low Risk  (08/07/2023)   Received from [facility]  Social Connections: Patient Unable To Answer (08/10/2023)  Tobacco Use: Medium Risk (08/09/2023)    Readmission Risk Interventions     No data to display

## 2023-08-10 NOTE — Assessment & Plan Note (Signed)
-   Potassium will be replaced and magnesium level will be checked. ?

## 2023-08-10 NOTE — Consult Note (Addendum)
 ORTHOPAEDIC CONSULTATION  REQUESTING PHYSICIAN: Awanda City, MD  Chief Complaint: Right leg pain  HPI: Susan Shah is a 78 y.o. female who complains of right leg pain after a fall yesterday evening at home.  Patient has a history of polio and Parkinson's disease.  She has used crutches for years.  She tripped and fell getting back to her bed and was unable to walk.  She was brought to the emergency room where exam and x-rays revealed a nondisplaced oblique fracture of the proximal third of the right tibia.  She has severe osteopenia due to her polio.  Patient was splinted and admitted.  She was noted to have severe hypokalemia and is unable to ambulate now due to the injury.  She will require skilled nursing care.  Her husband is at the bedside to contribute to the conversation.  Past Medical History:  Diagnosis Date   Anxiety    Asthma 2015   mild, seasonal allergy triggered.   Cancer (HCC) 2013   skin cancer  on left hand   Depression    Esophageal dysmotility    GERD (gastroesophageal reflux disease) 11/05/2012   History of hiatal hernia    Hyperlipidemia    Hypertension    Incontinence of urine    Lung nodule    OA (osteoarthritis)    Obesity    Parkinson's disease (HCC)    PONV (postoperative nausea and vomiting)    Post-polio syndrome    contracted at 44 months old   Pulmonary nodule 11/05/2012   RML   Shingles 2009   Sleep apnea    Past Surgical History:  Procedure Laterality Date   66 HOUR PH STUDY N/A 04/24/2016   Procedure: 24 HOUR PH STUDY;  Surgeon: Rogelia Copping, MD;  Location: [facility];  Service: Endoscopy;  Laterality: N/A;   ABDOMINAL HYSTERECTOMY     Total   APPENDECTOMY     CARPOMETACARPAL (CMC) FUSION OF THUMB Left 05/16/2016   Procedure: CARPOMETACARPAL [facility]) FUSION OF THUMB;  Surgeon: Ozell Flake, MD;  Location: [facility] ORS;  Service: Orthopedics;  Laterality: Left;   CARPOMETACARPAL (CMC) FUSION OF THUMB Left 03/30/2019   Procedure: LEFT THUMB  SUSPENSION PLASTY;  Surgeon: Flake Ozell, MD;  Location: [facility] ORS;  Service: Orthopedics;  Laterality: Left;   CATARACT EXTRACTION Bilateral 12/2012   CHOLECYSTECTOMY     COLONOSCOPY  2003   ESOPHAGEAL MANOMETRY N/A 04/24/2016   Procedure: ESOPHAGEAL MANOMETRY (EM);  Surgeon: Rogelia Copping, MD;  Location: [facility] ENDOSCOPY;  Service: Endoscopy;  Laterality: N/A;   ESOPHAGOGASTRODUODENOSCOPY  08/2011   FOOT SURGERY     HALLUX VALGUS CORRECTION     HIP SURGERY Right 1950   tendon release r/t polio   KNEE ARTHROSCOPY Left 06/23/2008   RETINAL DETACHMENT SURGERY Right 03/2014   ROTATOR CUFF REPAIR Bilateral    URINARY SURGERY  2014   [place]    Social History   Socioeconomic History   Marital status: Married    Spouse name: Not on file   Number of children: Not on file   Years of education: Not on file   Highest education level: Not on file  Occupational History   Not on file  Tobacco Use   Smoking status: Former    Current packs/day: 0.00    Types: Cigarettes    Quit date: 05/09/1968    Years since quitting: 55.2   Smokeless tobacco: Never  Vaping Use   Vaping status: Never Used  Substance and Sexual Activity   Alcohol   use: No   Drug use: No   Sexual activity: Not on file  Other Topics Concern   Not on file  Social History Narrative   Not on file   Social Drivers of Health   Financial Resource Strain: Low Risk  (08/07/2023)   Received from [facility]   Overall Financial Resource Strain (CARDIA)    Difficulty of Paying Living Expenses: Not hard at all  Food Insecurity: No Food Insecurity (08/10/2023)   Hunger Vital Sign    Worried About Running Out of Food in the Last Year: Never true    Ran Out of Food in the Last Year: Never true  Transportation Needs: No Transportation Needs (08/10/2023)   PRAPARE - Administrator, Civil Service (Medical): No    Lack of Transportation (Non-Medical): No  Physical Activity: Not on file  Stress: Not on  file  Social Connections: Patient Unable To Answer (08/10/2023)   Social Connection and Isolation Panel    Frequency of Communication with Friends and Family: Patient unable to answer    Frequency of Social Gatherings with Friends and Family: Patient unable to answer    Attends Religious Services: Patient unable to answer    Active Member of Clubs or Organizations: Patient unable to answer    Attends Banker Meetings: Patient unable to answer    Marital Status: Patient unable to answer   Family History  Problem Relation Age of Onset   Heart disease Mother    Heart disease Father    Heart attack Sister    Heart disease Brother    Heart attack Brother    Breast cancer Maternal Aunt    Stroke Maternal Grandmother    Heart disease Maternal Grandfather    Allergies  Allergen Reactions   Soma [Carisoprodol] Hives   Iodinated Contrast Media Rash   Iodine Rash   Macrobid  [Nitrofurantoin ] Nausea And Vomiting   Prior to Admission medications   Medication Sig Start Date End Date Taking? Authorizing Provider  acetaminophen  (TYLENOL ) 500 MG tablet Take 1,000 mg by mouth every 6 (six) hours as needed for moderate pain or headache.   Yes [provider]  amLODipine  (NORVASC ) 2.5 MG tablet TAKE 1 TABLET BY MOUTH EVERY DAY- STOP LISINOPRIL  03/25/23  Yes Abernathy, Alyssa, NP  buPROPion  (WELLBUTRIN  XL) 150 MG 24 hr tablet Take 1 tablet (150 mg total) by mouth daily. 06/23/23  Yes Abernathy, Mardy, NP  Carbidopa -Levodopa  ER (SINEMET  CR) 25-100 MG tablet controlled release Take 1 tablet by mouth 3 (three) times daily. 01/29/22 08/10/23 Yes [provider]  cholecalciferol  (VITAMIN D3) 25 MCG (1000 UNIT) tablet Take 1,000 Units by mouth daily.   Yes [provider]  escitalopram  (LEXAPRO ) 20 MG tablet Take 1 tablet (20 mg total) by mouth daily. 06/23/23  Yes Abernathy, Mardy, NP  famotidine  (PEPCID ) 40 MG tablet TAKE 1 TABLET(40 MG) BY MOUTH TWICE DAILY Patient taking  differently: Take 40 mg by mouth 2 (two) times daily as needed. 07/21/23  Yes Abernathy, Mardy, NP  fluticasone  (FLONASE ) 50 MCG/ACT nasal spray Place 1 spray into both nostrils at bedtime as needed for allergies or rhinitis. 11/12/22  Yes Abernathy, Mardy, NP  HYDROcodone -acetaminophen  (NORCO/VICODIN) 5-325 MG tablet Take 1 tablet by mouth every 6 (six) hours as needed. 10/15/21  Yes [provider]  loratadine  (QC LORATADINE  ALLERGY RELIEF) 10 MG tablet Take 1 tablet (10 mg total) by mouth daily. 01/08/19  Yes Boscia, Heather E, NP  losartan  (COZAAR )  100 MG tablet Take 1 tablet (100 mg total) by mouth daily. 06/23/23  Yes Abernathy, Mardy, NP  metoprolol  tartrate (LOPRESSOR ) 25 MG tablet TAKE 1 TABLET (25 MG TOTAL) BY MOUTH TWICE A DAY WITH MEALS 06/23/23  Yes Abernathy, Alyssa, NP  neomycin -polymyxin b -dexamethasone  (MAXITROL ) 3.5-10000-0.1 SUSP Place 1 drop into both eyes every 6 (six) hours. 08/01/23  Yes [provider]  ondansetron  (ZOFRAN -ODT) 4 MG disintegrating tablet Take 1 tablet (4 mg total) by mouth every 8 (eight) hours as needed for nausea or vomiting. 05/06/23  Yes Abernathy, Mardy, NP  oxybutynin  (DITROPAN -XL) 10 MG 24 hr tablet Take 2 tablets (20 mg total) by mouth daily. 06/23/23  Yes Abernathy, Mardy, NP  pantoprazole  (PROTONIX ) 40 MG tablet Take 1 tablet (40 mg total) by mouth 2 (two) times daily. 06/23/23  Yes Abernathy, Mardy, NP  predniSONE  (DELTASONE ) 10 MG tablet Take 10 mg by mouth 2 (two) times daily with a meal. 08/04/23 08/11/23 Yes [provider]  rOPINIRole  (REQUIP ) 0.25 MG tablet Take by mouth. 02/21/21  Yes [provider]  simvastatin  (ZOCOR ) 20 MG tablet TAKE 1 TABLET BY MOUTH DAILY AT 6 PM. 06/23/23  Yes Abernathy, Alyssa, NP  tiZANidine  (ZANAFLEX ) 4 MG tablet Take 4 mg by mouth 3 (three) times daily as needed. 08/04/23  Yes [provider]  vitamin B-12 (CYANOCOBALAMIN ) 1000 MCG tablet Take 1,000 mcg by mouth daily.   Yes [provider]  Carboxymethylcellul-Glycerin (LUBRICATING EYE DROPS OP) Place 1 drop into both eyes daily as needed (dry eyes).    [provider]  HYDROcodone -acetaminophen  (NORCO) 10-325 MG tablet Take 0.5 tablets by mouth every 8 (eight) hours as needed for moderate pain (pain score 4-6) or severe pain (pain score 7-10). 08/18/23   Liana Mardy, NP  HYDROcodone -acetaminophen  (NORCO) 10-325 MG tablet Take 0.5 tablets by mouth every 8 (eight) hours as needed for moderate pain (pain score 4-6) or severe pain (pain score 7-10). 07/21/23   Abernathy, Alyssa, NP  HYDROcodone -acetaminophen  (NORCO) 10-325 MG tablet Take 0.5 tablets by mouth every 8 (eight) hours as needed for moderate pain (pain score 4-6) or severe pain (pain score 7-10). 06/23/23   Liana Mardy, NP  potassium chloride  SA (KLOR-CON  M) 20 MEQ tablet Take 1 tablet (20 mEq total) by mouth daily. Patient not taking: Reported on 08/10/2023 06/23/23   Liana Mardy, NP   DG Foot Complete Right Result Date: 08/10/2023 CLINICAL DATA:  Status post fall. EXAM: RIGHT FOOT COMPLETE - 3+ VIEW COMPARISON:  None Available. FINDINGS: There is no evidence of an acute fracture or dislocation. A chronic fracture deformity is seen involving the proximal aspect of the first right metatarsal. Diffuse osteopenic changes are seen. Degenerative changes are noted, most prominently within the mid right foot. There is mild dorsal soft tissue swelling throughout the right foot. IMPRESSION: 1. Chronic fracture deformity of the proximal aspect of the first right metatarsal. 2. Dorsal soft tissue swelling without evidence of an acute osseous abnormality. Electronically Signed   By: Suzen Dials M.D.   On: 08/10/2023 01:11   DG Knee Complete 4 Views Right Result Date: 08/10/2023 EXAM: 4 VIEW(S) XRAY OF THE RIGHT KNEE 08/10/2023 12:29:28 AM COMPARISON: None available. CLINICAL HISTORY: Ground-level fall with pain in her right hip, right lower femur and  knee, and right lower leg and ankle. PER ER NOTE; Pt arrives [facility] from home for a fall with C/O pain in both legs (8/10). No LOC, didn't hit head, not on blood thinners. hx of  parkinson. ; ; 164/138; 86 bpm; 96 o2; 98.1; 100 CBG; BEST IMAGES OBTAINED DUE TO PATIENTS RIGHT LEG PARALYZED AT BASELINE PER PATIENT AND FAMILY FINDINGS: BONES AND JOINTS: Nondisplaced oblique proximal tibial shaft fracture. No joint dislocation. No significant joint effusion. No significant degenerative changes. SOFT TISSUES: The soft tissues are unremarkable. IMPRESSION: 1. Nondisplaced oblique proximal tibial shaft fracture. Electronically signed by: Pinkie Pebbles MD 08/10/2023 12:36 AM EDT RP Workstation: [id number]   DG Ankle Complete Right Result Date: 08/10/2023 EXAM: 3 or more VIEW(S) XRAY OF THE RIGHT ANKLE 08/10/2023 12:29:28 AM CLINICAL HISTORY: Ground-level fall with pain in her right hip, right lower femur and knee, and right lower leg and ankle. PER ER NOTE; Pt arrives [facility] from home for a fall with C/O pain in both legs (8/10). No LOC, didn't hit head, not on blood thinners. hx of parkinson. 164/138; 86 bpm; 96 o2; 98.1; 100 CBG; BEST IMAGES OBTAINED DUE TO PATIENTS RIGHT LEG PARALYZED AT BASELINE PER PATIENT AND FAMILY. COMPARISON: None available. FINDINGS: BONES AND JOINTS: No acute fracture. No focal osseous lesion. No joint dislocation. SOFT TISSUES: The soft tissues are unremarkable. IMPRESSION: 1. No acute osseous abnormality. Electronically signed by: Pinkie Pebbles MD 08/10/2023 12:35 AM EDT RP Workstation: [id number]   DG Hip Unilat W or Wo Pelvis 2-3 Views Right Result Date: 08/10/2023 EXAM: 3 VIEW(S) XRAY OF THE RIGHT HIP 08/10/2023 12:29:28 AM COMPARISON: None available. CLINICAL HISTORY: Ground-level fall with pain in her right hip, right lower femur and knee, and right lower leg and ankle. PER ER NOTE; Pt arrives [facility] from home for a fall with C/O pain in both legs (8/10). No LOC, didn't hit head, not  on blood thinners. hx of parkinson. ; ; 164/138; 86 bpm; 96 o2; 98.1; 100 CBG; BEST IMAGES OBTAINED DUE TO PATIENTS RIGHT LEG PARALYZED AT BASELINE PER PATIENT AND FAMILY FINDINGS: BONES AND JOINTS: No acute fracture. Shallow right acetabulum, chronic. Internal rotation of the right hip. SOFT TISSUES: The soft tissues are unremarkable. IMPRESSION: 1. No acute fracture or dislocation. 2. Shallow right acetabulum, chronic. Electronically signed by: Pinkie Pebbles MD 08/10/2023 12:35 AM EDT RP Workstation: [id number]    Positive ROS: All other systems have been reviewed and were otherwise negative with the exception of those mentioned in the HPI and as above.  Physical Exam: General: Alert, no acute distress Cardiovascular: No pedal edema Respiratory: No cyanosis, no use of accessory musculature GI: No organomegaly, abdomen is soft and non-tender Skin: No lesions in the area of chief complaint Neurologic: Sensation intact distally Psychiatric: Patient is competent for consent with normal mood and affect Lymphatic: No axillary or cervical lymphadenopathy  MUSCULOSKELETAL: Patient is alert and cooperative.  She is fully oriented.  Right leg is splinted.  Skin is intact.  Neurovascular status good distally.  She has some external rotation of the leg due to her polio.  Left lower extremity and the upper extremities are unremarkable today.  Spine is nontender.  Assessment: Nondisplaced proximal third right tibia fracture    Plan: Patient will remain in a long-leg splint. She will need skilled nursing care for a while. She is to remain nonweightbearing in his leg and will probably be unable to ambulate herself.  Dr. Ozell Flake is her usual orthopedic surgeon and she may follow-up with him for 1 week following discharge.    Kayla FORBES [place], MD [phone]   08/10/2023 12:19 PM

## 2023-08-10 NOTE — Assessment & Plan Note (Signed)
Will continue antihypertensive therapy.

## 2023-08-10 NOTE — Progress Notes (Signed)
 PT Cancellation Note  Patient Details Name: Susan Shah MRN: [id number] DOB: [birth year]   Cancelled Treatment:    Reason Eval/Treat Not Completed: Patient not medically ready. PT evaluation noted; however, pt admitted with a R tibial fx following a fall. Ortho consult has been placed. Awaiting their visit and recommendations. PT will continue to follow-up with this pt acutely as available and appropriate.   Delon HERO [name] 08/10/2023, 8:48 AM

## 2023-08-10 NOTE — Assessment & Plan Note (Signed)
-   The patient will be admitted to a medical telemetry bed. - Pain management will be provided. - The patient had a splint placed in the ER. - Orthopedic consult will be obtained. - Dr. Cleotilde was notified about the patient.

## 2023-08-10 NOTE — H&P (Signed)
 [place]   PATIENT NAME: Susan Shah    MR#:  [id number]  DATE OF BIRTH:  03-Apr-1945  DATE OF ADMISSION:  08/09/2023  PRIMARY CARE PHYSICIAN: Liana Fish, NP   Patient is coming from: Home  REQUESTING/REFERRING PHYSICIAN: Malvina Lenis, MD  CHIEF COMPLAINT:   Chief Complaint  Patient presents with   Fall    HISTORY OF PRESENT ILLNESS:  Susan Shah is a 78 y.o. Caucasian female with medical history significant for anxiety, asthma, depression, GERD, hypertension, dyslipidemia and OSA as well as Parkinson's disease, who presented to the emergency room with acute onset of fall which is getting back to her bathtub to her bed and slid from the fall on her right leg.  She denies any presyncope or syncope.  No paresthesias or focal muscle weakness.  No chest pain or palpitations.  No cough or wheezing or dyspnea.  No dysuria, oliguria or hematuria or flank pain.  No tinnitus or vertigo.  No bleeding diathesis.  She denies any fever or chills  ED Course: When she came to the ER, BP 173/79 with otherwise normal vital signs.  Labs revealed hypokalemia of 3.3 with a BUN of 25 with otherwise normal BMP.  CBC showed leukocytosis of 11.8 with neutrophilia.   EKG as reviewed by me : EKG showed sinus rhythm with rate of 68. Imaging: Right ankle x-ray revealed no acute osseous abnormality.  Right foot x-ray showed chronic fracture deformity of the proximal aspect of the first metatarsal on the right side and dorsal soft tissue swelling without evidence of acute osseous abnormality.  Right 4 view knee x-ray showed nondisplaced tubelike proximal tibial shaft fracture.  The patient was given 1 p.o. Percocet.  She will be admitted to a medical telemetry bed for further evaluation and management. PAST MEDICAL HISTORY:   Past Medical History:  Diagnosis Date   Anxiety    Asthma 2015   mild, seasonal allergy triggered.   Cancer (HCC) 2013   skin cancer  on left hand   Depression     Esophageal dysmotility    GERD (gastroesophageal reflux disease) 11/05/2012   History of hiatal hernia    Hyperlipidemia    Hypertension    Incontinence of urine    Lung nodule    OA (osteoarthritis)    Obesity    Parkinson's disease (HCC)    PONV (postoperative nausea and vomiting)    Post-polio syndrome    contracted at 31 months old   Pulmonary nodule 11/05/2012   RML   Shingles 2009   Sleep apnea     PAST SURGICAL HISTORY:   Past Surgical History:  Procedure Laterality Date   67 HOUR PH STUDY N/A 04/24/2016   Procedure: 24 HOUR PH STUDY;  Surgeon: Rogelia Copping, MD;  Location: [facility] ENDOSCOPY;  Service: Endoscopy;  Laterality: N/A;   ABDOMINAL HYSTERECTOMY     Total   APPENDECTOMY     CARPOMETACARPAL (CMC) FUSION OF THUMB Left 05/16/2016   Procedure: CARPOMETACARPAL [facility]) FUSION OF THUMB;  Surgeon: Ozell Flake, MD;  Location: [facility] ORS;  Service: Orthopedics;  Laterality: Left;   CARPOMETACARPAL (CMC) FUSION OF THUMB Left 03/30/2019   Procedure: LEFT THUMB SUSPENSION PLASTY;  Surgeon: Flake Ozell, MD;  Location: [facility] ORS;  Service: Orthopedics;  Laterality: Left;   CATARACT EXTRACTION Bilateral 12/2012   CHOLECYSTECTOMY     COLONOSCOPY  2003   ESOPHAGEAL MANOMETRY N/A 04/24/2016   Procedure: ESOPHAGEAL MANOMETRY (EM);  Surgeon: Rogelia Copping, MD;  Location: [facility] ENDOSCOPY;  Service: Endoscopy;  Laterality: N/A;   ESOPHAGOGASTRODUODENOSCOPY  08/2011   FOOT SURGERY     HALLUX VALGUS CORRECTION     HIP SURGERY Right 1950   tendon release r/t polio   KNEE ARTHROSCOPY Left 06/23/2008   RETINAL DETACHMENT SURGERY Right 03/2014   ROTATOR CUFF REPAIR Bilateral    URINARY SURGERY  2014   [place]     SOCIAL HISTORY:   Social History   Tobacco Use   Smoking status: Former    Current packs/day: 0.00    Types: Cigarettes    Quit date: 05/09/1968    Years since quitting: 55.2   Smokeless tobacco: Never  Substance Use Topics   Alcohol  use: No    FAMILY HISTORY:   Family  History  Problem Relation Age of Onset   Heart disease Mother    Heart disease Father    Heart attack Sister    Heart disease Brother    Heart attack Brother    Breast cancer Maternal Aunt    Stroke Maternal Grandmother    Heart disease Maternal Grandfather     DRUG ALLERGIES:   Allergies  Allergen Reactions   Soma [Carisoprodol] Hives   Iodinated Contrast Media Rash   Iodine Rash   Macrobid  [Nitrofurantoin ] Nausea And Vomiting    REVIEW OF SYSTEMS:   ROS As per history of present illness. All pertinent systems were reviewed above. Constitutional, HEENT, cardiovascular, respiratory, GI, GU, musculoskeletal, neuro, psychiatric, endocrine, integumentary and hematologic systems were reviewed and are otherwise negative/unremarkable except for positive findings mentioned above in the HPI.   MEDICATIONS AT HOME:   Prior to Admission medications   Medication Sig Start Date End Date Taking? Authorizing Provider  acetaminophen  (TYLENOL ) 500 MG tablet Take 1,000 mg by mouth every 6 (six) hours as needed for moderate pain or headache.   Yes [provider]  amLODipine  (NORVASC ) 2.5 MG tablet TAKE 1 TABLET BY MOUTH EVERY DAY- STOP LISINOPRIL  03/25/23  Yes Abernathy, Alyssa, NP  buPROPion  (WELLBUTRIN  XL) 150 MG 24 hr tablet Take 1 tablet (150 mg total) by mouth daily. 06/23/23  Yes Abernathy, Mardy, NP  Carbidopa -Levodopa  ER (SINEMET  CR) 25-100 MG tablet controlled release Take 1 tablet by mouth 3 (three) times daily. 01/29/22 08/10/23 Yes [provider]  cholecalciferol  (VITAMIN D3) 25 MCG (1000 UNIT) tablet Take 1,000 Units by mouth daily.   Yes [provider]  escitalopram  (LEXAPRO ) 20 MG tablet Take 1 tablet (20 mg total) by mouth daily. 06/23/23  Yes Abernathy, Alyssa, NP  famotidine  (PEPCID ) 40 MG tablet TAKE 1 TABLET(40 MG) BY MOUTH TWICE DAILY Patient taking differently: Take 40 mg by mouth 2 (two) times daily as needed. 07/21/23  Yes Abernathy, Mardy, NP   fluticasone  (FLONASE ) 50 MCG/ACT nasal spray Place 1 spray into both nostrils at bedtime as needed for allergies or rhinitis. 11/12/22  Yes Abernathy, Mardy, NP  HYDROcodone -acetaminophen  (NORCO/VICODIN) 5-325 MG tablet Take 1 tablet by mouth every 6 (six) hours as needed. 10/15/21  Yes [provider]  loratadine  (QC LORATADINE  ALLERGY RELIEF) 10 MG tablet Take 1 tablet (10 mg total) by mouth daily. 01/08/19  Yes Boscia, Powell BRAVO, NP  losartan  (COZAAR ) 100 MG tablet Take 1 tablet (100 mg total) by mouth daily. 06/23/23  Yes Abernathy, Mardy, NP  metoprolol  tartrate (LOPRESSOR ) 25 MG tablet TAKE 1 TABLET (25 MG TOTAL) BY MOUTH TWICE A DAY WITH MEALS 06/23/23  Yes Abernathy, Alyssa, NP  neomycin -polymyxin b -dexamethasone  (MAXITROL ) 3.5-10000-0.1 SUSP Place 1  drop into both eyes every 6 (six) hours. 08/01/23  Yes [provider]  ondansetron  (ZOFRAN -ODT) 4 MG disintegrating tablet Take 1 tablet (4 mg total) by mouth every 8 (eight) hours as needed for nausea or vomiting. 05/06/23  Yes Abernathy, Mardy, NP  oxybutynin  (DITROPAN -XL) 10 MG 24 hr tablet Take 2 tablets (20 mg total) by mouth daily. 06/23/23  Yes Abernathy, Mardy, NP  pantoprazole  (PROTONIX ) 40 MG tablet Take 1 tablet (40 mg total) by mouth 2 (two) times daily. 06/23/23  Yes Abernathy, Mardy, NP  predniSONE  (DELTASONE ) 10 MG tablet Take 10 mg by mouth 2 (two) times daily with a meal. 08/04/23 08/11/23 Yes [provider]  rOPINIRole  (REQUIP ) 0.25 MG tablet Take by mouth. 02/21/21  Yes [provider]  simvastatin  (ZOCOR ) 20 MG tablet TAKE 1 TABLET BY MOUTH DAILY AT 6 PM. 06/23/23  Yes Abernathy, Alyssa, NP  tiZANidine  (ZANAFLEX ) 4 MG tablet Take 4 mg by mouth 3 (three) times daily as needed. 08/04/23  Yes [provider]  vitamin B-12 (CYANOCOBALAMIN ) 1000 MCG tablet Take 1,000 mcg by mouth daily.   Yes [provider]  Carboxymethylcellul-Glycerin (LUBRICATING EYE DROPS OP) Place 1 drop into both  eyes daily as needed (dry eyes).    [provider]  HYDROcodone -acetaminophen  (NORCO) 10-325 MG tablet Take 0.5 tablets by mouth every 8 (eight) hours as needed for moderate pain (pain score 4-6) or severe pain (pain score 7-10). 08/18/23   Liana Mardy, NP  HYDROcodone -acetaminophen  (NORCO) 10-325 MG tablet Take 0.5 tablets by mouth every 8 (eight) hours as needed for moderate pain (pain score 4-6) or severe pain (pain score 7-10). 07/21/23   Abernathy, Alyssa, NP  HYDROcodone -acetaminophen  (NORCO) 10-325 MG tablet Take 0.5 tablets by mouth every 8 (eight) hours as needed for moderate pain (pain score 4-6) or severe pain (pain score 7-10). 06/23/23   Liana Mardy, NP  potassium chloride  SA (KLOR-CON  M) 20 MEQ tablet Take 1 tablet (20 mEq total) by mouth daily. Patient not taking: Reported on 08/10/2023 06/23/23   Liana Mardy, NP      VITAL SIGNS:  Blood pressure (!) 154/71, pulse 71, temperature 98.4 F (36.9 C), temperature source Oral, resp. rate 18, height 5' 2 (1.575 m), weight 65.8 kg, SpO2 99%.  PHYSICAL EXAMINATION:  Physical Exam  GENERAL:  78 y.o.-year-old Caucasian female patient lying in the bed with no acute distress.  EYES: Pupils equal, round, reactive to light and accommodation. No scleral icterus. Extraocular muscles intact.  HEENT: Head atraumatic, normocephalic. Oropharynx and nasopharynx clear.  NECK:  Supple, no jugular venous distention. No thyroid  enlargement, no tenderness.  LUNGS: Normal breath sounds bilaterally, no wheezing, rales,rhonchi or crepitation. No use of accessory muscles of respiration.  CARDIOVASCULAR: Regular rate and rhythm, S1, S2 normal. No murmurs, rubs, or gallops.  ABDOMEN: Soft, nondistended, nontender. Bowel sounds present. No organomegaly or mass.  EXTREMITIES: No pedal edema, cyanosis, or clubbing.  NEUROLOGIC: Cranial nerves II through XII are intact. Muscle strength 5/5 in all extremities. Sensation intact. Gait not  checked. Musculoskeletal: Right leg tenderness. PSYCHIATRIC: The patient is alert and oriented x 3.  Normal affect and good eye contact. SKIN: No obvious rash, lesion, or ulcer.   LABORATORY PANEL:   CBC Recent Labs  Lab 08/10/23 0155  WBC 11.8*  HGB 12.0  HCT 37.6  PLT 313   ------------------------------------------------------------------------------------------------------------------  Chemistries  Recent Labs  Lab 08/10/23 0155  NA 138  K 3.3*  CL 104  CO2 25  GLUCOSE 111*  BUN 25*  CREATININE 0.82  CALCIUM 9.3   ------------------------------------------------------------------------------------------------------------------  Cardiac Enzymes No results for input(s): TROPONINI in the last 168 hours. ------------------------------------------------------------------------------------------------------------------  RADIOLOGY:  DG Foot Complete Right Result Date: 08/10/2023 CLINICAL DATA:  Status post fall. EXAM: RIGHT FOOT COMPLETE - 3+ VIEW COMPARISON:  None Available. FINDINGS: There is no evidence of an acute fracture or dislocation. A chronic fracture deformity is seen involving the proximal aspect of the first right metatarsal. Diffuse osteopenic changes are seen. Degenerative changes are noted, most prominently within the mid right foot. There is mild dorsal soft tissue swelling throughout the right foot. IMPRESSION: 1. Chronic fracture deformity of the proximal aspect of the first right metatarsal. 2. Dorsal soft tissue swelling without evidence of an acute osseous abnormality. Electronically Signed   By: Suzen Dials M.D.   On: 08/10/2023 01:11   DG Knee Complete 4 Views Right Result Date: 08/10/2023 EXAM: 4 VIEW(S) XRAY OF THE RIGHT KNEE 08/10/2023 12:29:28 AM COMPARISON: None available. CLINICAL HISTORY: Ground-level fall with pain in her right hip, right lower femur and knee, and right lower leg and ankle. PER ER NOTE; Pt arrives [facility] from home for a  fall with C/O pain in both legs (8/10). No LOC, didn't hit head, not on blood thinners. hx of parkinson. ; ; 164/138; 86 bpm; 96 o2; 98.1; 100 CBG; BEST IMAGES OBTAINED DUE TO PATIENTS RIGHT LEG PARALYZED AT BASELINE PER PATIENT AND FAMILY FINDINGS: BONES AND JOINTS: Nondisplaced oblique proximal tibial shaft fracture. No joint dislocation. No significant joint effusion. No significant degenerative changes. SOFT TISSUES: The soft tissues are unremarkable. IMPRESSION: 1. Nondisplaced oblique proximal tibial shaft fracture. Electronically signed by: Pinkie Pebbles MD 08/10/2023 12:36 AM EDT RP Workstation: [id number]   DG Ankle Complete Right Result Date: 08/10/2023 EXAM: 3 or more VIEW(S) XRAY OF THE RIGHT ANKLE 08/10/2023 12:29:28 AM CLINICAL HISTORY: Ground-level fall with pain in her right hip, right lower femur and knee, and right lower leg and ankle. PER ER NOTE; Pt arrives [facility] from home for a fall with C/O pain in both legs (8/10). No LOC, didn't hit head, not on blood thinners. hx of parkinson. 164/138; 86 bpm; 96 o2; 98.1; 100 CBG; BEST IMAGES OBTAINED DUE TO PATIENTS RIGHT LEG PARALYZED AT BASELINE PER PATIENT AND FAMILY. COMPARISON: None available. FINDINGS: BONES AND JOINTS: No acute fracture. No focal osseous lesion. No joint dislocation. SOFT TISSUES: The soft tissues are unremarkable. IMPRESSION: 1. No acute osseous abnormality. Electronically signed by: Pinkie Pebbles MD 08/10/2023 12:35 AM EDT RP Workstation: [id number]   DG Hip Unilat W or Wo Pelvis 2-3 Views Right Result Date: 08/10/2023 EXAM: 3 VIEW(S) XRAY OF THE RIGHT HIP 08/10/2023 12:29:28 AM COMPARISON: None available. CLINICAL HISTORY: Ground-level fall with pain in her right hip, right lower femur and knee, and right lower leg and ankle. PER ER NOTE; Pt arrives [facility] from home for a fall with C/O pain in both legs (8/10). No LOC, didn't hit head, not on blood thinners. hx of parkinson. ; ; 164/138; 86 bpm; 96 o2; 98.1; 100 CBG;  BEST IMAGES OBTAINED DUE TO PATIENTS RIGHT LEG PARALYZED AT BASELINE PER PATIENT AND FAMILY FINDINGS: BONES AND JOINTS: No acute fracture. Shallow right acetabulum, chronic. Internal rotation of the right hip. SOFT TISSUES: The soft tissues are unremarkable. IMPRESSION: 1. No acute fracture or dislocation. 2. Shallow right acetabulum, chronic. Electronically signed by: Pinkie Pebbles MD 08/10/2023 12:35 AM EDT RP Workstation: [id number]      IMPRESSION AND PLAN:  Assessment  and Plan: * Nondisplaced fracture of right tibial spine, initial encounter for closed fracture - The patient will be admitted to a medical telemetry bed. - Pain management will be provided. - The patient had a splint placed in the ER. - Orthopedic consult will be obtained. - Dr. Cleotilde was notified about the patient.  Hypokalemia Potassium will be replaced and magnesium  level will be checked.  Dyslipidemia - We will continue statin therapy.  Anxiety and depression - Will continue Wellbutrin  XL and and Lexapro .  Parkinson's disease (HCC) - Will continue Sinemet  CR.  Essential hypertension - Will continue antihypertensive therapy.       DVT prophylaxis: Lovenox .  Advanced Care Planning:  Code Status: full code.  Family Communication:  The plan of care was discussed in details with the patient (and family). I answered all questions. The patient agreed to proceed with the above mentioned plan. Further management will depend upon hospital course. Disposition Plan: Back to previous home environment Consults called: Orthopedic consult All the records are reviewed and case discussed with ED provider.  Status is: Inpatient  At the time of the admission, it appears that the appropriate admission status for this patient is inpatient.  This is judged to be reasonable and necessary in order to provide the required intensity of service to ensure the patient's safety given the presenting symptoms, physical exam  findings and initial radiographic and laboratory data in the context of comorbid conditions.  The patient requires inpatient status due to high intensity of service, high risk of further deterioration and high frequency of surveillance required.  I certify that at the time of admission, it is my clinical judgment that the patient will require inpatient hospital care extending more than 2 midnights.                            Dispo: The patient is from: Home              Anticipated d/c is to: Home              Patient currently is not medically stable to d/c.              Difficult to place patient: No  Madison DELENA Peaches M.D on 08/10/2023 at 4:55 AM  [facility]   From 7 PM-7 AM, contact night-coverage [email or web address]  CC: Primary care physician; Liana Fish, NP

## 2023-08-10 NOTE — Assessment & Plan Note (Signed)
-   Will continue Wellbutrin  XL and and Lexapro .

## 2023-08-10 NOTE — NC FL2 (Signed)
 [place]  MEDICAID FL2 LEVEL OF CARE FORM     IDENTIFICATION  Patient Name: Susan Shah Birthdate: Aug 20, 1945 Sex: female Admission Date (Current Location): 08/09/2023  [facility] [place] Number:  Chiropodist and Address:  [facility], [address], [place], [place] [address]      Provider Number: [id number]  Attending Physician Name and Address:  Awanda City, MD  Relative Name and Phone Number:  MALENI SEYER  [facility]  Emergency Contact  [phone]    Current Level of Care: Hospital Recommended Level of Care: Skilled Nursing Facility Prior Approval Number:    Date Approved/Denied:   PASRR Number: [id number]  Discharge Plan: SNF    Current Diagnoses: Patient Active Problem List   Diagnosis Date Noted   Nondisplaced fracture of right tibial spine, initial encounter for closed fracture 08/10/2023   Parkinson's disease (HCC) 08/10/2023   Anxiety and depression 08/10/2023   Dyslipidemia 08/10/2023   Right tibial fracture 08/10/2023   Other hypersomnia 07/04/2020   Encounter for general adult medical examination with abnormal findings 01/07/2020   Benign hypertension 01/07/2020   Parkinson disease (HCC) 01/07/2020   At high risk for falls 01/07/2020   Abnormality of gait and mobility 01/07/2020   Urinary tract infection with hematuria 10/03/2019   Non-intractable vomiting 10/03/2019   Dysuria 10/03/2019   TMJ (temporomandibular joint disorder) 07/21/2019   Generalized weakness 07/21/2019   OSA on CPAP 05/02/2019   Cough due to ACE inhibitor 07/10/2018   Other fatigue 02/16/2018   Vertigo 02/08/2018   Screening for breast cancer 12/17/2017   Flu vaccine need 10/30/2017   Need for vaccination against Streptococcus pneumoniae using pneumococcal conjugate vaccine 13 10/30/2017   Hypokalemia 08/28/2017   Absolute anemia 08/28/2017   Benign essential tremor 08/28/2017   Chest pain 08/25/2017   H/O aneurysm 07/15/2017    Monoplegia of right leg (HCC) 07/15/2017   Depression 03/23/2017   Hiatal hernia with gastroesophageal reflux 07/30/2016   Hiatal hernia 04/11/2016   Anxiety    OA (osteoarthritis)    Hyperlipidemia    Essential hypertension    Post-polio syndrome    Lung nodule    Esophageal dysmotility    Incontinence of urine    Obesity    GERD (gastroesophageal reflux disease) 11/05/2012   Pulmonary nodule 11/05/2012   Shingles 01/22/2007    Orientation RESPIRATION BLADDER Height & Weight     Self, Time, Situation, Place  Normal   Weight: 65.8 kg Height:  5' 2 (157.5 cm)  BEHAVIORAL SYMPTOMS/MOOD NEUROLOGICAL BOWEL NUTRITION STATUS     (Pt has Parkison)   Diet  AMBULATORY STATUS COMMUNICATION OF NEEDS Skin   Extensive Assist Verbally Normal                       Personal Care Assistance Level of Assistance  Bathing, Dressing Bathing Assistance: Limited assistance   Dressing Assistance: Limited assistance     Functional Limitations Info             SPECIAL CARE FACTORS FREQUENCY  PT (By licensed PT), OT (By licensed OT)     PT Frequency: 5x a wk OT Frequency: 5 x a wk            Contractures      Additional Factors Info  Code Status Code Status Info: Full             Current Medications (08/10/2023):  This is the current hospital active medication  list Current Facility-Administered Medications  Medication Dose Route Frequency Provider Last Rate Last Admin   acetaminophen  (TYLENOL ) tablet 650 mg  650 mg Oral Q6H PRN Mansy, Jan A, MD   650 mg at 08/10/23 1706   Or   acetaminophen  (TYLENOL ) suppository 650 mg  650 mg Rectal Q6H PRN Mansy, Jan A, MD       amLODipine  (NORVASC ) tablet 2.5 mg  2.5 mg Oral Daily Mansy, Jan A, MD   2.5 mg at 08/10/23 9043   artificial tears ophthalmic solution 1 drop  1 drop Both Eyes Daily PRN Mansy, Jan A, MD       buPROPion  (WELLBUTRIN  XL) 24 hr tablet 150 mg  150 mg Oral Daily Mansy, Jan A, MD   150 mg at 08/10/23 9043    Carbidopa -Levodopa  ER (SINEMET  CR) 25-100 MG tablet controlled release 1 tablet  1 tablet Oral TID Mansy, Jan A, MD   1 tablet at 08/10/23 1532   cholecalciferol  (VITAMIN D3) 25 MCG (1000 UNIT) tablet 1,000 Units  1,000 Units Oral Daily Mansy, Jan A, MD   1,000 Units at 08/10/23 9043   cyanocobalamin  (VITAMIN B12) tablet 1,000 mcg  1,000 mcg Oral Daily Mansy, Jan A, MD   1,000 mcg at 08/10/23 9043   enoxaparin  (LOVENOX ) injection 40 mg  40 mg Subcutaneous Q24H Mansy, Jan A, MD   40 mg at 08/10/23 9245   escitalopram  (LEXAPRO ) tablet 20 mg  20 mg Oral Daily Mansy, Jan A, MD   20 mg at 08/10/23 9044   famotidine  (PEPCID ) tablet 40 mg  40 mg Oral BID Mansy, Jan A, MD   40 mg at 08/10/23 9044   fluticasone  (FLONASE ) 50 MCG/ACT nasal spray 1 spray  1 spray Each Nare QHS PRN Mansy, Madison LABOR, MD       HYDROcodone -acetaminophen  (NORCO/VICODIN) 5-325 MG per tablet 1-2 tablet  1-2 tablet Oral Q4H PRN Awanda City, MD       loratadine  (CLARITIN ) tablet 10 mg  10 mg Oral Daily Mansy, Jan A, MD       losartan  (COZAAR ) tablet 100 mg  100 mg Oral Daily Awanda City, MD   100 mg at 08/10/23 1706   magnesium  hydroxide (MILK OF MAGNESIA) suspension 30 mL  30 mL Oral Daily PRN Mansy, Jan A, MD       metoprolol  tartrate (LOPRESSOR ) tablet 25 mg  25 mg Oral BID Mansy, Jan A, MD   25 mg at 08/10/23 9043   morphine  (PF) 2 MG/ML injection 1 mg  1 mg Intravenous Q4H PRN Awanda City, MD       neomycin -polymyxin b -dexamethasone  (MAXITROL ) ophthalmic suspension 1 drop  1 drop Both Eyes Q6H Mansy, Jan A, MD   1 drop at 08/10/23 1707   ondansetron  (ZOFRAN ) tablet 4 mg  4 mg Oral Q6H PRN Mansy, Jan A, MD       Or   ondansetron  (ZOFRAN ) injection 4 mg  4 mg Intravenous Q6H PRN Mansy, Jan A, MD       oxybutynin  (DITROPAN -XL) 24 hr tablet 20 mg  20 mg Oral Daily Mansy, Jan A, MD   20 mg at 08/10/23 0955   pantoprazole  (PROTONIX ) EC tablet 40 mg  40 mg Oral BID Mansy, Jan A, MD   40 mg at 08/10/23 9043   rOPINIRole  (REQUIP ) tablet 0.25 mg   0.25 mg Oral QHS Mansy, Jan A, MD       simvastatin  (ZOCOR ) tablet 20 mg  20 mg Oral q1800 Mansy, Jan  A, MD   20 mg at 08/10/23 1706   tiZANidine  (ZANAFLEX ) tablet 4 mg  4 mg Oral TID PRN Mansy, Jan A, MD       traZODone  (DESYREL ) tablet 25 mg  25 mg Oral QHS PRN Mansy, Madison LABOR, MD         Discharge Medications: Please see discharge summary for a list of discharge medications.  Relevant Imaging Results:  Relevant Lab Results:   Additional Information SS#[id number]  Marinda Cooks, RN

## 2023-08-11 DIAGNOSIS — S82114A Nondisplaced fracture of right tibial spine, initial encounter for closed fracture: Secondary | ICD-10-CM | POA: Diagnosis not present

## 2023-08-11 LAB — CBC
HCT: 34.6 % — ABNORMAL LOW (ref 36.0–46.0)
Hemoglobin: 10.5 g/dL — ABNORMAL LOW (ref 12.0–15.0)
MCH: 27.8 pg (ref 26.0–34.0)
MCHC: 30.3 g/dL (ref 30.0–36.0)
MCV: 91.5 fL (ref 80.0–100.0)
Platelets: 251 K/uL (ref 150–400)
RBC: 3.78 MIL/uL — ABNORMAL LOW (ref 3.87–5.11)
RDW: 14.6 % (ref 11.5–15.5)
WBC: 8.3 K/uL (ref 4.0–10.5)
nRBC: 0 % (ref 0.0–0.2)

## 2023-08-11 LAB — BASIC METABOLIC PANEL WITH GFR
Anion gap: 10 (ref 5–15)
BUN: 22 mg/dL (ref 8–23)
CO2: 24 mmol/L (ref 22–32)
Calcium: 8.9 mg/dL (ref 8.9–10.3)
Chloride: 108 mmol/L (ref 98–111)
Creatinine, Ser: 0.73 mg/dL (ref 0.44–1.00)
GFR, Estimated: >60 mL/min (ref >60)
Glucose, Bld: 96 mg/dL (ref 70–99)
Potassium: 4.8 mmol/L (ref 3.5–5.1)
Sodium: 142 mmol/L (ref 135–145)

## 2023-08-11 LAB — MAGNESIUM: Magnesium: 2 mg/dL (ref 1.7–2.4)

## 2023-08-11 NOTE — Plan of Care (Signed)
   Problem: Education: Goal: Knowledge of General Education information will improve Description Including pain rating scale, medication(s)/side effects and non-pharmacologic comfort measures Outcome: Progressing   Problem: Health Behavior/Discharge Planning: Goal: Ability to manage health-related needs will improve Outcome: Progressing

## 2023-08-11 NOTE — Care Management Obs Status (Signed)
 MEDICARE OBSERVATION STATUS NOTIFICATION   Patient Details  Name: Susan Shah MRN: [id number] Date of Birth: May 17, 1945   Medicare Observation Status Notification Given:  Yes    Victoire Deans W, CMA 08/11/2023, 11:25 AM

## 2023-08-11 NOTE — Evaluation (Signed)
 Physical Therapy Evaluation Patient Details Name: Susan Shah MRN: [id number] DOB: [birth year] Today's Date: 08/11/2023  History of Present Illness  Susan Shah is a 78 y.o. Caucasian female with medical history significant for anxiety, asthma, depression, GERD, hypertension, dyslipidemia and OSA as well as Parkinson's disease, who presented to the emergency room with acute onset of fall which is getting back to her bed from the bathtub and slid and fell onto the R LE.  Pt with nondisplaced fracture of the R tibial spine and is non weight bearing on the R LE.    Clinical Impression  Pt received in Semi-Fowler's position and agreeable to therapy.  Pt accompanied by DIL in room during the session.  Pt had family member bring in crutches in order to attempt mobility today.  Pt performed well considering the R LE being immobilized, and required modA in order to come to a seated position at the EOB.  Pt's current comorbidities also contributing to difficulty with transfers and mobility, specifically the Parkinson's disease and the polio.  Pt ultimately able to come into standing with modA from therapist and assistance with stabilizing the crutches in order to stand.  Pt stood for several minutes and was able to side step utilizing the crutches before returning to the bed.  Pt left with all needs met and call bell within reach.  Anticipate transfer to recliner and OT order placed for assistance with UE strengthening necessary for transfers and mobility in future sessions.         If plan is discharge home, recommend the following: Two people to help with walking and/or transfers;Two people to help with bathing/dressing/bathroom;Help with stairs or ramp for entrance;Assist for transportation   Can travel by private vehicle   No    Equipment Recommendations None recommended by PT  Recommendations for Other Services       Functional Status Assessment Patient has had a recent decline in their  functional status and demonstrates the ability to make significant improvements in function in a reasonable and predictable amount of time.     Precautions / Restrictions Restrictions Weight Bearing Restrictions Per Provider Order: Yes RLE Weight Bearing Per Provider Order: Non weight bearing      Mobility  Bed Mobility Overal bed mobility: Needs Assistance Bed Mobility: Supine to Sit, Sit to Supine     Supine to sit: Mod assist, HOB elevated, Used rails Sit to supine: Mod assist   General bed mobility comments: modA for HHA in order to come upright into sitting; modA for transferring LE's back into the bed.    Transfers Overall transfer level: Needs assistance Equipment used: Crutches Transfers: Sit to/from Stand Sit to Stand: Min assist, Mod assist           General transfer comment: minA-modA to stabilize pt and crutches in order for pt to be able to stand safely.    Ambulation/Gait               General Gait Details: deferred until pt is able to be seen by +2 with chair follow at minimum.  Stairs            Wheelchair Mobility     Tilt Bed    Modified Rankin (Stroke Patients Only)       Balance  Pertinent Vitals/Pain Pain Assessment Pain Assessment: 0-10 Pain Score: 5  Pain Location: R Knee Pain Descriptors / Indicators: Dull Pain Intervention(s): Monitored during session, Repositioned    Home Living Family/patient expects to be discharged to:: Skilled nursing facility Living Arrangements: Spouse/significant other                      Prior Function Prior Level of Function : Independent/Modified Independent               ADLs Comments: Cooking/Cleaning.     Extremity/Trunk Assessment   Upper Extremity Assessment Upper Extremity Assessment: Generalized weakness    Lower Extremity Assessment Lower Extremity Assessment: RLE deficits/detail RLE  Deficits / Details: Unable to fully assess. RLE: Unable to fully assess due to immobilization       Communication   Communication Communication: No apparent difficulties    Cognition Arousal: Alert Behavior During Therapy: WFL for tasks assessed/performed   PT - Cognitive impairments: No apparent impairments                                 Cueing       General Comments      Exercises     Assessment/Plan    PT Assessment Patient needs continued PT services  PT Problem List Decreased strength;Decreased activity tolerance;Decreased balance;Decreased mobility;Decreased knowledge of use of DME;Decreased safety awareness       PT Treatment Interventions DME instruction;Gait training;Functional mobility training;Therapeutic activities;Therapeutic exercise;Balance training;Neuromuscular re-education;Patient/family education    PT Goals (Current goals can be found in the Care Plan section)  Acute Rehab PT Goals Patient Stated Goal: to go to rehab and get back home following. PT Goal Formulation: With patient Time For Goal Achievement: 08/25/23 Potential to Achieve Goals: Good    Frequency Min 3X/week     Co-evaluation               AM-PAC PT 6 Clicks Mobility  Outcome Measure Help needed turning from your back to your side while in a flat bed without using bedrails?: A Lot Help needed moving from lying on your back to sitting on the side of a flat bed without using bedrails?: A Lot Help needed moving to and from a bed to a chair (including a wheelchair)?: Total Help needed standing up from a chair using your arms (e.g., wheelchair or bedside chair)?: Total Help needed to walk in hospital room?: Total Help needed climbing 3-5 steps with a railing? : Total 6 Click Score: 8    End of Session   Activity Tolerance: Patient tolerated treatment well Patient left: in bed;with call bell/phone within reach Nurse Communication: Mobility status PT Visit  Diagnosis: Unsteadiness on feet (R26.81);Other abnormalities of gait and mobility (R26.89);Repeated falls (R29.6);Muscle weakness (generalized) (M62.81);History of falling (Z91.81);Difficulty in walking, not elsewhere classified (R26.2);Pain Pain - Right/Left: Right Pain - part of body: Leg    Time: 8467-8446 PT Time Calculation (min) (ACUTE ONLY): 21 min   Charges:   PT Evaluation $PT Eval Moderate Complexity: 1 Mod   PT General Charges $$ ACUTE PT VISIT: 1 Visit         Fonda Simpers, PT, DPT Physical Therapist - [facility]  08/11/23, 5:38 PM

## 2023-08-11 NOTE — Progress Notes (Signed)
  PROGRESS NOTE    Susan Shah  FMW:[id number] DOB: 09/30/1945 DOA: 08/09/2023 PCP: Liana Fish, NP  139A/139A-BB  LOS: 1 day   Brief hospital course:   Assessment & Plan: Susan Shah is a 78 y.o. Caucasian female with medical history significant for anxiety, asthma, depression, GERD, hypertension, dyslipidemia and OSA as well as Parkinson's disease, who presented to the emergency room with acute onset of fall which is getting back to her bathtub to her bed and slid from the fall on her right leg.     * Nondisplaced fracture of right tibial spine, initial encounter for closed fracture - ortho consulted with Dr. Cleotilde --cont long-leg splint --nonweightbearing  --f/u with pt's outpatient ortho Dr. Kathlynn 1 week after discharge.  Hypokalemia --monitor and supplement PRN  Dyslipidemia - We will continue statin therapy.  Anxiety and depression - Will continue Wellbutrin  XL and and Lexapro .  Parkinson's disease (HCC) --cont Sinemet   Essential hypertension --cont amlodipine , losartan , lopressor    DVT prophylaxis: Lovenox  SQ Code Status: Full code  Family Communication:  Level of care: Med-Surg Dispo:   The patient is from: home Anticipated d/c is to: SNF rehab Anticipated d/c date is: whenever SNF accepts   Subjective and Interval History:  Pain controlled.  Pt had no complaint.   Objective: Vitals:   08/10/23 2001 08/11/23 0312 08/11/23 0730 08/11/23 1453  BP: 120/64 (!) 144/63 (!) 161/81 (!) 141/63  Pulse: 66 67 70 67  Resp: 17 17 16 15   Temp: 98.9 F (37.2 C)  98 F (36.7 C) 98.6 F (37 C)  TempSrc: Oral     SpO2: 96% 98% 97% 94%  Weight:      Height:        Intake/Output Summary (Last 24 hours) at 08/11/2023 1703 Last data filed at 08/11/2023 0932 Gross per 24 hour  Intake 440 ml  Output --  Net 440 ml   Filed Weights   08/09/23 2319  Weight: 65.8 kg    Examination:   Constitutional: NAD, AAOx3 HEENT: conjunctivae and lids normal,  EOMI CV: No cyanosis.   RESP: normal respiratory effort, on RA Extremities: RLE in splint. Psych: Normal mood and affect.  Appropriate judgement and reason   Data Reviewed: I have personally reviewed labs and imaging studies  Time spent: 35 minutes  Ellouise Haber, MD [facility] If 7PM-7AM, please contact night-coverage 08/11/2023, 5:03 PM

## 2023-08-11 NOTE — Progress Notes (Signed)
 PT Cancellation Note  Patient Details Name: Susan Shah MRN: [id number] DOB: 05-23-1945   Cancelled Treatment:    Reason Eval/Treat Not Completed: Other (comment).  Chart reviewed and attempted to see the pt, however she is eating lunch and has visitors.  Pt politely requesting for therapy to come back at later time.  Will re-attempt to see at later date/time as medically appropriate.   Fonda Simpers, PT, DPT Physical Therapist - [facility]  08/11/23, 1:26 PM

## 2023-08-12 DIAGNOSIS — F419 Anxiety disorder, unspecified: Secondary | ICD-10-CM | POA: Diagnosis not present

## 2023-08-12 DIAGNOSIS — Z87891 Personal history of nicotine dependence: Secondary | ICD-10-CM | POA: Diagnosis not present

## 2023-08-12 DIAGNOSIS — F418 Other specified anxiety disorders: Secondary | ICD-10-CM | POA: Diagnosis not present

## 2023-08-12 DIAGNOSIS — W010XXA Fall on same level from slipping, tripping and stumbling without subsequent striking against object, initial encounter: Secondary | ICD-10-CM | POA: Diagnosis not present

## 2023-08-12 DIAGNOSIS — R509 Fever, unspecified: Secondary | ICD-10-CM | POA: Diagnosis not present

## 2023-08-12 DIAGNOSIS — F3340 Major depressive disorder, recurrent, in remission, unspecified: Secondary | ICD-10-CM | POA: Diagnosis not present

## 2023-08-12 DIAGNOSIS — F32A Depression, unspecified: Secondary | ICD-10-CM | POA: Diagnosis not present

## 2023-08-12 DIAGNOSIS — R531 Weakness: Secondary | ICD-10-CM | POA: Diagnosis not present

## 2023-08-12 DIAGNOSIS — G4733 Obstructive sleep apnea (adult) (pediatric): Secondary | ICD-10-CM | POA: Diagnosis not present

## 2023-08-12 DIAGNOSIS — S82101A Unspecified fracture of upper end of right tibia, initial encounter for closed fracture: Secondary | ICD-10-CM | POA: Diagnosis not present

## 2023-08-12 DIAGNOSIS — F411 Generalized anxiety disorder: Secondary | ICD-10-CM | POA: Diagnosis not present

## 2023-08-12 DIAGNOSIS — G20A1 Parkinson's disease without dyskinesia, without mention of fluctuations: Secondary | ICD-10-CM | POA: Diagnosis not present

## 2023-08-12 DIAGNOSIS — S82111D Displaced fracture of right tibial spine, subsequent encounter for closed fracture with routine healing: Secondary | ICD-10-CM | POA: Diagnosis not present

## 2023-08-12 DIAGNOSIS — E876 Hypokalemia: Secondary | ICD-10-CM | POA: Diagnosis not present

## 2023-08-12 DIAGNOSIS — Z7401 Bed confinement status: Secondary | ICD-10-CM | POA: Diagnosis not present

## 2023-08-12 DIAGNOSIS — E785 Hyperlipidemia, unspecified: Secondary | ICD-10-CM | POA: Diagnosis not present

## 2023-08-12 DIAGNOSIS — S82114D Nondisplaced fracture of right tibial spine, subsequent encounter for closed fracture with routine healing: Secondary | ICD-10-CM | POA: Diagnosis not present

## 2023-08-12 DIAGNOSIS — Z9181 History of falling: Secondary | ICD-10-CM | POA: Diagnosis not present

## 2023-08-12 DIAGNOSIS — W19XXXD Unspecified fall, subsequent encounter: Secondary | ICD-10-CM | POA: Diagnosis not present

## 2023-08-12 DIAGNOSIS — J45909 Unspecified asthma, uncomplicated: Secondary | ICD-10-CM | POA: Diagnosis not present

## 2023-08-12 DIAGNOSIS — S82114A Nondisplaced fracture of right tibial spine, initial encounter for closed fracture: Secondary | ICD-10-CM | POA: Diagnosis not present

## 2023-08-12 DIAGNOSIS — I10 Essential (primary) hypertension: Secondary | ICD-10-CM | POA: Diagnosis not present

## 2023-08-12 DIAGNOSIS — S82111A Displaced fracture of right tibial spine, initial encounter for closed fracture: Secondary | ICD-10-CM | POA: Diagnosis not present

## 2023-08-12 DIAGNOSIS — S82201A Unspecified fracture of shaft of right tibia, initial encounter for closed fracture: Secondary | ICD-10-CM | POA: Diagnosis not present

## 2023-08-12 MED ORDER — ENOXAPARIN SODIUM 40 MG/0.4ML IJ SOSY: 40.0000 mg | PREFILLED_SYRINGE | INTRAMUSCULAR | Status: AC

## 2023-08-12 MED ORDER — HYDROCODONE-ACETAMINOPHEN 5-325 MG PO TABS: 1.0000 | ORAL_TABLET | ORAL | 0 refills | Status: DC | PRN

## 2023-08-12 MED ORDER — ACETAMINOPHEN 500 MG PO TABS: 1000.0000 mg | ORAL_TABLET | Freq: Three times a day (TID) | ORAL | Status: AC | PRN

## 2023-08-12 NOTE — Progress Notes (Signed)
 The writer called [employer] to give report. First attempt no one picked up.Will try a second time in a little while.

## 2023-08-12 NOTE — Progress Notes (Signed)
 Physical Therapy Treatment Patient Details Name: Susan Shah MRN: [id number] DOB: [birth year] Today's Date: 08/12/2023   History of Present Illness Pt is a 78 year old female admitted with Nondisplaced fracture of right tibial spine    PMH significant for anxiety, asthma, depression, GERD, hypertension, dyslipidemia and OSA as well as Parkinson's disease    PT Comments  Pt received upright in bed agreeable to PT/OT co treat/eval to progress functional mobility.  Pt reliant on minA for supine to sit EOB mainly needing assist with RLE. Some PRN assist to elevate RLE while seated EOB. X1 STS to crutches and x2 STS using RW. modA +1 needed to stand to crtuches, notable BW through RLE heel with crutches. minA+1 for STS using RW maintaining NWB on RLE and able to complete stand pivot at CGA+1 to recliner. Additional STS performed from recliner using RW and ambulating 2' with chair follow from OT. Unable to hop on LLE, needs to pivot LLE forward in order to progress safely. Light touching of RLE on floor with need for regular VC's for total NWB on RLE. Difficulty noted maintaining NWB likely due to hx of polio on same limb. Discussed likely need to focus on transfers for now and use of power w/c or manual w/c for mobility beyond transfer needs. Also discussed pros and cons of RW vs crutches for falls risk and safety and assistance in maintaining NWB compliance on RLE. Pt understanding. Pt with all needs in reach. D/c recs remain appropriate.    If plan is discharge home, recommend the following: Two people to help with walking and/or transfers;Two people to help with bathing/dressing/bathroom;Help with stairs or ramp for entrance;Assist for transportation   Can travel by private vehicle     No  Equipment Recommendations  None recommended by PT    Recommendations for Other Services       Precautions / Restrictions Precautions Precautions: Fall Recall of Precautions/Restrictions:  Impaired Restrictions Weight Bearing Restrictions Per Provider Order: Yes RLE Weight Bearing Per Provider Order: Non weight bearing     Mobility  Bed Mobility Overal bed mobility: Needs Assistance Bed Mobility: Supine to Sit     Supine to sit: Min assist, HOB elevated, Used rails       Patient Response: Cooperative  Transfers Overall transfer level: Needs assistance Equipment used: Crutches, Rolling walker (2 wheels) Transfers: Sit to/from Stand, Bed to chair/wheelchair/BSC Sit to Stand: Min assist, Mod assist, +2 physical assistance (modA with crutches, minA with RW) Stand pivot transfers: Min assist, +2 safety/equipment, +2 physical assistance (using RW)         General transfer comment: frequent cueing for technique. Difficulty due to polio keeping RLE elevated. Is able to offload enough where RLE is gently tapping floor. Hard to assess if truly WB'ing onto RLE or resting on floor.    Ambulation/Gait Ambulation/Gait assistance: Contact guard assist Gait Distance (Feet): 2 Feet Assistive device: Rolling walker (2 wheels) Gait Pattern/deviations: Step-to pattern       General Gait Details: Has to pivot LLE forward on floor with RW to attempt gait with NWB on RLE   Stairs             Wheelchair Mobility     Tilt Bed Tilt Bed Patient Response: Cooperative  Modified Rankin (Stroke Patients Only)       Balance Overall balance assessment: Needs assistance Sitting-balance support: Feet supported Sitting balance-Leahy Scale: Good     Standing balance support: Bilateral upper extremity supported, During functional  activity, Reliant on assistive device for balance Standing balance-Leahy Scale: Fair Standing balance comment: fair adherence to NWBing status                            Communication Communication Communication: No apparent difficulties  Cognition Arousal: Alert Behavior During Therapy: WFL for tasks assessed/performed   PT  - Cognitive impairments: No apparent impairments                         Following commands: Impaired Following commands impaired: Follows one step commands with increased time    Cueing Cueing Techniques: Verbal cues, Tactile cues  Exercises Other Exercises Other Exercises: discussed pros and cons of RW versus crutches and how it relates to maintaining NWB and falls risk    General Comments        Pertinent Vitals/Pain Pain Assessment Pain Assessment: 0-10 Pain Score: 3  Pain Location: RLE Pain Intervention(s): Monitored during session, Repositioned    Home Living Family/patient expects to be discharged to:: Private residence Living Arrangements: Spouse/significant other Available Help at Discharge: Family                    Prior Function            PT Goals (current goals can now be found in the care plan section) Acute Rehab PT Goals Patient Stated Goal: to go to rehab and get back home following. PT Goal Formulation: With patient Time For Goal Achievement: 08/25/23 Potential to Achieve Goals: Good Progress towards PT goals: Progressing toward goals    Frequency    Min 3X/week      PT Plan      Co-evaluation PT/OT/SLP Co-Evaluation/Treatment: Yes Reason for Co-Treatment: For patient/therapist safety;To address functional/ADL transfers PT goals addressed during session: Mobility/safety with mobility;Proper use of DME OT goals addressed during session: ADL's and self-care      AM-PAC PT 6 Clicks Mobility   Outcome Measure  Help needed turning from your back to your side while in a flat bed without using bedrails?: A Lot Help needed moving from lying on your back to sitting on the side of a flat bed without using bedrails?: A Lot Help needed moving to and from a bed to a chair (including a wheelchair)?: A Lot Help needed standing up from a chair using your arms (e.g., wheelchair or bedside chair)?: A Lot Help needed to walk in  hospital room?: A Lot Help needed climbing 3-5 steps with a railing? : Total 6 Click Score: 11    End of Session Equipment Utilized During Treatment: Gait belt Activity Tolerance: Patient tolerated treatment well Patient left: in chair;with call bell/phone within reach;with chair alarm set;with family/visitor present Nurse Communication: Mobility status PT Visit Diagnosis: Unsteadiness on feet (R26.81);Other abnormalities of gait and mobility (R26.89);Repeated falls (R29.6);Muscle weakness (generalized) (M62.81);History of falling (Z91.81);Difficulty in walking, not elsewhere classified (R26.2);Pain Pain - Right/Left: Right Pain - part of body: Leg     Time: 1101-1118 PT Time Calculation (min) (ACUTE ONLY): 17 min  Charges:    $Therapeutic Activity: 8-22 mins PT General Charges $$ ACUTE PT VISIT: 1 Visit                     Dorina HERO. Fairly IV, PT, DPT Physical Therapist- [facility] [place]  [facility] 08/12/2023, 12:41 PM

## 2023-08-12 NOTE — Plan of Care (Signed)
   Problem: Education: Goal: Knowledge of General Education information will improve Description Including pain rating scale, medication(s)/side effects and non-pharmacologic comfort measures Outcome: Progressing   Problem: Health Behavior/Discharge Planning: Goal: Ability to manage health-related needs will improve Outcome: Progressing

## 2023-08-12 NOTE — TOC Transition Note (Signed)
 Transition of Care [facility]) - Discharge Note   Patient Details  Name: Susan Shah MRN: [id number] Date of Birth: 1945/09/01  Transition of Care [facility]) CM/SW Contact:  Marinda Cooks, RN Phone Number: 08/12/2023, 2:24 PM   Clinical Narrative:    This CM updated by covering MD pt medically cleared to dc today and has active DC order . This CM spoke with Admission liaison [place] @ [facility]. DC transportation confirmed for pt with [facility] team updated . Pt's son called and updated on dc plan.  No additional DC needs requested by medical team or identified by CM at this time.      Barriers to Discharge: No Barriers Identified   Patient Goals and CMS Choice Patient states their goals for this hospitalization and ongoing recovery are:: To recieve Rehab and dc home CMS Medicare.gov Compare Post Acute Care list provided to:: Patient Choice offered to / list presented to : Patient      Discharge Placement PASRR number recieved: 08/10/23            Patient chooses bed at: [facility] Patient to be transferred to facility by: [facility] Name of family member notified: Husband Patient and family notified of of transfer: 08/12/23  Discharge Plan and Services Additional resources added to the After Visit Summary for                                       Social Drivers of Health (SDOH) Interventions SDOH Screenings   Food Insecurity: No Food Insecurity (08/10/2023)  Housing: Low Risk  (08/10/2023)  Transportation Needs: No Transportation Needs (08/10/2023)  Utilities: Not At Risk (08/10/2023)  Alcohol  Screen: Low Risk  (09/25/2021)  Depression (PHQ2-9): High Risk (02/21/2022)  Financial Resource Strain: Low Risk  (08/07/2023)   Received from [facility]  Social Connections: Patient Unable To Answer (08/10/2023)  Tobacco Use: Medium Risk (08/09/2023)     Readmission Risk Interventions     No data to display

## 2023-08-12 NOTE — Evaluation (Signed)
 Occupational Therapy Evaluation Patient Details Name: Susan Shah MRN: [id number] DOB: [birth year] Today's Date: 08/12/2023   History of Present Illness   Pt is a 78 year old female admitted with Nondisplaced fracture of right tibial spine    PMH significant for anxiety, asthma, depression, GERD, hypertension, dyslipidemia and OSA as well as Parkinson's disease     Clinical Impressions Chart reviewed to date, pt greeted in bed, agreeable to OT evaluation. PTA pt is generally MOD I with ADL (has supervision for bathing) and has assist for IADL from husband but she reports she does drive. Pt amb household distances with her personal crutches and community distances with pwc (per what husband described sounds like a  group 2 pwc and does not have ELR). Pt presents with deficits in endurance, activity tolerance, balance, affecting safe and optimal ADL completion. STS completed with MIN-MOD A +2 with crutches, MIN A +2 with RW, SPT to bedside chair with MIN A +2 with RW. MAX A required for LB dressing. Pt is performing ADL below PLOF, will benefit from acute OT to address deficits to facilitate optimal ADL performance. Pt is left in chair, all needs met. OT will follow acutely.      If plan is discharge home, recommend the following:   A lot of help with walking and/or transfers;A lot of help with bathing/dressing/bathroom;Help with stairs or ramp for entrance;Assist for transportation;Assistance with cooking/housework     Functional Status Assessment   Patient has had a recent decline in their functional status and demonstrates the ability to make significant improvements in function in a reasonable and predictable amount of time.     Equipment Recommendations   Other (comment) (defer to next venue of care)     Recommendations for Other Services         Precautions/Restrictions   Precautions Precautions: Fall Recall of Precautions/Restrictions: Impaired Restrictions Weight  Bearing Restrictions Per Provider Order: Yes RLE Weight Bearing Per Provider Order: Non weight bearing     Mobility Bed Mobility Overal bed mobility: Needs Assistance Bed Mobility: Supine to Sit     Supine to sit: Min assist, HOB elevated, Used rails          Transfers Overall transfer level: Needs assistance Equipment used: Crutches, Rolling walker (2 wheels) Transfers: Sit to/from Stand, Bed to chair/wheelchair/BSC Sit to Stand: Min assist, Mod assist, +2 physical assistance (with crutches, MIN A +2 with RW mulitple attempts) Stand pivot transfers: Min assist, +2 safety/equipment, +2 physical assistance (with RW)         General transfer comment: frequent cueing for technique      Balance Overall balance assessment: Needs assistance Sitting-balance support: Feet supported Sitting balance-Leahy Scale: Good     Standing balance support: Bilateral upper extremity supported, During functional activity, Reliant on assistive device for balance Standing balance-Leahy Scale: Fair Standing balance comment: fair adherence to NWBing status                           ADL either performed or assessed with clinical judgement   ADL Overall ADL's : Needs assistance/impaired Eating/Feeding: Set up;Sitting   Grooming: Minimal assistance Grooming Details (indicate cue type and reason): anticipate             Lower Body Dressing: Maximal assistance   Toilet Transfer: Minimal assistance;+2 for safety/equipment;Rolling walker (2 wheels);Stand-pivot Statistician Details (indicate cue type and reason): frequent cueing for technique  Vision Patient Visual Report: No change from baseline       Perception         Praxis         Pertinent Vitals/Pain Pain Assessment Pain Assessment: No/denies pain     Extremity/Trunk Assessment Upper Extremity Assessment Upper Extremity Assessment: Overall WFL for tasks assessed   Lower Extremity  Assessment Lower Extremity Assessment: RLE deficits/detail RLE Deficits / Details: in splint       Communication Communication Communication: No apparent difficulties   Cognition Arousal: Alert Behavior During Therapy: WFL for tasks assessed/performed Cognition: Cognition impaired           Executive functioning impairment (select all impairments): Problem solving                   Following commands: Impaired Following commands impaired: Follows one step commands with increased time     Cueing  General Comments   Cueing Techniques: Verbal cues;Tactile cues      Exercises Other Exercises Other Exercises: edu re: role of OT, role of rehab, discharge recommendations   Shoulder Instructions      Home Living Family/patient expects to be discharged to:: Private residence Living Arrangements: Spouse/significant other Available Help at Discharge: Family                                    Prior Functioning/Environment Prior Level of Function : Independent/Modified Independent             Mobility Comments: amb with crutches in the house, pwc community distances (what husband described sounds like a group 2 pwc) ADLs Comments: MOD I with dressing/feeding/grooming/toileting; supervision for showering    OT Problem List: Decreased activity tolerance;Decreased knowledge of use of DME or AE;Decreased knowledge of precautions;Impaired balance (sitting and/or standing)   OT Treatment/Interventions: Self-care/ADL training;Therapeutic exercise;Patient/family education;Balance training;Energy conservation;Therapeutic activities;DME and/or AE instruction      OT Goals(Current goals can be found in the care plan section)   Acute Rehab OT Goals Patient Stated Goal: improve function OT Goal Formulation: With patient/family Time For Goal Achievement: 08/26/23 Potential to Achieve Goals: Good ADL Goals Pt Will Perform Grooming: with modified  independence;sitting Pt Will Perform Lower Body Dressing: with supervision;sitting/lateral leans Pt Will Transfer to Toilet: with modified independence;stand pivot transfer Pt Will Perform Toileting - Clothing Manipulation and hygiene: with modified independence;sitting/lateral leans   OT Frequency:  Min 2X/week    Co-evaluation PT/OT/SLP Co-Evaluation/Treatment: Yes Reason for Co-Treatment: For patient/therapist safety;To address functional/ADL transfers   OT goals addressed during session: ADL's and self-care      AM-PAC OT 6 Clicks Daily Activity     Outcome Measure Help from another person eating meals?: None Help from another person taking care of personal grooming?: None Help from another person toileting, which includes using toliet, bedpan, or urinal?: A Lot Help from another person bathing (including washing, rinsing, drying)?: A Lot Help from another person to put on and taking off regular upper body clothing?: A Little Help from another person to put on and taking off regular lower body clothing?: A Lot 6 Click Score: 17   End of Session Equipment Utilized During Treatment: Gait belt;Rolling walker (2 wheels);Other (comment) (personal crutches) Nurse Communication: Mobility status  Activity Tolerance: Patient tolerated treatment well Patient left: in chair;with call bell/phone within reach;with chair alarm set  OT Visit Diagnosis: Other abnormalities of gait and mobility (R26.89)  Time: 1100-1119 OT Time Calculation (min): 19 min Charges:  OT General Charges $OT Visit: 1 Visit OT Evaluation $OT Eval Moderate Complexity: 1 Mod  Therisa Sheffield, OTD OTR/L  08/12/23, 11:38 AM

## 2023-08-12 NOTE — Discharge Summary (Signed)
 Physician Discharge Summary   Susan Shah  female DOB: 10-Aug-1945  FMW:[id number]  PCP: Liana Fish, NP  Admit date: 08/09/2023 Discharge date: 08/12/2023  Admitted From: home Disposition:  SNF rehab CODE STATUS: Full code   Hospital Course:  For full details, please see H&P, progress notes, consult notes and ancillary notes.  Briefly,  Susan Shah is a 78 y.o. Caucasian female with medical history significant for anxiety, asthma, hypertension, OSA as well as Parkinson's disease, who presented to the emergency room with a fall when she was getting from bathtub to her bed and slid from the fall on her right leg.     * Nondisplaced fracture of right tibial spine, initial encounter for closed fracture -ortho consulted with Dr. Cleotilde, non-surgical management. --cont long-leg splint --nonweightbearing to RLE. --f/u with pt's outpatient ortho Dr. Kathlynn 1 week after discharge.   Hypokalemia --monitored and supplemented PRN   Dyslipidemia - continue statin therapy.   Anxiety and depression - continue Wellbutrin  XL and and Lexapro .   Parkinson's disease (HCC) --cont Sinemet    Essential hypertension --cont amlodipine , losartan , lopressor    Discharge Diagnoses:  Principal Problem:   Nondisplaced fracture of right tibial spine, initial encounter for closed fracture Active Problems:   Hypokalemia   Essential hypertension   Parkinson's disease (HCC)   Anxiety and depression   Dyslipidemia   Right tibial fracture   30 Day Unplanned Readmission Risk Score    Flowsheet Row ED to Hosp-Admission (Current) from 08/09/2023 in [facility] (1A)  30 Day Unplanned Readmission Risk Score (%) 18.21 Filed at 08/10/2023 1200    This score is the patient's risk of an unplanned readmission within 30 days of being discharged (0 -100%). The score is based on dignosis, age, lab data, medications, orders, and past utilization.   Low:  0-14.9    Medium: 15-21.9   High: 22-29.9   Extreme: 30 and above         Discharge Instructions:  Allergies as of 08/12/2023       Reactions   Soma [carisoprodol] Hives   Iodinated Contrast Media Rash   Iodine Rash   Macrobid  [nitrofurantoin ] Nausea And Vomiting        Medication List     STOP taking these medications    potassium chloride  SA 20 MEQ tablet Commonly known as: KLOR-CON  M   predniSONE  10 MG tablet Commonly known as: DELTASONE        TAKE these medications    acetaminophen  500 MG tablet Commonly known as: TYLENOL  Take 2 tablets (1,000 mg total) by mouth every 8 (eight) hours as needed for headache or mild pain (pain score 1-3). What changed:  when to take this reasons to take this   amLODipine  2.5 MG tablet Commonly known as: NORVASC  TAKE 1 TABLET BY MOUTH EVERY DAY- STOP LISINOPRIL    buPROPion  150 MG 24 hr tablet Commonly known as: WELLBUTRIN  XL Take 1 tablet (150 mg total) by mouth daily.   Carbidopa -Levodopa  ER 25-100 MG tablet controlled release Commonly known as: SINEMET  CR Take 1 tablet by mouth 3 (three) times daily.   cholecalciferol  25 MCG (1000 UNIT) tablet Commonly known as: VITAMIN D3 Take 1,000 Units by mouth daily.   cyanocobalamin  1000 MCG tablet Commonly known as: VITAMIN B12 Take 1,000 mcg by mouth daily.   enoxaparin  40 MG/0.4ML injection Commonly known as: LOVENOX  Inject 0.4 mLs (40 mg total) into the skin daily for 14 days. Start taking on: August 13, 2023  escitalopram  20 MG tablet Commonly known as: LEXAPRO  Take 1 tablet (20 mg total) by mouth daily.   famotidine  40 MG tablet Commonly known as: PEPCID  TAKE 1 TABLET(40 MG) BY MOUTH TWICE DAILY What changed: See the new instructions.   fluticasone  50 MCG/ACT nasal spray Commonly known as: FLONASE  Place 1 spray into both nostrils at bedtime as needed for allergies or rhinitis.   HYDROcodone -acetaminophen  5-325 MG tablet Commonly known as: NORCO/VICODIN Take 1-2  tablets by mouth every 4 (four) hours as needed for moderate pain (pain score 4-6) or severe pain (pain score 7-10). What changed:  how much to take when to take this reasons to take this Another medication with the same name was removed. Continue taking this medication, and follow the directions you see here.   loratadine  10 MG tablet Commonly known as: QC Loratadine  Allergy Relief Take 1 tablet (10 mg total) by mouth daily.   losartan  100 MG tablet Commonly known as: COZAAR  Take 1 tablet (100 mg total) by mouth daily.   LUBRICATING EYE DROPS OP Place 1 drop into both eyes daily as needed (dry eyes).   metoprolol  tartrate 25 MG tablet Commonly known as: LOPRESSOR  TAKE 1 TABLET (25 MG TOTAL) BY MOUTH TWICE A DAY WITH MEALS   neomycin -polymyxin b -dexamethasone  3.5-10000-0.1 Susp Commonly known as: MAXITROL  Place 1 drop into both eyes every 6 (six) hours.   ondansetron  4 MG disintegrating tablet Commonly known as: ZOFRAN -ODT Take 1 tablet (4 mg total) by mouth every 8 (eight) hours as needed for nausea or vomiting.   oxybutynin  10 MG 24 hr tablet Commonly known as: DITROPAN -XL Take 2 tablets (20 mg total) by mouth daily.   pantoprazole  40 MG tablet Commonly known as: PROTONIX  Take 1 tablet (40 mg total) by mouth 2 (two) times daily.   rOPINIRole  0.25 MG tablet Commonly known as: REQUIP  Take by mouth.   simvastatin  20 MG tablet Commonly known as: ZOCOR  TAKE 1 TABLET BY MOUTH DAILY AT 6 PM.   tiZANidine  4 MG tablet Commonly known as: ZANAFLEX  Take 4 mg by mouth 3 (three) times daily as needed.         Follow-up Information     Kathlynn Sharper, MD Follow up in 1 week(s).   Specialty: Orthopedic Surgery Why: For re-evaluation Please make an appointment for the patient with Dr. Kathlynn 1 week following discharge Contact information: [address] [facility] [place] [address] [phone]         Liana Fish, NP Follow up.    Specialty: Nurse Practitioner Contact information: [address] [place] [address] [phone]                 Allergies  Allergen Reactions   Soma [Carisoprodol] Hives   Iodinated Contrast Media Rash   Iodine Rash   Macrobid  [Nitrofurantoin ] Nausea And Vomiting     The results of significant diagnostics from this hospitalization (including imaging, microbiology, ancillary and laboratory) are listed below for reference.   Consultations:   Procedures/Studies: DG Foot Complete Right Result Date: 08/10/2023 CLINICAL DATA:  Status post fall. EXAM: RIGHT FOOT COMPLETE - 3+ VIEW COMPARISON:  None Available. FINDINGS: There is no evidence of an acute fracture or dislocation. A chronic fracture deformity is seen involving the proximal aspect of the first right metatarsal. Diffuse osteopenic changes are seen. Degenerative changes are noted, most prominently within the mid right foot. There is mild dorsal soft tissue swelling throughout the right foot. IMPRESSION: 1. Chronic fracture deformity  of the proximal aspect of the first right metatarsal. 2. Dorsal soft tissue swelling without evidence of an acute osseous abnormality. Electronically Signed   By: Suzen Dials M.D.   On: 08/10/2023 01:11   DG Knee Complete 4 Views Right Result Date: 08/10/2023 EXAM: 4 VIEW(S) XRAY OF THE RIGHT KNEE 08/10/2023 12:29:28 AM COMPARISON: None available. CLINICAL HISTORY: Ground-level fall with pain in her right hip, right lower femur and knee, and right lower leg and ankle. PER ER NOTE; Pt arrives AC[facility] from home for a fall with C/O pain in both legs (8/10). No LOC, didn't hit head, not on blood thinners. hx of parkinson. ; ; 164/138; 86 bpm; 96 o2; 98.1; 100 CBG; BEST IMAGES OBTAINED DUE TO PATIENTS RIGHT LEG PARALYZED AT BASELINE PER PATIENT AND FAMILY FINDINGS: BONES AND JOINTS: Nondisplaced oblique proximal tibial shaft fracture. No joint dislocation. No significant joint effusion. No  significant degenerative changes. SOFT TISSUES: The soft tissues are unremarkable. IMPRESSION: 1. Nondisplaced oblique proximal tibial shaft fracture. Electronically signed by: Pinkie Pebbles MD 08/10/2023 12:36 AM EDT RP Workstation: [id number]   DG Ankle Complete Right Result Date: 08/10/2023 EXAM: 3 or more VIEW(S) XRAY OF THE RIGHT ANKLE 08/10/2023 12:29:28 AM CLINICAL HISTORY: Ground-level fall with pain in her right hip, right lower femur and knee, and right lower leg and ankle. PER ER NOTE; Pt arrives ACMS from home for a fall with C/O pain in both legs (8/10). No LOC, didn't hit head, not on blood thinners. hx of parkinson. 164/138; 86 bpm; 96 o2; 98.1; 100 CBG; BEST IMAGES OBTAINED DUE TO PATIENTS RIGHT LEG PARALYZED AT BASELINE PER PATIENT AND FAMILY. COMPARISON: None available. FINDINGS: BONES AND JOINTS: No acute fracture. No focal osseous lesion. No joint dislocation. SOFT TISSUES: The soft tissues are unremarkable. IMPRESSION: 1. No acute osseous abnormality. Electronically signed by: Pinkie Pebbles MD 08/10/2023 12:35 AM EDT RP Workstation: [id number]   DG Hip Unilat W or Wo Pelvis 2-3 Views Right Result Date: 08/10/2023 EXAM: 3 VIEW(S) XRAY OF THE RIGHT HIP 08/10/2023 12:29:28 AM COMPARISON: None available. CLINICAL HISTORY: Ground-level fall with pain in her right hip, right lower femur and knee, and right lower leg and ankle. PER ER NOTE; Pt arrives [facility]MS from home for a fall with C/O pain in both legs (8/10). No LOC, didn't hit head, not on blood thinners. hx of parkinson. ; ; 164/138; 86 bpm; 96 o2; 98.1; 100 CBG; BEST IMAGES OBTAINED DUE TO PATIENTS RIGHT LEG PARALYZED AT BASELINE PER PATIENT AND FAMILY FINDINGS: BONES AND JOINTS: No acute fracture. Shallow right acetabulum, chronic. Internal rotation of the right hip. SOFT TISSUES: The soft tissues are unremarkable. IMPRESSION: 1. No acute fracture or dislocation. 2. Shallow right acetabulum, chronic. Electronically signed by: Pinkie Pebbles MD 08/10/2023 12:35 AM EDT RP Workstation: [id number]      Labs: BNP (last 3 results) No results for input(s): BNP in the last 8760 hours. Basic Metabolic Panel: Recent Labs  Lab 08/10/23 0155 08/10/23 0534 08/11/23 0421  NA 138 138 142  K 3.3* 2.7* 4.8  CL 104 102 108  CO2 25 25 24   GLUCOSE 111* 104* 96  BUN 25* 27* 22  CREATININE 0.82 0.75 0.73  CALCIUM 9.3 9.5 8.9  MG  --  1.9 2.0   Liver Function Tests: No results for input(s): AST, ALT, ALKPHOS, BILITOT, PROT, ALBUMIN in the last 168 hours. No results for input(s): LIPASE, AMYLASE in the last 168 hours. No results for input(s): AMMONIA in the last 168  hours. CBC: Recent Labs  Lab 08/10/23 0155 08/10/23 0534 08/11/23 0421  WBC 11.8* 12.7* 8.3  NEUTROABS 9.4*  --   --   HGB 12.0 10.5* 10.5*  HCT 37.6 33.0* 34.6*  MCV 88.3 87.3 91.5  PLT 313 256 251   Cardiac Enzymes: No results for input(s): CKTOTAL, CKMB, CKMBINDEX, TROPONINI in the last 168 hours. BNP: Invalid input(s): POCBNP CBG: No results for input(s): GLUCAP in the last 168 hours. D-Dimer No results for input(s): DDIMER in the last 72 hours. Hgb A1c No results for input(s): HGBA1C in the last 72 hours. Lipid Profile No results for input(s): CHOL, HDL, LDLCALC, TRIG, CHOLHDL, LDLDIRECT in the last 72 hours. Thyroid  function studies No results for input(s): TSH, T4TOTAL, T3FREE, THYROIDAB in the last 72 hours.  Invalid input(s): FREET3 Anemia work up No results for input(s): VITAMINB12, FOLATE, FERRITIN, TIBC, IRON, RETICCTPCT in the last 72 hours. Urinalysis    Component Value Date/Time   BILIRUBINUR Negative 04/10/2023 0915   PROTEINUR (L) 04/10/2023 0915     Comment:     Trace   UROBILINOGEN 0.2 04/10/2023 0915   NITRITE negative 04/10/2023 0915   LEUKOCYTESUR Trace (A) 04/10/2023 0915   Sepsis Labs Recent Labs  Lab 08/10/23 0155 08/10/23 0534  08/11/23 0421  WBC 11.8* 12.7* 8.3   Microbiology No results found for this or any previous visit (from the past 240 hours).   Total time spend on discharging this patient, including the last patient exam, discussing the hospital stay, instructions for ongoing care as it relates to all pertinent caregivers, as well as preparing the medical discharge records, prescriptions, and/or referrals as applicable, is 25 minutes.    Ellouise Haber, MD  [facility] 08/12/2023, 2:23 PM

## 2023-08-12 NOTE — Progress Notes (Signed)
 Called [facility] and gave report to [place], LPN. Pt denies any pain or discomfort at this time. Will continue to monitor until transport arrives.

## 2023-08-15 DIAGNOSIS — S82111A Displaced fracture of right tibial spine, initial encounter for closed fracture: Secondary | ICD-10-CM | POA: Diagnosis not present

## 2023-08-15 DIAGNOSIS — F3340 Major depressive disorder, recurrent, in remission, unspecified: Secondary | ICD-10-CM | POA: Diagnosis not present

## 2023-08-15 DIAGNOSIS — F411 Generalized anxiety disorder: Secondary | ICD-10-CM | POA: Diagnosis not present

## 2023-08-15 DIAGNOSIS — E876 Hypokalemia: Secondary | ICD-10-CM | POA: Diagnosis not present

## 2023-08-18 DIAGNOSIS — I10 Essential (primary) hypertension: Secondary | ICD-10-CM | POA: Diagnosis not present

## 2023-08-18 DIAGNOSIS — S82111A Displaced fracture of right tibial spine, initial encounter for closed fracture: Secondary | ICD-10-CM | POA: Diagnosis not present

## 2023-08-18 DIAGNOSIS — F419 Anxiety disorder, unspecified: Secondary | ICD-10-CM | POA: Diagnosis not present

## 2023-08-18 DIAGNOSIS — G20A1 Parkinson's disease without dyskinesia, without mention of fluctuations: Secondary | ICD-10-CM | POA: Diagnosis not present

## 2023-08-19 ENCOUNTER — Other Ambulatory Visit: Payer: Self-pay | Admitting: Orthopedic Surgery

## 2023-08-19 DIAGNOSIS — M4807 Spinal stenosis, lumbosacral region: Secondary | ICD-10-CM

## 2023-08-19 DIAGNOSIS — G4733 Obstructive sleep apnea (adult) (pediatric): Secondary | ICD-10-CM | POA: Diagnosis not present

## 2023-08-19 DIAGNOSIS — S82111D Displaced fracture of right tibial spine, subsequent encounter for closed fracture with routine healing: Secondary | ICD-10-CM | POA: Diagnosis not present

## 2023-08-19 DIAGNOSIS — J45909 Unspecified asthma, uncomplicated: Secondary | ICD-10-CM | POA: Diagnosis not present

## 2023-08-19 DIAGNOSIS — G20A1 Parkinson's disease without dyskinesia, without mention of fluctuations: Secondary | ICD-10-CM | POA: Diagnosis not present

## 2023-08-20 DIAGNOSIS — Z9181 History of falling: Secondary | ICD-10-CM | POA: Diagnosis not present

## 2023-08-20 DIAGNOSIS — F411 Generalized anxiety disorder: Secondary | ICD-10-CM | POA: Diagnosis not present

## 2023-08-20 DIAGNOSIS — S82111D Displaced fracture of right tibial spine, subsequent encounter for closed fracture with routine healing: Secondary | ICD-10-CM | POA: Diagnosis not present

## 2023-08-20 DIAGNOSIS — E876 Hypokalemia: Secondary | ICD-10-CM | POA: Diagnosis not present

## 2023-08-21 ENCOUNTER — Encounter

## 2023-08-22 DIAGNOSIS — Z9181 History of falling: Secondary | ICD-10-CM | POA: Diagnosis not present

## 2023-08-22 DIAGNOSIS — S82111D Displaced fracture of right tibial spine, subsequent encounter for closed fracture with routine healing: Secondary | ICD-10-CM | POA: Diagnosis not present

## 2023-08-22 DIAGNOSIS — F411 Generalized anxiety disorder: Secondary | ICD-10-CM | POA: Diagnosis not present

## 2023-08-22 DIAGNOSIS — G20A1 Parkinson's disease without dyskinesia, without mention of fluctuations: Secondary | ICD-10-CM | POA: Diagnosis not present

## 2023-08-25 DIAGNOSIS — F411 Generalized anxiety disorder: Secondary | ICD-10-CM | POA: Diagnosis not present

## 2023-08-25 DIAGNOSIS — G20A1 Parkinson's disease without dyskinesia, without mention of fluctuations: Secondary | ICD-10-CM | POA: Diagnosis not present

## 2023-08-25 DIAGNOSIS — Z9181 History of falling: Secondary | ICD-10-CM | POA: Diagnosis not present

## 2023-08-25 DIAGNOSIS — S82111D Displaced fracture of right tibial spine, subsequent encounter for closed fracture with routine healing: Secondary | ICD-10-CM | POA: Diagnosis not present

## 2023-08-27 DIAGNOSIS — G20A1 Parkinson's disease without dyskinesia, without mention of fluctuations: Secondary | ICD-10-CM | POA: Diagnosis not present

## 2023-08-27 DIAGNOSIS — S82111D Displaced fracture of right tibial spine, subsequent encounter for closed fracture with routine healing: Secondary | ICD-10-CM | POA: Diagnosis not present

## 2023-08-27 DIAGNOSIS — F411 Generalized anxiety disorder: Secondary | ICD-10-CM | POA: Diagnosis not present

## 2023-08-27 DIAGNOSIS — Z9181 History of falling: Secondary | ICD-10-CM | POA: Diagnosis not present

## 2023-08-27 DIAGNOSIS — S82101A Unspecified fracture of upper end of right tibia, initial encounter for closed fracture: Secondary | ICD-10-CM | POA: Diagnosis not present

## 2023-09-01 DIAGNOSIS — Z9181 History of falling: Secondary | ICD-10-CM | POA: Diagnosis not present

## 2023-09-01 DIAGNOSIS — F411 Generalized anxiety disorder: Secondary | ICD-10-CM | POA: Diagnosis not present

## 2023-09-01 DIAGNOSIS — G20A1 Parkinson's disease without dyskinesia, without mention of fluctuations: Secondary | ICD-10-CM | POA: Diagnosis not present

## 2023-09-01 DIAGNOSIS — S82111D Displaced fracture of right tibial spine, subsequent encounter for closed fracture with routine healing: Secondary | ICD-10-CM | POA: Diagnosis not present

## 2023-09-05 DIAGNOSIS — S82111D Displaced fracture of right tibial spine, subsequent encounter for closed fracture with routine healing: Secondary | ICD-10-CM | POA: Diagnosis not present

## 2023-09-05 DIAGNOSIS — F411 Generalized anxiety disorder: Secondary | ICD-10-CM | POA: Diagnosis not present

## 2023-09-05 DIAGNOSIS — Z9181 History of falling: Secondary | ICD-10-CM | POA: Diagnosis not present

## 2023-09-05 DIAGNOSIS — G20A1 Parkinson's disease without dyskinesia, without mention of fluctuations: Secondary | ICD-10-CM | POA: Diagnosis not present

## 2023-09-06 DIAGNOSIS — H9193 Unspecified hearing loss, bilateral: Secondary | ICD-10-CM | POA: Diagnosis not present

## 2023-09-06 DIAGNOSIS — E785 Hyperlipidemia, unspecified: Secondary | ICD-10-CM | POA: Diagnosis not present

## 2023-09-06 DIAGNOSIS — Z87891 Personal history of nicotine dependence: Secondary | ICD-10-CM | POA: Diagnosis not present

## 2023-09-06 DIAGNOSIS — G20A1 Parkinson's disease without dyskinesia, without mention of fluctuations: Secondary | ICD-10-CM | POA: Diagnosis not present

## 2023-09-06 DIAGNOSIS — Z9841 Cataract extraction status, right eye: Secondary | ICD-10-CM | POA: Diagnosis not present

## 2023-09-06 DIAGNOSIS — Z9842 Cataract extraction status, left eye: Secondary | ICD-10-CM | POA: Diagnosis not present

## 2023-09-06 DIAGNOSIS — H547 Unspecified visual loss: Secondary | ICD-10-CM | POA: Diagnosis not present

## 2023-09-06 DIAGNOSIS — Z9049 Acquired absence of other specified parts of digestive tract: Secondary | ICD-10-CM | POA: Diagnosis not present

## 2023-09-06 DIAGNOSIS — R32 Unspecified urinary incontinence: Secondary | ICD-10-CM | POA: Diagnosis not present

## 2023-09-06 DIAGNOSIS — S82111D Displaced fracture of right tibial spine, subsequent encounter for closed fracture with routine healing: Secondary | ICD-10-CM | POA: Diagnosis not present

## 2023-09-06 DIAGNOSIS — Z9181 History of falling: Secondary | ICD-10-CM | POA: Diagnosis not present

## 2023-09-06 DIAGNOSIS — F329 Major depressive disorder, single episode, unspecified: Secondary | ICD-10-CM | POA: Diagnosis not present

## 2023-09-06 DIAGNOSIS — Z981 Arthrodesis status: Secondary | ICD-10-CM | POA: Diagnosis not present

## 2023-09-06 DIAGNOSIS — I10 Essential (primary) hypertension: Secondary | ICD-10-CM | POA: Diagnosis not present

## 2023-09-06 DIAGNOSIS — W19XXXD Unspecified fall, subsequent encounter: Secondary | ICD-10-CM | POA: Diagnosis not present

## 2023-09-06 DIAGNOSIS — F419 Anxiety disorder, unspecified: Secondary | ICD-10-CM | POA: Diagnosis not present

## 2023-09-06 DIAGNOSIS — M1990 Unspecified osteoarthritis, unspecified site: Secondary | ICD-10-CM | POA: Diagnosis not present

## 2023-09-06 DIAGNOSIS — K5900 Constipation, unspecified: Secondary | ICD-10-CM | POA: Diagnosis not present

## 2023-09-06 DIAGNOSIS — G4733 Obstructive sleep apnea (adult) (pediatric): Secondary | ICD-10-CM | POA: Diagnosis not present

## 2023-09-06 DIAGNOSIS — Z90710 Acquired absence of both cervix and uterus: Secondary | ICD-10-CM | POA: Diagnosis not present

## 2023-09-06 DIAGNOSIS — R911 Solitary pulmonary nodule: Secondary | ICD-10-CM | POA: Diagnosis not present

## 2023-09-06 DIAGNOSIS — J45909 Unspecified asthma, uncomplicated: Secondary | ICD-10-CM | POA: Diagnosis not present

## 2023-09-06 DIAGNOSIS — K219 Gastro-esophageal reflux disease without esophagitis: Secondary | ICD-10-CM | POA: Diagnosis not present

## 2023-09-08 ENCOUNTER — Telehealth: Payer: Self-pay

## 2023-09-08 DIAGNOSIS — H9193 Unspecified hearing loss, bilateral: Secondary | ICD-10-CM | POA: Diagnosis not present

## 2023-09-08 DIAGNOSIS — Z9181 History of falling: Secondary | ICD-10-CM | POA: Diagnosis not present

## 2023-09-08 DIAGNOSIS — Z981 Arthrodesis status: Secondary | ICD-10-CM | POA: Diagnosis not present

## 2023-09-08 DIAGNOSIS — W19XXXD Unspecified fall, subsequent encounter: Secondary | ICD-10-CM | POA: Diagnosis not present

## 2023-09-08 DIAGNOSIS — R911 Solitary pulmonary nodule: Secondary | ICD-10-CM | POA: Diagnosis not present

## 2023-09-08 DIAGNOSIS — G20A1 Parkinson's disease without dyskinesia, without mention of fluctuations: Secondary | ICD-10-CM | POA: Diagnosis not present

## 2023-09-08 DIAGNOSIS — I10 Essential (primary) hypertension: Secondary | ICD-10-CM | POA: Diagnosis not present

## 2023-09-08 DIAGNOSIS — Z9841 Cataract extraction status, right eye: Secondary | ICD-10-CM | POA: Diagnosis not present

## 2023-09-08 DIAGNOSIS — Z87891 Personal history of nicotine dependence: Secondary | ICD-10-CM | POA: Diagnosis not present

## 2023-09-08 DIAGNOSIS — J45909 Unspecified asthma, uncomplicated: Secondary | ICD-10-CM | POA: Diagnosis not present

## 2023-09-08 DIAGNOSIS — F329 Major depressive disorder, single episode, unspecified: Secondary | ICD-10-CM | POA: Diagnosis not present

## 2023-09-08 DIAGNOSIS — G4733 Obstructive sleep apnea (adult) (pediatric): Secondary | ICD-10-CM | POA: Diagnosis not present

## 2023-09-08 DIAGNOSIS — M1990 Unspecified osteoarthritis, unspecified site: Secondary | ICD-10-CM | POA: Diagnosis not present

## 2023-09-08 DIAGNOSIS — K219 Gastro-esophageal reflux disease without esophagitis: Secondary | ICD-10-CM | POA: Diagnosis not present

## 2023-09-08 DIAGNOSIS — Z9842 Cataract extraction status, left eye: Secondary | ICD-10-CM | POA: Diagnosis not present

## 2023-09-08 DIAGNOSIS — F419 Anxiety disorder, unspecified: Secondary | ICD-10-CM | POA: Diagnosis not present

## 2023-09-08 DIAGNOSIS — H547 Unspecified visual loss: Secondary | ICD-10-CM | POA: Diagnosis not present

## 2023-09-08 DIAGNOSIS — Z90710 Acquired absence of both cervix and uterus: Secondary | ICD-10-CM | POA: Diagnosis not present

## 2023-09-08 DIAGNOSIS — S82111D Displaced fracture of right tibial spine, subsequent encounter for closed fracture with routine healing: Secondary | ICD-10-CM | POA: Diagnosis not present

## 2023-09-08 DIAGNOSIS — Z9049 Acquired absence of other specified parts of digestive tract: Secondary | ICD-10-CM | POA: Diagnosis not present

## 2023-09-08 DIAGNOSIS — E785 Hyperlipidemia, unspecified: Secondary | ICD-10-CM | POA: Diagnosis not present

## 2023-09-08 DIAGNOSIS — R32 Unspecified urinary incontinence: Secondary | ICD-10-CM | POA: Diagnosis not present

## 2023-09-08 DIAGNOSIS — K5900 Constipation, unspecified: Secondary | ICD-10-CM | POA: Diagnosis not present

## 2023-09-08 NOTE — Telephone Encounter (Signed)
 Gave verbal okay to home health nurse from [facility] for OT for 1x a week for 3 weeks, skip one week, then again 1x for 3 weeks

## 2023-09-09 DIAGNOSIS — F419 Anxiety disorder, unspecified: Secondary | ICD-10-CM | POA: Diagnosis not present

## 2023-09-09 DIAGNOSIS — R911 Solitary pulmonary nodule: Secondary | ICD-10-CM | POA: Diagnosis not present

## 2023-09-09 DIAGNOSIS — Z87891 Personal history of nicotine dependence: Secondary | ICD-10-CM | POA: Diagnosis not present

## 2023-09-09 DIAGNOSIS — F329 Major depressive disorder, single episode, unspecified: Secondary | ICD-10-CM | POA: Diagnosis not present

## 2023-09-09 DIAGNOSIS — R32 Unspecified urinary incontinence: Secondary | ICD-10-CM | POA: Diagnosis not present

## 2023-09-09 DIAGNOSIS — W19XXXD Unspecified fall, subsequent encounter: Secondary | ICD-10-CM | POA: Diagnosis not present

## 2023-09-09 DIAGNOSIS — J45909 Unspecified asthma, uncomplicated: Secondary | ICD-10-CM | POA: Diagnosis not present

## 2023-09-09 DIAGNOSIS — Z9181 History of falling: Secondary | ICD-10-CM | POA: Diagnosis not present

## 2023-09-09 DIAGNOSIS — S82111D Displaced fracture of right tibial spine, subsequent encounter for closed fracture with routine healing: Secondary | ICD-10-CM | POA: Diagnosis not present

## 2023-09-09 DIAGNOSIS — Z9049 Acquired absence of other specified parts of digestive tract: Secondary | ICD-10-CM | POA: Diagnosis not present

## 2023-09-09 DIAGNOSIS — H547 Unspecified visual loss: Secondary | ICD-10-CM | POA: Diagnosis not present

## 2023-09-09 DIAGNOSIS — Z9841 Cataract extraction status, right eye: Secondary | ICD-10-CM | POA: Diagnosis not present

## 2023-09-09 DIAGNOSIS — Z90710 Acquired absence of both cervix and uterus: Secondary | ICD-10-CM | POA: Diagnosis not present

## 2023-09-09 DIAGNOSIS — H9193 Unspecified hearing loss, bilateral: Secondary | ICD-10-CM | POA: Diagnosis not present

## 2023-09-09 DIAGNOSIS — Z9842 Cataract extraction status, left eye: Secondary | ICD-10-CM | POA: Diagnosis not present

## 2023-09-09 DIAGNOSIS — E785 Hyperlipidemia, unspecified: Secondary | ICD-10-CM | POA: Diagnosis not present

## 2023-09-09 DIAGNOSIS — Z981 Arthrodesis status: Secondary | ICD-10-CM | POA: Diagnosis not present

## 2023-09-09 DIAGNOSIS — G20A1 Parkinson's disease without dyskinesia, without mention of fluctuations: Secondary | ICD-10-CM | POA: Diagnosis not present

## 2023-09-09 DIAGNOSIS — I10 Essential (primary) hypertension: Secondary | ICD-10-CM | POA: Diagnosis not present

## 2023-09-09 DIAGNOSIS — K5900 Constipation, unspecified: Secondary | ICD-10-CM | POA: Diagnosis not present

## 2023-09-09 DIAGNOSIS — K219 Gastro-esophageal reflux disease without esophagitis: Secondary | ICD-10-CM | POA: Diagnosis not present

## 2023-09-09 DIAGNOSIS — M1990 Unspecified osteoarthritis, unspecified site: Secondary | ICD-10-CM | POA: Diagnosis not present

## 2023-09-09 DIAGNOSIS — G4733 Obstructive sleep apnea (adult) (pediatric): Secondary | ICD-10-CM | POA: Diagnosis not present

## 2023-09-12 DIAGNOSIS — R32 Unspecified urinary incontinence: Secondary | ICD-10-CM | POA: Diagnosis not present

## 2023-09-12 DIAGNOSIS — F329 Major depressive disorder, single episode, unspecified: Secondary | ICD-10-CM | POA: Diagnosis not present

## 2023-09-12 DIAGNOSIS — F419 Anxiety disorder, unspecified: Secondary | ICD-10-CM | POA: Diagnosis not present

## 2023-09-12 DIAGNOSIS — E785 Hyperlipidemia, unspecified: Secondary | ICD-10-CM | POA: Diagnosis not present

## 2023-09-12 DIAGNOSIS — G4733 Obstructive sleep apnea (adult) (pediatric): Secondary | ICD-10-CM | POA: Diagnosis not present

## 2023-09-12 DIAGNOSIS — Z9181 History of falling: Secondary | ICD-10-CM | POA: Diagnosis not present

## 2023-09-12 DIAGNOSIS — S82111D Displaced fracture of right tibial spine, subsequent encounter for closed fracture with routine healing: Secondary | ICD-10-CM | POA: Diagnosis not present

## 2023-09-12 DIAGNOSIS — J45909 Unspecified asthma, uncomplicated: Secondary | ICD-10-CM | POA: Diagnosis not present

## 2023-09-12 DIAGNOSIS — H547 Unspecified visual loss: Secondary | ICD-10-CM | POA: Diagnosis not present

## 2023-09-12 DIAGNOSIS — K5900 Constipation, unspecified: Secondary | ICD-10-CM | POA: Diagnosis not present

## 2023-09-12 DIAGNOSIS — Z9842 Cataract extraction status, left eye: Secondary | ICD-10-CM | POA: Diagnosis not present

## 2023-09-12 DIAGNOSIS — K219 Gastro-esophageal reflux disease without esophagitis: Secondary | ICD-10-CM | POA: Diagnosis not present

## 2023-09-12 DIAGNOSIS — W19XXXD Unspecified fall, subsequent encounter: Secondary | ICD-10-CM | POA: Diagnosis not present

## 2023-09-12 DIAGNOSIS — Z981 Arthrodesis status: Secondary | ICD-10-CM | POA: Diagnosis not present

## 2023-09-12 DIAGNOSIS — G20A1 Parkinson's disease without dyskinesia, without mention of fluctuations: Secondary | ICD-10-CM | POA: Diagnosis not present

## 2023-09-12 DIAGNOSIS — Z9049 Acquired absence of other specified parts of digestive tract: Secondary | ICD-10-CM | POA: Diagnosis not present

## 2023-09-12 DIAGNOSIS — H9193 Unspecified hearing loss, bilateral: Secondary | ICD-10-CM | POA: Diagnosis not present

## 2023-09-12 DIAGNOSIS — Z9841 Cataract extraction status, right eye: Secondary | ICD-10-CM | POA: Diagnosis not present

## 2023-09-12 DIAGNOSIS — M1990 Unspecified osteoarthritis, unspecified site: Secondary | ICD-10-CM | POA: Diagnosis not present

## 2023-09-12 DIAGNOSIS — I10 Essential (primary) hypertension: Secondary | ICD-10-CM | POA: Diagnosis not present

## 2023-09-12 DIAGNOSIS — Z87891 Personal history of nicotine dependence: Secondary | ICD-10-CM | POA: Diagnosis not present

## 2023-09-12 DIAGNOSIS — R911 Solitary pulmonary nodule: Secondary | ICD-10-CM | POA: Diagnosis not present

## 2023-09-12 DIAGNOSIS — Z90710 Acquired absence of both cervix and uterus: Secondary | ICD-10-CM | POA: Diagnosis not present

## 2023-09-15 ENCOUNTER — Encounter

## 2023-09-15 ENCOUNTER — Telehealth: Admitting: Nurse Practitioner

## 2023-09-15 ENCOUNTER — Telehealth: Payer: Self-pay | Admitting: Nurse Practitioner

## 2023-09-15 DIAGNOSIS — S82101A Unspecified fracture of upper end of right tibia, initial encounter for closed fracture: Secondary | ICD-10-CM | POA: Diagnosis not present

## 2023-09-15 NOTE — Telephone Encounter (Signed)
 Received PT orders from [facility]. Gave to Alyssa for signatures-Toni

## 2023-09-16 DIAGNOSIS — S82111D Displaced fracture of right tibial spine, subsequent encounter for closed fracture with routine healing: Secondary | ICD-10-CM | POA: Diagnosis not present

## 2023-09-16 DIAGNOSIS — Z90710 Acquired absence of both cervix and uterus: Secondary | ICD-10-CM | POA: Diagnosis not present

## 2023-09-16 DIAGNOSIS — Z9049 Acquired absence of other specified parts of digestive tract: Secondary | ICD-10-CM | POA: Diagnosis not present

## 2023-09-16 DIAGNOSIS — K219 Gastro-esophageal reflux disease without esophagitis: Secondary | ICD-10-CM | POA: Diagnosis not present

## 2023-09-16 DIAGNOSIS — Z9842 Cataract extraction status, left eye: Secondary | ICD-10-CM | POA: Diagnosis not present

## 2023-09-16 DIAGNOSIS — W19XXXD Unspecified fall, subsequent encounter: Secondary | ICD-10-CM | POA: Diagnosis not present

## 2023-09-16 DIAGNOSIS — G20A1 Parkinson's disease without dyskinesia, without mention of fluctuations: Secondary | ICD-10-CM | POA: Diagnosis not present

## 2023-09-16 DIAGNOSIS — F419 Anxiety disorder, unspecified: Secondary | ICD-10-CM | POA: Diagnosis not present

## 2023-09-16 DIAGNOSIS — M1990 Unspecified osteoarthritis, unspecified site: Secondary | ICD-10-CM | POA: Diagnosis not present

## 2023-09-16 DIAGNOSIS — H9193 Unspecified hearing loss, bilateral: Secondary | ICD-10-CM | POA: Diagnosis not present

## 2023-09-16 DIAGNOSIS — Z9841 Cataract extraction status, right eye: Secondary | ICD-10-CM | POA: Diagnosis not present

## 2023-09-16 DIAGNOSIS — H547 Unspecified visual loss: Secondary | ICD-10-CM | POA: Diagnosis not present

## 2023-09-16 DIAGNOSIS — Z981 Arthrodesis status: Secondary | ICD-10-CM | POA: Diagnosis not present

## 2023-09-16 DIAGNOSIS — Z9181 History of falling: Secondary | ICD-10-CM | POA: Diagnosis not present

## 2023-09-16 DIAGNOSIS — R32 Unspecified urinary incontinence: Secondary | ICD-10-CM | POA: Diagnosis not present

## 2023-09-16 DIAGNOSIS — J45909 Unspecified asthma, uncomplicated: Secondary | ICD-10-CM | POA: Diagnosis not present

## 2023-09-16 DIAGNOSIS — Z87891 Personal history of nicotine dependence: Secondary | ICD-10-CM | POA: Diagnosis not present

## 2023-09-16 DIAGNOSIS — I10 Essential (primary) hypertension: Secondary | ICD-10-CM | POA: Diagnosis not present

## 2023-09-16 DIAGNOSIS — R911 Solitary pulmonary nodule: Secondary | ICD-10-CM | POA: Diagnosis not present

## 2023-09-16 DIAGNOSIS — E785 Hyperlipidemia, unspecified: Secondary | ICD-10-CM | POA: Diagnosis not present

## 2023-09-16 DIAGNOSIS — G4733 Obstructive sleep apnea (adult) (pediatric): Secondary | ICD-10-CM | POA: Diagnosis not present

## 2023-09-16 DIAGNOSIS — F329 Major depressive disorder, single episode, unspecified: Secondary | ICD-10-CM | POA: Diagnosis not present

## 2023-09-16 DIAGNOSIS — K5900 Constipation, unspecified: Secondary | ICD-10-CM | POA: Diagnosis not present

## 2023-09-17 ENCOUNTER — Telehealth (INDEPENDENT_AMBULATORY_CARE_PROVIDER_SITE_OTHER): Admitting: Nurse Practitioner

## 2023-09-17 ENCOUNTER — Encounter: Payer: Self-pay | Admitting: Nurse Practitioner

## 2023-09-17 VITALS — Ht 62.0 in | Wt 145.0 lb

## 2023-09-17 DIAGNOSIS — I10 Essential (primary) hypertension: Secondary | ICD-10-CM

## 2023-09-17 DIAGNOSIS — N3946 Mixed incontinence: Secondary | ICD-10-CM | POA: Diagnosis not present

## 2023-09-17 DIAGNOSIS — M150 Primary generalized (osteo)arthritis: Secondary | ICD-10-CM

## 2023-09-17 DIAGNOSIS — E782 Mixed hyperlipidemia: Secondary | ICD-10-CM

## 2023-09-17 DIAGNOSIS — F331 Major depressive disorder, recurrent, moderate: Secondary | ICD-10-CM

## 2023-09-17 DIAGNOSIS — Z79899 Other long term (current) drug therapy: Secondary | ICD-10-CM

## 2023-09-17 MED ORDER — METOPROLOL TARTRATE 25 MG PO TABS: ORAL_TABLET | ORAL | 1 refills | Status: AC

## 2023-09-17 MED ORDER — ESCITALOPRAM OXALATE 20 MG PO TABS: 20.0000 mg | ORAL_TABLET | Freq: Every day | ORAL | 1 refills | Status: AC

## 2023-09-17 MED ORDER — SIMVASTATIN 20 MG PO TABS: ORAL_TABLET | ORAL | 1 refills | Status: AC

## 2023-09-17 MED ORDER — LOSARTAN POTASSIUM 100 MG PO TABS: 100.0000 mg | ORAL_TABLET | Freq: Every day | ORAL | 1 refills | Status: AC

## 2023-09-17 MED ORDER — HYDROCODONE-ACETAMINOPHEN 5-325 MG PO TABS: 1.0000 | ORAL_TABLET | Freq: Every day | ORAL | 0 refills | Status: DC | PRN

## 2023-09-17 MED ORDER — BUPROPION HCL ER (XL) 150 MG PO TB24: 150.0000 mg | ORAL_TABLET | Freq: Every day | ORAL | 1 refills | Status: AC

## 2023-09-17 MED ORDER — PANTOPRAZOLE SODIUM 40 MG PO TBEC: 40.0000 mg | DELAYED_RELEASE_TABLET | Freq: Two times a day (BID) | ORAL | 1 refills | Status: AC

## 2023-09-17 MED ORDER — OXYBUTYNIN CHLORIDE ER 10 MG PO TB24: 20.0000 mg | ORAL_TABLET | Freq: Every day | ORAL | 1 refills | Status: AC

## 2023-09-17 MED ORDER — AMLODIPINE BESYLATE 2.5 MG PO TABS: ORAL_TABLET | ORAL | 2 refills | Status: AC

## 2023-09-17 MED ORDER — HYDROCODONE-ACETAMINOPHEN 5-325 MG PO TABS
1.0000 | ORAL_TABLET | Freq: Every day | ORAL | 0 refills | Status: DC | PRN
Start: 2023-11-12 — End: 2023-11-12

## 2023-09-17 NOTE — Progress Notes (Signed)
 [facility] [address] [place], [place] [address]  Internal MEDICINE  Telephone Visit  Patient Name: Susan Shah  [id number]  [id number]  Date of Service: 09/17/2023  I connected with the patient at 1204 by telephone and verified the patients identity using two identifiers.   I discussed the limitations, risks, security and privacy concerns of performing an evaluation and management service by telephone and the availability of in person appointments. I also discussed with the patient that there may be a patient responsible charge related to the service.  The patient expressed understanding and agrees to proceed.    Chief Complaint  Patient presents with   Telephone Assessment     [id number]   Medication Refill   Depression   Hypertension    HPI Jakerria presents for a telehealth virtual visit for chronic pain and medication refills.  Patient recently broke her leg and is having difficulty with ambulation so we are doing this visit virtually today.  Chronic pain -- takes hydrocodone  as needed.  Constipation -- has not tried anything for this. Most likely opioid induced. Reports hard stools and having difficulty having a BM, having to strain Low vitamin D    Current Medication: Outpatient Encounter Medications as of 09/17/2023  Medication Sig   acetaminophen  (TYLENOL ) 500 MG tablet Take 2 tablets (1,000 mg total) by mouth every 8 (eight) hours as needed for headache or mild pain (pain score 1-3).   Carbidopa -Levodopa  ER (SINEMET  CR) 25-100 MG tablet controlled release Take 1 tablet by mouth 3 (three) times daily.   Carboxymethylcellul-Glycerin (LUBRICATING EYE DROPS OP) Place 1 drop into both eyes daily as needed (dry eyes).   cholecalciferol  (VITAMIN D3) 25 MCG (1000 UNIT) tablet Take 1,000 Units by mouth daily.   enoxaparin  (LOVENOX ) 40 MG/0.4ML injection Inject 0.4 mLs (40 mg total) into the skin daily for 14 days.   famotidine  (PEPCID ) 40 MG tablet TAKE 1 TABLET(40  MG) BY MOUTH TWICE DAILY (Patient taking differently: Take 40 mg by mouth 2 (two) times daily as needed.)   fluticasone  (FLONASE ) 50 MCG/ACT nasal spray Place 1 spray into both nostrils at bedtime as needed for allergies or rhinitis.   loratadine  (QC LORATADINE  ALLERGY RELIEF) 10 MG tablet Take 1 tablet (10 mg total) by mouth daily.   neomycin -polymyxin b -dexamethasone  (MAXITROL ) 3.5-10000-0.1 SUSP Place 1 drop into both eyes every 6 (six) hours.   ondansetron  (ZOFRAN -ODT) 4 MG disintegrating tablet Take 1 tablet (4 mg total) by mouth every 8 (eight) hours as needed for nausea or vomiting.   rOPINIRole  (REQUIP ) 0.25 MG tablet Take by mouth.   tiZANidine  (ZANAFLEX ) 4 MG tablet Take 4 mg by mouth 3 (three) times daily as needed.   vitamin B-12 (CYANOCOBALAMIN ) 1000 MCG tablet Take 1,000 mcg by mouth daily.   [DISCONTINUED] amLODipine  (NORVASC ) 2.5 MG tablet TAKE 1 TABLET BY MOUTH EVERY DAY- STOP LISINOPRIL    [DISCONTINUED] buPROPion  (WELLBUTRIN  XL) 150 MG 24 hr tablet Take 1 tablet (150 mg total) by mouth daily.   [DISCONTINUED] escitalopram  (LEXAPRO ) 20 MG tablet Take 1 tablet (20 mg total) by mouth daily.   [DISCONTINUED] HYDROcodone -acetaminophen  (NORCO/VICODIN) 5-325 MG tablet Take 1-2 tablets by mouth every 4 (four) hours as needed for moderate pain (pain score 4-6) or severe pain (pain score 7-10).   [DISCONTINUED] losartan  (COZAAR ) 100 MG tablet Take 1 tablet (100 mg total) by mouth daily.   [DISCONTINUED] metoprolol  tartrate (LOPRESSOR ) 25 MG tablet TAKE 1 TABLET (25 MG TOTAL) BY MOUTH TWICE A DAY WITH MEALS   [  DISCONTINUED] oxybutynin  (DITROPAN -XL) 10 MG 24 hr tablet Take 2 tablets (20 mg total) by mouth daily.   [DISCONTINUED] pantoprazole  (PROTONIX ) 40 MG tablet Take 1 tablet (40 mg total) by mouth 2 (two) times daily.   [DISCONTINUED] simvastatin  (ZOCOR ) 20 MG tablet TAKE 1 TABLET BY MOUTH DAILY AT 6 PM.   amLODipine  (NORVASC ) 2.5 MG tablet TAKE 1 TABLET BY MOUTH EVERY DAY- STOP LISINOPRIL     buPROPion  (WELLBUTRIN  XL) 150 MG 24 hr tablet Take 1 tablet (150 mg total) by mouth daily.   escitalopram  (LEXAPRO ) 20 MG tablet Take 1 tablet (20 mg total) by mouth daily.   HYDROcodone -acetaminophen  (NORCO/VICODIN) 5-325 MG tablet Take 1 tablet by mouth daily as needed for moderate pain (pain score 4-6) or severe pain (pain score 7-10).   [START ON 11/12/2023] HYDROcodone -acetaminophen  (NORCO/VICODIN) 5-325 MG tablet Take 1 tablet by mouth daily as needed for moderate pain (pain score 4-6) or severe pain (pain score 7-10).   [START ON 10/15/2023] HYDROcodone -acetaminophen  (NORCO/VICODIN) 5-325 MG tablet Take 1 tablet by mouth daily as needed for moderate pain (pain score 4-6) or severe pain (pain score 7-10).   losartan  (COZAAR ) 100 MG tablet Take 1 tablet (100 mg total) by mouth daily.   metoprolol  tartrate (LOPRESSOR ) 25 MG tablet TAKE 1 TABLET (25 MG TOTAL) BY MOUTH TWICE A DAY WITH MEALS   oxybutynin  (DITROPAN -XL) 10 MG 24 hr tablet Take 2 tablets (20 mg total) by mouth daily.   pantoprazole  (PROTONIX ) 40 MG tablet Take 1 tablet (40 mg total) by mouth 2 (two) times daily.   simvastatin  (ZOCOR ) 20 MG tablet TAKE 1 TABLET BY MOUTH DAILY AT 6 PM.   [DISCONTINUED] HYDROcodone -acetaminophen  (NORCO/VICODIN) 5-325 MG tablet Take 1 tablet by mouth daily as needed for moderate pain (pain score 4-6) or severe pain (pain score 7-10).   [DISCONTINUED] HYDROcodone -acetaminophen  (NORCO/VICODIN) 5-325 MG tablet Take 1 tablet by mouth daily as needed for moderate pain (pain score 4-6) or severe pain (pain score 7-10).   [DISCONTINUED] HYDROcodone -acetaminophen  (NORCO/VICODIN) 5-325 MG tablet Take 1 tablet by mouth daily as needed for moderate pain (pain score 4-6) or severe pain (pain score 7-10).   No facility-administered encounter medications on file as of 09/17/2023.    Surgical History: Past Surgical History:  Procedure Laterality Date   50 HOUR PH STUDY N/A 04/24/2016   Procedure: 24 HOUR PH STUDY;   Surgeon: Rogelia Copping, MD;  Location: [facility] ENDOSCOPY;  Service: Endoscopy;  Laterality: N/A;   ABDOMINAL HYSTERECTOMY     Total   APPENDECTOMY     CARPOMETACARPAL (CMC) FUSION OF THUMB Left 05/16/2016   Procedure: CARPOMETACARPAL [facility]) FUSION OF THUMB;  Surgeon: Ozell Flake, MD;  Location: [facility] ORS;  Service: Orthopedics;  Laterality: Left;   CARPOMETACARPAL (CMC) FUSION OF THUMB Left 03/30/2019   Procedure: LEFT THUMB SUSPENSION PLASTY;  Surgeon: Flake Ozell, MD;  Location: [facility] ORS;  Service: Orthopedics;  Laterality: Left;   CATARACT EXTRACTION Bilateral 12/2012   CHOLECYSTECTOMY     COLONOSCOPY  2003   ESOPHAGEAL MANOMETRY N/A 04/24/2016   Procedure: ESOPHAGEAL MANOMETRY (EM);  Surgeon: Rogelia Copping, MD;  Location: [facility] ENDOSCOPY;  Service: Endoscopy;  Laterality: N/A;   ESOPHAGOGASTRODUODENOSCOPY  08/2011   FOOT SURGERY     HALLUX VALGUS CORRECTION     HIP SURGERY Right 1950   tendon release r/t polio   KNEE ARTHROSCOPY Left 06/23/2008   RETINAL DETACHMENT SURGERY Right 03/2014   ROTATOR CUFF REPAIR Bilateral    URINARY SURGERY  2014   [place]   Medical History: Past Medical History:  Diagnosis Date   Anxiety    Asthma 2015   mild, seasonal allergy triggered.   Cancer (HCC) 2013   skin cancer  on left hand   Depression    Esophageal dysmotility    GERD (gastroesophageal reflux disease) 11/05/2012   History of hiatal hernia    Hyperlipidemia    Hypertension    Incontinence of urine    Lung nodule    OA (osteoarthritis)    Obesity    Parkinson's disease (HCC)    PONV (postoperative nausea and vomiting)    Post-polio syndrome    contracted at 68 months old   Pulmonary nodule 11/05/2012   RML   Shingles 2009   Sleep apnea     Family History: Family History  Problem Relation Age of Onset   Heart disease Mother    Heart disease Father    Heart attack Sister    Heart disease Brother    Heart attack Brother    Breast cancer Maternal Aunt    Stroke Maternal  Grandmother    Heart disease Maternal Grandfather     Social History   Socioeconomic History   Marital status: Married    Spouse name: Not on file   Number of children: Not on file   Years of education: Not on file   Highest education level: Not on file  Occupational History   Not on file  Tobacco Use   Smoking status: Former    Current packs/day: 0.00    Types: Cigarettes    Quit date: 05/09/1968    Years since quitting: 55.3   Smokeless tobacco: Never  Vaping Use   Vaping status: Never Used  Substance and Sexual Activity   Alcohol  use: No   Drug use: No   Sexual activity: Not on file  Other Topics Concern   Not on file  Social History Narrative   Not on file   Social Drivers of Health   Financial Resource Strain: Medium Risk (08/27/2023)   Received from [facility]   Overall Financial Resource Strain (CARDIA)    Difficulty of Paying Living Expenses: Somewhat hard  Food Insecurity: Food Insecurity Present (08/27/2023)   Received from [facility]   Hunger Vital Sign    Within the past 12 months, you worried that your food would run out before you got the money to buy more.: Sometimes true    Within the past 12 months, the food you bought just didn't last and you didn't have money to get more.: Never true  Transportation Needs: No Transportation Needs (08/27/2023)   Received from [facility] - Transportation    In the past 12 months, has lack of transportation kept you from medical appointments or from getting medications?: No    Lack of Transportation (Non-Medical): No  Physical Activity: Not on file  Stress: Not on file  Social Connections: Patient Unable To Answer (08/10/2023)   Social Connection and Isolation Panel    Frequency of Communication with Friends and Family: Patient unable to answer    Frequency of Social Gatherings with Friends and Family: Patient unable to answer    Attends Religious  Services: Patient unable to answer    Active Member of Clubs or Organizations: Patient unable to answer    Attends Banker Meetings: Patient unable to answer    Marital Status: Patient unable to answer  Intimate Partner Violence: Unknown (08/10/2023)  Humiliation, Afraid, Rape, and Kick questionnaire    Fear of Current or Ex-Partner: No    Emotionally Abused: Patient declined    Physically Abused: No    Sexually Abused: No      Review of Systems  Constitutional:  Negative for appetite change, chills, fatigue and unexpected weight change.  HENT:  Negative for congestion, rhinorrhea, sneezing and sore throat.   Eyes:  Negative for redness.  Respiratory: Negative.  Negative for cough, chest tightness, shortness of breath and wheezing.   Cardiovascular: Negative.  Negative for chest pain and palpitations.  Gastrointestinal:  Negative for abdominal pain, constipation, diarrhea and nausea.  Genitourinary:  Negative for dysuria and frequency.  Musculoskeletal:  Positive for arthralgias and gait problem. Negative for back pain, joint swelling and neck pain.  Skin:  Negative for rash.  Neurological:  Negative for tremors and numbness.  Hematological:  Negative for adenopathy. Does not bruise/bleed easily.  Psychiatric/Behavioral:  Negative for behavioral problems (Depression), sleep disturbance and suicidal ideas. The patient is not nervous/anxious.     Vital Signs: Ht 5' 2 (1.575 m)   Wt 145 lb (65.8 kg)   BMI 26.52 kg/m    Observation/Objective: She is alert and oriented. No acute distress noted.     Assessment/Plan: 1. Essential hypertension Continue medications as prescribed.  - amLODipine  (NORVASC ) 2.5 MG tablet; TAKE 1 TABLET BY MOUTH EVERY DAY- STOP LISINOPRIL   Dispense: 90 tablet; Refill: 2 - losartan  (COZAAR ) 100 MG tablet; Take 1 tablet (100 mg total) by mouth daily.  Dispense: 90 tablet; Refill: 1 - metoprolol  tartrate (LOPRESSOR ) 25 MG tablet; TAKE 1  TABLET (25 MG TOTAL) BY MOUTH TWICE A DAY WITH MEALS  Dispense: 180 tablet; Refill: 1  2. Mixed stress and urge urinary incontinence Continue oxybutynin  as prescribed.  - oxybutynin  (DITROPAN -XL) 10 MG 24 hr tablet; Take 2 tablets (20 mg total) by mouth daily.  Dispense: 180 tablet; Refill: 1  3. Mixed hyperlipidemia Continue simvastatin  as prescribed  - simvastatin  (ZOCOR ) 20 MG tablet; TAKE 1 TABLET BY MOUTH DAILY AT 6 PM.  Dispense: 90 tablet; Refill: 1  4. Encounter for medication review Medication list reviewed, updated and refills ordered  - escitalopram  (LEXAPRO ) 20 MG tablet; Take 1 tablet (20 mg total) by mouth daily.  Dispense: 90 tablet; Refill: 1 - pantoprazole  (PROTONIX ) 40 MG tablet; Take 1 tablet (40 mg total) by mouth 2 (two) times daily.  Dispense: 180 tablet; Refill: 1  5. Moderate episode of recurrent major depressive disorder (HCC) Continue bupropion  as prescribed.  - buPROPion  (WELLBUTRIN  XL) 150 MG 24 hr tablet; Take 1 tablet (150 mg total) by mouth daily.  Dispense: 90 tablet; Refill: 1  6. Primary osteoarthritis involving multiple joints (Primary) Continue prn hydrocodone  as prescribed. Follow up in 3 months for additional refills.  - HYDROcodone -acetaminophen  (NORCO/VICODIN) 5-325 MG tablet; Take 1 tablet by mouth daily as needed for moderate pain (pain score 4-6) or severe pain (pain score 7-10).  Dispense: 30 tablet; Refill: 0 - HYDROcodone -acetaminophen  (NORCO/VICODIN) 5-325 MG tablet; Take 1 tablet by mouth daily as needed for moderate pain (pain score 4-6) or severe pain (pain score 7-10).  Dispense: 30 tablet; Refill: 0 - HYDROcodone -acetaminophen  (NORCO/VICODIN) 5-325 MG tablet; Take 1 tablet by mouth daily as needed for moderate pain (pain score 4-6) or severe pain (pain score 7-10).  Dispense: 30 tablet; Refill: 0   General Counseling: Mashawn verbalizes understanding of the findings of today's phone visit and agrees with plan of treatment. I have discussed  any further diagnostic evaluation that may be needed or ordered today. We also reviewed her medications today. she has been encouraged to call the office with any questions or concerns that should arise related to todays visit.  Return in about 12 weeks (around 12/10/2023) for F/U, pain med refill, Kaleen Rochette PCP.   No orders of the defined types were placed in this encounter.   Meds ordered this encounter  Medications   amLODipine  (NORVASC ) 2.5 MG tablet    Sig: TAKE 1 TABLET BY MOUTH EVERY DAY- STOP LISINOPRIL     Dispense:  90 tablet    Refill:  2    For future refills   buPROPion  (WELLBUTRIN  XL) 150 MG 24 hr tablet    Sig: Take 1 tablet (150 mg total) by mouth daily.    Dispense:  90 tablet    Refill:  1    For future refills   escitalopram  (LEXAPRO ) 20 MG tablet    Sig: Take 1 tablet (20 mg total) by mouth daily.    Dispense:  90 tablet    Refill:  1   DISCONTD: HYDROcodone -acetaminophen  (NORCO/VICODIN) 5-325 MG tablet    Sig: Take 1 tablet by mouth daily as needed for moderate pain (pain score 4-6) or severe pain (pain score 7-10).    Dispense:  30 tablet    Refill:  0    This is a chronic medication taken daily as needed, please note changes in prescription back to her normal chronic order. Fill for now. *THIS IS NOT A DUPLICATE ORDER*   losartan  (COZAAR ) 100 MG tablet    Sig: Take 1 tablet (100 mg total) by mouth daily.    Dispense:  90 tablet    Refill:  1    For future refills   metoprolol  tartrate (LOPRESSOR ) 25 MG tablet    Sig: TAKE 1 TABLET (25 MG TOTAL) BY MOUTH TWICE A DAY WITH MEALS    Dispense:  180 tablet    Refill:  1    For future refills   oxybutynin  (DITROPAN -XL) 10 MG 24 hr tablet    Sig: Take 2 tablets (20 mg total) by mouth daily.    Dispense:  180 tablet    Refill:  1    For future refills   pantoprazole  (PROTONIX ) 40 MG tablet    Sig: Take 1 tablet (40 mg total) by mouth 2 (two) times daily.    Dispense:  180 tablet    Refill:  1   simvastatin   (ZOCOR ) 20 MG tablet    Sig: TAKE 1 TABLET BY MOUTH DAILY AT 6 PM.    Dispense:  90 tablet    Refill:  1    For future refills   DISCONTD: HYDROcodone -acetaminophen  (NORCO/VICODIN) 5-325 MG tablet    Sig: Take 1 tablet by mouth daily as needed for moderate pain (pain score 4-6) or severe pain (pain score 7-10).    Dispense:  30 tablet    Refill:  0    This is a chronic medication taken daily as needed, please note changes in prescription back to her normal chronic order. Fill for September. *THIS IS NOT A DUPLICATE ORDER*   DISCONTD: HYDROcodone -acetaminophen  (NORCO/VICODIN) 5-325 MG tablet    Sig: Take 1 tablet by mouth daily as needed for moderate pain (pain score 4-6) or severe pain (pain score 7-10).    Dispense:  30 tablet    Refill:  0    This is a chronic medication taken daily as needed, please note  changes in prescription back to her normal chronic order. Fill for October. *THIS IS NOT A DUPLICATE ORDER*   HYDROcodone -acetaminophen  (NORCO/VICODIN) 5-325 MG tablet    Sig: Take 1 tablet by mouth daily as needed for moderate pain (pain score 4-6) or severe pain (pain score 7-10).    Dispense:  30 tablet    Refill:  0    This is a chronic medication taken daily as needed, please note changes in prescription back to her normal chronic order. Fill for now. *THIS IS NOT A DUPLICATE ORDER*   HYDROcodone -acetaminophen  (NORCO/VICODIN) 5-325 MG tablet    Sig: Take 1 tablet by mouth daily as needed for moderate pain (pain score 4-6) or severe pain (pain score 7-10).    Dispense:  30 tablet    Refill:  0    This is a chronic medication taken daily as needed, please note changes in prescription back to her normal chronic order. Fill for October. *THIS IS NOT A DUPLICATE ORDER*   HYDROcodone -acetaminophen  (NORCO/VICODIN) 5-325 MG tablet    Sig: Take 1 tablet by mouth daily as needed for moderate pain (pain score 4-6) or severe pain (pain score 7-10).    Dispense:  30 tablet    Refill:  0     This is a chronic medication taken daily as needed, please note changes in prescription back to her normal chronic order. Fill for September. *THIS IS NOT A DUPLICATE ORDER*    Time spent:30 Minutes Time spent with patient included reviewing progress notes, labs, imaging studies, and discussing plan for follow up.  [place] Controlled Substance Database was reviewed by me for overdose risk score (ORS) if appropriate.  This patient was seen by Mardy Maxin, FNP-C in collaboration with Dr. Sigrid Bathe as a part of collaborative care agreement.  Hikaru Delorenzo R. Maxin, MSN, FNP-C Internal medicine

## 2023-09-19 ENCOUNTER — Encounter: Payer: Self-pay | Admitting: Nurse Practitioner

## 2023-09-19 ENCOUNTER — Telehealth: Payer: Self-pay | Admitting: Nurse Practitioner

## 2023-09-19 NOTE — Telephone Encounter (Signed)
 Received 09/18/23 PT orders from [facility]. Gave to Alyssa for signature-Toni

## 2023-09-23 ENCOUNTER — Telehealth: Payer: Self-pay | Admitting: Nurse Practitioner

## 2023-09-23 DIAGNOSIS — G4733 Obstructive sleep apnea (adult) (pediatric): Secondary | ICD-10-CM | POA: Diagnosis not present

## 2023-09-23 DIAGNOSIS — Z9842 Cataract extraction status, left eye: Secondary | ICD-10-CM | POA: Diagnosis not present

## 2023-09-23 DIAGNOSIS — W19XXXD Unspecified fall, subsequent encounter: Secondary | ICD-10-CM | POA: Diagnosis not present

## 2023-09-23 DIAGNOSIS — I10 Essential (primary) hypertension: Secondary | ICD-10-CM | POA: Diagnosis not present

## 2023-09-23 DIAGNOSIS — H9193 Unspecified hearing loss, bilateral: Secondary | ICD-10-CM | POA: Diagnosis not present

## 2023-09-23 DIAGNOSIS — R32 Unspecified urinary incontinence: Secondary | ICD-10-CM | POA: Diagnosis not present

## 2023-09-23 DIAGNOSIS — M1990 Unspecified osteoarthritis, unspecified site: Secondary | ICD-10-CM | POA: Diagnosis not present

## 2023-09-23 DIAGNOSIS — Z981 Arthrodesis status: Secondary | ICD-10-CM | POA: Diagnosis not present

## 2023-09-23 DIAGNOSIS — Z90710 Acquired absence of both cervix and uterus: Secondary | ICD-10-CM | POA: Diagnosis not present

## 2023-09-23 DIAGNOSIS — F329 Major depressive disorder, single episode, unspecified: Secondary | ICD-10-CM | POA: Diagnosis not present

## 2023-09-23 DIAGNOSIS — R911 Solitary pulmonary nodule: Secondary | ICD-10-CM | POA: Diagnosis not present

## 2023-09-23 DIAGNOSIS — Z9049 Acquired absence of other specified parts of digestive tract: Secondary | ICD-10-CM | POA: Diagnosis not present

## 2023-09-23 DIAGNOSIS — F419 Anxiety disorder, unspecified: Secondary | ICD-10-CM | POA: Diagnosis not present

## 2023-09-23 DIAGNOSIS — Z87891 Personal history of nicotine dependence: Secondary | ICD-10-CM | POA: Diagnosis not present

## 2023-09-23 DIAGNOSIS — E785 Hyperlipidemia, unspecified: Secondary | ICD-10-CM | POA: Diagnosis not present

## 2023-09-23 DIAGNOSIS — H547 Unspecified visual loss: Secondary | ICD-10-CM | POA: Diagnosis not present

## 2023-09-23 DIAGNOSIS — K219 Gastro-esophageal reflux disease without esophagitis: Secondary | ICD-10-CM | POA: Diagnosis not present

## 2023-09-23 DIAGNOSIS — K5900 Constipation, unspecified: Secondary | ICD-10-CM | POA: Diagnosis not present

## 2023-09-23 DIAGNOSIS — G20A1 Parkinson's disease without dyskinesia, without mention of fluctuations: Secondary | ICD-10-CM | POA: Diagnosis not present

## 2023-09-23 DIAGNOSIS — Z9181 History of falling: Secondary | ICD-10-CM | POA: Diagnosis not present

## 2023-09-23 DIAGNOSIS — J45909 Unspecified asthma, uncomplicated: Secondary | ICD-10-CM | POA: Diagnosis not present

## 2023-09-23 DIAGNOSIS — S82111D Displaced fracture of right tibial spine, subsequent encounter for closed fracture with routine healing: Secondary | ICD-10-CM | POA: Diagnosis not present

## 2023-09-23 DIAGNOSIS — Z9841 Cataract extraction status, right eye: Secondary | ICD-10-CM | POA: Diagnosis not present

## 2023-09-23 NOTE — Telephone Encounter (Signed)
 09/18/23 PT orders completed, faxed back to [facility]; [phone]. Scanned-Toni

## 2023-09-23 NOTE — Telephone Encounter (Signed)
 09/18/23 PT orders completed. Faxed back to [facility]; [phone]. Scanned

## 2023-09-26 DIAGNOSIS — Z9181 History of falling: Secondary | ICD-10-CM | POA: Diagnosis not present

## 2023-09-26 DIAGNOSIS — Z9841 Cataract extraction status, right eye: Secondary | ICD-10-CM | POA: Diagnosis not present

## 2023-09-26 DIAGNOSIS — F329 Major depressive disorder, single episode, unspecified: Secondary | ICD-10-CM | POA: Diagnosis not present

## 2023-09-26 DIAGNOSIS — H9193 Unspecified hearing loss, bilateral: Secondary | ICD-10-CM | POA: Diagnosis not present

## 2023-09-26 DIAGNOSIS — G4733 Obstructive sleep apnea (adult) (pediatric): Secondary | ICD-10-CM | POA: Diagnosis not present

## 2023-09-26 DIAGNOSIS — S82111D Displaced fracture of right tibial spine, subsequent encounter for closed fracture with routine healing: Secondary | ICD-10-CM | POA: Diagnosis not present

## 2023-09-26 DIAGNOSIS — F419 Anxiety disorder, unspecified: Secondary | ICD-10-CM | POA: Diagnosis not present

## 2023-09-26 DIAGNOSIS — R32 Unspecified urinary incontinence: Secondary | ICD-10-CM | POA: Diagnosis not present

## 2023-09-26 DIAGNOSIS — Z87891 Personal history of nicotine dependence: Secondary | ICD-10-CM | POA: Diagnosis not present

## 2023-09-26 DIAGNOSIS — K219 Gastro-esophageal reflux disease without esophagitis: Secondary | ICD-10-CM | POA: Diagnosis not present

## 2023-09-26 DIAGNOSIS — Z90710 Acquired absence of both cervix and uterus: Secondary | ICD-10-CM | POA: Diagnosis not present

## 2023-09-26 DIAGNOSIS — G20A1 Parkinson's disease without dyskinesia, without mention of fluctuations: Secondary | ICD-10-CM | POA: Diagnosis not present

## 2023-09-26 DIAGNOSIS — W19XXXD Unspecified fall, subsequent encounter: Secondary | ICD-10-CM | POA: Diagnosis not present

## 2023-09-26 DIAGNOSIS — Z981 Arthrodesis status: Secondary | ICD-10-CM | POA: Diagnosis not present

## 2023-09-26 DIAGNOSIS — K5900 Constipation, unspecified: Secondary | ICD-10-CM | POA: Diagnosis not present

## 2023-09-26 DIAGNOSIS — Z9049 Acquired absence of other specified parts of digestive tract: Secondary | ICD-10-CM | POA: Diagnosis not present

## 2023-09-26 DIAGNOSIS — J45909 Unspecified asthma, uncomplicated: Secondary | ICD-10-CM | POA: Diagnosis not present

## 2023-09-26 DIAGNOSIS — E785 Hyperlipidemia, unspecified: Secondary | ICD-10-CM | POA: Diagnosis not present

## 2023-09-26 DIAGNOSIS — Z9842 Cataract extraction status, left eye: Secondary | ICD-10-CM | POA: Diagnosis not present

## 2023-09-26 DIAGNOSIS — M1990 Unspecified osteoarthritis, unspecified site: Secondary | ICD-10-CM | POA: Diagnosis not present

## 2023-09-26 DIAGNOSIS — R911 Solitary pulmonary nodule: Secondary | ICD-10-CM | POA: Diagnosis not present

## 2023-09-26 DIAGNOSIS — H547 Unspecified visual loss: Secondary | ICD-10-CM | POA: Diagnosis not present

## 2023-09-26 DIAGNOSIS — I10 Essential (primary) hypertension: Secondary | ICD-10-CM | POA: Diagnosis not present

## 2023-09-29 ENCOUNTER — Telehealth: Payer: Self-pay

## 2023-09-29 ENCOUNTER — Inpatient Hospital Stay: Admission: RE | Admit: 2023-09-29 | Source: Ambulatory Visit

## 2023-10-01 NOTE — Telephone Encounter (Signed)
 Pt notified that we can order test when able to do it

## 2023-10-03 DIAGNOSIS — R911 Solitary pulmonary nodule: Secondary | ICD-10-CM | POA: Diagnosis not present

## 2023-10-03 DIAGNOSIS — Z9841 Cataract extraction status, right eye: Secondary | ICD-10-CM | POA: Diagnosis not present

## 2023-10-03 DIAGNOSIS — Z981 Arthrodesis status: Secondary | ICD-10-CM | POA: Diagnosis not present

## 2023-10-03 DIAGNOSIS — W19XXXD Unspecified fall, subsequent encounter: Secondary | ICD-10-CM | POA: Diagnosis not present

## 2023-10-03 DIAGNOSIS — J45909 Unspecified asthma, uncomplicated: Secondary | ICD-10-CM | POA: Diagnosis not present

## 2023-10-03 DIAGNOSIS — Z9181 History of falling: Secondary | ICD-10-CM | POA: Diagnosis not present

## 2023-10-03 DIAGNOSIS — K219 Gastro-esophageal reflux disease without esophagitis: Secondary | ICD-10-CM | POA: Diagnosis not present

## 2023-10-03 DIAGNOSIS — H9193 Unspecified hearing loss, bilateral: Secondary | ICD-10-CM | POA: Diagnosis not present

## 2023-10-03 DIAGNOSIS — I10 Essential (primary) hypertension: Secondary | ICD-10-CM | POA: Diagnosis not present

## 2023-10-03 DIAGNOSIS — Z90710 Acquired absence of both cervix and uterus: Secondary | ICD-10-CM | POA: Diagnosis not present

## 2023-10-03 DIAGNOSIS — G4733 Obstructive sleep apnea (adult) (pediatric): Secondary | ICD-10-CM | POA: Diagnosis not present

## 2023-10-03 DIAGNOSIS — K5900 Constipation, unspecified: Secondary | ICD-10-CM | POA: Diagnosis not present

## 2023-10-03 DIAGNOSIS — E785 Hyperlipidemia, unspecified: Secondary | ICD-10-CM | POA: Diagnosis not present

## 2023-10-03 DIAGNOSIS — F419 Anxiety disorder, unspecified: Secondary | ICD-10-CM | POA: Diagnosis not present

## 2023-10-03 DIAGNOSIS — Z87891 Personal history of nicotine dependence: Secondary | ICD-10-CM | POA: Diagnosis not present

## 2023-10-03 DIAGNOSIS — H547 Unspecified visual loss: Secondary | ICD-10-CM | POA: Diagnosis not present

## 2023-10-03 DIAGNOSIS — R32 Unspecified urinary incontinence: Secondary | ICD-10-CM | POA: Diagnosis not present

## 2023-10-03 DIAGNOSIS — F329 Major depressive disorder, single episode, unspecified: Secondary | ICD-10-CM | POA: Diagnosis not present

## 2023-10-03 DIAGNOSIS — Z9842 Cataract extraction status, left eye: Secondary | ICD-10-CM | POA: Diagnosis not present

## 2023-10-03 DIAGNOSIS — M1990 Unspecified osteoarthritis, unspecified site: Secondary | ICD-10-CM | POA: Diagnosis not present

## 2023-10-03 DIAGNOSIS — G20A1 Parkinson's disease without dyskinesia, without mention of fluctuations: Secondary | ICD-10-CM | POA: Diagnosis not present

## 2023-10-03 DIAGNOSIS — S82111D Displaced fracture of right tibial spine, subsequent encounter for closed fracture with routine healing: Secondary | ICD-10-CM | POA: Diagnosis not present

## 2023-10-03 DIAGNOSIS — Z9049 Acquired absence of other specified parts of digestive tract: Secondary | ICD-10-CM | POA: Diagnosis not present

## 2023-10-06 ENCOUNTER — Inpatient Hospital Stay: Admission: RE | Admit: 2023-10-06 | Source: Ambulatory Visit

## 2023-10-08 DIAGNOSIS — Z9049 Acquired absence of other specified parts of digestive tract: Secondary | ICD-10-CM | POA: Diagnosis not present

## 2023-10-08 DIAGNOSIS — W19XXXD Unspecified fall, subsequent encounter: Secondary | ICD-10-CM | POA: Diagnosis not present

## 2023-10-08 DIAGNOSIS — E785 Hyperlipidemia, unspecified: Secondary | ICD-10-CM | POA: Diagnosis not present

## 2023-10-08 DIAGNOSIS — H547 Unspecified visual loss: Secondary | ICD-10-CM | POA: Diagnosis not present

## 2023-10-08 DIAGNOSIS — Z87891 Personal history of nicotine dependence: Secondary | ICD-10-CM | POA: Diagnosis not present

## 2023-10-08 DIAGNOSIS — K080 Exfoliation of teeth due to systemic causes: Secondary | ICD-10-CM | POA: Diagnosis not present

## 2023-10-08 DIAGNOSIS — R911 Solitary pulmonary nodule: Secondary | ICD-10-CM | POA: Diagnosis not present

## 2023-10-08 DIAGNOSIS — H9193 Unspecified hearing loss, bilateral: Secondary | ICD-10-CM | POA: Diagnosis not present

## 2023-10-08 DIAGNOSIS — I10 Essential (primary) hypertension: Secondary | ICD-10-CM | POA: Diagnosis not present

## 2023-10-08 DIAGNOSIS — S82111D Displaced fracture of right tibial spine, subsequent encounter for closed fracture with routine healing: Secondary | ICD-10-CM | POA: Diagnosis not present

## 2023-10-08 DIAGNOSIS — F329 Major depressive disorder, single episode, unspecified: Secondary | ICD-10-CM | POA: Diagnosis not present

## 2023-10-08 DIAGNOSIS — Z9842 Cataract extraction status, left eye: Secondary | ICD-10-CM | POA: Diagnosis not present

## 2023-10-08 DIAGNOSIS — J45909 Unspecified asthma, uncomplicated: Secondary | ICD-10-CM | POA: Diagnosis not present

## 2023-10-08 DIAGNOSIS — Z90710 Acquired absence of both cervix and uterus: Secondary | ICD-10-CM | POA: Diagnosis not present

## 2023-10-08 DIAGNOSIS — G4733 Obstructive sleep apnea (adult) (pediatric): Secondary | ICD-10-CM | POA: Diagnosis not present

## 2023-10-08 DIAGNOSIS — Z9181 History of falling: Secondary | ICD-10-CM | POA: Diagnosis not present

## 2023-10-08 DIAGNOSIS — M1990 Unspecified osteoarthritis, unspecified site: Secondary | ICD-10-CM | POA: Diagnosis not present

## 2023-10-08 DIAGNOSIS — Z9841 Cataract extraction status, right eye: Secondary | ICD-10-CM | POA: Diagnosis not present

## 2023-10-08 DIAGNOSIS — K219 Gastro-esophageal reflux disease without esophagitis: Secondary | ICD-10-CM | POA: Diagnosis not present

## 2023-10-08 DIAGNOSIS — Z981 Arthrodesis status: Secondary | ICD-10-CM | POA: Diagnosis not present

## 2023-10-08 DIAGNOSIS — G20A1 Parkinson's disease without dyskinesia, without mention of fluctuations: Secondary | ICD-10-CM | POA: Diagnosis not present

## 2023-10-08 DIAGNOSIS — F419 Anxiety disorder, unspecified: Secondary | ICD-10-CM | POA: Diagnosis not present

## 2023-10-08 DIAGNOSIS — R32 Unspecified urinary incontinence: Secondary | ICD-10-CM | POA: Diagnosis not present

## 2023-10-08 DIAGNOSIS — K5900 Constipation, unspecified: Secondary | ICD-10-CM | POA: Diagnosis not present

## 2023-10-10 DIAGNOSIS — G20A1 Parkinson's disease without dyskinesia, without mention of fluctuations: Secondary | ICD-10-CM | POA: Diagnosis not present

## 2023-10-10 DIAGNOSIS — M1990 Unspecified osteoarthritis, unspecified site: Secondary | ICD-10-CM | POA: Diagnosis not present

## 2023-10-10 DIAGNOSIS — G4733 Obstructive sleep apnea (adult) (pediatric): Secondary | ICD-10-CM | POA: Diagnosis not present

## 2023-10-10 DIAGNOSIS — H547 Unspecified visual loss: Secondary | ICD-10-CM | POA: Diagnosis not present

## 2023-10-10 DIAGNOSIS — H9193 Unspecified hearing loss, bilateral: Secondary | ICD-10-CM | POA: Diagnosis not present

## 2023-10-10 DIAGNOSIS — Z9049 Acquired absence of other specified parts of digestive tract: Secondary | ICD-10-CM | POA: Diagnosis not present

## 2023-10-10 DIAGNOSIS — K5900 Constipation, unspecified: Secondary | ICD-10-CM | POA: Diagnosis not present

## 2023-10-10 DIAGNOSIS — W19XXXD Unspecified fall, subsequent encounter: Secondary | ICD-10-CM | POA: Diagnosis not present

## 2023-10-10 DIAGNOSIS — K219 Gastro-esophageal reflux disease without esophagitis: Secondary | ICD-10-CM | POA: Diagnosis not present

## 2023-10-10 DIAGNOSIS — E785 Hyperlipidemia, unspecified: Secondary | ICD-10-CM | POA: Diagnosis not present

## 2023-10-10 DIAGNOSIS — R32 Unspecified urinary incontinence: Secondary | ICD-10-CM | POA: Diagnosis not present

## 2023-10-10 DIAGNOSIS — F329 Major depressive disorder, single episode, unspecified: Secondary | ICD-10-CM | POA: Diagnosis not present

## 2023-10-10 DIAGNOSIS — F419 Anxiety disorder, unspecified: Secondary | ICD-10-CM | POA: Diagnosis not present

## 2023-10-10 DIAGNOSIS — Z9842 Cataract extraction status, left eye: Secondary | ICD-10-CM | POA: Diagnosis not present

## 2023-10-10 DIAGNOSIS — Z87891 Personal history of nicotine dependence: Secondary | ICD-10-CM | POA: Diagnosis not present

## 2023-10-10 DIAGNOSIS — Z981 Arthrodesis status: Secondary | ICD-10-CM | POA: Diagnosis not present

## 2023-10-10 DIAGNOSIS — R911 Solitary pulmonary nodule: Secondary | ICD-10-CM | POA: Diagnosis not present

## 2023-10-10 DIAGNOSIS — S82111D Displaced fracture of right tibial spine, subsequent encounter for closed fracture with routine healing: Secondary | ICD-10-CM | POA: Diagnosis not present

## 2023-10-10 DIAGNOSIS — I10 Essential (primary) hypertension: Secondary | ICD-10-CM | POA: Diagnosis not present

## 2023-10-10 DIAGNOSIS — Z90710 Acquired absence of both cervix and uterus: Secondary | ICD-10-CM | POA: Diagnosis not present

## 2023-10-10 DIAGNOSIS — Z9841 Cataract extraction status, right eye: Secondary | ICD-10-CM | POA: Diagnosis not present

## 2023-10-10 DIAGNOSIS — Z9181 History of falling: Secondary | ICD-10-CM | POA: Diagnosis not present

## 2023-10-10 DIAGNOSIS — J45909 Unspecified asthma, uncomplicated: Secondary | ICD-10-CM | POA: Diagnosis not present

## 2023-10-14 DIAGNOSIS — F329 Major depressive disorder, single episode, unspecified: Secondary | ICD-10-CM | POA: Diagnosis not present

## 2023-10-14 DIAGNOSIS — J45909 Unspecified asthma, uncomplicated: Secondary | ICD-10-CM | POA: Diagnosis not present

## 2023-10-14 DIAGNOSIS — Z9181 History of falling: Secondary | ICD-10-CM | POA: Diagnosis not present

## 2023-10-14 DIAGNOSIS — Z9049 Acquired absence of other specified parts of digestive tract: Secondary | ICD-10-CM | POA: Diagnosis not present

## 2023-10-14 DIAGNOSIS — H9193 Unspecified hearing loss, bilateral: Secondary | ICD-10-CM | POA: Diagnosis not present

## 2023-10-14 DIAGNOSIS — Z87891 Personal history of nicotine dependence: Secondary | ICD-10-CM | POA: Diagnosis not present

## 2023-10-14 DIAGNOSIS — G4733 Obstructive sleep apnea (adult) (pediatric): Secondary | ICD-10-CM | POA: Diagnosis not present

## 2023-10-14 DIAGNOSIS — M1990 Unspecified osteoarthritis, unspecified site: Secondary | ICD-10-CM | POA: Diagnosis not present

## 2023-10-14 DIAGNOSIS — G20A1 Parkinson's disease without dyskinesia, without mention of fluctuations: Secondary | ICD-10-CM | POA: Diagnosis not present

## 2023-10-14 DIAGNOSIS — I10 Essential (primary) hypertension: Secondary | ICD-10-CM | POA: Diagnosis not present

## 2023-10-14 DIAGNOSIS — Z981 Arthrodesis status: Secondary | ICD-10-CM | POA: Diagnosis not present

## 2023-10-14 DIAGNOSIS — W19XXXD Unspecified fall, subsequent encounter: Secondary | ICD-10-CM | POA: Diagnosis not present

## 2023-10-14 DIAGNOSIS — Z9841 Cataract extraction status, right eye: Secondary | ICD-10-CM | POA: Diagnosis not present

## 2023-10-14 DIAGNOSIS — F419 Anxiety disorder, unspecified: Secondary | ICD-10-CM | POA: Diagnosis not present

## 2023-10-14 DIAGNOSIS — K5900 Constipation, unspecified: Secondary | ICD-10-CM | POA: Diagnosis not present

## 2023-10-14 DIAGNOSIS — Z90710 Acquired absence of both cervix and uterus: Secondary | ICD-10-CM | POA: Diagnosis not present

## 2023-10-14 DIAGNOSIS — K219 Gastro-esophageal reflux disease without esophagitis: Secondary | ICD-10-CM | POA: Diagnosis not present

## 2023-10-14 DIAGNOSIS — R911 Solitary pulmonary nodule: Secondary | ICD-10-CM | POA: Diagnosis not present

## 2023-10-14 DIAGNOSIS — H547 Unspecified visual loss: Secondary | ICD-10-CM | POA: Diagnosis not present

## 2023-10-14 DIAGNOSIS — Z9842 Cataract extraction status, left eye: Secondary | ICD-10-CM | POA: Diagnosis not present

## 2023-10-14 DIAGNOSIS — E785 Hyperlipidemia, unspecified: Secondary | ICD-10-CM | POA: Diagnosis not present

## 2023-10-14 DIAGNOSIS — S82111D Displaced fracture of right tibial spine, subsequent encounter for closed fracture with routine healing: Secondary | ICD-10-CM | POA: Diagnosis not present

## 2023-10-14 DIAGNOSIS — R32 Unspecified urinary incontinence: Secondary | ICD-10-CM | POA: Diagnosis not present

## 2023-10-15 ENCOUNTER — Telehealth: Payer: Self-pay | Admitting: Nurse Practitioner

## 2023-10-15 NOTE — Telephone Encounter (Signed)
 Received w/c orders from [facility]. Gave to Alyssa for signatures-Toni

## 2023-10-20 DIAGNOSIS — S82101D Unspecified fracture of upper end of right tibia, subsequent encounter for closed fracture with routine healing: Secondary | ICD-10-CM | POA: Diagnosis not present

## 2023-10-21 ENCOUNTER — Telehealth: Payer: Self-pay

## 2023-10-21 DIAGNOSIS — Z90710 Acquired absence of both cervix and uterus: Secondary | ICD-10-CM | POA: Diagnosis not present

## 2023-10-21 DIAGNOSIS — Z9049 Acquired absence of other specified parts of digestive tract: Secondary | ICD-10-CM | POA: Diagnosis not present

## 2023-10-21 DIAGNOSIS — R911 Solitary pulmonary nodule: Secondary | ICD-10-CM | POA: Diagnosis not present

## 2023-10-21 DIAGNOSIS — S82111D Displaced fracture of right tibial spine, subsequent encounter for closed fracture with routine healing: Secondary | ICD-10-CM | POA: Diagnosis not present

## 2023-10-21 DIAGNOSIS — J45909 Unspecified asthma, uncomplicated: Secondary | ICD-10-CM | POA: Diagnosis not present

## 2023-10-21 DIAGNOSIS — Z87891 Personal history of nicotine dependence: Secondary | ICD-10-CM | POA: Diagnosis not present

## 2023-10-21 DIAGNOSIS — M1990 Unspecified osteoarthritis, unspecified site: Secondary | ICD-10-CM | POA: Diagnosis not present

## 2023-10-21 DIAGNOSIS — Z9181 History of falling: Secondary | ICD-10-CM | POA: Diagnosis not present

## 2023-10-21 DIAGNOSIS — Z9842 Cataract extraction status, left eye: Secondary | ICD-10-CM | POA: Diagnosis not present

## 2023-10-21 DIAGNOSIS — Z981 Arthrodesis status: Secondary | ICD-10-CM | POA: Diagnosis not present

## 2023-10-21 DIAGNOSIS — G4733 Obstructive sleep apnea (adult) (pediatric): Secondary | ICD-10-CM | POA: Diagnosis not present

## 2023-10-21 DIAGNOSIS — F419 Anxiety disorder, unspecified: Secondary | ICD-10-CM | POA: Diagnosis not present

## 2023-10-21 DIAGNOSIS — F329 Major depressive disorder, single episode, unspecified: Secondary | ICD-10-CM | POA: Diagnosis not present

## 2023-10-21 DIAGNOSIS — K219 Gastro-esophageal reflux disease without esophagitis: Secondary | ICD-10-CM | POA: Diagnosis not present

## 2023-10-21 DIAGNOSIS — H9193 Unspecified hearing loss, bilateral: Secondary | ICD-10-CM | POA: Diagnosis not present

## 2023-10-21 DIAGNOSIS — G20A1 Parkinson's disease without dyskinesia, without mention of fluctuations: Secondary | ICD-10-CM | POA: Diagnosis not present

## 2023-10-21 DIAGNOSIS — I10 Essential (primary) hypertension: Secondary | ICD-10-CM | POA: Diagnosis not present

## 2023-10-21 DIAGNOSIS — Z9841 Cataract extraction status, right eye: Secondary | ICD-10-CM | POA: Diagnosis not present

## 2023-10-21 DIAGNOSIS — R32 Unspecified urinary incontinence: Secondary | ICD-10-CM | POA: Diagnosis not present

## 2023-10-21 DIAGNOSIS — H547 Unspecified visual loss: Secondary | ICD-10-CM | POA: Diagnosis not present

## 2023-10-21 DIAGNOSIS — E785 Hyperlipidemia, unspecified: Secondary | ICD-10-CM | POA: Diagnosis not present

## 2023-10-21 DIAGNOSIS — K5900 Constipation, unspecified: Secondary | ICD-10-CM | POA: Diagnosis not present

## 2023-10-21 DIAGNOSIS — W19XXXD Unspecified fall, subsequent encounter: Secondary | ICD-10-CM | POA: Diagnosis not present

## 2023-10-22 DIAGNOSIS — W19XXXD Unspecified fall, subsequent encounter: Secondary | ICD-10-CM | POA: Diagnosis not present

## 2023-10-22 DIAGNOSIS — R32 Unspecified urinary incontinence: Secondary | ICD-10-CM | POA: Diagnosis not present

## 2023-10-22 DIAGNOSIS — E785 Hyperlipidemia, unspecified: Secondary | ICD-10-CM | POA: Diagnosis not present

## 2023-10-22 DIAGNOSIS — Z87891 Personal history of nicotine dependence: Secondary | ICD-10-CM | POA: Diagnosis not present

## 2023-10-22 DIAGNOSIS — H547 Unspecified visual loss: Secondary | ICD-10-CM | POA: Diagnosis not present

## 2023-10-22 DIAGNOSIS — K5900 Constipation, unspecified: Secondary | ICD-10-CM | POA: Diagnosis not present

## 2023-10-22 DIAGNOSIS — M1990 Unspecified osteoarthritis, unspecified site: Secondary | ICD-10-CM | POA: Diagnosis not present

## 2023-10-22 DIAGNOSIS — K219 Gastro-esophageal reflux disease without esophagitis: Secondary | ICD-10-CM | POA: Diagnosis not present

## 2023-10-22 DIAGNOSIS — H9193 Unspecified hearing loss, bilateral: Secondary | ICD-10-CM | POA: Diagnosis not present

## 2023-10-22 DIAGNOSIS — J45909 Unspecified asthma, uncomplicated: Secondary | ICD-10-CM | POA: Diagnosis not present

## 2023-10-22 DIAGNOSIS — Z9841 Cataract extraction status, right eye: Secondary | ICD-10-CM | POA: Diagnosis not present

## 2023-10-22 DIAGNOSIS — S82111D Displaced fracture of right tibial spine, subsequent encounter for closed fracture with routine healing: Secondary | ICD-10-CM | POA: Diagnosis not present

## 2023-10-22 DIAGNOSIS — R911 Solitary pulmonary nodule: Secondary | ICD-10-CM | POA: Diagnosis not present

## 2023-10-22 DIAGNOSIS — Z981 Arthrodesis status: Secondary | ICD-10-CM | POA: Diagnosis not present

## 2023-10-22 DIAGNOSIS — F419 Anxiety disorder, unspecified: Secondary | ICD-10-CM | POA: Diagnosis not present

## 2023-10-22 DIAGNOSIS — G4733 Obstructive sleep apnea (adult) (pediatric): Secondary | ICD-10-CM | POA: Diagnosis not present

## 2023-10-22 DIAGNOSIS — Z90710 Acquired absence of both cervix and uterus: Secondary | ICD-10-CM | POA: Diagnosis not present

## 2023-10-22 DIAGNOSIS — Z9842 Cataract extraction status, left eye: Secondary | ICD-10-CM | POA: Diagnosis not present

## 2023-10-22 DIAGNOSIS — G20A1 Parkinson's disease without dyskinesia, without mention of fluctuations: Secondary | ICD-10-CM | POA: Diagnosis not present

## 2023-10-22 DIAGNOSIS — F329 Major depressive disorder, single episode, unspecified: Secondary | ICD-10-CM | POA: Diagnosis not present

## 2023-10-22 DIAGNOSIS — Z9049 Acquired absence of other specified parts of digestive tract: Secondary | ICD-10-CM | POA: Diagnosis not present

## 2023-10-22 DIAGNOSIS — I10 Essential (primary) hypertension: Secondary | ICD-10-CM | POA: Diagnosis not present

## 2023-10-22 DIAGNOSIS — Z9181 History of falling: Secondary | ICD-10-CM | POA: Diagnosis not present

## 2023-10-22 NOTE — Telephone Encounter (Signed)
Sent message to [employer]

## 2023-10-27 ENCOUNTER — Telehealth: Payer: Self-pay | Admitting: Nurse Practitioner

## 2023-10-27 NOTE — Telephone Encounter (Signed)
 Wheelchair order signed. Faxed back to [facility]; [phone]. Scanned-Toni

## 2023-10-28 DIAGNOSIS — F419 Anxiety disorder, unspecified: Secondary | ICD-10-CM | POA: Diagnosis not present

## 2023-10-28 DIAGNOSIS — K5900 Constipation, unspecified: Secondary | ICD-10-CM | POA: Diagnosis not present

## 2023-10-28 DIAGNOSIS — G4733 Obstructive sleep apnea (adult) (pediatric): Secondary | ICD-10-CM | POA: Diagnosis not present

## 2023-10-28 DIAGNOSIS — Z9181 History of falling: Secondary | ICD-10-CM | POA: Diagnosis not present

## 2023-10-28 DIAGNOSIS — Z9841 Cataract extraction status, right eye: Secondary | ICD-10-CM | POA: Diagnosis not present

## 2023-10-28 DIAGNOSIS — H547 Unspecified visual loss: Secondary | ICD-10-CM | POA: Diagnosis not present

## 2023-10-28 DIAGNOSIS — R911 Solitary pulmonary nodule: Secondary | ICD-10-CM | POA: Diagnosis not present

## 2023-10-28 DIAGNOSIS — H9193 Unspecified hearing loss, bilateral: Secondary | ICD-10-CM | POA: Diagnosis not present

## 2023-10-28 DIAGNOSIS — F329 Major depressive disorder, single episode, unspecified: Secondary | ICD-10-CM | POA: Diagnosis not present

## 2023-10-28 DIAGNOSIS — W19XXXD Unspecified fall, subsequent encounter: Secondary | ICD-10-CM | POA: Diagnosis not present

## 2023-10-28 DIAGNOSIS — Z87891 Personal history of nicotine dependence: Secondary | ICD-10-CM | POA: Diagnosis not present

## 2023-10-28 DIAGNOSIS — E785 Hyperlipidemia, unspecified: Secondary | ICD-10-CM | POA: Diagnosis not present

## 2023-10-28 DIAGNOSIS — I10 Essential (primary) hypertension: Secondary | ICD-10-CM | POA: Diagnosis not present

## 2023-10-28 DIAGNOSIS — Z981 Arthrodesis status: Secondary | ICD-10-CM | POA: Diagnosis not present

## 2023-10-28 DIAGNOSIS — K219 Gastro-esophageal reflux disease without esophagitis: Secondary | ICD-10-CM | POA: Diagnosis not present

## 2023-10-28 DIAGNOSIS — G20A1 Parkinson's disease without dyskinesia, without mention of fluctuations: Secondary | ICD-10-CM | POA: Diagnosis not present

## 2023-10-28 DIAGNOSIS — R32 Unspecified urinary incontinence: Secondary | ICD-10-CM | POA: Diagnosis not present

## 2023-10-28 DIAGNOSIS — Z90710 Acquired absence of both cervix and uterus: Secondary | ICD-10-CM | POA: Diagnosis not present

## 2023-10-28 DIAGNOSIS — Z9842 Cataract extraction status, left eye: Secondary | ICD-10-CM | POA: Diagnosis not present

## 2023-10-28 DIAGNOSIS — Z9049 Acquired absence of other specified parts of digestive tract: Secondary | ICD-10-CM | POA: Diagnosis not present

## 2023-10-28 DIAGNOSIS — M1990 Unspecified osteoarthritis, unspecified site: Secondary | ICD-10-CM | POA: Diagnosis not present

## 2023-10-28 DIAGNOSIS — J45909 Unspecified asthma, uncomplicated: Secondary | ICD-10-CM | POA: Diagnosis not present

## 2023-10-28 DIAGNOSIS — S82111D Displaced fracture of right tibial spine, subsequent encounter for closed fracture with routine healing: Secondary | ICD-10-CM | POA: Diagnosis not present

## 2023-11-10 ENCOUNTER — Ambulatory Visit: Admitting: Nurse Practitioner

## 2023-11-12 ENCOUNTER — Encounter: Payer: Self-pay | Admitting: Nurse Practitioner

## 2023-11-12 ENCOUNTER — Ambulatory Visit (INDEPENDENT_AMBULATORY_CARE_PROVIDER_SITE_OTHER): Admitting: Nurse Practitioner

## 2023-11-12 VITALS — BP 147/76 | HR 72 | Temp 98.1°F | Resp 16 | Ht 63.0 in | Wt 145.0 lb

## 2023-11-12 DIAGNOSIS — M150 Primary generalized (osteo)arthritis: Secondary | ICD-10-CM | POA: Diagnosis not present

## 2023-11-12 DIAGNOSIS — E2839 Other primary ovarian failure: Secondary | ICD-10-CM

## 2023-11-12 DIAGNOSIS — G8311 Monoplegia of lower limb affecting right dominant side: Secondary | ICD-10-CM

## 2023-11-12 DIAGNOSIS — G14 Postpolio syndrome: Secondary | ICD-10-CM

## 2023-11-12 DIAGNOSIS — S82101S Unspecified fracture of upper end of right tibia, sequela: Secondary | ICD-10-CM

## 2023-11-12 MED ORDER — HYDROCODONE-ACETAMINOPHEN 10-325 MG PO TABS: 1.0000 | ORAL_TABLET | Freq: Every day | ORAL | 0 refills | Status: AC | PRN

## 2023-11-12 MED ORDER — HYDROCODONE-ACETAMINOPHEN 10-325 MG PO TABS: 1.0000 | ORAL_TABLET | Freq: Every day | ORAL | 0 refills | Status: DC | PRN

## 2023-11-12 NOTE — Progress Notes (Unsigned)
 [facility] [address] [place], [place] [address]  Internal MEDICINE  Office Visit Note  Patient Name: Susan Shah  [id number]  [id number]  Date of Service: 11/12/2023  Chief Complaint  Patient presents with   Depression   Gastroesophageal Reflux   Hypertension   Hyperlipidemia   Follow-up    HPI Zaidy presents for a follow-up visit for chronic pain, dexa scan ordered and recent leg fracture.  Chronic pain due to postpolio syndrome -- takes hydrocodone  as needed.  Recent tibial fracture -- healing and doing well.  Needs new dexa scan. Due to fracture.     Current Medication: Outpatient Encounter Medications as of 11/12/2023  Medication Sig   HYDROcodone -acetaminophen  (NORCO) 10-325 MG tablet Take 1 tablet by mouth daily as needed for severe pain (pain score 7-10).   [START ON 12/10/2023] HYDROcodone -acetaminophen  (NORCO) 10-325 MG tablet Take 1 tablet by mouth daily as needed for severe pain (pain score 7-10).   [START ON 01/14/2024] HYDROcodone -acetaminophen  (NORCO) 10-325 MG tablet Take 1 tablet by mouth daily as needed for severe pain (pain score 7-10).   acetaminophen  (TYLENOL ) 500 MG tablet Take 2 tablets (1,000 mg total) by mouth every 8 (eight) hours as needed for headache or mild pain (pain score 1-3).   amLODipine  (NORVASC ) 2.5 MG tablet TAKE 1 TABLET BY MOUTH EVERY DAY- STOP LISINOPRIL    buPROPion  (WELLBUTRIN  XL) 150 MG 24 hr tablet Take 1 tablet (150 mg total) by mouth daily.   Carbidopa -Levodopa  ER (SINEMET  CR) 25-100 MG tablet controlled release Take 1 tablet by mouth 3 (three) times daily.   Carboxymethylcellul-Glycerin (LUBRICATING EYE DROPS OP) Place 1 drop into both eyes daily as needed (dry eyes).   cholecalciferol  (VITAMIN D3) 25 MCG (1000 UNIT) tablet Take 1,000 Units by mouth daily.   enoxaparin  (LOVENOX ) 40 MG/0.4ML injection Inject 0.4 mLs (40 mg total) into the skin daily for 14 days.   escitalopram  (LEXAPRO ) 20 MG tablet Take 1 tablet  (20 mg total) by mouth daily.   famotidine  (PEPCID ) 40 MG tablet TAKE 1 TABLET(40 MG) BY MOUTH TWICE DAILY (Patient taking differently: Take 40 mg by mouth 2 (two) times daily as needed.)   fluticasone  (FLONASE ) 50 MCG/ACT nasal spray Place 1 spray into both nostrils at bedtime as needed for allergies or rhinitis.   loratadine  (QC LORATADINE  ALLERGY RELIEF) 10 MG tablet Take 1 tablet (10 mg total) by mouth daily.   losartan  (COZAAR ) 100 MG tablet Take 1 tablet (100 mg total) by mouth daily.   metoprolol  tartrate (LOPRESSOR ) 25 MG tablet TAKE 1 TABLET (25 MG TOTAL) BY MOUTH TWICE A DAY WITH MEALS   neomycin -polymyxin b -dexamethasone  (MAXITROL ) 3.5-10000-0.1 SUSP Place 1 drop into both eyes every 6 (six) hours.   ondansetron  (ZOFRAN -ODT) 4 MG disintegrating tablet Take 1 tablet (4 mg total) by mouth every 8 (eight) hours as needed for nausea or vomiting.   oxybutynin  (DITROPAN -XL) 10 MG 24 hr tablet Take 2 tablets (20 mg total) by mouth daily.   pantoprazole  (PROTONIX ) 40 MG tablet Take 1 tablet (40 mg total) by mouth 2 (two) times daily.   rOPINIRole  (REQUIP ) 0.25 MG tablet Take by mouth.   simvastatin  (ZOCOR ) 20 MG tablet TAKE 1 TABLET BY MOUTH DAILY AT 6 PM.   tiZANidine  (ZANAFLEX ) 4 MG tablet Take 4 mg by mouth 3 (three) times daily as needed.   vitamin B-12 (CYANOCOBALAMIN ) 1000 MCG tablet Take 1,000 mcg by mouth daily.   [DISCONTINUED] HYDROcodone -acetaminophen  (NORCO/VICODIN) 5-325 MG tablet Take 1 tablet by  mouth daily as needed for moderate pain (pain score 4-6) or severe pain (pain score 7-10).   [DISCONTINUED] HYDROcodone -acetaminophen  (NORCO/VICODIN) 5-325 MG tablet Take 1 tablet by mouth daily as needed for moderate pain (pain score 4-6) or severe pain (pain score 7-10).   [DISCONTINUED] HYDROcodone -acetaminophen  (NORCO/VICODIN) 5-325 MG tablet Take 1 tablet by mouth daily as needed for moderate pain (pain score 4-6) or severe pain (pain score 7-10).   No facility-administered encounter  medications on file as of 11/12/2023.    Surgical History: Past Surgical History:  Procedure Laterality Date   29 HOUR PH STUDY N/A 04/24/2016   Procedure: 24 HOUR PH STUDY;  Surgeon: Rogelia Copping, MD;  Location: [facility];  Service: Endoscopy;  Laterality: N/A;   ABDOMINAL HYSTERECTOMY     Total   APPENDECTOMY     CARPOMETACARPAL (CMC) FUSION OF THUMB Left 05/16/2016   Procedure: CARPOMETACARPAL [facility]) FUSION OF THUMB;  Surgeon: Ozell Flake, MD;  Location: [facility] ORS;  Service: Orthopedics;  Laterality: Left;   CARPOMETACARPAL (CMC) FUSION OF THUMB Left 03/30/2019   Procedure: LEFT THUMB SUSPENSION PLASTY;  Surgeon: Flake Ozell, MD;  Location: [facility] ORS;  Service: Orthopedics;  Laterality: Left;   CATARACT EXTRACTION Bilateral 12/2012   CHOLECYSTECTOMY     COLONOSCOPY  2003   ESOPHAGEAL MANOMETRY N/A 04/24/2016   Procedure: ESOPHAGEAL MANOMETRY (EM);  Surgeon: Rogelia Copping, MD;  Location: [facility];  Service: Endoscopy;  Laterality: N/A;   ESOPHAGOGASTRODUODENOSCOPY  08/2011   FOOT SURGERY     HALLUX VALGUS CORRECTION     HIP SURGERY Right 1950   tendon release r/t polio   KNEE ARTHROSCOPY Left 06/23/2008   RETINAL DETACHMENT SURGERY Right 03/2014   ROTATOR CUFF REPAIR Bilateral    URINARY SURGERY  2014   [place]     Medical History: Past Medical History:  Diagnosis Date   Anxiety    Asthma 2015   mild, seasonal allergy triggered.   Cancer (HCC) 2013   skin cancer  on left hand   Depression    Esophageal dysmotility    GERD (gastroesophageal reflux disease) 11/05/2012   History of hiatal hernia    Hyperlipidemia    Hypertension    Incontinence of urine    Lung nodule    OA (osteoarthritis)    Obesity    Parkinson's disease (HCC)    PONV (postoperative nausea and vomiting)    Post-polio syndrome (HCC)    contracted at 58 months old   Pulmonary nodule 11/05/2012   RML   Shingles 2009   Sleep apnea     Family History: Family History  Problem Relation Age  of Onset   Heart disease Mother    Heart disease Father    Heart attack Sister    Heart disease Brother    Heart attack Brother    Breast cancer Maternal Aunt    Stroke Maternal Grandmother    Heart disease Maternal Grandfather     Social History   Socioeconomic History   Marital status: Married    Spouse name: Not on file   Number of children: Not on file   Years of education: Not on file   Highest education level: Not on file  Occupational History   Not on file  Tobacco Use   Smoking status: Former    Current packs/day: 0.00    Types: Cigarettes    Quit date: 05/09/1968    Years since quitting: 55.6   Smokeless tobacco: Never  Vaping Use   Vaping status:  Never Used  Substance and Sexual Activity   Alcohol  use: No   Drug use: No   Sexual activity: Not on file  Other Topics Concern   Not on file  Social History Narrative   Not on file   Social Drivers of Health   Financial Resource Strain: Medium Risk (10/20/2023)   Received from [facility]   Overall Financial Resource Strain (CARDIA)    Difficulty of Paying Living Expenses: Somewhat hard  Food Insecurity: Food Insecurity Present (10/20/2023)   Received from [facility]   Hunger Vital Sign    Within the past 12 months, you worried that your food would run out before you got the money to buy more.: Sometimes true    Within the past 12 months, the food you bought just didn't last and you didn't have money to get more.: Never true  Transportation Needs: No Transportation Needs (10/20/2023)   Received from [facility] - Transportation    In the past 12 months, has lack of transportation kept you from medical appointments or from getting medications?: No    Lack of Transportation (Non-Medical): No  Physical Activity: Not on file  Stress: Not on file  Social Connections: Patient Unable To Answer (08/10/2023)   Social Connection and Isolation Panel     Frequency of Communication with Friends and Family: Patient unable to answer    Frequency of Social Gatherings with Friends and Family: Patient unable to answer    Attends Religious Services: Patient unable to answer    Active Member of Clubs or Organizations: Patient unable to answer    Attends Banker Meetings: Patient unable to answer    Marital Status: Patient unable to answer  Intimate Partner Violence: Unknown (08/10/2023)   Humiliation, Afraid, Rape, and Kick questionnaire    Fear of Current or Ex-Partner: No    Emotionally Abused: Patient declined    Physically Abused: No    Sexually Abused: No      Review of Systems  Constitutional:  Negative for appetite change, chills, fatigue and unexpected weight change.  HENT:  Negative for congestion, rhinorrhea, sneezing and sore throat.   Eyes:  Negative for redness.  Respiratory: Negative.  Negative for cough, chest tightness, shortness of breath and wheezing.   Cardiovascular: Negative.  Negative for chest pain and palpitations.  Gastrointestinal:  Negative for abdominal pain, constipation, diarrhea and nausea.  Genitourinary:  Negative for dysuria and frequency.  Musculoskeletal:  Positive for arthralgias and gait problem. Negative for back pain, joint swelling and neck pain.  Skin:  Negative for rash.  Neurological:  Negative for tremors and numbness.  Hematological:  Negative for adenopathy. Does not bruise/bleed easily.  Psychiatric/Behavioral:  Negative for behavioral problems (Depression), sleep disturbance and suicidal ideas. The patient is not nervous/anxious.     Vital Signs: BP (!) 147/76   Pulse 72   Temp 98.1 F (36.7 C)   Resp 16   Ht 5' 3 (1.6 m)   Wt 145 lb (65.8 kg)   SpO2 94%   BMI 25.69 kg/m    Physical Exam Vitals reviewed.  Constitutional:      General: She is not in acute distress.    Appearance: Normal appearance. She is not ill-appearing.  HENT:     Head: Normocephalic and  atraumatic.  Eyes:     Pupils: Pupils are equal, round, and reactive to light.  Cardiovascular:     Rate and Rhythm:  Normal rate and regular rhythm.  Pulmonary:     Effort: Pulmonary effort is normal. No respiratory distress.  Neurological:     Mental Status: She is alert and oriented to person, place, and time.     Gait: Gait abnormal.  Psychiatric:        Mood and Affect: Mood normal.        Behavior: Behavior normal.        Assessment/Plan: 1. Primary osteoarthritis involving multiple joints (Primary) Continue prn hydrocodone  as prescribed. Follow up in 3 months for additional refills.  - HYDROcodone -acetaminophen  (NORCO) 10-325 MG tablet; Take 1 tablet by mouth daily as needed for severe pain (pain score 7-10).  Dispense: 30 tablet; Refill: 0 - HYDROcodone -acetaminophen  (NORCO) 10-325 MG tablet; Take 1 tablet by mouth daily as needed for severe pain (pain score 7-10).  Dispense: 30 tablet; Refill: 0 - HYDROcodone -acetaminophen  (NORCO) 10-325 MG tablet; Take 1 tablet by mouth daily as needed for severe pain (pain score 7-10).  Dispense: 30 tablet; Refill: 0  2. Closed fracture of proximal end of right tibia, unspecified fracture morphology, sequela Dexa scan ordered   3. Post-polio syndrome (HCC) Continue prn hydrocodone  as prescribed. Follow up in 3 months for additional refills.  - HYDROcodone -acetaminophen  (NORCO) 10-325 MG tablet; Take 1 tablet by mouth daily as needed for severe pain (pain score 7-10).  Dispense: 30 tablet; Refill: 0 - HYDROcodone -acetaminophen  (NORCO) 10-325 MG tablet; Take 1 tablet by mouth daily as needed for severe pain (pain score 7-10).  Dispense: 30 tablet; Refill: 0 - HYDROcodone -acetaminophen  (NORCO) 10-325 MG tablet; Take 1 tablet by mouth daily as needed for severe pain (pain score 7-10).  Dispense: 30 tablet; Refill: 0  4. Ovarian failure due to menopause Dexa scan ordered   5. Monoplegia of right leg (HCC) Due to prior polio infection years  ago.     General Counseling: Ineze verbalizes understanding of the findings of todays visit and agrees with plan of treatment. I have discussed any further diagnostic evaluation that may be needed or ordered today. We also reviewed her medications today. she has been encouraged to call the office with any questions or concerns that should arise related to todays visit.    Orders Placed This Encounter  Procedures   DG Bone Density    Meds ordered this encounter  Medications   HYDROcodone -acetaminophen  (NORCO) 10-325 MG tablet    Sig: Take 1 tablet by mouth daily as needed for severe pain (pain score 7-10).    Dispense:  30 tablet    Refill:  0    Fill script today, Chronic pain medication, not new prescription just increase in dose   HYDROcodone -acetaminophen  (NORCO) 10-325 MG tablet    Sig: Take 1 tablet by mouth daily as needed for severe pain (pain score 7-10).    Dispense:  30 tablet    Refill:  0    Refill for November Chronic pain medication, not new prescription just increase in dose   HYDROcodone -acetaminophen  (NORCO) 10-325 MG tablet    Sig: Take 1 tablet by mouth daily as needed for severe pain (pain score 7-10).    Dispense:  30 tablet    Refill:  0    Refill for december Chronic pain medication    Return in about 4 months (around 03/14/2024) for F/U, pain med refill, Pat Elicker PCP.   Total time spent:30 Minutes Time spent includes review of chart, medications, test results, and follow up plan with the patient.   [place] Controlled Substance Database  was reviewed by me.  This patient was seen by Mardy Maxin, FNP-C in collaboration with Dr. Sigrid Bathe as a part of collaborative care agreement.   Sheppard Luckenbach R. Maxin, MSN, FNP-C Internal medicine

## 2023-11-21 DIAGNOSIS — M461 Sacroiliitis, not elsewhere classified: Secondary | ICD-10-CM | POA: Diagnosis not present

## 2023-11-21 DIAGNOSIS — K219 Gastro-esophageal reflux disease without esophagitis: Secondary | ICD-10-CM | POA: Diagnosis not present

## 2023-11-24 ENCOUNTER — Telehealth: Payer: Self-pay

## 2023-11-24 NOTE — Telephone Encounter (Signed)
 [name], NP with [employer], completed a bone density scan on patient with abnormal findings. Requested that the report be faxed over for [facility] to review, provided fax number as well.

## 2023-11-27 ENCOUNTER — Telehealth: Payer: Self-pay | Admitting: Nurse Practitioner

## 2023-11-27 NOTE — Telephone Encounter (Signed)
 Received confirmation from [employer] clinical review in regards to dexa being ordered during 11/12/23 visit. Gave to Alyssa-Toni

## 2023-12-01 ENCOUNTER — Other Ambulatory Visit

## 2023-12-09 ENCOUNTER — Encounter: Payer: Self-pay | Admitting: Nurse Practitioner

## 2023-12-10 ENCOUNTER — Telehealth: Payer: Self-pay

## 2023-12-10 NOTE — Telephone Encounter (Signed)
 Spoke with as per pt that her insurance house call they did at home they will fax us  report

## 2023-12-22 ENCOUNTER — Encounter

## 2023-12-29 ENCOUNTER — Telehealth: Payer: Self-pay

## 2024-01-01 ENCOUNTER — Telehealth: Payer: Self-pay | Admitting: Nurse Practitioner

## 2024-01-01 NOTE — Telephone Encounter (Signed)
 MR emailed & faxed to [facility] w/ [employer]-Toni

## 2024-01-08 ENCOUNTER — Other Ambulatory Visit: Payer: Self-pay

## 2024-01-08 ENCOUNTER — Telehealth: Payer: Self-pay

## 2024-01-08 MED ORDER — AZITHROMYCIN 250 MG PO TABS: ORAL_TABLET | ORAL | 0 refills | Status: AC

## 2024-01-08 NOTE — Telephone Encounter (Signed)
 Per Alyssa to send patient zpak, patient notified.

## 2024-01-12 NOTE — Telephone Encounter (Signed)
 done

## 2024-01-13 ENCOUNTER — Other Ambulatory Visit: Payer: Self-pay | Admitting: Nurse Practitioner

## 2024-01-13 DIAGNOSIS — K219 Gastro-esophageal reflux disease without esophagitis: Secondary | ICD-10-CM

## 2024-02-12 ENCOUNTER — Telehealth: Payer: Self-pay

## 2024-02-12 DIAGNOSIS — G14 Postpolio syndrome: Secondary | ICD-10-CM

## 2024-02-12 DIAGNOSIS — M150 Primary generalized (osteo)arthritis: Secondary | ICD-10-CM

## 2024-02-13 MED ORDER — HYDROCODONE-ACETAMINOPHEN 10-325 MG PO TABS: 1.0000 | ORAL_TABLET | Freq: Every day | ORAL | 0 refills | Status: AC | PRN

## 2024-02-16 NOTE — Telephone Encounter (Signed)
 Med sent.

## 2024-03-15 ENCOUNTER — Ambulatory Visit: Admitting: Nurse Practitioner

## 2024-03-25 ENCOUNTER — Ambulatory Visit: Admitting: Nurse Practitioner
